# Patient Record
Sex: Male | Born: 1955 | Race: White | Hispanic: No | Smoking: Former smoker
Health system: Southern US, Community
[De-identification: ages and names within clinical notes are randomized; demographics above are authoritative.]

## PROBLEM LIST (undated history)

## (undated) DIAGNOSIS — M1990 Unspecified osteoarthritis, unspecified site: Secondary | ICD-10-CM

## (undated) DIAGNOSIS — C801 Malignant (primary) neoplasm, unspecified: Secondary | ICD-10-CM

## (undated) DIAGNOSIS — K219 Gastro-esophageal reflux disease without esophagitis: Secondary | ICD-10-CM

## (undated) DIAGNOSIS — I10 Essential (primary) hypertension: Secondary | ICD-10-CM

## (undated) DIAGNOSIS — G4730 Sleep apnea, unspecified: Secondary | ICD-10-CM

## (undated) DIAGNOSIS — E119 Type 2 diabetes mellitus without complications: Secondary | ICD-10-CM

## (undated) DIAGNOSIS — Z87442 Personal history of urinary calculi: Secondary | ICD-10-CM

## (undated) DIAGNOSIS — E7800 Pure hypercholesterolemia, unspecified: Secondary | ICD-10-CM

## (undated) HISTORY — PX: SHOULDER SURGERY: SHX246

## (undated) HISTORY — PX: ADENOIDECTOMY: SUR15

## (undated) HISTORY — PX: TONSILLECTOMY: SUR1361

## (undated) HISTORY — PX: KNEE SURGERY: SHX244

## (undated) HISTORY — DX: Type 2 diabetes mellitus without complications: E11.9

## (undated) HISTORY — PX: APPENDECTOMY: SHX54

---

## 2004-04-14 ENCOUNTER — Ambulatory Visit (HOSPITAL_COMMUNITY): Admission: RE | Admit: 2004-04-14 | Discharge: 2004-04-14 | Payer: Self-pay | Admitting: Family Medicine

## 2005-06-15 ENCOUNTER — Ambulatory Visit (HOSPITAL_COMMUNITY): Admission: RE | Admit: 2005-06-15 | Discharge: 2005-06-15 | Payer: Self-pay | Admitting: Family Medicine

## 2006-04-23 ENCOUNTER — Ambulatory Visit: Payer: Self-pay | Admitting: Orthopedic Surgery

## 2006-05-03 ENCOUNTER — Ambulatory Visit (HOSPITAL_COMMUNITY): Admission: RE | Admit: 2006-05-03 | Discharge: 2006-05-03 | Payer: Self-pay | Admitting: Orthopedic Surgery

## 2006-05-07 ENCOUNTER — Ambulatory Visit: Payer: Self-pay | Admitting: Orthopedic Surgery

## 2007-05-13 ENCOUNTER — Emergency Department (HOSPITAL_COMMUNITY): Admission: EM | Admit: 2007-05-13 | Discharge: 2007-05-13 | Payer: Self-pay | Admitting: Emergency Medicine

## 2007-07-18 ENCOUNTER — Ambulatory Visit (HOSPITAL_COMMUNITY): Admission: RE | Admit: 2007-07-18 | Discharge: 2007-07-18 | Payer: Self-pay | Admitting: Family Medicine

## 2008-02-15 ENCOUNTER — Emergency Department (HOSPITAL_COMMUNITY): Admission: EM | Admit: 2008-02-15 | Discharge: 2008-02-15 | Payer: Self-pay | Admitting: Emergency Medicine

## 2008-02-16 ENCOUNTER — Emergency Department (HOSPITAL_COMMUNITY): Admission: EM | Admit: 2008-02-16 | Discharge: 2008-02-16 | Payer: Self-pay | Admitting: Emergency Medicine

## 2008-02-18 ENCOUNTER — Emergency Department (HOSPITAL_COMMUNITY): Admission: EM | Admit: 2008-02-18 | Discharge: 2008-02-19 | Payer: Self-pay | Admitting: Emergency Medicine

## 2008-03-12 ENCOUNTER — Ambulatory Visit (HOSPITAL_COMMUNITY): Admission: RE | Admit: 2008-03-12 | Discharge: 2008-03-12 | Payer: Self-pay | Admitting: Family Medicine

## 2009-01-10 ENCOUNTER — Emergency Department (HOSPITAL_COMMUNITY): Admission: EM | Admit: 2009-01-10 | Discharge: 2009-01-10 | Payer: Self-pay | Admitting: Emergency Medicine

## 2011-01-08 ENCOUNTER — Encounter: Payer: Self-pay | Admitting: Family Medicine

## 2011-09-08 LAB — DIFFERENTIAL
Basophils Absolute: 0
Basophils Relative: 0
Eosinophils Absolute: 0
Eosinophils Relative: 0
Lymphocytes Relative: 11 — ABNORMAL LOW
Lymphs Abs: 1.1
Monocytes Absolute: 0.9
Monocytes Relative: 9
Neutro Abs: 8.2 — ABNORMAL HIGH
Neutrophils Relative %: 80 — ABNORMAL HIGH

## 2011-09-08 LAB — URINALYSIS, ROUTINE W REFLEX MICROSCOPIC
Bilirubin Urine: NEGATIVE
Glucose, UA: NEGATIVE
Ketones, ur: >80 — AB
Leukocytes, UA: NEGATIVE
Nitrite: NEGATIVE
Protein, ur: 30 — AB
Specific Gravity, Urine: 1.025
Urobilinogen, UA: 0.2
pH: 6.5

## 2011-09-08 LAB — BASIC METABOLIC PANEL
BUN: 14
CO2: 28
Calcium: 9.3
Chloride: 109
Creatinine, Ser: 1.26
GFR calc Af Amer: >60
GFR calc non Af Amer: >60
Glucose, Bld: 107 — ABNORMAL HIGH
Potassium: 4.3
Sodium: 141

## 2011-09-08 LAB — CBC
HCT: 38.9 — ABNORMAL LOW
Hemoglobin: 13.6
MCHC: 35
MCV: 87.4
Platelets: 217
RBC: 4.45
RDW: 14.3
WBC: 10.2

## 2011-09-08 LAB — URINE MICROSCOPIC-ADD ON

## 2011-09-11 LAB — CBC
HCT: 32.2 — ABNORMAL LOW
Hemoglobin: 11.2 — ABNORMAL LOW
MCHC: 34.9
MCV: 87.3
Platelets: 198
RBC: 3.69 — ABNORMAL LOW
RDW: 14.1
WBC: 8.9

## 2011-09-11 LAB — DIFFERENTIAL
Basophils Absolute: 0
Basophils Relative: 0
Eosinophils Absolute: 0.2
Eosinophils Relative: 3
Lymphocytes Relative: 17
Lymphs Abs: 1.5
Monocytes Absolute: 1.1 — ABNORMAL HIGH
Monocytes Relative: 12
Neutro Abs: 6
Neutrophils Relative %: 68

## 2011-09-11 LAB — BASIC METABOLIC PANEL
BUN: 11
CO2: 27
Calcium: 8.8
Chloride: 99
Creatinine, Ser: 1.17
GFR calc Af Amer: >60
GFR calc non Af Amer: >60
Glucose, Bld: 90
Potassium: 3.9
Sodium: 133 — ABNORMAL LOW

## 2012-02-27 ENCOUNTER — Emergency Department (HOSPITAL_COMMUNITY)
Admission: EM | Admit: 2012-02-27 | Discharge: 2012-02-28 | Disposition: A | Discharge status: Home / Self Care | Attending: Emergency Medicine | Admitting: Emergency Medicine

## 2012-02-27 ENCOUNTER — Encounter (HOSPITAL_COMMUNITY): Payer: Self-pay | Admitting: Emergency Medicine

## 2012-02-27 DIAGNOSIS — N200 Calculus of kidney: Secondary | ICD-10-CM

## 2012-02-27 DIAGNOSIS — N201 Calculus of ureter: Secondary | ICD-10-CM | POA: Insufficient documentation

## 2012-02-27 DIAGNOSIS — R112 Nausea with vomiting, unspecified: Secondary | ICD-10-CM | POA: Insufficient documentation

## 2012-02-27 DIAGNOSIS — R109 Unspecified abdominal pain: Secondary | ICD-10-CM | POA: Insufficient documentation

## 2012-02-27 MED ORDER — HYDROMORPHONE HCL PF 1 MG/ML IJ SOLN
1.0000 mg | Freq: Once | INTRAMUSCULAR | Status: AC
  Administered 2012-02-27: 1 mg via INTRAVENOUS
  Filled 2012-02-27: qty 1

## 2012-02-27 MED ORDER — ONDANSETRON HCL 4 MG/2ML IJ SOLN
4.0000 mg | Freq: Once | INTRAMUSCULAR | Status: AC
  Administered 2012-02-27: 4 mg via INTRAVENOUS
  Filled 2012-02-27: qty 2

## 2012-02-27 NOTE — ED Notes (Signed)
Pt with left flank pain x 2 hours ago

## 2012-02-27 NOTE — ED Provider Notes (Signed)
History     CSN: [id number]  Arrival date & time 02/27/12  2206   First MD Initiated Contact with Patient 02/27/12 2316      Chief Complaint  Patient presents with  . Flank Pain     Patient is a 56 y.o. male presenting with flank pain. The history is provided by the patient.  Flank Pain This is a new problem. The current episode started 1 to 2 hours ago. The problem occurs constantly. The problem has been gradually worsening. Associated symptoms include abdominal pain. Pertinent negatives include no chest pain, no headaches and no shortness of breath. The symptoms are aggravated by nothing. The symptoms are relieved by nothing.  PT presents with left flank pain with nausea/vomiting that started about 2 hrs ago He reports similar to previous h/o kidney stones in the past.  He reports he did require stent placement for previous kidney stone He has no other complaints Nothing has improved his symptoms No cp/sob reported   Past Medical History  Diagnosis Date  . Kidney calculi   . Migraine     History reviewed. No pertinent past surgical history.  No family history on file.  History  Substance Use Topics  . Smoking status: Never Smoker   . Smokeless tobacco: Not on file  . Alcohol Use: No      Review of Systems  Respiratory: Negative for shortness of breath.   Cardiovascular: Negative for chest pain.  Gastrointestinal: Positive for abdominal pain.  Genitourinary: Positive for flank pain.  Neurological: Negative for headaches.  All other systems reviewed and are negative.    Allergies  Review of patient's allergies indicates no known allergies.  Home Medications  No current outpatient prescriptions on file.  BP 123/63  Pulse 76  Temp(Src) 97.1 F (36.2 C) (Oral)  Resp 18  Ht 5\' 6"  (1.676 m)  Wt 200 lb (90.719 kg)  BMI 32.28 kg/m2  SpO2 100%  Physical Exam CONSTITUTIONAL: Well developed/well nourished, uncomfortable appearing HEAD AND FACE:  Normocephalic/atraumatic EYES: EOMI/PERRL ENMT: Mucous membranes moist NECK: supple no meningeal signs SPINE:entire spine nontender CV: S1/S2 noted, no murmurs/rubs/gallops noted LUNGS: Lungs are clear to auscultation bilaterally, no apparent distress ABDOMEN: soft, nontender, no rebound or guarding FA:OZHY CVA tenderness, no hernia noted, chaperone present NEURO: Pt is awake/alert, moves all extremitiesx4 EXTREMITIES: pulses normal, full ROM SKIN: warm, color normal PSYCH: no abnormalities of mood noted   ED Course  Procedures    Labs Reviewed  URINALYSIS, ROUTINE W REFLEX MICROSCOPIC  CBC  DIFFERENTIAL  BASIC METABOLIC PANEL   86:57 PM Pt with h/o kidney stones, now presenting with pain similar to prior episodes Labs/urinalysis ordered will treat pain   Patient signed out to dr Freida Busman pending CT imaging  MDM  Nursing notes reviewed and considered in documentation Previous records reviewed and considered All labs/vitals reviewed and considered         Joya Gaskins, MD 02/29/12 1659

## 2012-02-28 ENCOUNTER — Emergency Department (HOSPITAL_COMMUNITY)

## 2012-02-28 LAB — BASIC METABOLIC PANEL
BUN: 20 mg/dL (ref 6–23)
CO2: 24 mEq/L (ref 19–32)
Calcium: 10.1 mg/dL (ref 8.4–10.5)
Chloride: 103 mEq/L (ref 96–112)
Creatinine, Ser: 1.19 mg/dL (ref 0.50–1.35)
GFR calc Af Amer: 77 mL/min — ABNORMAL LOW (ref >90)
GFR calc non Af Amer: 67 mL/min — ABNORMAL LOW (ref >90)
Glucose, Bld: 150 mg/dL — ABNORMAL HIGH (ref 70–99)
Potassium: 3.7 mEq/L (ref 3.5–5.1)
Sodium: 138 mEq/L (ref 135–145)

## 2012-02-28 LAB — CBC
HCT: 42.7 % (ref 39.0–52.0)
Hemoglobin: 14.5 g/dL (ref 13.0–17.0)
MCH: 30 pg (ref 26.0–34.0)
MCHC: 34 g/dL (ref 30.0–36.0)
MCV: 88.2 fL (ref 78.0–100.0)
Platelets: 216 10*3/uL (ref 150–400)
RBC: 4.84 MIL/uL (ref 4.22–5.81)
RDW: 15 % (ref 11.5–15.5)
WBC: 9.9 10*3/uL (ref 4.0–10.5)

## 2012-02-28 LAB — DIFFERENTIAL
Basophils Absolute: 0 10*3/uL (ref 0.0–0.1)
Basophils Relative: 0 % (ref 0–1)
Eosinophils Absolute: 0.4 10*3/uL (ref 0.0–0.7)
Eosinophils Relative: 4 % (ref 0–5)
Lymphocytes Relative: 24 % (ref 12–46)
Lymphs Abs: 2.4 10*3/uL (ref 0.7–4.0)
Monocytes Absolute: 0.8 10*3/uL (ref 0.1–1.0)
Monocytes Relative: 8 % (ref 3–12)
Neutro Abs: 6.3 10*3/uL (ref 1.7–7.7)
Neutrophils Relative %: 64 % (ref 43–77)

## 2012-02-28 LAB — URINALYSIS, ROUTINE W REFLEX MICROSCOPIC
Bilirubin Urine: NEGATIVE
Glucose, UA: NEGATIVE mg/dL
Ketones, ur: NEGATIVE mg/dL
Leukocytes, UA: NEGATIVE
Nitrite: NEGATIVE
Protein, ur: NEGATIVE mg/dL
Specific Gravity, Urine: 1.015 (ref 1.005–1.030)
Urobilinogen, UA: 0.2 mg/dL (ref 0.0–1.0)
pH: 7 (ref 5.0–8.0)

## 2012-02-28 LAB — URINE MICROSCOPIC-ADD ON

## 2012-02-28 MED ORDER — TAMSULOSIN HCL 0.4 MG PO CAPS: 0.4000 mg | ORAL_CAPSULE | Freq: Every day | ORAL | Status: DC

## 2012-02-28 MED ORDER — ONDANSETRON HCL 4 MG PO TABS: 4.0000 mg | ORAL_TABLET | Freq: Four times a day (QID) | ORAL | Status: AC

## 2012-02-28 MED ORDER — HYDROMORPHONE HCL 2 MG PO TABS: 2.0000 mg | ORAL_TABLET | ORAL | Status: AC | PRN

## 2012-02-28 MED ORDER — HYDROMORPHONE HCL PF 1 MG/ML IJ SOLN
0.5000 mg | Freq: Once | INTRAMUSCULAR | Status: AC
  Administered 2012-02-28: 0.5 mg via INTRAVENOUS
  Filled 2012-02-28: qty 1

## 2012-02-28 NOTE — Discharge Instructions (Signed)
You have a 5 mm kidney stone on the left side causing moderate obstruction. Call your urologist today to schedule a followup visit Kidney Stones Kidney stones (ureteral lithiasis) are deposits that form inside your kidneys. The intense pain is caused by the stone moving through the urinary tract. When the stone moves, the ureter goes into spasm around the stone. The stone is usually passed in the urine.  CAUSES   A disorder that makes certain neck glands produce too much parathyroid hormone (primary hyperparathyroidism).   A buildup of uric acid crystals.   Narrowing (stricture) of the ureter.   A kidney obstruction present at birth (congenital obstruction).   Previous surgery on the kidney or ureters.   Numerous kidney infections.  SYMPTOMS   Feeling sick to your stomach (nauseous).   Throwing up (vomiting).   Blood in the urine (hematuria).   Pain that usually spreads (radiates) to the groin.   Frequency or urgency of urination.  DIAGNOSIS   Taking a history and physical exam.   Blood or urine tests.   Computerized X-ray scan (CT scan).   Occasionally, an examination of the inside of the urinary bladder (cystoscopy) is performed.  TREATMENT   Observation.   Increasing your fluid intake.   Surgery may be needed if you have severe pain or persistent obstruction.  The size, location, and chemical composition are all important variables that will determine the proper choice of action for you. Talk to your caregiver to better understand your situation so that you will minimize the risk of injury to yourself and your kidney.  HOME CARE INSTRUCTIONS   Drink enough water and fluids to keep your urine clear or pale yellow.   Strain all urine through the provided strainer. Keep all particulate matter and stones for your caregiver to see. The stone causing the pain may be as small as a grain of salt. It is very important to use the strainer each and every time you pass your  urine. The collection of your stone will allow your caregiver to analyze it and verify that a stone has actually passed.   Only take over-the-counter or prescription medicines for pain, discomfort, or fever as directed by your caregiver.   Make a follow-up appointment with your caregiver as directed.   Get follow-up X-rays if required. The absence of pain does not always mean that the stone has passed. It may have only stopped moving. If the urine remains completely obstructed, it can cause loss of kidney function or even complete destruction of the kidney. It is your responsibility to make sure X-rays and follow-ups are completed. Ultrasounds of the kidney can show blockages and the status of the kidney. Ultrasounds are not associated with any radiation and can be performed easily in a matter of minutes.  SEEK IMMEDIATE MEDICAL CARE IF:   Pain cannot be controlled with the prescribed medicine.   You have a fever.   The severity or intensity of pain increases over 18 hours and is not relieved by pain medicine.   You develop a new onset of abdominal pain.   You feel faint or pass out.  MAKE SURE YOU:   Understand these instructions.   Will watch your condition.   Will get help right away if you are not doing well or get worse.  Document Released: 12/04/2005 Document Revised: 11/23/2011 Document Reviewed: 04/01/2010 [facility] Patient Information 2012 [place], [place].

## 2012-02-28 NOTE — ED Provider Notes (Signed)
Patient signed out to me by Dr. Bebe Shaggy. Patient CT reviewed and kidney stone noted. Patient's pain is controlled this time he'll be discharged with opiate pain medication and will followup with his urologist  Toy Baker, MD 02/28/12 [phone]

## 2012-05-23 ENCOUNTER — Other Ambulatory Visit (HOSPITAL_COMMUNITY): Payer: Self-pay | Admitting: Family Medicine

## 2012-05-23 ENCOUNTER — Ambulatory Visit (HOSPITAL_COMMUNITY)
Admission: RE | Admit: 2012-05-23 | Discharge: 2012-05-23 | Disposition: A | Discharge status: Home / Self Care | Source: Ambulatory Visit | Attending: Family Medicine | Admitting: Family Medicine

## 2012-05-23 DIAGNOSIS — M25519 Pain in unspecified shoulder: Secondary | ICD-10-CM | POA: Insufficient documentation

## 2012-05-23 DIAGNOSIS — R52 Pain, unspecified: Secondary | ICD-10-CM

## 2013-04-20 ENCOUNTER — Emergency Department (HOSPITAL_COMMUNITY)

## 2013-04-20 ENCOUNTER — Inpatient Hospital Stay (HOSPITAL_COMMUNITY)
Admission: EM | Admit: 2013-04-20 | Discharge: 2013-04-29 | DRG: 981 | Disposition: A | Discharge status: Other Acute Inpatient Hospital | Attending: General Surgery | Admitting: General Surgery

## 2013-04-20 ENCOUNTER — Encounter (HOSPITAL_COMMUNITY): Payer: Self-pay | Admitting: Anesthesiology

## 2013-04-20 ENCOUNTER — Encounter (HOSPITAL_COMMUNITY)
Admission: EM | Disposition: A | Discharge status: Nursing Home (Includes ICF) | Payer: Self-pay | Source: Home / Self Care | Attending: General Surgery

## 2013-04-20 ENCOUNTER — Encounter (HOSPITAL_COMMUNITY): Payer: Self-pay

## 2013-04-20 ENCOUNTER — Emergency Department (HOSPITAL_COMMUNITY): Admitting: Anesthesiology

## 2013-04-20 DIAGNOSIS — Z87442 Personal history of urinary calculi: Secondary | ICD-10-CM

## 2013-04-20 DIAGNOSIS — N493 Fournier gangrene: Secondary | ICD-10-CM

## 2013-04-20 DIAGNOSIS — A419 Sepsis, unspecified organism: Secondary | ICD-10-CM

## 2013-04-20 DIAGNOSIS — N501 Vascular disorders of male genital organs: Principal | ICD-10-CM | POA: Diagnosis present

## 2013-04-20 DIAGNOSIS — Z79899 Other long term (current) drug therapy: Secondary | ICD-10-CM

## 2013-04-20 DIAGNOSIS — E669 Obesity, unspecified: Secondary | ICD-10-CM | POA: Diagnosis present

## 2013-04-20 DIAGNOSIS — L0501 Pilonidal cyst with abscess: Secondary | ICD-10-CM | POA: Diagnosis present

## 2013-04-20 DIAGNOSIS — F17200 Nicotine dependence, unspecified, uncomplicated: Secondary | ICD-10-CM | POA: Diagnosis present

## 2013-04-20 DIAGNOSIS — G43909 Migraine, unspecified, not intractable, without status migrainosus: Secondary | ICD-10-CM | POA: Diagnosis present

## 2013-04-20 DIAGNOSIS — M726 Necrotizing fasciitis: Secondary | ICD-10-CM | POA: Diagnosis present

## 2013-04-20 DIAGNOSIS — N179 Acute kidney failure, unspecified: Secondary | ICD-10-CM | POA: Diagnosis present

## 2013-04-20 HISTORY — PX: INCISION AND DRAINAGE PERIRECTAL ABSCESS: SHX1804

## 2013-04-20 HISTORY — PX: CENTRAL VENOUS CATHETER INSERTION: SHX401

## 2013-04-20 LAB — POCT I-STAT, CHEM 8
BUN: 41 mg/dL — ABNORMAL HIGH (ref 6–23)
Calcium, Ion: 1.24 mmol/L — ABNORMAL HIGH (ref 1.12–1.23)
Chloride: 108 mEq/L (ref 96–112)
Creatinine, Ser: 2.6 mg/dL — ABNORMAL HIGH (ref 0.50–1.35)
Glucose, Bld: 125 mg/dL — ABNORMAL HIGH (ref 70–99)
HCT: 39 % (ref 39.0–52.0)
Hemoglobin: 13.3 g/dL (ref 13.0–17.0)
Potassium: 3.9 mEq/L (ref 3.5–5.1)
Sodium: 139 mEq/L (ref 135–145)
TCO2: 22 mmol/L (ref 0–100)

## 2013-04-20 LAB — URINALYSIS, ROUTINE W REFLEX MICROSCOPIC
Glucose, UA: NEGATIVE mg/dL
Leukocytes, UA: NEGATIVE
Nitrite: NEGATIVE
Protein, ur: 30 mg/dL — AB
Specific Gravity, Urine: 1.025 (ref 1.005–1.030)
Urobilinogen, UA: 2 mg/dL — ABNORMAL HIGH (ref 0.0–1.0)
pH: 5.5 (ref 5.0–8.0)

## 2013-04-20 LAB — COMPREHENSIVE METABOLIC PANEL
ALT: 26 U/L (ref 0–53)
AST: 28 U/L (ref 0–37)
Albumin: 3.5 g/dL (ref 3.5–5.2)
Alkaline Phosphatase: 198 U/L — ABNORMAL HIGH (ref 39–117)
BUN: 46 mg/dL — ABNORMAL HIGH (ref 6–23)
CO2: 22 mEq/L (ref 19–32)
Calcium: 9.8 mg/dL (ref 8.4–10.5)
Chloride: 99 mEq/L (ref 96–112)
Creatinine, Ser: 2.66 mg/dL — ABNORMAL HIGH (ref 0.50–1.35)
GFR calc Af Amer: 29 mL/min — ABNORMAL LOW (ref >90)
GFR calc non Af Amer: 25 mL/min — ABNORMAL LOW (ref >90)
Glucose, Bld: 129 mg/dL — ABNORMAL HIGH (ref 70–99)
Potassium: 4.1 mEq/L (ref 3.5–5.1)
Sodium: 137 mEq/L (ref 135–145)
Total Bilirubin: 0.4 mg/dL (ref 0.3–1.2)
Total Protein: 7.2 g/dL (ref 6.0–8.3)

## 2013-04-20 LAB — CBC
HCT: 31.6 % — ABNORMAL LOW (ref 39.0–52.0)
Hemoglobin: 10.9 g/dL — ABNORMAL LOW (ref 13.0–17.0)
MCH: 30 pg (ref 26.0–34.0)
MCHC: 34.5 g/dL (ref 30.0–36.0)
MCV: 87.1 fL (ref 78.0–100.0)
Platelets: 156 10*3/uL (ref 150–400)
RBC: 3.63 MIL/uL — ABNORMAL LOW (ref 4.22–5.81)
RDW: 14.7 % (ref 11.5–15.5)
WBC: 27 10*3/uL — ABNORMAL HIGH (ref 4.0–10.5)

## 2013-04-20 LAB — CBC WITH DIFFERENTIAL/PLATELET
Basophils Absolute: 0 10*3/uL (ref 0.0–0.1)
Basophils Relative: 0 % (ref 0–1)
Eosinophils Absolute: 0 10*3/uL (ref 0.0–0.7)
Eosinophils Relative: 0 % (ref 0–5)
HCT: 37.8 % — ABNORMAL LOW (ref 39.0–52.0)
Hemoglobin: 13.1 g/dL (ref 13.0–17.0)
Lymphocytes Relative: 3 % — ABNORMAL LOW (ref 12–46)
Lymphs Abs: 0.8 10*3/uL (ref 0.7–4.0)
MCH: 30 pg (ref 26.0–34.0)
MCHC: 34.7 g/dL (ref 30.0–36.0)
MCV: 86.7 fL (ref 78.0–100.0)
Monocytes Absolute: 1.3 10*3/uL — ABNORMAL HIGH (ref 0.1–1.0)
Monocytes Relative: 5 % (ref 3–12)
Neutro Abs: 23.6 10*3/uL — ABNORMAL HIGH (ref 1.7–7.7)
Neutrophils Relative %: 92 % — ABNORMAL HIGH (ref 43–77)
Platelets: 172 10*3/uL (ref 150–400)
RBC: 4.36 MIL/uL (ref 4.22–5.81)
RDW: 14.4 % (ref 11.5–15.5)
WBC: 25.7 10*3/uL — ABNORMAL HIGH (ref 4.0–10.5)

## 2013-04-20 LAB — TYPE AND SCREEN
ABO/RH(D): A POS
Antibody Screen: NEGATIVE

## 2013-04-20 LAB — CREATININE, SERUM
Creatinine, Ser: 2.15 mg/dL — ABNORMAL HIGH (ref 0.50–1.35)
GFR calc Af Amer: 37 mL/min — ABNORMAL LOW (ref >90)
GFR calc non Af Amer: 32 mL/min — ABNORMAL LOW (ref >90)

## 2013-04-20 LAB — URINE MICROSCOPIC-ADD ON

## 2013-04-20 LAB — LACTIC ACID, PLASMA: Lactic Acid, Venous: 4 mmol/L — ABNORMAL HIGH (ref 0.5–2.2)

## 2013-04-20 LAB — GLUCOSE, CAPILLARY: Glucose-Capillary: 129 mg/dL — ABNORMAL HIGH (ref 70–99)

## 2013-04-20 SURGERY — INCISION AND DRAINAGE, ABSCESS, PERIRECTAL
Anesthesia: General | Wound class: Dirty or Infected

## 2013-04-20 MED ORDER — PIPERACILLIN-TAZOBACTAM 3.375 G IVPB
3.3750 g | Freq: Once | INTRAVENOUS | Status: AC
  Administered 2013-04-20: 3.375 g via INTRAVENOUS

## 2013-04-20 MED ORDER — ALBUTEROL SULFATE (5 MG/ML) 0.5% IN NEBU
2.5000 mg | INHALATION_SOLUTION | Freq: Once | RESPIRATORY_TRACT | Status: AC
  Administered 2013-04-20: 2.5 mg via RESPIRATORY_TRACT

## 2013-04-20 MED ORDER — VANCOMYCIN HCL IN DEXTROSE 1-5 GM/200ML-% IV SOLN
1000.0000 mg | Freq: Once | INTRAVENOUS | Status: AC
  Administered 2013-04-20: 1000 mg via INTRAVENOUS
  Filled 2013-04-20: qty 200

## 2013-04-20 MED ORDER — MIDAZOLAM HCL 5 MG/5ML IJ SOLN
INTRAMUSCULAR | Status: DC | PRN
  Administered 2013-04-20: 2 mg via INTRAVENOUS

## 2013-04-20 MED ORDER — FENTANYL CITRATE 0.05 MG/ML IJ SOLN
INTRAMUSCULAR | Status: DC | PRN
  Administered 2013-04-20 (×6): 50 ug via INTRAVENOUS

## 2013-04-20 MED ORDER — ALBUTEROL SULFATE (5 MG/ML) 0.5% IN NEBU
INHALATION_SOLUTION | RESPIRATORY_TRACT | Status: AC
  Filled 2013-04-20: qty 0.5

## 2013-04-20 MED ORDER — ETOMIDATE 2 MG/ML IV SOLN
INTRAVENOUS | Status: DC | PRN
  Administered 2013-04-20: 12 mg via INTRAVENOUS

## 2013-04-20 MED ORDER — PIPERACILLIN-TAZOBACTAM 3.375 G IVPB
3.3750 g | Freq: Once | INTRAVENOUS | Status: DC
  Filled 2013-04-20: qty 50

## 2013-04-20 MED ORDER — HYDROMORPHONE HCL PF 1 MG/ML IJ SOLN
1.0000 mg | INTRAMUSCULAR | Status: DC | PRN
  Administered 2013-04-20 – 2013-04-21 (×2): 1 mg via INTRAVENOUS
  Administered 2013-04-21 (×2): 2 mg via INTRAVENOUS
  Administered 2013-04-22: 1 mg via INTRAVENOUS
  Administered 2013-04-22 – 2013-04-23 (×2): 2 mg via INTRAVENOUS
  Administered 2013-04-23 – 2013-04-24 (×6): 1 mg via INTRAVENOUS
  Administered 2013-04-25: 2 mg via INTRAVENOUS
  Administered 2013-04-25 (×7): 1 mg via INTRAVENOUS
  Administered 2013-04-26 (×3): 2 mg via INTRAVENOUS
  Administered 2013-04-26: 1 mg via INTRAVENOUS
  Administered 2013-04-26 – 2013-04-29 (×16): 2 mg via INTRAVENOUS
  Filled 2013-04-20 (×3): qty 2
  Filled 2013-04-20 (×3): qty 1
  Filled 2013-04-20 (×6): qty 2
  Filled 2013-04-20: qty 1
  Filled 2013-04-20: qty 2
  Filled 2013-04-20 (×2): qty 1
  Filled 2013-04-20 (×2): qty 2
  Filled 2013-04-20: qty 1
  Filled 2013-04-20: qty 2
  Filled 2013-04-20: qty 1
  Filled 2013-04-20 (×5): qty 2
  Filled 2013-04-20: qty 1
  Filled 2013-04-20 (×4): qty 2
  Filled 2013-04-20 (×2): qty 1
  Filled 2013-04-20 (×3): qty 2
  Filled 2013-04-20 (×3): qty 1
  Filled 2013-04-20 (×2): qty 2

## 2013-04-20 MED ORDER — SODIUM CHLORIDE 0.9 % IV BOLUS (SEPSIS)
1000.0000 mL | Freq: Once | INTRAVENOUS | Status: AC
  Administered 2013-04-20: 1000 mL via INTRAVENOUS

## 2013-04-20 MED ORDER — LIDOCAINE HCL (CARDIAC) 10 MG/ML IV SOLN
INTRAVENOUS | Status: DC | PRN
  Administered 2013-04-20: 50 mg via INTRAVENOUS

## 2013-04-20 MED ORDER — ENOXAPARIN SODIUM 40 MG/0.4ML ~~LOC~~ SOLN: 40.0000 mg | SUBCUTANEOUS | Status: DC

## 2013-04-20 MED ORDER — LACTATED RINGERS IV SOLN
INTRAVENOUS | Status: DC
  Administered 2013-04-20: 05:00:00 via INTRAVENOUS

## 2013-04-20 MED ORDER — PIPERACILLIN-TAZOBACTAM 3.375 G IVPB: 3.3750 g | Freq: Once | INTRAVENOUS | Status: DC

## 2013-04-20 MED ORDER — PIPERACILLIN-TAZOBACTAM 3.375 G IVPB
3.3750 g | Freq: Three times a day (TID) | INTRAVENOUS | Status: DC
  Administered 2013-04-20 – 2013-04-26 (×18): 3.375 g via INTRAVENOUS
  Filled 2013-04-20 (×24): qty 50

## 2013-04-20 MED ORDER — PREGABALIN 50 MG PO CAPS
75.0000 mg | ORAL_CAPSULE | Freq: Two times a day (BID) | ORAL | Status: DC
  Administered 2013-04-20 – 2013-04-29 (×18): 75 mg via ORAL
  Filled 2013-04-20 (×18): qty 1

## 2013-04-20 MED ORDER — ALBUTEROL SULFATE HFA 108 (90 BASE) MCG/ACT IN AERS
INHALATION_SPRAY | RESPIRATORY_TRACT | Status: DC | PRN
  Administered 2013-04-20: 2 via RESPIRATORY_TRACT

## 2013-04-20 MED ORDER — NEOSTIGMINE METHYLSULFATE 1 MG/ML IJ SOLN
INTRAMUSCULAR | Status: DC | PRN
  Administered 2013-04-20: 4 mg via INTRAVENOUS

## 2013-04-20 MED ORDER — SODIUM CHLORIDE 0.9 % IJ SOLN
INTRAMUSCULAR | Status: DC | PRN
  Administered 2013-04-20: 10 mL via INTRAVENOUS

## 2013-04-20 MED ORDER — LACTATED RINGERS IV SOLN
INTRAVENOUS | Status: DC | PRN
  Administered 2013-04-20 (×3): via INTRAVENOUS

## 2013-04-20 MED ORDER — PIPERACILLIN-TAZOBACTAM 3.375 G IVPB
INTRAVENOUS | Status: AC
  Filled 2013-04-20: qty 150

## 2013-04-20 MED ORDER — HYDROMORPHONE HCL PF 1 MG/ML IJ SOLN
1.0000 mg | Freq: Once | INTRAMUSCULAR | Status: AC
  Administered 2013-04-20: 1 mg via INTRAVENOUS
  Filled 2013-04-20: qty 1

## 2013-04-20 MED ORDER — VANCOMYCIN HCL 10 G IV SOLR
1250.0000 mg | INTRAVENOUS | Status: DC
  Administered 2013-04-21 – 2013-04-24 (×4): 1250 mg via INTRAVENOUS
  Filled 2013-04-20 (×4): qty 1250

## 2013-04-20 MED ORDER — ROCURONIUM BROMIDE 100 MG/10ML IV SOLN
INTRAVENOUS | Status: DC | PRN
  Administered 2013-04-20: 10 mg via INTRAVENOUS

## 2013-04-20 MED ORDER — GLYCOPYRROLATE 0.2 MG/ML IJ SOLN
INTRAMUSCULAR | Status: DC | PRN
Start: 2013-04-20 — End: 2013-04-20
  Administered 2013-04-20: 0.6 mg via INTRAVENOUS

## 2013-04-20 MED ORDER — ONDANSETRON HCL 4 MG/2ML IJ SOLN
4.0000 mg | Freq: Four times a day (QID) | INTRAMUSCULAR | Status: DC | PRN
  Administered 2013-04-23: 4 mg via INTRAVENOUS
  Filled 2013-04-20: qty 2

## 2013-04-20 MED ORDER — SODIUM CHLORIDE 0.9 % IR SOLN
Status: DC | PRN
  Administered 2013-04-20: 1000 mL
  Administered 2013-04-20: 3000 mL

## 2013-04-20 MED ORDER — ESCITALOPRAM OXALATE 10 MG PO TABS
10.0000 mg | ORAL_TABLET | Freq: Every day | ORAL | Status: DC
  Administered 2013-04-20 – 2013-04-29 (×10): 10 mg via ORAL
  Filled 2013-04-20 (×10): qty 1

## 2013-04-20 MED ORDER — SUCCINYLCHOLINE CHLORIDE 20 MG/ML IJ SOLN
INTRAMUSCULAR | Status: DC | PRN
  Administered 2013-04-20: 120 mg via INTRAVENOUS

## 2013-04-20 MED ORDER — ENOXAPARIN SODIUM 40 MG/0.4ML ~~LOC~~ SOLN: 40.0000 mg | Freq: Once | SUBCUTANEOUS | Status: DC

## 2013-04-20 MED ORDER — PANTOPRAZOLE SODIUM 40 MG IV SOLR
40.0000 mg | Freq: Every day | INTRAVENOUS | Status: DC
  Administered 2013-04-20 – 2013-04-23 (×4): 40 mg via INTRAVENOUS
  Filled 2013-04-20 (×4): qty 40

## 2013-04-20 SURGICAL SUPPLY — 28 items
BAG HAMPER (MISCELLANEOUS) ×3 IMPLANT
BANDAGE GAUZE ELAST BULKY 4 IN (GAUZE/BANDAGES/DRESSINGS) ×6 IMPLANT
CLOTH BEACON ORANGE TIMEOUT ST (SAFETY) ×3 IMPLANT
COVER LIGHT HANDLE STERIS (MISCELLANEOUS) ×6 IMPLANT
COVER MAYO STAND XLG (DRAPE) ×6 IMPLANT
DRSG TEGADERM 4X4.75 (GAUZE/BANDAGES/DRESSINGS) ×3 IMPLANT
ELECT REM PT RETURN 9FT ADLT (ELECTROSURGICAL) ×3
ELECTRODE REM PT RTRN 9FT ADLT (ELECTROSURGICAL) ×2 IMPLANT
FORMALIN 10 PREFIL 480ML (MISCELLANEOUS) ×3 IMPLANT
GLOVE BIOGEL PI IND STRL 7.5 (GLOVE) ×6 IMPLANT
GLOVE BIOGEL PI INDICATOR 7.5 (GLOVE) ×3
GLOVE ECLIPSE 7.0 STRL STRAW (GLOVE) ×9 IMPLANT
GLOVE INDICATOR 7.0 STRL GRN (GLOVE) ×3 IMPLANT
GOWN STRL REIN XL XLG (GOWN DISPOSABLE) ×9 IMPLANT
HANDPIECE INTERPULSE COAX TIP (DISPOSABLE) ×1
IV NS IRRIG 3000ML ARTHROMATIC (IV SOLUTION) ×3 IMPLANT
KIT CV MULTI LUMEN 7FRX20CM (SET/KITS/TRAYS/PACK) ×3 IMPLANT
KIT ROOM TURNOVER APOR (KITS) ×3 IMPLANT
MANIFOLD NEPTUNE II (INSTRUMENTS) ×3 IMPLANT
PACK PERI GYN (CUSTOM PROCEDURE TRAY) IMPLANT
PAD ABD 5X9 TENDERSORB (GAUZE/BANDAGES/DRESSINGS) ×6 IMPLANT
PAD ARMBOARD 7.5X6 YLW CONV (MISCELLANEOUS) ×3 IMPLANT
SET BASIN LINEN APH (SET/KITS/TRAYS/PACK) ×3 IMPLANT
SET HNDPC FAN SPRY TIP SCT (DISPOSABLE) ×2 IMPLANT
SWAB CULTURE LIQ STUART DBL (MISCELLANEOUS) IMPLANT
SYR BULB IRRIGATION 50ML (SYRINGE) ×3 IMPLANT
TAPE CLOTH SURG 4X10 WHT LF (GAUZE/BANDAGES/DRESSINGS) ×3 IMPLANT
TUBE ANAEROBIC PORT A CUL  W/M (MISCELLANEOUS) ×3 IMPLANT

## 2013-04-20 NOTE — Anesthesia Procedure Notes (Signed)
Procedure Name: Intubation Date/Time: 04/20/2013 2:10 PM Performed by: Carolyne Littles, Jaylea Plourde L Pre-anesthesia Checklist: Patient identified, Patient being monitored, Timeout performed, Emergency Drugs available and Suction available Patient Re-evaluated:Patient Re-evaluated prior to inductionOxygen Delivery Method: Circle System Utilized Preoxygenation: Pre-oxygenation with 100% oxygen Intubation Type: IV induction, Rapid sequence and Cricoid Pressure applied Laryngoscope Size: 3 and Miller Grade View: Grade I Tube type: Oral Tube size: 7.0 mm Number of attempts: 1 Airway Equipment and Method: stylet Placement Confirmation: ETT inserted through vocal cords under direct vision,  positive ETCO2 and breath sounds checked- equal and bilateral Secured at: 21 cm Tube secured with: Tape Dental Injury: Teeth and Oropharynx as per pre-operative assessment

## 2013-04-20 NOTE — Progress Notes (Signed)
Talked with Teresita Madura. Pt work boss. Informed Mr Neale Burly that pt had emergency surgery and would not be in to work tonight. Voiced understanding.

## 2013-04-20 NOTE — ED Provider Notes (Signed)
History  This chart was scribed for Shawn Roller, MD by Shari Heritage, ED Scribe. The patient was seen in room APA14/APA14. Patient's care was started at 0941.   CSN: [id number]  Arrival date & time 04/20/13  0930   First MD Initiated Contact with Patient 04/20/13 [phone]      Chief Complaint  Patient presents with  . Abscess     The history is provided by the patient. No language interpreter was used.    HPI Comments: Shawn Fleming is a 57 y.o. male who presents to the Emergency Department complaining of worsening, painful, erythematous abscesses to the buttocks onset more than 3 days ago. He rates pain as 9/10 currently. Patient reports that he was diagnosed with a pilonidal cyst on Friday and was prescribed an antibiotic that he has been taking as instructed. Patient cannot recall the name of the medicine. He states that infection appears to be spreading and he is unable to tolerate sitting. Patient says that he woke up at about 7 am this morning and was able to tolerate solids and fluids at breakfast. He also took hydrocodone 10 mg prior to arrival. Patient has diaphoresis at present, but states this is not unusual. He denies fever, nausea, vomiting or any other symptoms at this time. He has a medical history of kidney calculi and migraines and a surgical history of appendectomy. Patient has no known history of diabetes.   PCP - DonDiego  Past Medical History  Diagnosis Date  . Kidney calculi   . Migraine     Past Surgical History  Procedure Laterality Date  . Shoulder surgery    . Knee surgery    . Appendectomy    . Tonsillectomy    . Adenoidectomy      No family history on file.  History  Substance Use Topics  . Smoking status: Current Every Day Smoker  . Smokeless tobacco: Not on file  . Alcohol Use: No      Review of Systems A complete 10 system review of systems was obtained and all systems are negative except as noted in the HPI and PMH.  Allergies  Review  of patient's allergies indicates no known allergies.  Home Medications   Current Outpatient Rx  Name  Route  Sig  Dispense  Refill  . alfuzosin (UROXATRAL) 10 MG 24 hr tablet   Oral   Take 10 mg by mouth daily.         Marland Kitchen escitalopram (LEXAPRO) 10 MG tablet   Oral   Take 10 mg by mouth daily.         Marland Kitchen esomeprazole (NEXIUM) 40 MG capsule   Oral   Take 40 mg by mouth daily.         Marland Kitchen levofloxacin (LEVAQUIN) 750 MG tablet   Oral   Take 750 mg by mouth daily.         . naproxen (NAPROSYN) 500 MG tablet   Oral   Take 500 mg by mouth 2 (two) times daily.         . Oxycodone HCl 10 MG TABS   Oral   Take 10 mg by mouth 3 (three) times daily.         . pregabalin (LYRICA) 75 MG capsule   Oral   Take 75 mg by mouth 2 (two) times daily.         . rosuvastatin (CRESTOR) 10 MG tablet   Oral   Take 10 mg by mouth  daily.         . topiramate (TOPAMAX) 200 MG tablet   Oral   Take 200 mg by mouth at bedtime.         . verapamil (CALAN-SR) 240 MG CR tablet   Oral   Take 240 mg by mouth at bedtime.           Triage Vitals: BP 97/60  Pulse 113  Temp(Src) 98.2 F (36.8 C) (Oral)  Resp 20  Ht 5\' 6"  (1.676 m)  Wt 212 lb (96.163 kg)  BMI 34.23 kg/m2  SpO2 96%  Physical Exam  Constitutional: He appears well-developed and well-nourished.  HENT:  Head: Normocephalic and atraumatic.  Mouth/Throat: Oropharynx is clear and moist.  Eyes: Conjunctivae and EOM are normal. Pupils are equal, round, and reactive to light.  Neck: Normal range of motion. Neck supple.  Cardiovascular: Regular rhythm and normal heart sounds.  Tachycardia present.   No murmur heard. Hypotension.  Pulmonary/Chest: Effort normal and breath sounds normal. No respiratory distress. He has no wheezes. He has no rales.  Abdominal: Soft. He exhibits no distension and no mass. There is no tenderness. There is no rebound and no guarding.  Musculoskeletal:  No lower extremity edema.   Neurological:  Clear sensorium. Follows commands. Clear speech. Antalgic gait secondary to pain.   Skin: There is erythema.  Indurated, fluctuant abscesses to his bilateral buttocks in the perianal area as well as extending cellulitis on the right glut. Erythema and tenderness extending into the perineum and onto the posterior scrotum.     ED Course  Procedures (including critical care time) DIAGNOSTIC STUDIES: Oxygen Saturation is 96% on room air, adequate by my interpretation.    COORDINATION OF CARE: 9:57 AM- Patient informed of current plan for treatment and evaluation and agrees with plan at this time.      Labs Reviewed  CBC WITH DIFFERENTIAL - Abnormal; Notable for the following:    WBC 25.7 (*)    HCT 37.8 (*)    Neutrophils Relative 92 (*)    Lymphocytes Relative 3 (*)    Neutro Abs 23.6 (*)    Monocytes Absolute 1.3 (*)    All other components within normal limits  COMPREHENSIVE METABOLIC PANEL - Abnormal; Notable for the following:    Glucose, Bld 129 (*)    BUN 46 (*)    Creatinine, Ser 2.66 (*)    Alkaline Phosphatase 198 (*)    GFR calc non Af Amer 25 (*)    GFR calc Af Amer 29 (*)    All other components within normal limits  LACTIC ACID, PLASMA - Abnormal; Notable for the following:    Lactic Acid, Venous 4.0 (*)    All other components within normal limits  URINALYSIS, ROUTINE W REFLEX MICROSCOPIC - Abnormal; Notable for the following:    APPearance HAZY (*)    Hgb urine dipstick SMALL (*)    Bilirubin Urine SMALL (*)    Ketones, ur TRACE (*)    Protein, ur 30 (*)    Urobilinogen, UA 2.0 (*)    All other components within normal limits  GLUCOSE, CAPILLARY - Abnormal; Notable for the following:    Glucose-Capillary 129 (*)    All other components within normal limits  URINE MICROSCOPIC-ADD ON - Abnormal; Notable for the following:    Bacteria, UA MANY (*)    All other components within normal limits  POCT I-STAT, CHEM 8 - Abnormal; Notable for the  following:  BUN 41 (*)    Creatinine, Ser 2.60 (*)    Glucose, Bld 125 (*)    Calcium, Ion 1.24 (*)    All other components within normal limits  CULTURE, BLOOD (ROUTINE X 2)  CULTURE, BLOOD (ROUTINE X 2)  URINE CULTURE   Ct Abdomen Pelvis Wo Contrast  04/20/2013  *RADIOLOGY REPORT*  Clinical Data: Worsening perirectal pain  CT ABDOMEN AND PELVIS WITHOUT CONTRAST  Technique:  Multidetector CT imaging of the abdomen and pelvis was performed following the standard protocol without intravenous contrast.  Comparison: 02/28/2012  Findings: Lung bases are unremarkable. Study is limited without IV contrast.  Sagittal images of the spine shows mild reactive changes thoracolumbar spine.  Unenhanced liver, spleen, pancreas and adrenals are unremarkable.  Again noted fatty replacement of pancreatic head region.  Stable chronic mild stranding of mesenteric fat surrounding the root of mesentery.  Moderate colonic stool.  Colonic diverticula are noted without evidence of acute diverticulitis.  No small bowel or colonic obstruction.  No pericecal inflammation.  The terminal ileum is unremarkable. Punctate nonobstructive renal calcified calculi are noted bilaterally. No hydronephrosis or hydroureter.  No calcified ureteral calculi are noted.  The urinary bladder is unremarkable. Prostate gland small calcification is noted.  There is a left inguinal scrotal canal hernia containing fat measures about 3 cm without evidence of acute complication.  No pelvic ascites or adenopathy.  Bilateral nonspecific inguinal lymph nodes are noted.  There is small amount of soft tissue air in the perianal region. There is mild skin thickening in the perianal region.  There is subcutaneous stranding in mid perineum superficial.  Axial image 101 there is  subcutaneous air in mid perineum extending in the left inferior aspect of the scrotal sac.  Findings are highly suspicious for cellulitis with gas-forming bacteria.  No drainable abscess  is noted.  A surgical clip is noted in the left scrotal sac.  There is some skin thickening in the inferior scrotum.  Clinical correlation is necessary.  IMPRESSION:  1.  No hydronephrosis or hydroureter.  Probable nonobstructive nephrolithiasis. 2.  No calcified ureteral calculi. 3.  Colonic diverticula without evidence of acute diverticulitis. 4.  There is soft tissue  air in perianal region.  Soft tissue air and subcutaneous stranding noted mid perineum extending in the left inferior scrotal sac. Findings are highly suspicious for cellulitis with gas-forming bacteria.  Clinical correlation is necessary.  No drainable abscess is noted.  Clinical correlation is necessary to exclude  necrotizing fasciitis or Fournier gangrene.   Original Report Authenticated By: Natasha Mead, M.D.      1. Severe sepsis   2. Fournier's gangrene   3. Acute renal failure       MDM  The patient has a significant infection of his perineal area clinically, he is hypotensive, tachycardic and has a white blood cell count of 25,000 and a lactic acid of 4 confirming that the patient is in sepsis from this infection. He has significant tenderness but because of his hypotension and tach pain medications have been given in moderation. He has required IV fluid boluses for his hypotension and a stat CT scan without contrast (due 2 renal failure) shows that he likely has a necrotizing fasciitis or a fournier's gangrene causing his symptoms. Stat consultation with surgery obtained, they will come to see him immediately.  The patient has been given 2 large bore IVs, IV fluid bolus x2, mild improvement in blood pressure, broad-spectrum antibiotics with vancomycin and Zosyn have been  ordered and given, the patient's mental status has been maintained, he has been explained his findings, the patient is critically ill, critical care delivered for this fracture patient.  ED ECG REPORT  I personally interpreted this EKG   Date: 04/20/2013    Rate: 97  Rhythm: normal sinus rhythm  QRS Axis: normal  Intervals: normal  ST/T Wave abnormalities: normal  Conduction Disutrbances:none  Narrative Interpretation:   Old EKG Reviewed: none available   CRITICAL CARE Performed by: Shawn Fleming   Total critical care time: 35  Critical care time was exclusive of separately billable procedures and treating other patients.  Critical care was necessary to treat or prevent imminent or life-threatening deterioration.  Critical care was time spent personally by me on the following activities: development of treatment plan with patient and/or surrogate as well as nursing, discussions with consultants, evaluation of patient's response to treatment, examination of patient, obtaining history from patient or surrogate, ordering and performing treatments and interventions, ordering and review of laboratory studies, ordering and review of radiographic studies, pulse oximetry and re-evaluation of patient's condition.     I personally performed the services described in this documentation, which was scribed in my presence. The recorded information has been reviewed and is accurate.     Shawn Roller, MD 04/20/13 1328

## 2013-04-20 NOTE — Preoperative (Signed)
Beta Blockers   Reason not to administer Beta Blockers:Not Applicable 

## 2013-04-20 NOTE — Progress Notes (Signed)
Belongings at bedside

## 2013-04-20 NOTE — H&P (Signed)
Shawn Fleming is an 57 y.o. male.   Chief Complaint: Pilonidal abscess HPI: Patient presented to [facility] emergency department with worsening perirectal and perineal pain. Patient states he was told he had a pilonidal abscess several days ago by his PCP. 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 Several days ago he noted increasing pain along the pre-sacral, pre-coccygeal region. He states it felt as though he had fallen on his tailbone but denies any trauma. He has had some chills but no subjective fevers. Slight nausea but no emesis. No change in bowel movements. No change with urination. Pain progressed and included the perineum and scrotum. He denies any significant discharge. Again he denies any trauma either from fall or rectal trauma. No sick contacts. No recent steroid usage. No change in medications  Past Medical History  Diagnosis Date  . Kidney calculi   . Migraine     Past Surgical History  Procedure Laterality Date  . Shoulder surgery    . Knee surgery    . Appendectomy    . Tonsillectomy    . Adenoidectomy      No family history on file. Social History:  reports that he has been smoking.  He does not have any smokeless tobacco history on file. He reports that he does not drink alcohol or use illicit drugs.  Allergies: No Known Allergies   (Not in a hospital admission)  Results for orders placed during the hospital encounter of 04/20/13 (from the past 48 hour(s))  GLUCOSE, CAPILLARY     Status: Abnormal   Collection Time    04/20/13 10:04 AM      Result Value Range   Glucose-Capillary 129 (*) 70 - 99 mg/dL  CBC WITH DIFFERENTIAL     Status: Abnormal   Collection Time    04/20/13 10:10 AM      Result Value Range   WBC 25.7 (*) 4.0 - 10.5 K/uL   RBC 4.36  4.22 - 5.81 MIL/uL   Hemoglobin 13.1  13.0 - 17.0 g/dL   HCT 16.1 (*) 09.6 - 04.5 %   MCV 86.7  78.0 - 100.0 fL   MCH 30.0  26.0 - 34.0 pg   MCHC 34.7   30.0 - 36.0 g/dL   RDW 40.9  81.1 - 91.4 %   Platelets 172  150 - 400 K/uL   Neutrophils Relative 92 (*) 43 - 77 %   Lymphocytes Relative 3 (*) 12 - 46 %   Monocytes Relative 5  3 - 12 %   Eosinophils Relative 0  0 - 5 %   Basophils Relative 0  0 - 1 %   Neutro Abs 23.6 (*) 1.7 - 7.7 K/uL   Lymphs Abs 0.8  0.7 - 4.0 K/uL   Monocytes Absolute 1.3 (*) 0.1 - 1.0 K/uL   Eosinophils Absolute 0.0  0.0 - 0.7 K/uL   Basophils Absolute 0.0  0.0 - 0.1 K/uL   WBC Morphology WHITE COUNT CONFIRMED ON SMEAR     Comment: INCREASED BANDS (>20% BANDS)  COMPREHENSIVE METABOLIC PANEL     Status: Abnormal   Collection Time    04/20/13 10:10 AM      Result Value Range   Sodium 137  135 - 145 mEq/L   Potassium 4.1  3.5 - 5.1 mEq/L   Chloride 99  96 - 112 mEq/L   CO2 22  19 - 32 mEq/L   Glucose, Bld 129 (*) 70 -  99 mg/dL   BUN 46 (*) 6 - 23 mg/dL   Creatinine, Ser 4.09 (*) 0.50 - 1.35 mg/dL   Calcium 9.8  8.4 - 81.1 mg/dL   Total Protein 7.2  6.0 - 8.3 g/dL   Albumin 3.5  3.5 - 5.2 g/dL   AST 28  0 - 37 U/L   ALT 26  0 - 53 U/L   Alkaline Phosphatase 198 (*) 39 - 117 U/L   Total Bilirubin 0.4  0.3 - 1.2 mg/dL   GFR calc non Af Amer 25 (*) >90 mL/min   GFR calc Af Amer 29 (*) >90 mL/min   Comment:            The eGFR has been calculated     using the CKD EPI equation.     This calculation has not been     validated in all clinical     situations.     eGFR's persistently     <90 mL/min signify     possible Chronic Kidney Disease.  LACTIC ACID, PLASMA     Status: Abnormal   Collection Time    04/20/13 10:10 AM      Result Value Range   Lactic Acid, Venous 4.0 (*) 0.5 - 2.2 mmol/L  POCT I-STAT, CHEM 8     Status: Abnormal   Collection Time    04/20/13 10:13 AM      Result Value Range   Sodium 139  135 - 145 mEq/L   Potassium 3.9  3.5 - 5.1 mEq/L   Chloride 108  96 - 112 mEq/L   BUN 41 (*) 6 - 23 mg/dL   Creatinine, Ser 9.14 (*) 0.50 - 1.35 mg/dL   Glucose, Bld 782 (*) 70 - 99 mg/dL    Calcium, Ion 9.56 (*) 1.12 - 1.23 mmol/L   TCO2 22  0 - 100 mmol/L   Hemoglobin 13.3  13.0 - 17.0 g/dL   HCT 21.3  08.6 - 57.8 %  CULTURE, BLOOD (ROUTINE X 2)     Status: None   Collection Time    04/20/13 10:15 AM      Result Value Range   Specimen Description BLOOD BLOOD RIGHT ARM     Special Requests BOTTLES DRAWN AEROBIC AND ANAEROBIC 8CC EACH     Culture PENDING     Report Status PENDING    CULTURE, BLOOD (ROUTINE X 2)     Status: None   Collection Time    04/20/13 10:35 AM      Result Value Range   Specimen Description BLOOD BLOOD LEFT HAND     Special Requests BOTTLES DRAWN AEROBIC AND ANAEROBIC 8CC     Culture PENDING     Report Status PENDING    URINALYSIS, ROUTINE W REFLEX MICROSCOPIC     Status: Abnormal   Collection Time    04/20/13 11:07 AM      Result Value Range   Color, Urine YELLOW  YELLOW   APPearance HAZY (*) CLEAR   Specific Gravity, Urine 1.025  1.005 - 1.030   pH 5.5  5.0 - 8.0   Glucose, UA NEGATIVE  NEGATIVE mg/dL   Hgb urine dipstick SMALL (*) NEGATIVE   Bilirubin Urine SMALL (*) NEGATIVE   Ketones, ur TRACE (*) NEGATIVE mg/dL   Protein, ur 30 (*) NEGATIVE mg/dL   Urobilinogen, UA 2.0 (*) 0.0 - 1.0 mg/dL   Nitrite NEGATIVE  NEGATIVE   Leukocytes, UA NEGATIVE  NEGATIVE  URINE MICROSCOPIC-ADD ON  Status: Abnormal   Collection Time    04/20/13 11:07 AM      Result Value Range   Squamous Epithelial / LPF RARE  RARE   WBC, UA 3-6  <3 WBC/hpf   RBC / HPF 3-6  <3 RBC/hpf   Bacteria, UA MANY (*) RARE   Ct Abdomen Pelvis Wo Contrast  04/20/2013  *RADIOLOGY REPORT*  Clinical Data: Worsening perirectal pain  CT ABDOMEN AND PELVIS WITHOUT CONTRAST  Technique:  Multidetector CT imaging of the abdomen and pelvis was performed following the standard protocol without intravenous contrast.  Comparison: 02/28/2012  Findings: Lung bases are unremarkable. Study is limited without IV contrast.  Sagittal images of the spine shows mild reactive changes thoracolumbar  spine.  Unenhanced liver, spleen, pancreas and adrenals are unremarkable.  Again noted fatty replacement of pancreatic head region.  Stable chronic mild stranding of mesenteric fat surrounding the root of mesentery.  Moderate colonic stool.  Colonic diverticula are noted without evidence of acute diverticulitis.  No small bowel or colonic obstruction.  No pericecal inflammation.  The terminal ileum is unremarkable. Punctate nonobstructive renal calcified calculi are noted bilaterally. No hydronephrosis or hydroureter.  No calcified ureteral calculi are noted.  The urinary bladder is unremarkable. Prostate gland small calcification is noted.  There is a left inguinal scrotal canal hernia containing fat measures about 3 cm without evidence of acute complication.  No pelvic ascites or adenopathy.  Bilateral nonspecific inguinal lymph nodes are noted.  There is small amount of soft tissue air in the perianal region. There is mild skin thickening in the perianal region.  There is subcutaneous stranding in mid perineum superficial.  Axial image 101 there is  subcutaneous air in mid perineum extending in the left inferior aspect of the scrotal sac.  Findings are highly suspicious for cellulitis with gas-forming bacteria.  No drainable abscess is noted.  A surgical clip is noted in the left scrotal sac.  There is some skin thickening in the inferior scrotum.  Clinical correlation is necessary.  IMPRESSION:  1.  No hydronephrosis or hydroureter.  Probable nonobstructive nephrolithiasis. 2.  No calcified ureteral calculi. 3.  Colonic diverticula without evidence of acute diverticulitis. 4.  There is soft tissue  air in perianal region.  Soft tissue air and subcutaneous stranding noted mid perineum extending in the left inferior scrotal sac. Findings are highly suspicious for cellulitis with gas-forming bacteria.  Clinical correlation is necessary.  No drainable abscess is noted.  Clinical correlation is necessary to exclude   necrotizing fasciitis or Fournier gangrene.   Original Report Authenticated By: Natasha Mead, M.D.     Review of Systems  Constitutional: Positive for chills and diaphoresis.  HENT: Negative.   Eyes: Negative.   Respiratory: Positive for shortness of breath (occasional) and wheezing (occasional).   Cardiovascular: Negative.   Gastrointestinal: Positive for nausea. Negative for vomiting, abdominal pain, diarrhea, constipation, blood in stool and melena.  Genitourinary: Negative.   Musculoskeletal: Negative.   Skin:       As per history of present illness  Neurological: Negative.   Endo/Heme/Allergies: Negative.   Psychiatric/Behavioral: Negative.     Blood pressure 94/65, pulse 100, temperature 98.2 F (36.8 C), temperature source Oral, resp. rate 18, height 5\' 6"  (1.676 m), weight 96.163 kg (212 lb), SpO2 97.00%. Physical Exam  Constitutional: He appears well-developed and well-nourished. No distress.  Obese, uncomfortable  HENT:  Head: Normocephalic and atraumatic.  Eyes: Conjunctivae and EOM are normal. Pupils are equal, round, and  reactive to light. No scleral icterus.  Neck: Normal range of motion. Neck supple. No tracheal deviation present.  Cardiovascular: Normal rate, regular rhythm and normal heart sounds.   Respiratory: Effort normal and breath sounds normal. No respiratory distress.  GI: Soft. Bowel sounds are normal. He exhibits no distension and no mass. There is no tenderness. There is no rebound and no guarding.  Genitourinary:        Lymphadenopathy:    He has no cervical adenopathy.     Assessment/Plan Fournier's gangrene. Extensive debridement discussed with patient. Initial debridement will be undertaken emergently today. Additional interventions may be required and this is been discussed with the patient. Continue IV antibiotics. Continue IV fluid hydration. Continue n.p.o. DVT prophylaxis. Proceed emergently to the operating room for sharp surgical  debridement.  Auston Halfmann C 04/20/2013, 1:33 PM

## 2013-04-20 NOTE — Progress Notes (Signed)
Albuterol aerosol tx done per resp therapy. Tolerated well.

## 2013-04-20 NOTE — Op Note (Addendum)
Patient:  Shawn Fleming  DOB:  July 01, 1956  MRN:  [id number]   Preop Diagnosis:   Fournier's gangrene  Postop Diagnosis:  The same  Procedure:  Extensive sharp surgical debridement of the perirectal, perineal, and scrotal tissue.  Surgeon:  Dr. Tilford Pillar  Anes:  General endotracheal  Indications:  Patient presented to [facility] with increasing pain in drainage from the perirectal area. Patient initially for a pilonidal cyst. On evaluation in the emergency department he was noted the patient had a necrotizing fasciitis with documented subcuticular air on a CT of the pelvis. Evaluation was consistent for Fournier's gangrene and patient was taken to the operating room for emergent debridement.  Procedure note:  Patient was taken to the operating room was placed in the supine position the or table time the general anesthetic is a Optician, dispensing. Once patient was asleep he was endotracheally intubated by the nurse anesthetist. At this point a Foley catheter is placed in standard sterile fashion by the operative staff. He is placed into bilateral yellow fin stirrups and placed into a high without any position. His perineum and rectum and gluteal area was prepped with Betadine solution and draped in standard fashion. At this point I used electrocautery to create the initial incision around a draining draining sinus. I immediately encountered foul-smelling turbid fluid which was cultured. I was easily able to digitally dissect along the tissue plane anteriorly and right of the rectum and anus. At this point. A counter incision right to the anal opening. Extensive necrotic tissue was encountered and excised sharply. Again I was easily able to continued the dissection with a combination of sharp and digital dissection anteriorly into the perineum. Again a counter incision was created in the perineum and additional extensive dissection was carried out. I then carried dissection again along the left  aspect of the anus and rectum with again sharp and digital dissection again creating a counter incision lateral to the anal opening. This dissection carried further posterior and a additional counter incision was created at this point. Having felt a cleared most of the necrotic tissue and did not identify any in digital areas of tracking I continued my dissection anteriorly after placing lap sponges into the wounds to 2 on any venous bleeding. At this point I continued anteriorly creating a counter incision lateral to the scrotum on both sides. Dissection. Up anteriorly along the scrotum. I was able to get into the scrotum with digital dissection again encountering multiple pockets of turbid foul-smelling fluid. All necrotic tissue was excised 1 identified. On the scrotum 2 additional counter incisions were created. 9 separate incisions had been created during this dissection. Hemostasis was obtained using electrocautery. At the point of conclusion of all sharp debridement I did not identify any additional areas of tracking or necrotic tissue. I didn't brought to the pulse irrigator to the field and copiously irrigated the surgical field. At this 0.2 rolls of Kerlix gauze were brought to the field and were soaked in Betadine solution. The Kerlix rolls were trimmed to fit into the counter incisions packing of the wounds and all the subcuticular tracking with the Kerlix gauze. A dry Kerlix roll was then placed over the entire surgical field and 3 separate ABG pads were placed. The drapes were then removed at this time. The dressings were secured with Medipore tape. The patient was placed back in a supine position.    At this point I opted to place a central venous catheter for additional access  as well as for fluid monitoring. Is left neck and chest were prepped with chlorhexidine solution and draped in standard fashion. Using sterile Seldinger technique I inserted triple-lumen catheter into the left subclavian  vein. Good venous return was obtained at all 3 ports. Patient tolerated this procedure well. At this point the patient was allowed to come out of general anesthetic. He was transferred to the PACU in stable condition. At the conclusion of procedure all instrument, sponge, needle counts are correct. Patient tolerated procedure.  Portable chest x-ray was ordered in the PACU to confirm placement of the central line.   Complications:  None apparent  EBL:  300 mL  Specimen:  Anaerobic and aerobic cultures, necrotic debrded tissue     Addendum: CDI query: As I think it is expressed and the above procedure note this was an excisional sharp surgical debridement of the perirectal, perineal and scrotal regions. The debridement included soft tissue fascia and likely necrotic muscle as this was in necrotizing fasciitis consistent with Fournier's gangrene.

## 2013-04-20 NOTE — Progress Notes (Signed)
CXR done

## 2013-04-20 NOTE — Progress Notes (Signed)
Dr Leticia Penna in to check pt. CXR good. May use triple lumen catheter.

## 2013-04-20 NOTE — Addendum Note (Signed)
Addendum created 04/20/13 1626 by Marolyn Hammock, CRNA   Modules edited: Anesthesia Events

## 2013-04-20 NOTE — ED Notes (Signed)
OR team at bedside discussing plan of care and what to expect. Pt verbalized understanding, consent signed for surgical treatment

## 2013-04-20 NOTE — Transfer of Care (Signed)
Immediate Anesthesia Transfer of Care Note  Patient: Shawn Fleming  Procedure(s) Performed: Procedure(s): SHARP SURGICAL DEBRIDEMENT PERINEAL INFECTION (N/A) INSERTION CENTRAL LINE LEFT SUBCLAVIAN (Left)  Patient Location: PACU  Anesthesia Type:General  Level of Consciousness: sedated and patient cooperative  Airway & Oxygen Therapy: Patient Spontanous Breathing and Patient connected to face mask oxygen  Post-op Assessment: Report given to PACU RN and Post -op Vital signs reviewed and stable  Post vital signs: Reviewed and stable  Complications: No apparent anesthesia complications

## 2013-04-20 NOTE — Progress Notes (Signed)
In ICU. Informed Marcelino Duster that pt had belongings locked up in security. Voiced understanding.

## 2013-04-20 NOTE — ED Notes (Signed)
Pt reports was diagnosed with pilonidal cyst Friday and started taking antibiotic.  Reports today noticed the infection is spreading.  Pt unable to tolerate sitting.  Pt diaphoretic.

## 2013-04-20 NOTE — Anesthesia Postprocedure Evaluation (Signed)
  Anesthesia Post-op Note  Patient: Shawn Fleming  Procedure(s) Performed: Procedure(s): SHARP SURGICAL DEBRIDEMENT PERINEAL INFECTION (N/A) INSERTION CENTRAL LINE LEFT SUBCLAVIAN (Left)  Patient Location: PACU  Anesthesia Type:General  Level of Consciousness: sedated  Airway and Oxygen Therapy: Patient Spontanous Breathing and Patient connected to face mask oxygen  Post-op Pain: none  Post-op Assessment: Post-op Vital signs reviewed, Patient's Cardiovascular Status Stable, Respiratory Function Stable, Patent Airway, No signs of Nausea or vomiting and Pain level controlled  Post-op Vital Signs: Reviewed and stable  Complications: No apparent anesthesia complications

## 2013-04-20 NOTE — Progress Notes (Signed)
Eyeglasses returned to pt. 

## 2013-04-20 NOTE — Progress Notes (Signed)
Spoke to Dr Leticia Penna about order clarification. Per Dr Leticia Penna hold Lovenox tonight. Pharmacy called and made aware. Also, pt requesting something to eat and drink. Explained to pt why he can't have any food. Per Dr Leticia Penna pt may have ice chips. Pt made aware.  Also, pt's ex-wife is at bedside and it is ok to share information with her.

## 2013-04-20 NOTE — Progress Notes (Signed)
Lab waiting on correct pt albels to draw blood. Blood will be drawn in ICU.

## 2013-04-20 NOTE — Progress Notes (Signed)
ANTIBIOTIC CONSULT NOTE - INITIAL  Pharmacy Consult for Vancomycin Indication: Fournier's gangrene  No Known Allergies  Patient Measurements: Height: 5\' 6"  (167.6 cm) Weight: 212 lb (96.163 kg) IBW/kg (Calculated) : 63.8  Vital Signs: Temp: 97.5 F (36.4 C) (05/04 1700) Temp src: Oral (05/04 1616) BP: 117/69 mmHg (05/04 1700) Pulse Rate: 108 (05/04 1700) Intake/Output from previous day:   Intake/Output from this shift: Total I/O In: 2000 [I.V.:2000] Out: 300 [Blood:300]  Labs:  Recent Labs  04/20/13 1010 04/20/13 1013 04/20/13 1727  WBC 25.7*  --  27.0*  HGB 13.1 13.3 10.9*  PLT 172  --  156  CREATININE 2.66* 2.60*  --    Estimated Creatinine Clearance: 34.1 ml/min (by C-G formula based on Cr of 2.6). No results found for this basename: VANCOTROUGH, Leodis Binet, VANCORANDOM, GENTTROUGH, GENTPEAK, GENTRANDOM, TOBRATROUGH, TOBRAPEAK, TOBRARND, AMIKACINPEAK, AMIKACINTROU, AMIKACIN,  in the last 72 hours   Microbiology: Recent Results (from the past 720 hour(s))  CULTURE, BLOOD (ROUTINE X 2)     Status: None   Collection Time    04/20/13 10:15 AM      Result Value Range Status   Specimen Description BLOOD BLOOD RIGHT ARM   Final   Special Requests BOTTLES DRAWN AEROBIC AND ANAEROBIC 8CC EACH   Final   Culture PENDING   Incomplete   Report Status PENDING   Incomplete  CULTURE, BLOOD (ROUTINE X 2)     Status: None   Collection Time    04/20/13 10:35 AM      Result Value Range Status   Specimen Description BLOOD BLOOD LEFT HAND   Final   Special Requests BOTTLES DRAWN AEROBIC AND ANAEROBIC 8CC   Final   Culture PENDING   Incomplete   Report Status PENDING   Incomplete    Medical History: Past Medical History  Diagnosis Date  . Kidney calculi   . Migraine     Medications:  Scheduled:  . [COMPLETED] albuterol  2.5 mg Nebulization Once  . escitalopram  10 mg Oral Daily  . [COMPLETED]  HYDROmorphone (DILAUDID) injection  1 mg Intravenous Once  . pantoprazole  (PROTONIX) IV  40 mg Intravenous QHS  . [COMPLETED] piperacillin-tazobactam  3.375 g Intravenous Once  . piperacillin-tazobactam (ZOSYN)  IV  3.375 g Intravenous Q8H  . pregabalin  75 mg Oral BID  . [COMPLETED] sodium chloride  1,000 mL Intravenous Once  . [COMPLETED] sodium chloride  1,000 mL Intravenous Once  . [COMPLETED] vancomycin  1,000 mg Intravenous Once  . [DISCONTINUED] enoxaparin (LOVENOX) injection  40 mg Subcutaneous Once  . [DISCONTINUED] enoxaparin (LOVENOX) injection  40 mg Subcutaneous Q24H  . [DISCONTINUED] piperacillin-tazobactam  3.375 g Intravenous Once  . [DISCONTINUED] piperacillin-tazobactam  3.375 g Intravenous Once   Assessment: 57 yo obese M with Fournier's gangrene s/p emergent I&D.  He received initial doses of Vanc & Zosyn at 1030 in ED.   His WBC and lactic acid levels are elevated.   He is also noted to have acute renal failure (baseline Scr ~ 1.1-1.2).   Goal of Therapy:  Vancomycin trough level 15-20 mcg/ml  Plan:  1) Vancomycin 1250mg  IV q24h 2) Continue Zosyn 3.375gm IV Q8h to be infused over 4hrs 3) Check Vancomycin trough at steady state 4) Monitor renal function and cx data   Elson Clan 04/20/2013,5:57 PM

## 2013-04-20 NOTE — Anesthesia Preprocedure Evaluation (Addendum)
Anesthesia Evaluation  Patient identified by MRN, date of birth, ID band Patient awake    Reviewed: Allergy & Precautions, H&P , NPO status , Patient's Chart, lab work & pertinent test results, reviewed documented beta blocker date and time   Airway Mallampati: I TM Distance: >3 FB Neck ROM: Full    Dental  (+) Edentulous Upper and Upper Dentures   Pulmonary Current Smoker,  breath sounds clear to auscultation        Cardiovascular hypertension, Pt. on medications Rhythm:Regular     Neuro/Psych  Headaches, Anxiety Depression    GI/Hepatic GERD-  Medicated,Nexium for reflux   Endo/Other    Renal/GU Renal InsufficiencyRenal disease   Renal calculi history    Musculoskeletal   Abdominal (+) + obese,   Peds  Hematology   Anesthesia Other Findings Last PO 0730 am today; Pound cake and coffee  Reproductive/Obstetrics                          Anesthesia Physical Anesthesia Plan  ASA: III and emergent  Anesthesia Plan: General   Post-op Pain Management:    Induction: Intravenous, Rapid sequence and Cricoid pressure planned  Airway Management Planned: Oral ETT  Additional Equipment:   Intra-op Plan:   Post-operative Plan: Extubation in OR  Informed Consent: I have reviewed the patients History and Physical, chart, labs and discussed the procedure including the risks, benefits and alternatives for the proposed anesthesia with the patient or authorized representative who has indicated his/her understanding and acceptance.   Dental advisory given  Plan Discussed with: Anesthesiologist and Surgeon  Anesthesia Plan Comments:         Anesthesia Quick Evaluation

## 2013-04-21 ENCOUNTER — Encounter (HOSPITAL_COMMUNITY): Payer: Self-pay | Admitting: General Surgery

## 2013-04-21 LAB — BASIC METABOLIC PANEL
BUN: 45 mg/dL — ABNORMAL HIGH (ref 6–23)
CO2: 20 mEq/L (ref 19–32)
Calcium: 8.3 mg/dL — ABNORMAL LOW (ref 8.4–10.5)
Chloride: 103 mEq/L (ref 96–112)
Creatinine, Ser: 2.36 mg/dL — ABNORMAL HIGH (ref 0.50–1.35)
GFR calc Af Amer: 34 mL/min — ABNORMAL LOW (ref >90)
GFR calc non Af Amer: 29 mL/min — ABNORMAL LOW (ref >90)
Glucose, Bld: 141 mg/dL — ABNORMAL HIGH (ref 70–99)
Potassium: 4.7 mEq/L (ref 3.5–5.1)
Sodium: 134 mEq/L — ABNORMAL LOW (ref 135–145)

## 2013-04-21 LAB — URINE CULTURE
Colony Count: NO GROWTH
Culture: NO GROWTH

## 2013-04-21 LAB — CBC
HCT: 28.8 % — ABNORMAL LOW (ref 39.0–52.0)
Hemoglobin: 10 g/dL — ABNORMAL LOW (ref 13.0–17.0)
MCH: 30.3 pg (ref 26.0–34.0)
MCHC: 34.7 g/dL (ref 30.0–36.0)
MCV: 87.3 fL (ref 78.0–100.0)
Platelets: 149 10*3/uL — ABNORMAL LOW (ref 150–400)
RBC: 3.3 MIL/uL — ABNORMAL LOW (ref 4.22–5.81)
RDW: 14.8 % (ref 11.5–15.5)
WBC: 22.3 10*3/uL — ABNORMAL HIGH (ref 4.0–10.5)

## 2013-04-21 MED ORDER — DEXTROSE-NACL 5-0.9 % IV SOLN
INTRAVENOUS | Status: DC
  Administered 2013-04-21 – 2013-04-22 (×3): via INTRAVENOUS

## 2013-04-21 MED ORDER — CLINDAMYCIN PHOSPHATE 600 MG/50ML IV SOLN
600.0000 mg | Freq: Three times a day (TID) | INTRAVENOUS | Status: DC
  Administered 2013-04-21 – 2013-04-25 (×12): 600 mg via INTRAVENOUS
  Filled 2013-04-21 (×13): qty 50

## 2013-04-21 NOTE — Progress Notes (Signed)
ANTIBIOTIC CONSULT NOTE  Pharmacy Consult for Vancomycin, Clindamycin Indication: Fournier's gangrene  No Known Allergies  Patient Measurements: Height: 5\' 6"  (167.6 cm) Weight: 237 lb 14 oz (107.9 kg) IBW/kg (Calculated) : 63.8  Vital Signs: Temp: 98.4 F (36.9 C) (05/05 0759) Temp src: Oral (05/05 0759) BP: 113/73 mmHg (05/05 0645) Pulse Rate: 107 (05/05 0645) Intake/Output from previous day: 05/04 0701 - 05/05 0700 In: 3581.3 [I.V.:3531.3; IV Piggyback:50] Out: 800 [Urine:500; Blood:300] Intake/Output from this shift:    Labs:  Recent Labs  04/20/13 1010 04/20/13 1013 04/20/13 1727 04/21/13 0441  WBC 25.7*  --  27.0* 22.3*  HGB 13.1 13.3 10.9* 10.0*  PLT 172  --  156 149*  CREATININE 2.66* 2.60* 2.15* 2.36*   Estimated Creatinine Clearance: 39.8 ml/min (by C-G formula based on Cr of 2.36). No results found for this basename: VANCOTROUGH, Leodis Binet, VANCORANDOM, GENTTROUGH, GENTPEAK, GENTRANDOM, TOBRATROUGH, TOBRAPEAK, TOBRARND, AMIKACINPEAK, AMIKACINTROU, AMIKACIN,  in the last 72 hours   Microbiology: Recent Results (from the past 720 hour(s))  CULTURE, BLOOD (ROUTINE X 2)     Status: None   Collection Time    04/20/13 10:15 AM      Result Value Range Status   Specimen Description BLOOD BLOOD RIGHT ARM   Final   Special Requests BOTTLES DRAWN AEROBIC AND ANAEROBIC 8CC EACH   Final   Culture NO GROWTH 1 DAY   Final   Report Status PENDING   Incomplete  CULTURE, BLOOD (ROUTINE X 2)     Status: None   Collection Time    04/20/13 10:35 AM      Result Value Range Status   Specimen Description BLOOD BLOOD LEFT HAND   Final   Special Requests BOTTLES DRAWN AEROBIC AND ANAEROBIC 8CC   Final   Culture NO GROWTH 1 DAY   Final   Report Status PENDING   Incomplete  CULTURE, ROUTINE-ABSCESS     Status: None   Collection Time    04/20/13  4:30 PM      Result Value Range Status   Specimen Description PERINEAL   Final   Special Requests NONE   Final   Gram Stain  PENDING   Incomplete   Culture NO GROWTH   Final   Report Status PENDING   Incomplete    Medical History: Past Medical History  Diagnosis Date  . Kidney calculi   . Migraine     Medications:  Scheduled:  . [COMPLETED] albuterol  2.5 mg Nebulization Once  . clindamycin (CLEOCIN) IV  600 mg Intravenous Q8H  . escitalopram  10 mg Oral Daily  . pantoprazole (PROTONIX) IV  40 mg Intravenous QHS  . [COMPLETED] piperacillin-tazobactam  3.375 g Intravenous Once  . piperacillin-tazobactam (ZOSYN)  IV  3.375 g Intravenous Q8H  . pregabalin  75 mg Oral BID  . [COMPLETED] sodium chloride  1,000 mL Intravenous Once  . [COMPLETED] sodium chloride  1,000 mL Intravenous Once  . vancomycin  1,250 mg Intravenous Q24H  . [COMPLETED] vancomycin  1,000 mg Intravenous Once  . [DISCONTINUED] enoxaparin (LOVENOX) injection  40 mg Subcutaneous Once  . [DISCONTINUED] enoxaparin (LOVENOX) injection  40 mg Subcutaneous Q24H   Assessment: 57 yo obese M with Fournier's gangrene s/p emergent I&D.  He is on Vanc & Zosyn.  Add Clindamycin per surgery. Lactic acid levels were elevated on admission.  WBC trending down.    He is also noted to have acute renal failure (baseline Scr ~ 1.1-1.2).   Goal of Therapy:  Vancomycin trough level 15-20 mcg/ml  Plan:  1) Continue Vancomycin 1250mg  IV q24h 2) Continue Zosyn 3.375gm IV Q8h to be infused over 4hrs 3) Add Clindamycin 600mg  IV Q8h 4) Check Vancomycin trough at steady state 5) Monitor renal function and cx data   Elson Clan 04/21/2013,10:55 AM

## 2013-04-21 NOTE — Progress Notes (Addendum)
1 Day Post-Op  Subjective: Resting comfortably.  Objective: Vital signs in last 24 hours: Temp:  [97.5 F (36.4 C)-99.1 F (37.3 C)] 98.4 F (36.9 C) (05/05 0759) Pulse Rate:  [94-117] 107 (05/05 0645) Resp:  [6-22] 12 (05/05 0645) BP: (84-134)/(45-114) 113/73 mmHg (05/05 0645) SpO2:  [88 %-100 %] 91 % (05/05 0645) FiO2 (%):  [100 %] 100 % (05/04 1625) Weight:  [107.9 kg (237 lb 14 oz)] 107.9 kg (237 lb 14 oz) (05/05 0500) Last BM Date: 04/19/13  Intake/Output from previous day: 05/04 0701 - 05/05 0700 In: 3581.3 [I.V.:3531.3; IV Piggyback:50] Out: 800 [Urine:500; Blood:300] Intake/Output this shift:    General appearance: alert, cooperative and no distress Resp: clear to auscultation bilaterally Cardio: regular rate and rhythm, S1, S2 normal, no murmur, click, rub or gallop Skin: Multiple incisions and packings removed from the perineal area up to the scrotum. Base of tissue appears to be healing well. No abscess cavities appreciated. No extensive necrotic tissue present.  Lab Results:   Recent Labs  04/20/13 1727 04/21/13 0441  WBC 27.0* 22.3*  HGB 10.9* 10.0*  HCT 31.6* 28.8*  PLT 156 149*   BMET  Recent Labs  04/20/13 1010 04/20/13 1013 04/20/13 1727 04/21/13 0441  NA 137 139  --  134*  K 4.1 3.9  --  4.7  CL 99 108  --  103  CO2 22  --   --  20  GLUCOSE 129* 125*  --  141*  BUN 46* 41*  --  45*  CREATININE 2.66* 2.60* 2.15* 2.36*  CALCIUM 9.8  --   --  8.3*   PT/INR No results found for this basename: LABPROT, INR,  in the last 72 hours  Studies/Results: Ct Abdomen Pelvis Wo Contrast  04/20/2013  *RADIOLOGY REPORT*  Clinical Data: Worsening perirectal pain  CT ABDOMEN AND PELVIS WITHOUT CONTRAST  Technique:  Multidetector CT imaging of the abdomen and pelvis was performed following the standard protocol without intravenous contrast.  Comparison: 02/28/2012  Findings: Lung bases are unremarkable. Study is limited without IV contrast.  Sagittal images  of the spine shows mild reactive changes thoracolumbar spine.  Unenhanced liver, spleen, pancreas and adrenals are unremarkable.  Again noted fatty replacement of pancreatic head region.  Stable chronic mild stranding of mesenteric fat surrounding the root of mesentery.  Moderate colonic stool.  Colonic diverticula are noted without evidence of acute diverticulitis.  No small bowel or colonic obstruction.  No pericecal inflammation.  The terminal ileum is unremarkable. Punctate nonobstructive renal calcified calculi are noted bilaterally. No hydronephrosis or hydroureter.  No calcified ureteral calculi are noted.  The urinary bladder is unremarkable. Prostate gland small calcification is noted.  There is a left inguinal scrotal canal hernia containing fat measures about 3 cm without evidence of acute complication.  No pelvic ascites or adenopathy.  Bilateral nonspecific inguinal lymph nodes are noted.  There is small amount of soft tissue air in the perianal region. There is mild skin thickening in the perianal region.  There is subcutaneous stranding in mid perineum superficial.  Axial image 101 there is  subcutaneous air in mid perineum extending in the left inferior aspect of the scrotal sac.  Findings are highly suspicious for cellulitis with gas-forming bacteria.  No drainable abscess is noted.  A surgical clip is noted in the left scrotal sac.  There is some skin thickening in the inferior scrotum.  Clinical correlation is necessary.  IMPRESSION:  1.  No hydronephrosis or hydroureter.  Probable nonobstructive nephrolithiasis. 2.  No calcified ureteral calculi. 3.  Colonic diverticula without evidence of acute diverticulitis. 4.  There is soft tissue  air in perianal region.  Soft tissue air and subcutaneous stranding noted mid perineum extending in the left inferior scrotal sac. Findings are highly suspicious for cellulitis with gas-forming bacteria.  Clinical correlation is necessary.  No drainable abscess is  noted.  Clinical correlation is necessary to exclude  necrotizing fasciitis or Fournier gangrene.   Original Report Authenticated By: Natasha Mead, M.D.    Dg Chest Port 1 View  04/20/2013  *RADIOLOGY REPORT*  Clinical Data: Evaluate for central line placement.  PORTABLE CHEST - 1 VIEW  Comparison: None.  Findings: The left subclavian central line tip is in the upper SVC and pointing towards the right side of the SVC wall.  There is no evidence for a pneumothorax.  There are scattered linear densities in the left lung and medial right upper chest.  Heart size is grossly normal.  Trachea is midline.  IMPRESSION: Central line tip in the upper SVC region.  No evidence for a pneumothorax.  Streaky lung densities could represent atelectasis or asymmetric edema.   Original Report Authenticated By: Richarda Overlie, M.D.     Anti-infectives: Anti-infectives   Start     Dose/Rate Route Frequency Ordered Stop   04/21/13 1000  vancomycin (VANCOCIN) 1,250 mg in sodium chloride 0.9 % 250 mL IVPB     1,250 mg 166.7 mL/hr over 90 Minutes Intravenous Every 24 hours 04/20/13 1809     04/20/13 1830  piperacillin-tazobactam (ZOSYN) IVPB 3.375 g     3.375 g 12.5 mL/hr over 240 Minutes Intravenous Every 8 hours 04/20/13 1737     04/20/13 1045  piperacillin-tazobactam (ZOSYN) IVPB 3.375 g  Status:  Discontinued     3.375 g 12.5 mL/hr over 240 Minutes Intravenous  Once 04/20/13 1035 04/20/13 1036   04/20/13 1045  piperacillin-tazobactam (ZOSYN) IVPB 3.375 g     3.375 g 100 mL/hr over 30 Minutes Intravenous  Once 04/20/13 1036 04/20/13 1120   04/20/13 1000  vancomycin (VANCOCIN) IVPB 1000 mg/200 mL premix     1,000 mg 200 mL/hr over 60 Minutes Intravenous  Once 04/20/13 0953 04/20/13 1230   04/20/13 1000  piperacillin-tazobactam (ZOSYN) IVPB 3.375 g  Status:  Discontinued     3.375 g 12.5 mL/hr over 240 Minutes Intravenous  Once 04/20/13 0953 04/20/13 1035      Assessment/Plan: s/p Procedure(s): SHARP SURGICAL  DEBRIDEMENT PERINEAL INFECTION INSERTION CENTRAL LINE LEFT SUBCLAVIAN Impression: Stable on postoperative day one. Will add clindamycin to drug regimen. Perineal dressings were removed and the underlying tissue appears to be not particularly gangrenous. Will discuss with physical therapy ongoing wound care as to whether he would require whirlpool or bedside pulse lavage therapy. Will follow closely.  LOS: 1 day    Tayja Manzer A 04/21/2013  Addendum: Do to the extensor Fournier's gangrene involving the base of the scrotum in the perineal area, we'll leave Foley in for wound care management.

## 2013-04-21 NOTE — Anesthesia Postprocedure Evaluation (Signed)
  Anesthesia Post-op Note  Patient: Shawn Fleming  Procedure(s) Performed: Procedure(s): SHARP SURGICAL DEBRIDEMENT PERINEAL INFECTION (N/A) INSERTION CENTRAL LINE LEFT SUBCLAVIAN (Left)  Patient Location: PACU  Anesthesia Type:General  Level of Consciousness: awake, alert , oriented and patient cooperative  Airway and Oxygen Therapy: Patient Spontanous Breathing  Post-op Pain: 1 /10, mild  Post-op Assessment: Post-op Vital signs reviewed, Patient's Cardiovascular Status Stable, Respiratory Function Stable, Patent Airway and No signs of Nausea or vomiting  Post-op Vital Signs: Reviewed and stable  Complications: No apparent anesthesia complications

## 2013-04-22 LAB — CBC
HCT: 26.3 % — ABNORMAL LOW (ref 39.0–52.0)
Hemoglobin: 9 g/dL — ABNORMAL LOW (ref 13.0–17.0)
MCH: 30 pg (ref 26.0–34.0)
MCHC: 34.2 g/dL (ref 30.0–36.0)
MCV: 87.7 fL (ref 78.0–100.0)
Platelets: 158 10*3/uL (ref 150–400)
RBC: 3 MIL/uL — ABNORMAL LOW (ref 4.22–5.81)
RDW: 15.2 % (ref 11.5–15.5)
WBC: 14.9 10*3/uL — ABNORMAL HIGH (ref 4.0–10.5)

## 2013-04-22 LAB — BASIC METABOLIC PANEL
BUN: 33 mg/dL — ABNORMAL HIGH (ref 6–23)
CO2: 23 mEq/L (ref 19–32)
Calcium: 8.2 mg/dL — ABNORMAL LOW (ref 8.4–10.5)
Chloride: 107 mEq/L (ref 96–112)
Creatinine, Ser: 2.11 mg/dL — ABNORMAL HIGH (ref 0.50–1.35)
GFR calc Af Amer: 38 mL/min — ABNORMAL LOW (ref >90)
GFR calc non Af Amer: 33 mL/min — ABNORMAL LOW (ref >90)
Glucose, Bld: 105 mg/dL — ABNORMAL HIGH (ref 70–99)
Potassium: 3.8 mEq/L (ref 3.5–5.1)
Sodium: 138 mEq/L (ref 135–145)

## 2013-04-22 LAB — MAGNESIUM: Magnesium: 2.2 mg/dL (ref 1.5–2.5)

## 2013-04-22 LAB — PHOSPHORUS: Phosphorus: 3.6 mg/dL (ref 2.3–4.6)

## 2013-04-22 LAB — MRSA PCR SCREENING: MRSA by PCR: NEGATIVE

## 2013-04-22 MED ORDER — ALBUTEROL SULFATE HFA 108 (90 BASE) MCG/ACT IN AERS
INHALATION_SPRAY | RESPIRATORY_TRACT | Status: AC
  Filled 2013-04-22: qty 6.7

## 2013-04-22 MED ORDER — CLINDAMYCIN PHOSPHATE 600 MG/50ML IV SOLN
INTRAVENOUS | Status: AC
  Filled 2013-04-22: qty 100

## 2013-04-22 MED ORDER — KCL IN DEXTROSE-NACL 20-5-0.45 MEQ/L-%-% IV SOLN
INTRAVENOUS | Status: DC
  Administered 2013-04-22 – 2013-04-28 (×7): via INTRAVENOUS

## 2013-04-22 NOTE — Progress Notes (Signed)
Physical Therapy Wound Treatment Patient Details  Name: Shawn Fleming MRN: [id number] Date of Birth: July 16, 1956  Today's Date: 04/22/2013 Time:  -     Subjective  Subjective: pt states he wants to help [place] any way that he can Patient and Family Stated Goals: return home with wounds healed  Pain Score: Pain Score:   3 (pew-medicated before wound cleaning and dressing change)  Wound Assessment  Incision 04/20/13 Perineum Bilateral (Active)  Site / Wound Assessment Clean 04/22/2013 12:44 PM  Margins Unattacted edges (unapproximated) 04/22/2013 12:44 PM  Closure None 04/22/2013 12:44 PM  Drainage Amount Minimal 04/22/2013 12:44 PM  Drainage Description Serosanguineous 04/22/2013 12:44 PM  Treatment Cleansed 04/22/2013 12:44 PM  Dressing Type ABD;Moist to dry 04/22/2013 12:44 PM  Dressing Changed 04/22/2013  9:30 AM     Incision 04/20/13 Chest Left (Active)  Site / Wound Assessment Clean;Dry 04/22/2013  8:05 AM  Drainage Amount None 04/22/2013  8:05 AM  Dressing Type Transparent dressing 04/22/2013  8:05 AM  Dressing Clean;Dry;Intact 04/22/2013  8:05 AM       Wound Assessment and Plan  Wound Therapy - Assess/Plan/Recommendations Wound Therapy - Clinical Statement: Pt received continued PL to all perineal incisions.  Pcaking removed had minimal serosanguinous drainage, no pus visible.  There was no odor.  The peripheral tissue had increased erythema, however this region was palpated and tissue was soft and pliable with no cavities felt.  Pt was more awake today and even though medicated had significant discomfort with all elements of tx.  It has been a true challenge to reach all areas for adequate cleansing and packing.   Wound Therapy - Functional Problem List: None-pt is able to ambulate, but is trying to keep all pressure off of wound site Hydrotherapy Plan: Dressing change;Pulsatile lavage with suction Wound Therapy - Frequency: 6X / week Wound Therapy - Follow Up Recommendations: Wound Care  Center Wound Plan: continue as outlined  Wound Therapy Goals- Improve the function of patient's integumentary system by progressing the wound(s) through the phases of wound healing (inflammation - proliferation - remodeling) by: Decrease Length/Width/Depth - Progress: Progressing toward goal  Goals will be updated until maximal potential achieved or discharge criteria met.  Discharge criteria: when goals achieved, discharge from hospital, MD decision/surgical intervention, no progress towards goals, refusal/missing three consecutive treatments without notification or medical reason.  GP     Konrad Penta 04/22/2013, 12:52 PM

## 2013-04-22 NOTE — Progress Notes (Addendum)
Physical Therapy Wound Treatment Patient Details  Name: Shawn Fleming MRN: [id number] Date of Birth: Jul 03, 1956  Today's Date: 04/21/2013 Time: 1100-1146 Time Calculation (min): 46 min  Subjective  Subjective: Pt groggy and not able to offer any intelligible comments Patient and Family Stated Goals: none stated Prior Treatments: I & D on 04-20-13  Pain Score: Pain Score: 0-No pain  Wound Assessment  Incision 04/20/13 Perineum Bilateral (Active)  Site / Wound Assessment Clean 04/22/2013  8:05 AM  Margins Unattacted edges (unapproximated) 04/22/2013  8:05 AM  Closure None 04/22/2013  8:05 AM  Drainage Amount Minimal 04/22/2013  8:05 AM  Drainage Description Serous 04/22/2013  8:05 AM  Treatment Cleansed 04/21/2013  8:00 PM  Dressing Type ABD;Hydrogel;Moist to dry 04/22/2013  8:05 AM  Dressing Changed 04/22/2013  8:05 AM     Incision 04/20/13 Chest Left (Active)  Site / Wound Assessment Clean;Dry 04/22/2013  8:05 AM  Drainage Amount None 04/22/2013  8:05 AM  Dressing Type Transparent dressing 04/22/2013  8:05 AM  Dressing Clean;Dry;Intact 04/22/2013  8:05 AM       Wound Assessment and Plan  Wound Therapy - Assess/Plan/Recommendations Wound Therapy - Clinical Statement: Pt was seen for initial PL tx to all incisions (9) from I & D done yesterday.  Wound packing was removed by Dr. Lovell Sheehan this AM.  Many of the wounds are interconnected and extend from the L scrotum to the peineum and posteriorly toward the rectum.  I was unable to measure each incision due to the number  of them and the difficulty of  access and pt fatigue.  The deepest wound measured 5.0 cm deep.  All are clean with no purulent drainage. There is no odor and no erythema at wound periphery.  Pt was sedated prior to the procedure and tolerated it well.  All wounds were packed with conform gauze soaked in steile saline and hydrogel.  ABD pads were used to cover wounds and surgical underwear was placed on the pt to hold dressings in place ( he is  developing skin irritation fromn the Medipore tape used yesterday).  We will follow 6 days /week.        Wound Therapy - Functional Problem List: none Hydrotherapy Plan: Dressing change;Debridement;Patient/family education;Pulsatile lavage with suction Wound Therapy - Frequency: 6X / week Wound Therapy - Follow Up Recommendations: Wound Care Center Wound Plan: PL with 2 L sterile saline, debridement as nesessary, packing with saline soaked gauze covered with ABD pad  Wound Therapy Goals- Improve the function of patient's integumentary system by progressing the wound(s) through the phases of wound healing (inflammation - proliferation - remodeling) by: Decrease Length/Width/Depth by (cm): overall reduction of incision depth by 10% Decrease Length/Width/Depth - Progress: Goal set today Time For Goal Achievement: 7 days Wound Therapy - Potential for Goals: Good  Goals will be updated until maximal potential achieved or discharge criteria met.  Discharge criteria: when goals achieved, discharge from hospital, MD decision/surgical intervention, no progress towards goals, refusal/missing three consecutive treatments without notification or medical reason.  GP     Myrlene Broker L 04/22/2013, 8:08 AM

## 2013-04-22 NOTE — Progress Notes (Signed)
2 Days Post-Op  Subjective: Denies any significant incisional pain. Is still somewhat loopy due to narcotics.  Objective: Vital signs in last 24 hours: Temp:  [98.5 F (36.9 C)-100 F (37.8 C)] 98.8 F (37.1 C) (05/06 1126) Pulse Rate:  [84-116] 93 (05/06 1000) Resp:  [8-23] 13 (05/06 1000) BP: (82-127)/(48-96) 123/78 mmHg (05/06 1000) SpO2:  [86 %-100 %] 98 % (05/06 1000) Weight:  [112.7 kg (248 lb 7.3 oz)] 112.7 kg (248 lb 7.3 oz) (05/06 0500) Last BM Date: 04/21/13  Intake/Output from previous day: 05/05 0701 - 05/06 0700 In: 4421.7 [P.O.:1530; I.V.:2391.7; IV Piggyback:500] Out: 2750 [Urine:2750] Intake/Output this shift: Total I/O In: 711.3 [I.V.:361.3; IV Piggyback:350] Out: -   General appearance: cooperative and no distress Resp: clear to auscultation bilaterally Cardio: regular rate and rhythm, S1, S2 normal, no murmur, click, rub or gallop Skin: Perineal dressings intact.  Lab Results:   Recent Labs  04/21/13 0441 04/22/13 0441  WBC 22.3* 14.9*  HGB 10.0* 9.0*  HCT 28.8* 26.3*  PLT 149* 158   BMET  Recent Labs  04/21/13 0441 04/22/13 0441  NA 134* 138  K 4.7 3.8  CL 103 107  CO2 20 23  GLUCOSE 141* 105*  BUN 45* 33*  CREATININE 2.36* 2.11*  CALCIUM 8.3* 8.2*   PT/INR No results found for this basename: LABPROT, INR,  in the last 72 hours  Studies/Results: Dg Chest Port 1 View  04/20/2013  *RADIOLOGY REPORT*  Clinical Data: Evaluate for central line placement.  PORTABLE CHEST - 1 VIEW  Comparison: None.  Findings: The left subclavian central line tip is in the upper SVC and pointing towards the right side of the SVC wall.  There is no evidence for a pneumothorax.  There are scattered linear densities in the left lung and medial right upper chest.  Heart size is grossly normal.  Trachea is midline.  IMPRESSION: Central line tip in the upper SVC region.  No evidence for a pneumothorax.  Streaky lung densities could represent atelectasis or  asymmetric edema.   Original Report Authenticated By: Richarda Overlie, M.D.     Anti-infectives: Anti-infectives   Start     Dose/Rate Route Frequency Ordered Stop   04/21/13 1200  clindamycin (CLEOCIN) IVPB 600 mg     600 mg 100 mL/hr over 30 Minutes Intravenous Every 8 hours 04/21/13 1054     04/21/13 1000  vancomycin (VANCOCIN) 1,250 mg in sodium chloride 0.9 % 250 mL IVPB     1,250 mg 166.7 mL/hr over 90 Minutes Intravenous Every 24 hours 04/20/13 1809     04/20/13 1830  piperacillin-tazobactam (ZOSYN) IVPB 3.375 g     3.375 g 12.5 mL/hr over 240 Minutes Intravenous Every 8 hours 04/20/13 1737     04/20/13 1045  piperacillin-tazobactam (ZOSYN) IVPB 3.375 g  Status:  Discontinued     3.375 g 12.5 mL/hr over 240 Minutes Intravenous  Once 04/20/13 1035 04/20/13 1036   04/20/13 1045  piperacillin-tazobactam (ZOSYN) IVPB 3.375 g     3.375 g 100 mL/hr over 30 Minutes Intravenous  Once 04/20/13 1036 04/20/13 1120   04/20/13 1000  vancomycin (VANCOCIN) IVPB 1000 mg/200 mL premix     1,000 mg 200 mL/hr over 60 Minutes Intravenous  Once 04/20/13 0953 04/20/13 1230   04/20/13 1000  piperacillin-tazobactam (ZOSYN) IVPB 3.375 g  Status:  Discontinued     3.375 g 12.5 mL/hr over 240 Minutes Intravenous  Once 04/20/13 0953 04/20/13 1035      Assessment/Plan: s/p  Procedure(s): SHARP SURGICAL DEBRIDEMENT PERINEAL INFECTION INSERTION CENTRAL LINE LEFT SUBCLAVIAN Impression: Continues to recover well on postoperative day 2. Renal insufficiency is stable. His nonoliguric in nature. Leukocytosis is much improved. Will continue pulse lavage therapy to perineal wound.  LOS: 2 days    Tristine Langi A 04/22/2013

## 2013-04-23 LAB — CBC
HCT: 26.2 % — ABNORMAL LOW (ref 39.0–52.0)
Hemoglobin: 8.9 g/dL — ABNORMAL LOW (ref 13.0–17.0)
MCH: 29.6 pg (ref 26.0–34.0)
MCHC: 34 g/dL (ref 30.0–36.0)
MCV: 87 fL (ref 78.0–100.0)
Platelets: 174 10*3/uL (ref 150–400)
RBC: 3.01 MIL/uL — ABNORMAL LOW (ref 4.22–5.81)
RDW: 15.4 % (ref 11.5–15.5)
WBC: 12.9 10*3/uL — ABNORMAL HIGH (ref 4.0–10.5)

## 2013-04-23 LAB — BASIC METABOLIC PANEL
BUN: 16 mg/dL (ref 6–23)
CO2: 25 mEq/L (ref 19–32)
Calcium: 8.3 mg/dL — ABNORMAL LOW (ref 8.4–10.5)
Chloride: 110 mEq/L (ref 96–112)
Creatinine, Ser: 1.73 mg/dL — ABNORMAL HIGH (ref 0.50–1.35)
GFR calc Af Amer: 49 mL/min — ABNORMAL LOW (ref >90)
GFR calc non Af Amer: 42 mL/min — ABNORMAL LOW (ref >90)
Glucose, Bld: 156 mg/dL — ABNORMAL HIGH (ref 70–99)
Potassium: 4 mEq/L (ref 3.5–5.1)
Sodium: 142 mEq/L (ref 135–145)

## 2013-04-23 LAB — CULTURE, ROUTINE-ABSCESS: Gram Stain: NONE SEEN

## 2013-04-23 LAB — PHOSPHORUS: Phosphorus: 2.4 mg/dL (ref 2.3–4.6)

## 2013-04-23 LAB — MAGNESIUM: Magnesium: 2 mg/dL (ref 1.5–2.5)

## 2013-04-23 NOTE — Progress Notes (Signed)
UR Chart Review Completed  

## 2013-04-23 NOTE — Progress Notes (Addendum)
Physical Therapy Wound Treatment Patient Details  Name: Shawn Fleming MRN: [id number] Date of Birth: 25-Mar-1956  Today's Date: 04/23/2013 Time: 0850 -0950     Subjective  Subjective: Pt more tender today Prior Treatments: I&D; Pulse lavage  Pain Score: Pain Score:   7  Wound Assessment  Incision 04/20/13 Perineum Bilateral (Active)  Site / Wound Assessment Brown;Granulation tissue 04/23/2013  9:42 AM  Margins Attached edges (approximated) 04/23/2013  9:42 AM  Closure None 04/23/2013  9:42 AM  Drainage Amount Moderate 04/23/2013  9:42 AM  Drainage Description No odor;Serous 04/23/2013  9:42 AM  Treatment Cleansed 04/23/2013  9:42 AM  Dressing Type Gauze (Comment);Moist to moist 04/23/2013  9:42 AM  Dressing Changed 04/23/2013  9:42 AM     Incision 04/20/13 Chest Left (Active)  Site / Wound Assessment Clean;Dry 04/22/2013  8:05 AM  Drainage Amount None 04/22/2013  8:05 AM  Dressing Type Transparent dressing 04/22/2013  8:05 AM  Dressing Clean;Dry;Intact 04/22/2013  8:05 AM       Wound Assessment and Plan  Wound Therapy - Assess/Plan/Recommendations Wound Therapy - Clinical Statement: Pt recieved 2000 CC PL to all perineal incisions.  Pt has increased necrotic tissue along multiple sites.  Some areas were able to be debrided by therapist with foceps and sissors others therapist was unable to get to.  Pt able to tolerate treatment on stomach which proved easier for packing.   Hydrotherapy Plan: Debridement;Dressing change;Pulsatile lavage with suction Wound Therapy - Frequency: 6X / week Wound Therapy - Current Recommendations: PT Wound Therapy - Follow Up Recommendations: Wound Care Center Wound Plan: continue as outlined  Wound Therapy Goals- Improve the function of patient's integumentary system by progressing the wound(s) through the phases of wound healing (inflammation - proliferation - remodeling) by:    Goals will be updated until maximal potential achieved or discharge criteria met.   Discharge criteria: when goals achieved, discharge from hospital, MD decision/surgical intervention, no progress towards goals, refusal/missing three consecutive treatments without notification or medical reason.  GP     RUSSELL,CINDY 04/23/2013, 10:38 AM

## 2013-04-23 NOTE — Progress Notes (Signed)
3 Days Post-Op  Subjective: Resting comfortably. Sensorium seems to vary between normal and somewhat loopy, though improving.  Objective: Vital signs in last 24 hours: Temp:  [97 F (36.1 C)-99 F (37.2 C)] 97 F (36.1 C) (05/07 0515) Pulse Rate:  [84-108] 89 (05/07 0600) Resp:  [8-23] 13 (05/07 0600) BP: (99-137)/(59-108) 119/85 mmHg (05/07 0600) SpO2:  [86 %-100 %] 100 % (05/07 0600) Weight:  [108.2 kg (238 lb 8.6 oz)] 108.2 kg (238 lb 8.6 oz) (05/07 0515) Last BM Date: 04/21/13  Intake/Output from previous day: 05/06 0701 - 05/07 0700 In: 4311.3 [P.O.:1400; I.V.:2361.3; IV Piggyback:550] Out: 4700 [Urine:4700] Intake/Output this shift: Total I/O In: 1250 [I.V.:1200; IV Piggyback:50] Out: 2600 [Urine:2600]  General appearance: cooperative and no distress Resp: clear to auscultation bilaterally Cardio: regular rate and rhythm, S1, S2 normal, no murmur, click, rub or gallop Skin: Perineum healing well and the patient is tolerating pulse lavage therapy well. Patient has had multiple bowel movements which results in frequent dressing change.  Lab Results:   Recent Labs  04/22/13 0441 04/23/13 0424  WBC 14.9* 12.9*  HGB 9.0* 8.9*  HCT 26.3* 26.2*  PLT 158 174   BMET  Recent Labs  04/22/13 0441 04/23/13 0424  NA 138 142  K 3.8 4.0  CL 107 110  CO2 23 25  GLUCOSE 105* 156*  BUN 33* 16  CREATININE 2.11* 1.73*  CALCIUM 8.2* 8.3*   PT/INR No results found for this basename: LABPROT, INR,  in the last 72 hours  Studies/Results: No results found.  Anti-infectives: Anti-infectives   Start     Dose/Rate Route Frequency Ordered Stop   04/21/13 1200  clindamycin (CLEOCIN) IVPB 600 mg     600 mg 100 mL/hr over 30 Minutes Intravenous Every 8 hours 04/21/13 1054     04/21/13 1000  vancomycin (VANCOCIN) 1,250 mg in sodium chloride 0.9 % 250 mL IVPB     1,250 mg 166.7 mL/hr over 90 Minutes Intravenous Every 24 hours 04/20/13 1809     04/20/13 1830   piperacillin-tazobactam (ZOSYN) IVPB 3.375 g     3.375 g 12.5 mL/hr over 240 Minutes Intravenous Every 8 hours 04/20/13 1737     04/20/13 1045  piperacillin-tazobactam (ZOSYN) IVPB 3.375 g  Status:  Discontinued     3.375 g 12.5 mL/hr over 240 Minutes Intravenous  Once 04/20/13 1035 04/20/13 1036   04/20/13 1045  piperacillin-tazobactam (ZOSYN) IVPB 3.375 g     3.375 g 100 mL/hr over 30 Minutes Intravenous  Once 04/20/13 1036 04/20/13 1120   04/20/13 1000  vancomycin (VANCOCIN) IVPB 1000 mg/200 mL premix     1,000 mg 200 mL/hr over 60 Minutes Intravenous  Once 04/20/13 0953 04/20/13 1230   04/20/13 1000  piperacillin-tazobactam (ZOSYN) IVPB 3.375 g  Status:  Discontinued     3.375 g 12.5 mL/hr over 240 Minutes Intravenous  Once 04/20/13 0953 04/20/13 1035      Assessment/Plan: s/p Procedure(s): SHARP SURGICAL DEBRIDEMENT PERINEAL INFECTION INSERTION CENTRAL LINE LEFT SUBCLAVIAN Impression: Patient continues to improve status post surgical debridement of Fournier gangrene. His renal insufficiency is improving. We'll leave Foley and do to wound care management and renal insufficiency. Aerobic culture showed gram-positive cocci in pairs. Anaerobic cultures negative. Blood cultures negative. Leukocytosis is resolving. We'll continue antibiotics for now, though may be able to back off on vancomycin therapy. Continue ICU care do to frequent dressing changes.  LOS: 3 days    Karman Veney A 04/23/2013

## 2013-04-24 LAB — CBC
HCT: 28.2 % — ABNORMAL LOW (ref 39.0–52.0)
Hemoglobin: 9.6 g/dL — ABNORMAL LOW (ref 13.0–17.0)
MCH: 29.4 pg (ref 26.0–34.0)
MCHC: 34 g/dL (ref 30.0–36.0)
MCV: 86.5 fL (ref 78.0–100.0)
Platelets: 178 10*3/uL (ref 150–400)
RBC: 3.26 MIL/uL — ABNORMAL LOW (ref 4.22–5.81)
RDW: 15.2 % (ref 11.5–15.5)
WBC: 10.2 10*3/uL (ref 4.0–10.5)

## 2013-04-24 LAB — BASIC METABOLIC PANEL
BUN: 12 mg/dL (ref 6–23)
CO2: 23 mEq/L (ref 19–32)
Calcium: 8.2 mg/dL — ABNORMAL LOW (ref 8.4–10.5)
Chloride: 109 mEq/L (ref 96–112)
Creatinine, Ser: 1.48 mg/dL — ABNORMAL HIGH (ref 0.50–1.35)
GFR calc Af Amer: 59 mL/min — ABNORMAL LOW (ref >90)
GFR calc non Af Amer: 51 mL/min — ABNORMAL LOW (ref >90)
Glucose, Bld: 161 mg/dL — ABNORMAL HIGH (ref 70–99)
Potassium: 3.5 mEq/L (ref 3.5–5.1)
Sodium: 140 mEq/L (ref 135–145)

## 2013-04-24 MED ORDER — PANTOPRAZOLE SODIUM 40 MG PO TBEC
40.0000 mg | DELAYED_RELEASE_TABLET | Freq: Every day | ORAL | Status: DC
  Administered 2013-04-24 – 2013-04-29 (×6): 40 mg via ORAL
  Filled 2013-04-24 (×6): qty 1

## 2013-04-24 MED ORDER — VANCOMYCIN HCL 10 G IV SOLR
1250.0000 mg | Freq: Two times a day (BID) | INTRAVENOUS | Status: DC
  Administered 2013-04-24: 1250 mg via INTRAVENOUS
  Filled 2013-04-24 (×2): qty 1250

## 2013-04-24 NOTE — Progress Notes (Signed)
Physical Therapy Wound Treatment Patient Details  Name: Shawn Fleming MRN: [id number] Date of Birth: September 25, 1956  Today's Date: 04/24/2013 Time: 1100-1206 Time Calculation (min): 66 min  Subjective  Subjective: breakfast was awful  Pain Score: Pain Score:   2  Wound Assessment  Incision 04/20/13 Perineum Bilateral (Active)  Site / Wound Assessment Shawn Fleming;Red 04/24/2013  1:40 PM  Margins Unattacted edges (unapproximated) 04/24/2013  1:40 PM  Closure None 04/24/2013  1:40 PM  Drainage Amount Minimal 04/24/2013  1:40 PM  Drainage Description Serosanguineous 04/24/2013  1:40 PM  Treatment Cleansed 04/24/2013  1:40 PM  Dressing Type ABD;Moist to moist 04/24/2013  1:40 PM  Dressing Changed 04/24/2013  1:40 PM     Incision 04/20/13 Chest Left (Active)  Site / Wound Assessment Clean;Dry 04/22/2013  8:05 AM  Drainage Amount None 04/22/2013  8:05 AM  Dressing Type Transparent dressing 04/22/2013  8:05 AM  Dressing Clean;Dry;Intact 04/22/2013  8:05 AM       Wound Assessment and Plan  Wound Therapy - Assess/Plan/Recommendations Wound Therapy - Clinical Statement: We are continuing with PL to all wounds in perineal region.  There appears to be significant decrease in the depth of all wounds, noted by the lesser amount of gauze used in dressings.  The most central wound has Shawn Fleming slough at the wound periphery, however this area is the most vulnerable to stool exiting from the anus.  Pt had significantly more pain today, even though premedicated and then given more medication during tx.  He was able to tolerate prone lying position with pillows under the abdomen for tx. Hydrotherapy Plan: Debridement;Dressing change;Pulsatile lavage with suction Wound Plan: continue as above  Wound Therapy Goals- Improve the function of patient's integumentary system by progressing the wound(s) through the phases of wound healing (inflammation - proliferation - remodeling) by: Decrease Length/Width/Depth - Progress: Progressing toward  goal  Goals will be updated until maximal potential achieved or discharge criteria met.  Discharge criteria: when goals achieved, discharge from hospital, MD decision/surgical intervention, no progress towards goals, refusal/missing three consecutive treatments without notification or medical reason.  GP     Shawn Fleming 04/24/2013, 1:53 PM

## 2013-04-24 NOTE — Progress Notes (Signed)
ANTIBIOTIC CONSULT NOTE  Pharmacy Consult for Vancomycin, Clindamycin, Zosyn Indication: Fournier's gangrene/ peri-rectal abscess  No Known Allergies  Patient Measurements: Height: 5\' 6"  (167.6 cm) Weight: 234 lb 9.1 oz (106.4 kg) IBW/kg (Calculated) : 63.8  Vital Signs: Temp: 99.2 F (37.3 C) (05/08 0800) Temp src: Oral (05/08 0800) BP: 130/76 mmHg (05/08 0900) Pulse Rate: 88 (05/08 0900) Intake/Output from previous day: 05/07 0701 - 05/08 0700 In: 3637.9 [P.O.:1300; I.V.:1687.9; IV Piggyback:650] Out: 4201 [Urine:4200; Stool:1] Intake/Output from this shift: Total I/O In: 150 [I.V.:150] Out: 1000 [Urine:1000]  Labs:  Recent Labs  04/22/13 0441 04/23/13 0424 04/24/13 0449  WBC 14.9* 12.9* 10.2  HGB 9.0* 8.9* 9.6*  PLT 158 174 178  CREATININE 2.11* 1.73* 1.48*   Estimated Creatinine Clearance: 62.9 ml/min (by C-G formula based on Cr of 1.48). No results found for this basename: VANCOTROUGH, Leodis Binet, VANCORANDOM, GENTTROUGH, GENTPEAK, GENTRANDOM, TOBRATROUGH, TOBRAPEAK, TOBRARND, AMIKACINPEAK, AMIKACINTROU, AMIKACIN,  in the last 72 hours   Microbiology: Recent Results (from the past 720 hour(s))  CULTURE, BLOOD (ROUTINE X 2)     Status: None   Collection Time    04/20/13 10:15 AM      Result Value Range Status   Specimen Description BLOOD RIGHT ARM   Final   Special Requests BOTTLES DRAWN AEROBIC AND ANAEROBIC 8CC EACH   Final   Culture NO GROWTH 4 DAYS   Final   Report Status PENDING   Incomplete  CULTURE, BLOOD (ROUTINE X 2)     Status: None   Collection Time    04/20/13 10:35 AM      Result Value Range Status   Specimen Description BLOOD LEFT HAND   Final   Special Requests BOTTLES DRAWN AEROBIC AND ANAEROBIC 8CC   Final   Culture NO GROWTH 4 DAYS   Final   Report Status PENDING   Incomplete  URINE CULTURE     Status: None   Collection Time    04/20/13 11:07 AM      Result Value Range Status   Specimen Description URINE, CLEAN CATCH   Final   Special Requests NONE   Final   Culture  Setup Time 04/20/2013 20:09   Final   Colony Count NO GROWTH   Final   Culture NO GROWTH   Final   Report Status 04/21/2013 FINAL   Final  ANAEROBIC CULTURE     Status: None   Collection Time    04/20/13  4:30 PM      Result Value Range Status   Specimen Description ABSCESS PERINEAL   Final   Special Requests NONE   Final   Gram Stain     Final   Value: NO WBC SEEN     NO SQUAMOUS EPITHELIAL CELLS SEEN     FEW GRAM POSITIVE COCCI IN PAIRS   Culture     Final   Value: NO ANAEROBES ISOLATED; CULTURE IN PROGRESS FOR 5 DAYS   Report Status PENDING   Incomplete  CULTURE, ROUTINE-ABSCESS     Status: None   Collection Time    04/20/13  4:30 PM      Result Value Range Status   Specimen Description ABSCESS PERIRECTAL   Final   Special Requests NONE   Final   Gram Stain     Final   Value: NO WBC SEEN     NO SQUAMOUS EPITHELIAL CELLS SEEN     MODERATE GRAM POSITIVE COCCI IN PAIRS   Culture     Final  Value: MODERATE MICROAEROPHILIC STREPTOCOCCI     Note: Standardized susceptibility testing for this organism is not available.   Report Status 04/23/2013 FINAL   Final  MRSA PCR SCREENING     Status: None   Collection Time    04/22/13  6:23 AM      Result Value Range Status   MRSA by PCR NEGATIVE  NEGATIVE Final   Comment:            The GeneXpert MRSA Assay (FDA     approved for NASAL specimens     only), is one component of a     comprehensive MRSA colonization     surveillance program. It is not     intended to diagnose MRSA     infection nor to guide or     monitor treatment for     MRSA infections.   Medical History: Past Medical History  Diagnosis Date  . Kidney calculi   . Migraine    Medications:  Scheduled:  . clindamycin (CLEOCIN) IV  600 mg Intravenous Q8H  . escitalopram  10 mg Oral Daily  . pantoprazole  40 mg Oral Q1200  . piperacillin-tazobactam (ZOSYN)  IV  3.375 g Intravenous Q8H  . pregabalin  75 mg Oral BID  .  vancomycin  1,250 mg Intravenous Q24H  . [DISCONTINUED] pantoprazole (PROTONIX) IV  40 mg Intravenous QHS   Assessment: 57 yo obese M with Fournier's gangrene s/p emergent I&D.  He is on Vanc & Zosyn & Clindamycin.  Renal fxn is steadily improving.    WBC trending down.   Afebrile and improving clinically.  Estimated Creatinine Clearance: 62.9 ml/min (by C-G formula based on Cr of 1.48).  Awaiting final cultures, ID and sens.     Goal of Therapy:  Vancomycin trough level 15-20 mcg/ml  Plan:  1) Modify Vancomycin to 1250mg  IV q12h (renal fxn improved) 2) Zosyn 3.375gm IV Q8h to be infused over 4hrs 3) Clindamycin 600mg  IV Q8h 4) Check Vancomycin trough tomorrow 5) Monitor renal function and cx data   Valrie Hart A 04/24/2013,11:03 AM

## 2013-04-24 NOTE — Progress Notes (Signed)
4 Days Post-Op  Subjective: Resting comfortably.  Objective: Vital signs in last 24 hours: Temp:  [97.8 F (36.6 C)-99.4 F (37.4 C)] 97.8 F (36.6 C) (05/08 0400) Pulse Rate:  [53-105] 97 (05/08 0500) Resp:  [10-21] 15 (05/08 0500) BP: (113-146)/(51-90) 134/81 mmHg (05/08 0500) SpO2:  [92 %-100 %] 96 % (05/08 0500) Weight:  [106.4 kg (234 lb 9.1 oz)] 106.4 kg (234 lb 9.1 oz) (05/08 0500) Last BM Date: 04/23/13  Intake/Output from previous day: 05/07 0701 - 05/08 0700 In: 3562.9 [P.O.:1300; I.V.:1612.9; IV Piggyback:650] Out: 4201 [Urine:4200; Stool:1] Intake/Output this shift:    General appearance: alert, cooperative and no distress Resp: clear to auscultation bilaterally Cardio: regular rate and rhythm, S1, S2 normal, no murmur, click, rub or gallop GI: Soft. Perineal wounds are healing by secondary intention. Minimal necrotic tissue seen.  Lab Results:   Recent Labs  04/23/13 0424 04/24/13 0449  WBC 12.9* 10.2  HGB 8.9* 9.6*  HCT 26.2* 28.2*  PLT 174 178   BMET  Recent Labs  04/23/13 0424 04/24/13 0449  NA 142 140  K 4.0 3.5  CL 110 109  CO2 25 23  GLUCOSE 156* 161*  BUN 16 12  CREATININE 1.73* 1.48*  CALCIUM 8.3* 8.2*   PT/INR No results found for this basename: LABPROT, INR,  in the last 72 hours  Studies/Results: No results found.  Anti-infectives: Anti-infectives   Start     Dose/Rate Route Frequency Ordered Stop   04/21/13 1200  clindamycin (CLEOCIN) IVPB 600 mg     600 mg 100 mL/hr over 30 Minutes Intravenous Every 8 hours 04/21/13 1054     04/21/13 1000  vancomycin (VANCOCIN) 1,250 mg in sodium chloride 0.9 % 250 mL IVPB     1,250 mg 166.7 mL/hr over 90 Minutes Intravenous Every 24 hours 04/20/13 1809     04/20/13 1830  piperacillin-tazobactam (ZOSYN) IVPB 3.375 g     3.375 g 12.5 mL/hr over 240 Minutes Intravenous Every 8 hours 04/20/13 1737     04/20/13 1045  piperacillin-tazobactam (ZOSYN) IVPB 3.375 g  Status:  Discontinued     3.375 g 12.5 mL/hr over 240 Minutes Intravenous  Once 04/20/13 1035 04/20/13 1036   04/20/13 1045  piperacillin-tazobactam (ZOSYN) IVPB 3.375 g     3.375 g 100 mL/hr over 30 Minutes Intravenous  Once 04/20/13 1036 04/20/13 1120   04/20/13 1000  vancomycin (VANCOCIN) IVPB 1000 mg/200 mL premix     1,000 mg 200 mL/hr over 60 Minutes Intravenous  Once 04/20/13 0953 04/20/13 1230   04/20/13 1000  piperacillin-tazobactam (ZOSYN) IVPB 3.375 g  Status:  Discontinued     3.375 g 12.5 mL/hr over 240 Minutes Intravenous  Once 04/20/13 0953 04/20/13 1035      Assessment/Plan: s/p Procedure(s): SHARP SURGICAL DEBRIDEMENT PERINEAL INFECTION INSERTION CENTRAL LINE LEFT SUBCLAVIAN Impression: Postoperative day 4, status post extensive incision and debridement of perineal Fournier's gangrene. Leukocytosis resolved. Renal insufficiency almost resolved. Patient is tolerating pulse lavage well. Given how well he is tolerating wound care at bedside, I do not feel he needs to go back to the operating room for further surgical debridement this time. This may need to be done in the future. Patient is stable enough to be transferred to regular floor. Need to leave Foley in 2 to renal insufficiency and wound care management due to perineal wounds.  LOS: 4 days    Shawn Fleming A 04/24/2013

## 2013-04-25 ENCOUNTER — Inpatient Hospital Stay (HOSPITAL_COMMUNITY)

## 2013-04-25 LAB — CBC
HCT: 29.8 % — ABNORMAL LOW (ref 39.0–52.0)
Hemoglobin: 10.3 g/dL — ABNORMAL LOW (ref 13.0–17.0)
MCH: 29.5 pg (ref 26.0–34.0)
MCHC: 34.6 g/dL (ref 30.0–36.0)
MCV: 85.4 fL (ref 78.0–100.0)
Platelets: 200 10*3/uL (ref 150–400)
RBC: 3.49 MIL/uL — ABNORMAL LOW (ref 4.22–5.81)
RDW: 15.3 % (ref 11.5–15.5)
WBC: 11.8 10*3/uL — ABNORMAL HIGH (ref 4.0–10.5)

## 2013-04-25 LAB — CULTURE, BLOOD (ROUTINE X 2)
Culture: NO GROWTH
Culture: NO GROWTH

## 2013-04-25 LAB — BASIC METABOLIC PANEL
BUN: 9 mg/dL (ref 6–23)
CO2: 23 mEq/L (ref 19–32)
Calcium: 8.6 mg/dL (ref 8.4–10.5)
Chloride: 109 mEq/L (ref 96–112)
Creatinine, Ser: 1.3 mg/dL (ref 0.50–1.35)
GFR calc Af Amer: 69 mL/min — ABNORMAL LOW (ref >90)
GFR calc non Af Amer: 59 mL/min — ABNORMAL LOW (ref >90)
Glucose, Bld: 93 mg/dL (ref 70–99)
Potassium: 3.6 mEq/L (ref 3.5–5.1)
Sodium: 143 mEq/L (ref 135–145)

## 2013-04-25 LAB — ANAEROBIC CULTURE: Gram Stain: NONE SEEN

## 2013-04-25 LAB — VANCOMYCIN, TROUGH: Vancomycin Tr: 19.9 ug/mL (ref 10.0–20.0)

## 2013-04-25 MED ORDER — SODIUM CHLORIDE 0.9 % IJ SOLN: 10.0000 mL | INTRAMUSCULAR | Status: DC | PRN

## 2013-04-25 MED ORDER — CLINDAMYCIN PHOSPHATE 900 MG/50ML IV SOLN
900.0000 mg | Freq: Three times a day (TID) | INTRAVENOUS | Status: DC
  Administered 2013-04-25 – 2013-04-26 (×3): 900 mg via INTRAVENOUS
  Filled 2013-04-25 (×10): qty 50

## 2013-04-25 MED ORDER — SODIUM CHLORIDE 0.9 % IJ SOLN
10.0000 mL | Freq: Two times a day (BID) | INTRAMUSCULAR | Status: DC
  Administered 2013-04-25: 20 mL
  Administered 2013-04-26 – 2013-04-28 (×4): 10 mL

## 2013-04-25 NOTE — Progress Notes (Signed)
ANTIBIOTIC CONSULT NOTE  Pharmacy Consult for Clindamycin and Zosyn Indication: Fournier's gangrene/ peri-rectal abscess  No Known Allergies  Patient Measurements: Height: 5\' 6"  (167.6 cm) Weight: 234 lb 9.1 oz (106.4 kg) IBW/kg (Calculated) : 63.8  Vital Signs: Temp: 97.9 F (36.6 C) (05/09 0502) Temp src: Oral (05/09 0502) BP: 133/83 mmHg (05/09 0502) Pulse Rate: 88 (05/09 0502) Intake/Output from previous day: 05/08 0701 - 05/09 0700 In: 1481.7 [I.V.:1231.7; IV Piggyback:250] Out: 2500 [Urine:2500] Intake/Output from this shift:    Labs:  Recent Labs  04/23/13 0424 04/24/13 0449 04/25/13 0440  WBC 12.9* 10.2 11.8*  HGB 8.9* 9.6* 10.3*  PLT 174 178 200  CREATININE 1.73* 1.48* 1.30   Estimated Creatinine Clearance: 71.6 ml/min (by C-G formula based on Cr of 1.3).  Recent Labs  04/25/13 0854  VANCOTROUGH 19.9    Microbiology: Recent Results (from the past 720 hour(s))  CULTURE, BLOOD (ROUTINE X 2)     Status: None   Collection Time    04/20/13 10:15 AM      Result Value Range Status   Specimen Description BLOOD RIGHT ARM   Final   Special Requests BOTTLES DRAWN AEROBIC AND ANAEROBIC 8CC EACH   Final   Culture NO GROWTH 5 DAYS   Final   Report Status 04/25/2013 FINAL   Final  CULTURE, BLOOD (ROUTINE X 2)     Status: None   Collection Time    04/20/13 10:35 AM      Result Value Range Status   Specimen Description BLOOD LEFT HAND   Final   Special Requests BOTTLES DRAWN AEROBIC AND ANAEROBIC 8CC   Final   Culture NO GROWTH 5 DAYS   Final   Report Status 04/25/2013 FINAL   Final  URINE CULTURE     Status: None   Collection Time    04/20/13 11:07 AM      Result Value Range Status   Specimen Description URINE, CLEAN CATCH   Final   Special Requests NONE   Final   Culture  Setup Time 04/20/2013 20:09   Final   Colony Count NO GROWTH   Final   Culture NO GROWTH   Final   Report Status 04/21/2013 FINAL   Final  ANAEROBIC CULTURE     Status: None   Collection Time    04/20/13  4:30 PM      Result Value Range Status   Specimen Description ABSCESS PERINEAL   Final   Special Requests NONE   Final   Gram Stain     Final   Value: NO WBC SEEN     NO SQUAMOUS EPITHELIAL CELLS SEEN     FEW GRAM POSITIVE COCCI IN PAIRS   Culture     Final   Value: NO ANAEROBES ISOLATED; CULTURE IN PROGRESS FOR 5 DAYS   Report Status PENDING   Incomplete  CULTURE, ROUTINE-ABSCESS     Status: None   Collection Time    04/20/13  4:30 PM      Result Value Range Status   Specimen Description ABSCESS PERIRECTAL   Final   Special Requests NONE   Final   Gram Stain     Final   Value: NO WBC SEEN     NO SQUAMOUS EPITHELIAL CELLS SEEN     MODERATE GRAM POSITIVE COCCI IN PAIRS   Culture     Final   Value: MODERATE MICROAEROPHILIC STREPTOCOCCI     Note: Standardized susceptibility testing for this organism is not available.  Report Status 04/23/2013 FINAL   Final  MRSA PCR SCREENING     Status: None   Collection Time    04/22/13  6:23 AM      Result Value Range Status   MRSA by PCR NEGATIVE  NEGATIVE Final   Comment:            The GeneXpert MRSA Assay (FDA     approved for NASAL specimens     only), is one component of a     comprehensive MRSA colonization     surveillance program. It is not     intended to diagnose MRSA     infection nor to guide or     monitor treatment for     MRSA infections.   Medical History: Past Medical History  Diagnosis Date  . Kidney calculi   . Migraine    Medications:  Scheduled:  . clindamycin (CLEOCIN) IV  900 mg Intravenous Q8H  . escitalopram  10 mg Oral Daily  . pantoprazole  40 mg Oral Q1200  . piperacillin-tazobactam (ZOSYN)  IV  3.375 g Intravenous Q8H  . pregabalin  75 mg Oral BID  . [DISCONTINUED] clindamycin (CLEOCIN) IV  600 mg Intravenous Q8H  . [DISCONTINUED] vancomycin  1,250 mg Intravenous Q24H  . [DISCONTINUED] vancomycin  1,250 mg Intravenous Q12H   Assessment: 57 yo obese M with  Fournier's gangrene s/p emergent I&D.  Vancomycin was d/c'd today (trough was on target) and continues on Zosyn & Clindamycin.  Renal fxn has improved.    Afebrile and improving clinically.  Estimated Creatinine Clearance: 71.6 ml/min (by C-G formula based on Cr of 1.3).   Cultures reviewed.     Goal of Therapy:  Vancomycin trough level 15-20 mcg/ml  Plan:  Zosyn 3.375gm IV Q8h to be infused over 4hrs increase Clindamycin to 900mg  IV Q8h Monitor renal function and cx data  Duration of therapy per MD  Valrie Hart A 04/25/2013,10:30 AM

## 2013-04-25 NOTE — Progress Notes (Signed)
Physical Therapy Wound Treatment Patient Details  Name: Shawn Fleming MRN: [id number] Date of Birth: [birth year]  Today's Date: 04/25/2013 Time: 4782-9562 Time Calculation (min): 100 min  Subjective  Subjective: no c/o  Pain Score: Pain Score:   6  Wound Assessment  Incision 04/20/13 Perineum Bilateral (Active)  Site / Wound Assessment Granulation tissue;Red 04/25/2013 11:44 AM  Margins Unattacted edges (unapproximated) 04/25/2013 11:44 AM  Closure None 04/25/2013 11:44 AM  Drainage Amount Moderate 04/25/2013 11:44 AM  Drainage Description Serosanguineous 04/25/2013 11:44 AM  Treatment Cleansed 04/25/2013 11:44 AM  Dressing Type ABD;Moist to moist 04/25/2013 11:44 AM  Dressing Changed 04/25/2013 11:46 AM     Incision 04/20/13 Chest Left (Active)  Site / Wound Assessment Clean;Dry 04/22/2013  8:05 AM  Drainage Amount None 04/22/2013  8:05 AM  Dressing Type Transparent dressing 04/22/2013  8:05 AM  Dressing Clean;Dry;Intact 04/22/2013  8:05 AM       Wound Assessment and Plan  Wound Therapy - Assess/Plan/Recommendations Wound Therapy - Clinical Statement: There continues to be no odor or abnormal drainage from any wound.  The Shawn Fleming slough around one of the incisions that was present yesterday is gone.  There was some stool still in the wound from previous bowel movement. The entire region was cleansed with sterile saline and the old packing was moitened with saline prior to its removal in order to reduce pain.  He has what appears to be a rash on the medial portion of both thighs and this feels irritating to pt.  RN has inspected this.  PL was done to all wounds.  Several of them are very contiguous to adjacent wounds but no tunneling was found.   Pt seemed to have increased pain with tx and he needed to have frequent rests during treatment to recompose himself (maximal pain med had been given).        Hydrotherapy Plan: Dressing change;Pulsatile lavage with suction Wound Therapy - Frequency: 6X / week Wound  Plan: continue as above using no tape on skin  Wound Therapy Goals- Improve the function of patient's integumentary system by progressing the wound(s) through the phases of wound healing (inflammation - proliferation - remodeling) by: Decrease Length/Width/Depth - Progress: Progressing toward goal  Goals will be updated until maximal potential achieved or discharge criteria met.  Discharge criteria: when goals achieved, discharge from hospital, MD decision/surgical intervention, no progress towards goals, refusal/missing three consecutive treatments without notification or medical reason.  GP     Shawn Fleming L 04/25/2013, 11:55 AM

## 2013-04-25 NOTE — Progress Notes (Signed)
5 Days Post-Op  Subjective: Seems more alert today.  Objective: Vital signs in last 24 hours: Temp:  [97.9 F (36.6 C)-99 F (37.2 C)] 97.9 F (36.6 C) (05/09 0502) Pulse Rate:  [88-90] 88 (05/09 0502) Resp:  [17-18] 18 (05/09 0502) BP: (122-133)/(78-83) 133/83 mmHg (05/09 0502) SpO2:  [95 %] 95 % (05/09 0502) Last BM Date: 04/24/13  Intake/Output from previous day: 05/08 0701 - 05/09 0700 In: 1481.7 [I.V.:1231.7; IV Piggyback:250] Out: 2500 [Urine:2500] Intake/Output this shift:    General appearance: alert, cooperative and no distress Resp: clear to auscultation bilaterally Cardio: regular rate and rhythm, S1, S2 normal, no murmur, click, rub or gallop Skin: Dressings intact. Awaiting pulse lavage therapy from physical therapy.  Lab Results:   Recent Labs  04/24/13 0449 04/25/13 0440  WBC 10.2 11.8*  HGB 9.6* 10.3*  HCT 28.2* 29.8*  PLT 178 200   BMET  Recent Labs  04/24/13 0449 04/25/13 0440  NA 140 143  K 3.5 3.6  CL 109 109  CO2 23 23  GLUCOSE 161* 93  BUN 12 9  CREATININE 1.48* 1.30  CALCIUM 8.2* 8.6   PT/INR No results found for this basename: LABPROT, INR,  in the last 72 hours  Studies/Results: No results found.  Anti-infectives: Anti-infectives   Start     Dose/Rate Route Frequency Ordered Stop   04/24/13 2200  vancomycin (VANCOCIN) 1,250 mg in sodium chloride 0.9 % 250 mL IVPB  Status:  Discontinued     1,250 mg 166.7 mL/hr over 90 Minutes Intravenous Every 12 hours 04/24/13 1108 04/25/13 0928   04/21/13 1200  clindamycin (CLEOCIN) IVPB 600 mg     600 mg 100 mL/hr over 30 Minutes Intravenous Every 8 hours 04/21/13 1054     04/21/13 1000  vancomycin (VANCOCIN) 1,250 mg in sodium chloride 0.9 % 250 mL IVPB  Status:  Discontinued     1,250 mg 166.7 mL/hr over 90 Minutes Intravenous Every 24 hours 04/20/13 1809 04/24/13 1108   04/20/13 1830  piperacillin-tazobactam (ZOSYN) IVPB 3.375 g     3.375 g 12.5 mL/hr over 240 Minutes Intravenous  Every 8 hours 04/20/13 1737     04/20/13 1045  piperacillin-tazobactam (ZOSYN) IVPB 3.375 g  Status:  Discontinued     3.375 g 12.5 mL/hr over 240 Minutes Intravenous  Once 04/20/13 1035 04/20/13 1036   04/20/13 1045  piperacillin-tazobactam (ZOSYN) IVPB 3.375 g     3.375 g 100 mL/hr over 30 Minutes Intravenous  Once 04/20/13 1036 04/20/13 1120   04/20/13 1000  vancomycin (VANCOCIN) IVPB 1000 mg/200 mL premix     1,000 mg 200 mL/hr over 60 Minutes Intravenous  Once 04/20/13 0953 04/20/13 1230   04/20/13 1000  piperacillin-tazobactam (ZOSYN) IVPB 3.375 g  Status:  Discontinued     3.375 g 12.5 mL/hr over 240 Minutes Intravenous  Once 04/20/13 0953 04/20/13 1035      Assessment/Plan: s/p Procedure(s): SHARP SURGICAL DEBRIDEMENT PERINEAL INFECTION INSERTION CENTRAL LINE LEFT SUBCLAVIAN Impression: Patient is recovering well, status post extensive debridement of Fournier's gangrene. Final cultures reveal and benign streptococcus. Will stop vancomycin. Renal function has normalized. Agree with referral to LTAC for placement and ongoing wound care as this is probably too extensive to be done at home. We'll remove Foley.   LOS: 5 days    Javontae Marlette A 04/25/2013

## 2013-04-25 NOTE — Care Management Note (Signed)
    Page 1 of 2   04/30/2013     11:36:52 AM   CARE MANAGEMENT NOTE 04/30/2013  Patient:  Shawn Fleming, Shawn Fleming   Account Number:  [id number]  Date Initiated:  04/24/2013  Documentation initiated by:  Anibal Henderson  Subjective/Objective Assessment:   Pt is from home, lives alone, is independent, still works. He will need long term wound care and IV ABX. He says there is no one available to assist at home     Action/Plan:   discussed and will check for LTAC and SNF benefits, and HH also, in case he is able to return home and care for these things alone, with NN to assist   Anticipated DC Date:  04/26/2013   Anticipated DC Plan:  LONG TERM ACUTE CARE (LTAC)      DC Planning Services  CM consult      PAC Choice  LONG TERM ACUTE CARE   Choice offered to / List presented to:  C-1 Patient           Status of service:  Completed, signed off Medicare Important Message given?   (If response is "NO", the following Medicare IM given date fields will be blank) Date Medicare IM given:   Date Additional Medicare IM given:    Discharge Disposition:    Per UR Regulation:  Reviewed for med. necessity/level of care/duration of stay  If discussed at Long Length of Stay Meetings, dates discussed:   04/29/2013    Comments:  04/29/13 1615 Anibal Henderson RN Recieved call from [facility] that ins has approved move to LTAC. Assisted with transfer to [facility] 04/28/13 1600 Anibal Henderson RN [facility] still have not heard from [employer] for approval of LTAC 04/25/13 1700 Anibal Henderson RN Pt has been approved by the [place], but they have neither one gotten approval from the insurance as of 5pm, therefore will not get this before Monday 04/25/13 1100 Anibal Henderson RN/CM pt agrees to referral to LTAC, both [facility] to review for appropriateness and for ins coverage. 1400 Spoke with patient's ex- wife at his request, in room, per speaker cell phone, to explain all this to her, because he  trusts her judgement. 04/24/13 1100 Anibal Henderson RN/CM

## 2013-04-26 LAB — CBC
HCT: 28.8 % — ABNORMAL LOW (ref 39.0–52.0)
Hemoglobin: 10 g/dL — ABNORMAL LOW (ref 13.0–17.0)
MCH: 29.7 pg (ref 26.0–34.0)
MCHC: 34.7 g/dL (ref 30.0–36.0)
MCV: 85.5 fL (ref 78.0–100.0)
Platelets: 194 10*3/uL (ref 150–400)
RBC: 3.37 MIL/uL — ABNORMAL LOW (ref 4.22–5.81)
RDW: 15.1 % (ref 11.5–15.5)
WBC: 15.3 10*3/uL — ABNORMAL HIGH (ref 4.0–10.5)

## 2013-04-26 LAB — BASIC METABOLIC PANEL
BUN: 10 mg/dL (ref 6–23)
CO2: 26 mEq/L (ref 19–32)
Calcium: 8.7 mg/dL (ref 8.4–10.5)
Chloride: 104 mEq/L (ref 96–112)
Creatinine, Ser: 1.37 mg/dL — ABNORMAL HIGH (ref 0.50–1.35)
GFR calc Af Amer: 65 mL/min — ABNORMAL LOW (ref >90)
GFR calc non Af Amer: 56 mL/min — ABNORMAL LOW (ref >90)
Glucose, Bld: 128 mg/dL — ABNORMAL HIGH (ref 70–99)
Potassium: 3.7 mEq/L (ref 3.5–5.1)
Sodium: 139 mEq/L (ref 135–145)

## 2013-04-26 MED ORDER — AMPICILLIN-SULBACTAM SODIUM 3 (2-1) G IJ SOLR
3.0000 g | Freq: Four times a day (QID) | INTRAMUSCULAR | Status: DC
  Administered 2013-04-26 – 2013-04-29 (×14): 3 g via INTRAVENOUS
  Filled 2013-04-26 (×17): qty 3

## 2013-04-26 NOTE — Progress Notes (Signed)
6 Days Post-Op  Subjective: Undergoing pulse lavage therapy to perineum.  Objective: Vital signs in last 24 hours: Temp:  [97.8 F (36.6 C)-98.2 F (36.8 C)] 98 F (36.7 C) (05/10 0528) Pulse Rate:  [86-103] 95 (05/10 0528) Resp:  [18-20] 18 (05/10 0528) BP: (114-133)/(67-86) 114/68 mmHg (05/10 0528) SpO2:  [93 %-96 %] 95 % (05/10 0528) Last BM Date: 04/26/13  Intake/Output from previous day: 05/09 0701 - 05/10 0700 In: 1113 [P.O.:240; I.V.:573; IV Piggyback:300] Out: 650 [Urine:650] Intake/Output this shift: Total I/O In: 120 [P.O.:120] Out: -   Skin: Perineal wounds are granulating in nicely. Minimal necrotic tissue present. No significant purulent drainage noted.  Lab Results:   Recent Labs  04/25/13 0440 04/26/13 0643  WBC 11.8* 15.3*  HGB 10.3* 10.0*  HCT 29.8* 28.8*  PLT 200 194   BMET  Recent Labs  04/25/13 0440 04/26/13 0643  NA 143 139  K 3.6 3.7  CL 109 104  CO2 23 26  GLUCOSE 93 128*  BUN 9 10  CREATININE 1.30 1.37*  CALCIUM 8.6 8.7   PT/INR No results found for this basename: LABPROT, INR,  in the last 72 hours  Studies/Results: Dg Chest Port 1 View  04/25/2013  *RADIOLOGY REPORT*  Clinical Data: PICC line placement.  PORTABLE CHEST - 1 VIEW  Comparison: Film earlier this date  Findings: Fleming right PICC line is identified with tip overlying the lower SVC. Fleming left subclavian central venous catheter is present with tip overlying the brachiocephalic - SVC junction. The cardiomediastinal silhouette is unremarkable. There is no evidence of focal airspace disease, pulmonary edema, suspicious pulmonary nodule/mass, pleural effusion, or pneumothorax. No acute bony abnormalities are identified.  IMPRESSION: Right PICC line with tip overlying the lower SVC.  Left subclavian central venous catheter with tip overlying the brachiocephalic - SVC junction.  No evidence of acute cardiopulmonary disease.   Original Report Authenticated By: Harmon Pier, M.D.    Dg  Chest Port 1 View  04/25/2013  *RADIOLOGY REPORT*  Clinical Data: PICC line placement  PORTABLE CHEST - 1 VIEW  Comparison: 04/20/2013  Findings: Right-sided PICC line tip over high right atrium, 6 cm below the carina.  Heart size is normal.  Lung volumes are low.  No pneumothorax.  No pleural effusion.  Previously seen left-sided subclavian approach central line tip terminates in the brachiocephalic/SVC junction.  Improved left upper lobe aeration.  IMPRESSION: Right-sided PICC line tip terminates over the high right atrium. This could be pulled back 1 cm if cavoatrial positioning is desired.   Original Report Authenticated By: Christiana Pellant, M.D.     Anti-infectives: Anti-infectives   Start     Dose/Rate Route Frequency Ordered Stop   04/26/13 1200  Ampicillin-Sulbactam (UNASYN) 3 g in sodium chloride 0.9 % 100 mL IVPB     3 g 100 mL/hr over 60 Minutes Intravenous Every 6 hours 04/26/13 1149     04/25/13 1200  clindamycin (CLEOCIN) IVPB 900 mg  Status:  Discontinued     900 mg 100 mL/hr over 30 Minutes Intravenous Every 8 hours 04/25/13 1028 04/26/13 1149   04/24/13 2200  vancomycin (VANCOCIN) 1,250 mg in sodium chloride 0.9 % 250 mL IVPB  Status:  Discontinued     1,250 mg 166.7 mL/hr over 90 Minutes Intravenous Every 12 hours 04/24/13 1108 04/25/13 0928   04/21/13 1200  clindamycin (CLEOCIN) IVPB 600 mg  Status:  Discontinued     600 mg 100 mL/hr over 30 Minutes Intravenous Every 8 hours  04/21/13 1054 04/25/13 1028   04/21/13 1000  vancomycin (VANCOCIN) 1,250 mg in sodium chloride 0.9 % 250 mL IVPB  Status:  Discontinued     1,250 mg 166.7 mL/hr over 90 Minutes Intravenous Every 24 hours 04/20/13 1809 04/24/13 1108   04/20/13 1830  piperacillin-tazobactam (ZOSYN) IVPB 3.375 g  Status:  Discontinued     3.375 g 12.5 mL/hr over 240 Minutes Intravenous Every 8 hours 04/20/13 1737 04/26/13 1149   04/20/13 1045  piperacillin-tazobactam (ZOSYN) IVPB 3.375 g  Status:  Discontinued     3.375  g 12.5 mL/hr over 240 Minutes Intravenous  Once 04/20/13 1035 04/20/13 1036   04/20/13 1045  piperacillin-tazobactam (ZOSYN) IVPB 3.375 g     3.375 g 100 mL/hr over 30 Minutes Intravenous  Once 04/20/13 1036 04/20/13 1120   04/20/13 1000  vancomycin (VANCOCIN) IVPB 1000 mg/200 mL premix     1,000 mg 200 mL/hr over 60 Minutes Intravenous  Once 04/20/13 0953 04/20/13 1230   04/20/13 1000  piperacillin-tazobactam (ZOSYN) IVPB 3.375 g  Status:  Discontinued     3.375 g 12.5 mL/hr over 240 Minutes Intravenous  Once 04/20/13 0953 04/20/13 1035      Assessment/Plan: s/p Procedure(s): SHARP SURGICAL DEBRIDEMENT PERINEAL INFECTION INSERTION CENTRAL LINE LEFT SUBCLAVIAN Impression: Continues to heal well from surgical debridement of perineal Fournier's gangrene. Will switch to Unasyn as monotherapy. Patient has had Fleming PICC line placed. Awaiting placement to rehabilitation facility for continued extensive wound care management.   LOS: 6 days    Shawn Fleming 04/26/2013

## 2013-04-26 NOTE — Progress Notes (Signed)
RN was notified last night by PACU nurse that PICC was in right place and could be used.

## 2013-04-26 NOTE — Progress Notes (Signed)
Physical Therapy Wound Treatment Patient Details  Name: Shawn Fleming MRN: [id number] Date of Birth: [birth year]  Today's Date: 04/26/2013 Time: 1100-1200 Time Calculation (min): 60 min  Subjective  Subjective: Pt states that taking packing out and packing it again is the most painful part of tx.  Pain Score: Pain Score:   5  Wound Assessment  Incision 04/20/13 Perineum Bilateral (Active)  Site / Wound Assessment Granulation tissue;Red;Bleeding 04/26/2013 12:27 PM  Margins Unattacted edges (unapproximated) 04/26/2013 12:27 PM  Closure None 04/26/2013 12:27 PM  Drainage Amount Moderate 04/26/2013 12:27 PM  Drainage Description Serosanguineous 04/26/2013 12:27 PM  Treatment Cleansed 04/26/2013  4:30 AM  Dressing Type ABD 04/26/2013 12:27 PM  Dressing Changed 04/26/2013 12:27 PM     Incision 04/20/13 Chest Left (Active)  Site / Wound Assessment Clean;Dry 04/22/2013  8:05 AM  Drainage Amount None 04/22/2013  8:05 AM  Dressing Type Transparent dressing 04/22/2013  8:05 AM  Dressing Clean;Dry;Intact 04/22/2013  8:05 AM     Wound Assessment and Plan  Wound Therapy - Assess/Plan/Recommendations Wound Therapy - Clinical Statement: Wounds appear more shallow this session. Pt continues to be very sensitive to wound care. Pulsed lavage completed to all open areas. All areas were packed with saline soaked gausze and covered with ABD pads. Dressings were held in place with mesh briefs. Hydrotherapy Plan: Dressing change;Pulsatile lavage with suction Wound Therapy - Frequency: 6X / week Wound Plan: Continue as above using no tape on skin.  Wound Therapy Goals- Improve the function of patient's integumentary system by progressing the wound(s) through the phases of wound healing (inflammation - proliferation - remodeling) by:    Goals will be updated until maximal potential achieved or discharge criteria met.  Discharge criteria: when goals achieved, discharge from hospital, MD decision/surgical  intervention, no progress towards goals, refusal/missing three consecutive treatments without notification or medical reason.  Seth Bake, PTA  04/26/2013, 12:33 PM

## 2013-04-27 LAB — CBC
HCT: 28.2 % — ABNORMAL LOW (ref 39.0–52.0)
Hemoglobin: 9.8 g/dL — ABNORMAL LOW (ref 13.0–17.0)
MCH: 29.8 pg (ref 26.0–34.0)
MCHC: 34.8 g/dL (ref 30.0–36.0)
MCV: 85.7 fL (ref 78.0–100.0)
Platelets: 205 10*3/uL (ref 150–400)
RBC: 3.29 MIL/uL — ABNORMAL LOW (ref 4.22–5.81)
RDW: 15.1 % (ref 11.5–15.5)
WBC: 10.5 10*3/uL (ref 4.0–10.5)

## 2013-04-27 MED ORDER — HYDROCODONE-ACETAMINOPHEN 10-325 MG PO TABS: 1.0000 | ORAL_TABLET | ORAL | Status: DC | PRN

## 2013-04-27 NOTE — Progress Notes (Signed)
7 Days Post-Op  Subjective: Patient resting comfortably.  Objective: Vital signs in last 24 hours: Temp:  [98 F (36.7 C)-98.7 F (37.1 C)] 98 F (36.7 C) (05/11 0714) Pulse Rate:  [102-111] 104 (05/11 0714) Resp:  [20] 20 (05/11 0714) BP: (122-131)/(75-80) 122/77 mmHg (05/11 0714) SpO2:  [96 %-98 %] 96 % (05/11 0714) Last BM Date: 04/27/13  Intake/Output from previous day: 05/10 0701 - 05/11 0700 In: 1942.7 [P.O.:1080; I.V.:462.7; IV Piggyback:400] Out: 1600 [Urine:1600] Intake/Output this shift:    General appearance: no distress Resp: clear to auscultation bilaterally Cardio: regular rate and rhythm, S1, S2 normal, no murmur, click, rub or gallop  Lab Results:   Recent Labs  04/26/13 0643 04/27/13 0754  WBC 15.3* 10.5  HGB 10.0* 9.8*  HCT 28.8* 28.2*  PLT 194 205   BMET  Recent Labs  04/25/13 0440 04/26/13 0643  NA 143 139  K 3.6 3.7  CL 109 104  CO2 23 26  GLUCOSE 93 128*  BUN 9 10  CREATININE 1.30 1.37*  CALCIUM 8.6 8.7   PT/INR No results found for this basename: LABPROT, INR,  in the last 72 hours  Studies/Results: Dg Chest Port 1 View  04/25/2013  *RADIOLOGY REPORT*  Clinical Data: PICC line placement.  PORTABLE CHEST - 1 VIEW  Comparison: Film earlier this date  Findings: A right PICC line is identified with tip overlying the lower SVC. A left subclavian central venous catheter is present with tip overlying the brachiocephalic - SVC junction. The cardiomediastinal silhouette is unremarkable. There is no evidence of focal airspace disease, pulmonary edema, suspicious pulmonary nodule/mass, pleural effusion, or pneumothorax. No acute bony abnormalities are identified.  IMPRESSION: Right PICC line with tip overlying the lower SVC.  Left subclavian central venous catheter with tip overlying the brachiocephalic - SVC junction.  No evidence of acute cardiopulmonary disease.   Original Report Authenticated By: Harmon Pier, M.D.    Dg Chest Port 1  View  04/25/2013  *RADIOLOGY REPORT*  Clinical Data: PICC line placement  PORTABLE CHEST - 1 VIEW  Comparison: 04/20/2013  Findings: Right-sided PICC line tip over high right atrium, 6 cm below the carina.  Heart size is normal.  Lung volumes are low.  No pneumothorax.  No pleural effusion.  Previously seen left-sided subclavian approach central line tip terminates in the brachiocephalic/SVC junction.  Improved left upper lobe aeration.  IMPRESSION: Right-sided PICC line tip terminates over the high right atrium. This could be pulled back 1 cm if cavoatrial positioning is desired.   Original Report Authenticated By: Christiana Pellant, M.D.     Anti-infectives: Anti-infectives   Start     Dose/Rate Route Frequency Ordered Stop   04/26/13 1200  Ampicillin-Sulbactam (UNASYN) 3 g in sodium chloride 0.9 % 100 mL IVPB     3 g 100 mL/hr over 60 Minutes Intravenous Every 6 hours 04/26/13 1149     04/25/13 1200  clindamycin (CLEOCIN) IVPB 900 mg  Status:  Discontinued     900 mg 100 mL/hr over 30 Minutes Intravenous Every 8 hours 04/25/13 1028 04/26/13 1149   04/24/13 2200  vancomycin (VANCOCIN) 1,250 mg in sodium chloride 0.9 % 250 mL IVPB  Status:  Discontinued     1,250 mg 166.7 mL/hr over 90 Minutes Intravenous Every 12 hours 04/24/13 1108 04/25/13 0928   04/21/13 1200  clindamycin (CLEOCIN) IVPB 600 mg  Status:  Discontinued     600 mg 100 mL/hr over 30 Minutes Intravenous Every 8 hours 04/21/13 1054  04/25/13 1028   04/21/13 1000  vancomycin (VANCOCIN) 1,250 mg in sodium chloride 0.9 % 250 mL IVPB  Status:  Discontinued     1,250 mg 166.7 mL/hr over 90 Minutes Intravenous Every 24 hours 04/20/13 1809 04/24/13 1108   04/20/13 1830  piperacillin-tazobactam (ZOSYN) IVPB 3.375 g  Status:  Discontinued     3.375 g 12.5 mL/hr over 240 Minutes Intravenous Every 8 hours 04/20/13 1737 04/26/13 1149   04/20/13 1045  piperacillin-tazobactam (ZOSYN) IVPB 3.375 g  Status:  Discontinued     3.375 g 12.5 mL/hr  over 240 Minutes Intravenous  Once 04/20/13 1035 04/20/13 1036   04/20/13 1045  piperacillin-tazobactam (ZOSYN) IVPB 3.375 g     3.375 g 100 mL/hr over 30 Minutes Intravenous  Once 04/20/13 1036 04/20/13 1120   04/20/13 1000  vancomycin (VANCOCIN) IVPB 1000 mg/200 mL premix     1,000 mg 200 mL/hr over 60 Minutes Intravenous  Once 04/20/13 0953 04/20/13 1230   04/20/13 1000  piperacillin-tazobactam (ZOSYN) IVPB 3.375 g  Status:  Discontinued     3.375 g 12.5 mL/hr over 240 Minutes Intravenous  Once 04/20/13 9811 04/20/13 1035      Assessment/Plan: s/p Procedure(s): SHARP SURGICAL DEBRIDEMENT PERINEAL INFECTION INSERTION CENTRAL LINE LEFT SUBCLAVIAN Impression: Tolerating pulse lavage fairly well. White blood cell count has normalized. He is now on Unasyn monotherapy. Awaiting placement.  LOS: 7 days    Manraj Yeo A 04/27/2013

## 2013-04-27 NOTE — Progress Notes (Signed)
Patients packing in wounds removed and replaced with new saline moist gauze.  Minimal amount of serosanguinous drainage from wounds noted during dressing change.  ABDs applied

## 2013-04-28 NOTE — Progress Notes (Addendum)
Nutrition Brief Note  Patient identified due to length of stay. Chart reviewed.  Body mass index is 37.88 kg/(m^2). Patient meets criteria for Obesity Class II based on current BMI.   Current diet order is Regular, patient is consuming approximately 75% of meals at this time. Labs and medications reviewed.   No nutrition interventions warranted at this time. If nutrition issues arise, please consult RD.   Royann Shivers MS,RD,LDN,CSG Office: [phone] Pager: [phone]

## 2013-04-28 NOTE — Progress Notes (Signed)
8 Days Post-Op  Subjective: Overall feels okay. Tolerating pulse lavage the perineal wounds.  Objective: Vital signs in last 24 hours: Temp:  [97.9 F (36.6 C)-98.4 F (36.9 C)] 98 F (36.7 C) (05/12 0713) Pulse Rate:  [91-118] 91 (05/12 0713) Resp:  [20] 20 (05/12 0713) BP: (114-132)/(80-88) 132/83 mmHg (05/12 0713) SpO2:  [97 %-98 %] 98 % (05/12 0713) Last BM Date: 04/27/13  Intake/Output from previous day: 05/11 0701 - 05/12 0700 In: 780 [P.O.:480; IV Piggyback:300] Out: 300 [Urine:300] Intake/Output this shift:    General appearance: alert and no distress Skin: Perineal dressings intact. No odor  Lab Results:   Recent Labs  04/26/13 0643 04/27/13 0754  WBC 15.3* 10.5  HGB 10.0* 9.8*  HCT 28.8* 28.2*  PLT 194 205   BMET  Recent Labs  04/26/13 0643  NA 139  K 3.7  CL 104  CO2 26  GLUCOSE 128*  BUN 10  CREATININE 1.37*  CALCIUM 8.7   PT/INR No results found for this basename: LABPROT, INR,  in the last 72 hours ABG No results found for this basename: PHART, PCO2, PO2, HCO3,  in the last 72 hours  Studies/Results: No results found.  Anti-infectives: Anti-infectives   Start     Dose/Rate Route Frequency Ordered Stop   04/26/13 1200  Ampicillin-Sulbactam (UNASYN) 3 g in sodium chloride 0.9 % 100 mL IVPB     3 g 100 mL/hr over 60 Minutes Intravenous Every 6 hours 04/26/13 1149     04/25/13 1200  clindamycin (CLEOCIN) IVPB 900 mg  Status:  Discontinued     900 mg 100 mL/hr over 30 Minutes Intravenous Every 8 hours 04/25/13 1028 04/26/13 1149   04/24/13 2200  vancomycin (VANCOCIN) 1,250 mg in sodium chloride 0.9 % 250 mL IVPB  Status:  Discontinued     1,250 mg 166.7 mL/hr over 90 Minutes Intravenous Every 12 hours 04/24/13 1108 04/25/13 0928   04/21/13 1200  clindamycin (CLEOCIN) IVPB 600 mg  Status:  Discontinued     600 mg 100 mL/hr over 30 Minutes Intravenous Every 8 hours 04/21/13 1054 04/25/13 1028   04/21/13 1000  vancomycin (VANCOCIN) 1,250  mg in sodium chloride 0.9 % 250 mL IVPB  Status:  Discontinued     1,250 mg 166.7 mL/hr over 90 Minutes Intravenous Every 24 hours 04/20/13 1809 04/24/13 1108   04/20/13 1830  piperacillin-tazobactam (ZOSYN) IVPB 3.375 g  Status:  Discontinued     3.375 g 12.5 mL/hr over 240 Minutes Intravenous Every 8 hours 04/20/13 1737 04/26/13 1149   04/20/13 1045  piperacillin-tazobactam (ZOSYN) IVPB 3.375 g  Status:  Discontinued     3.375 g 12.5 mL/hr over 240 Minutes Intravenous  Once 04/20/13 1035 04/20/13 1036   04/20/13 1045  piperacillin-tazobactam (ZOSYN) IVPB 3.375 g     3.375 g 100 mL/hr over 30 Minutes Intravenous  Once 04/20/13 1036 04/20/13 1120   04/20/13 1000  vancomycin (VANCOCIN) IVPB 1000 mg/200 mL premix     1,000 mg 200 mL/hr over 60 Minutes Intravenous  Once 04/20/13 0953 04/20/13 1230   04/20/13 1000  piperacillin-tazobactam (ZOSYN) IVPB 3.375 g  Status:  Discontinued     3.375 g 12.5 mL/hr over 240 Minutes Intravenous  Once 04/20/13 0953 04/20/13 1035      Assessment/Plan: s/p Procedure(s): SHARP SURGICAL DEBRIDEMENT PERINEAL INFECTION (N/A) INSERTION CENTRAL LINE LEFT SUBCLAVIAN (Left) Fournier's gangrene. Overall his extremely well over the last week. He continues to tolerate wound care and plans are now 4 transfer  [facility]. Awaiting transfer.  LOS: 8 days    Jessee Newnam C 04/28/2013

## 2013-04-28 NOTE — Progress Notes (Signed)
Physical Therapy Wound Treatment Patient Details  Name: Shawn Fleming MRN: [id number] Date of Birth: 12-29-1955  Today's Date: 04/28/2013 Time:  -     Subjective  Subjective: I've been walking in the hallway a lot...scrotum feels much less swollen  Pain Score: Pain Score:  ("improved")  Wound Assessment  Incision 04/20/13 Perineum Bilateral (Active)  Site / Wound Assessment Granulation tissue;Red 04/28/2013  1:19 PM  Margins Unattacted edges (unapproximated) 04/28/2013  1:19 PM  Closure None 04/28/2013  1:19 PM  Drainage Amount Minimal 04/28/2013  1:19 PM  Drainage Description Serosanguineous 04/28/2013  1:19 PM  Treatment Cleansed 04/26/2013  4:30 AM  Dressing Type ABD;Moist to moist 04/28/2013  1:19 PM  Dressing Changed 04/28/2013  1:19 PM       Wound Assessment and Plan  Wound Therapy - Assess/Plan/Recommendations Wound Therapy - Clinical Statement: All wounds were free of eschar, bloody.  No odor or pus evident.  Pt feels as if his edema is significantly decreased and is feeling much better.  The wounds were cleansed with PL and most of them are now  interconnected.  Saline soaked conform gauze was used to gently pack the wounds.  ABD pads are used and held in place by surgical bridfs as pt does not tolerate tape.                       Hydrotherapy Plan: Dressing change;Pulsatile lavage with suction  Wound Therapy Goals- Improve the function of patient's integumentary system by progressing the wound(s) through the phases of wound healing (inflammation - proliferation - remodeling) by:    Goals will be updated until maximal potential achieved or discharge criteria met.  Discharge criteria: when goals achieved, discharge from hospital, MD decision/surgical intervention, no progress towards goals, refusal/missing three consecutive treatments without notification or medical reason.  GP     Myrlene Broker L 04/28/2013, 1:25 PM

## 2013-04-28 NOTE — Discharge Summary (Addendum)
Physician Discharge Summary  Patient ID: Shawn Fleming MRN: [id number] DOB/AGE: 01/21/1956 57 y.o.  Admit date: 04/20/2013 Discharge date: 04/28/2013  Admission Diagnoses: Fournier's gangrene, renal insufficiency  Discharge Diagnoses: Same Active Problems:   * No active hospital problems. *   Discharged Condition: good  Hospital Course: Patient is a 57 year old white male who presented emergency room with worsening fever, chills, and perianal pain. CT scan of the pelvis revealed Fournier's gangrene of the perineum. Surgery was consulted the patient was taken emergently to the operating room for incision and drainage of the perineal gangrene. Multiple incisions were made. The patient was noted be in renal failure, nonoliguric. He also had a significant leukocytosis. He was transferred to the intensive care unit for further postoperative treatment after surgery. While in the ICU, he recovered remarkably well. Final pathology is revealed a streptococcal species. He was subsequently switched to Unasyn for monotherapy. His leukocytosis has resolved. His renal insufficiency has resolved. His mentation has greatly improved. He has been treated with pulse lavage therapy to all the wounds. He is being discharged to a rehabilitation facility for further wound care management as this could not be done at home.  Treatments: surgery: Extensive incision and drainage of perineal gangrene on 04/20/2013  Discharge Exam: Blood pressure 132/83, pulse 91, temperature 98 F (36.7 C), temperature source Oral, resp. rate 20, height 5\' 6"  (1.676 m), weight 106.4 kg (234 lb 9.1 oz), SpO2 98.00%. General appearance: alert, cooperative and no distress Resp: clear to auscultation bilaterally Cardio: regular rate and rhythm, S1, S2 normal, no murmur, click, rub or gallop Incision/Wound: perineal wounds healing well by secondary intention with minimal purulent drainage.  Disposition: [facility]    Medication  List    ASK your doctor about these medications       alfuzosin 10 MG 24 hr tablet  Commonly known as:  UROXATRAL  Take 10 mg by mouth daily.     escitalopram 10 MG tablet  Commonly known as:  LEXAPRO  Take 10 mg by mouth daily.     esomeprazole 40 MG capsule  Commonly known as:  NEXIUM  Take 40 mg by mouth daily.     levofloxacin 750 MG tablet  Commonly known as:  LEVAQUIN  Take 750 mg by mouth daily.     naproxen 500 MG tablet  Commonly known as:  NAPROSYN  Take 500 mg by mouth 2 (two) times daily.     Oxycodone HCl 10 MG Tabs  Take 10 mg by mouth 3 (three) times daily.     pregabalin 75 MG capsule  Commonly known as:  LYRICA  Take 75 mg by mouth 2 (two) times daily.     rosuvastatin 10 MG tablet  Commonly known as:  CRESTOR  Take 10 mg by mouth daily.     topiramate 200 MG tablet  Commonly known as:  TOPAMAX  Take 200 mg by mouth at bedtime.     verapamil 240 MG CR tablet  Commonly known as:  CALAN-SR  Take 240 mg by mouth at bedtime.         SignedFranky Macho A 04/28/2013, 1:41 PM  No acute change.  Patient has remained stable with continued local wound care and IV antibiotics over the last 24hours.  Patient will be transferred to [facility].     Addendum: As addended to the operative notes in reference to the CDI query: Patient had sharp excisional surgical debridement of the perirectal, perineal, and scrotal soft tissue.  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

## 2013-04-29 ENCOUNTER — Inpatient Hospital Stay
Admission: AD | Admit: 2013-04-29 | Discharge: 2013-05-22 | Disposition: A | Discharge status: Home Health | Payer: Self-pay | Source: Ambulatory Visit | Attending: Internal Medicine | Admitting: Internal Medicine

## 2013-04-29 MED ORDER — SODIUM CHLORIDE 0.9 % IV SOLN: 3.0000 g | Freq: Four times a day (QID) | INTRAVENOUS | Status: DC

## 2013-04-29 MED ORDER — HYDROCODONE-ACETAMINOPHEN 10-325 MG PO TABS: 1.0000 | ORAL_TABLET | ORAL | Status: DC | PRN

## 2013-04-29 NOTE — Progress Notes (Signed)
Report called to [facility] and given to [facility].

## 2013-04-29 NOTE — Progress Notes (Signed)
9 Days Post-Op  Subjective: NO acute issues.  Comfortable.   Objective: Vital signs in last 24 hours: Temp:  [98.2 F (36.8 C)] 98.2 F (36.8 C) (05/13 0703) Pulse Rate:  [83-85] 85 (05/13 0703) Resp:  [20] 20 (05/13 0703) BP: (101-120)/(68-79) 101/68 mmHg (05/13 0703) SpO2:  [93 %-96 %] 93 % (05/13 0703) Last BM Date: 04/28/13  Intake/Output from previous day: 05/12 0701 - 05/13 0700 In: 328.3 [I.V.:228.3; IV Piggyback:100] Out: 200 [Urine:200] Intake/Output this shift: Total I/O In: 240 [P.O.:240] Out: 1200 [Urine:1200]  General appearance: alert and no distress Skin: Perineal wound dressed.    Lab Results:   Recent Labs  04/27/13 0754  WBC 10.5  HGB 9.8*  HCT 28.2*  PLT 205   BMET No results found for this basename: NA, K, CL, CO2, GLUCOSE, BUN, CREATININE, CALCIUM,  in the last 72 hours PT/INR No results found for this basename: LABPROT, INR,  in the last 72 hours ABG No results found for this basename: PHART, PCO2, PO2, HCO3,  in the last 72 hours  Studies/Results: No results found.  Anti-infectives: Anti-infectives   Start     Dose/Rate Route Frequency Ordered Stop   04/26/13 1200  Ampicillin-Sulbactam (UNASYN) 3 g in sodium chloride 0.9 % 100 mL IVPB     3 g 100 mL/hr over 60 Minutes Intravenous Every 6 hours 04/26/13 1149     04/25/13 1200  clindamycin (CLEOCIN) IVPB 900 mg  Status:  Discontinued     900 mg 100 mL/hr over 30 Minutes Intravenous Every 8 hours 04/25/13 1028 04/26/13 1149   04/24/13 2200  vancomycin (VANCOCIN) 1,250 mg in sodium chloride 0.9 % 250 mL IVPB  Status:  Discontinued     1,250 mg 166.7 mL/hr over 90 Minutes Intravenous Every 12 hours 04/24/13 1108 04/25/13 0928   04/21/13 1200  clindamycin (CLEOCIN) IVPB 600 mg  Status:  Discontinued     600 mg 100 mL/hr over 30 Minutes Intravenous Every 8 hours 04/21/13 1054 04/25/13 1028   04/21/13 1000  vancomycin (VANCOCIN) 1,250 mg in sodium chloride 0.9 % 250 mL IVPB  Status:   Discontinued     1,250 mg 166.7 mL/hr over 90 Minutes Intravenous Every 24 hours 04/20/13 1809 04/24/13 1108   04/20/13 1830  piperacillin-tazobactam (ZOSYN) IVPB 3.375 g  Status:  Discontinued     3.375 g 12.5 mL/hr over 240 Minutes Intravenous Every 8 hours 04/20/13 1737 04/26/13 1149   04/20/13 1045  piperacillin-tazobactam (ZOSYN) IVPB 3.375 g  Status:  Discontinued     3.375 g 12.5 mL/hr over 240 Minutes Intravenous  Once 04/20/13 1035 04/20/13 1036   04/20/13 1045  piperacillin-tazobactam (ZOSYN) IVPB 3.375 g     3.375 g 100 mL/hr over 30 Minutes Intravenous  Once 04/20/13 1036 04/20/13 1120   04/20/13 1000  vancomycin (VANCOCIN) IVPB 1000 mg/200 mL premix     1,000 mg 200 mL/hr over 60 Minutes Intravenous  Once 04/20/13 0953 04/20/13 1230   04/20/13 1000  piperacillin-tazobactam (ZOSYN) IVPB 3.375 g  Status:  Discontinued     3.375 g 12.5 mL/hr over 240 Minutes Intravenous  Once 04/20/13 0953 04/20/13 1035      Assessment/Plan: s/p Procedure(s): SHARP SURGICAL DEBRIDEMENT PERINEAL INFECTION (N/A) INSERTION CENTRAL LINE LEFT SUBCLAVIAN (Left) Continue local wound care.  Awaiting transfer to [facility].    LOS: 9 days    Zen Felling C 04/29/2013

## 2013-04-29 NOTE — Progress Notes (Signed)
Physical Therapy Wound Treatment Patient Details  Name: Shawn Fleming MRN: [id number] Date of Birth: Sep 04, 1956  Today's Date: 04/29/2013 Time:  -     Subjective  Subjective: continues to feel better  Pain Score: Pain Score:   9 (premed before dressing change)  Wound Assessment  Incision 04/20/13 Perineum Bilateral (Active)  Site / Wound Assessment Granulation tissue;Red 04/29/2013 11:20 AM  Margins Unattacted edges (unapproximated) 04/29/2013 11:20 AM  Closure None 04/29/2013 11:20 AM  Drainage Amount Moderate 04/29/2013 11:20 AM  Drainage Description Serosanguineous 04/29/2013 11:20 AM  Treatment Cleansed 04/26/2013  4:30 AM  Dressing Type ABD;Moist to moist 04/29/2013 11:20 AM  Dressing Changed 04/29/2013 11:20 AM       Wound Assessment and Plan  Wound Therapy - Assess/Plan/Recommendations Wound Therapy - Clinical Statement: Wounds continue to heal well  It is very difficult to measure all 9 wounds ans most are interconnected..In general, they are getting more shallow, there is excellent vascular supply and pt is tolerating PL with less pain.  The skin around the wounds has only minimal erythema. Hydrotherapy Plan: Dressing change;Pulsatile lavage with suction Wound Therapy - Frequency: 6X / week  Wound Therapy Goals- Improve the function of patient's integumentary system by progressing the wound(s) through the phases of wound healing (inflammation - proliferation - remodeling) by: Decrease Length/Width/Depth - Progress: Goal set today  Goals will be updated until maximal potential achieved or discharge criteria met.  Discharge criteria: when goals achieved, discharge from hospital, MD decision/surgical intervention, no progress towards goals, refusal/missing three consecutive treatments without notification or medical reason.  GP     Myrlene Broker L 04/29/2013, 11:24 AM

## 2013-04-30 ENCOUNTER — Other Ambulatory Visit (HOSPITAL_COMMUNITY): Payer: Self-pay

## 2013-04-30 LAB — CBC WITH DIFFERENTIAL/PLATELET
Basophils Absolute: 0 10*3/uL (ref 0.0–0.1)
Basophils Relative: 0 % (ref 0–1)
Eosinophils Absolute: 0.5 10*3/uL (ref 0.0–0.7)
Eosinophils Relative: 6 % — ABNORMAL HIGH (ref 0–5)
HCT: 28.6 % — ABNORMAL LOW (ref 39.0–52.0)
Hemoglobin: 9.4 g/dL — ABNORMAL LOW (ref 13.0–17.0)
Lymphocytes Relative: 19 % (ref 12–46)
Lymphs Abs: 1.6 10*3/uL (ref 0.7–4.0)
MCH: 28.7 pg (ref 26.0–34.0)
MCHC: 32.9 g/dL (ref 30.0–36.0)
MCV: 87.2 fL (ref 78.0–100.0)
Monocytes Absolute: 0.8 10*3/uL (ref 0.1–1.0)
Monocytes Relative: 10 % (ref 3–12)
Neutro Abs: 5.3 10*3/uL (ref 1.7–7.7)
Neutrophils Relative %: 65 % (ref 43–77)
Platelets: 358 10*3/uL (ref 150–400)
RBC: 3.28 MIL/uL — ABNORMAL LOW (ref 4.22–5.81)
RDW: 15.3 % (ref 11.5–15.5)
WBC: 8.1 10*3/uL (ref 4.0–10.5)

## 2013-04-30 LAB — COMPREHENSIVE METABOLIC PANEL
ALT: 10 U/L (ref 0–53)
AST: 13 U/L (ref 0–37)
Albumin: 2.6 g/dL — ABNORMAL LOW (ref 3.5–5.2)
Alkaline Phosphatase: 75 U/L (ref 39–117)
BUN: 10 mg/dL (ref 6–23)
CO2: 28 mEq/L (ref 19–32)
Calcium: 8.4 mg/dL (ref 8.4–10.5)
Chloride: 105 mEq/L (ref 96–112)
Creatinine, Ser: 1.36 mg/dL — ABNORMAL HIGH (ref 0.50–1.35)
GFR calc Af Amer: 65 mL/min — ABNORMAL LOW (ref >90)
GFR calc non Af Amer: 56 mL/min — ABNORMAL LOW (ref >90)
Glucose, Bld: 102 mg/dL — ABNORMAL HIGH (ref 70–99)
Potassium: 3.7 mEq/L (ref 3.5–5.1)
Sodium: 140 mEq/L (ref 135–145)
Total Bilirubin: 0.2 mg/dL — ABNORMAL LOW (ref 0.3–1.2)
Total Protein: 6.6 g/dL (ref 6.0–8.3)

## 2013-04-30 LAB — TSH: TSH: 2.456 u[IU]/mL (ref 0.350–4.500)

## 2013-05-01 LAB — CBC
HCT: 27.3 % — ABNORMAL LOW (ref 39.0–52.0)
Hemoglobin: 9 g/dL — ABNORMAL LOW (ref 13.0–17.0)
MCH: 29 pg (ref 26.0–34.0)
MCHC: 33 g/dL (ref 30.0–36.0)
MCV: 88.1 fL (ref 78.0–100.0)
Platelets: 354 10*3/uL (ref 150–400)
RBC: 3.1 MIL/uL — ABNORMAL LOW (ref 4.22–5.81)
RDW: 15.4 % (ref 11.5–15.5)
WBC: 7.2 10*3/uL (ref 4.0–10.5)

## 2013-05-01 LAB — BASIC METABOLIC PANEL
BUN: 12 mg/dL (ref 6–23)
CO2: 27 mEq/L (ref 19–32)
Calcium: 8.6 mg/dL (ref 8.4–10.5)
Chloride: 110 mEq/L (ref 96–112)
Creatinine, Ser: 1.47 mg/dL — ABNORMAL HIGH (ref 0.50–1.35)
GFR calc Af Amer: 59 mL/min — ABNORMAL LOW (ref >90)
GFR calc non Af Amer: 51 mL/min — ABNORMAL LOW (ref >90)
Glucose, Bld: 92 mg/dL (ref 70–99)
Potassium: 3.9 mEq/L (ref 3.5–5.1)
Sodium: 142 mEq/L (ref 135–145)

## 2013-05-01 LAB — T4: T4, Total: 8.3 ug/dL (ref 5.0–12.5)

## 2013-05-01 LAB — PREALBUMIN: Prealbumin: 11.4 mg/dL — ABNORMAL LOW (ref 17.0–34.0)

## 2013-05-01 LAB — T3: T3, Total: 88.9 ng/dl (ref 80.0–204.0)

## 2013-05-01 LAB — PROCALCITONIN: Procalcitonin: <0.1 ng/mL

## 2013-05-02 LAB — BASIC METABOLIC PANEL
BUN: 13 mg/dL (ref 6–23)
CO2: 26 mEq/L (ref 19–32)
Calcium: 8.6 mg/dL (ref 8.4–10.5)
Chloride: 108 mEq/L (ref 96–112)
Creatinine, Ser: 1.46 mg/dL — ABNORMAL HIGH (ref 0.50–1.35)
GFR calc Af Amer: 60 mL/min — ABNORMAL LOW (ref >90)
GFR calc non Af Amer: 52 mL/min — ABNORMAL LOW (ref >90)
Glucose, Bld: 102 mg/dL — ABNORMAL HIGH (ref 70–99)
Potassium: 3.8 mEq/L (ref 3.5–5.1)
Sodium: 141 mEq/L (ref 135–145)

## 2013-05-02 LAB — CBC
HCT: 27.3 % — ABNORMAL LOW (ref 39.0–52.0)
Hemoglobin: 9 g/dL — ABNORMAL LOW (ref 13.0–17.0)
MCH: 29.2 pg (ref 26.0–34.0)
MCHC: 33 g/dL (ref 30.0–36.0)
MCV: 88.6 fL (ref 78.0–100.0)
Platelets: 389 10*3/uL (ref 150–400)
RBC: 3.08 MIL/uL — ABNORMAL LOW (ref 4.22–5.81)
RDW: 15.4 % (ref 11.5–15.5)
WBC: 7.3 10*3/uL (ref 4.0–10.5)

## 2013-05-03 LAB — BASIC METABOLIC PANEL
BUN: 16 mg/dL (ref 6–23)
CO2: 24 mEq/L (ref 19–32)
Calcium: 8.5 mg/dL (ref 8.4–10.5)
Chloride: 106 mEq/L (ref 96–112)
Creatinine, Ser: 1.51 mg/dL — ABNORMAL HIGH (ref 0.50–1.35)
GFR calc Af Amer: 57 mL/min — ABNORMAL LOW (ref >90)
GFR calc non Af Amer: 50 mL/min — ABNORMAL LOW (ref >90)
Glucose, Bld: 107 mg/dL — ABNORMAL HIGH (ref 70–99)
Potassium: 4 mEq/L (ref 3.5–5.1)
Sodium: 141 mEq/L (ref 135–145)

## 2013-05-04 ENCOUNTER — Other Ambulatory Visit (HOSPITAL_COMMUNITY): Payer: Self-pay

## 2013-05-04 LAB — BASIC METABOLIC PANEL
BUN: 18 mg/dL (ref 6–23)
CO2: 27 mEq/L (ref 19–32)
Calcium: 8.6 mg/dL (ref 8.4–10.5)
Chloride: 108 mEq/L (ref 96–112)
Creatinine, Ser: 1.54 mg/dL — ABNORMAL HIGH (ref 0.50–1.35)
GFR calc Af Amer: 56 mL/min — ABNORMAL LOW (ref >90)
GFR calc non Af Amer: 48 mL/min — ABNORMAL LOW (ref >90)
Glucose, Bld: 101 mg/dL — ABNORMAL HIGH (ref 70–99)
Potassium: 3.8 mEq/L (ref 3.5–5.1)
Sodium: 139 mEq/L (ref 135–145)

## 2013-05-05 LAB — CBC
HCT: 26.4 % — ABNORMAL LOW (ref 39.0–52.0)
Hemoglobin: 8.9 g/dL — ABNORMAL LOW (ref 13.0–17.0)
MCH: 29.7 pg (ref 26.0–34.0)
MCHC: 33.7 g/dL (ref 30.0–36.0)
MCV: 88 fL (ref 78.0–100.0)
Platelets: 406 10*3/uL — ABNORMAL HIGH (ref 150–400)
RBC: 3 MIL/uL — ABNORMAL LOW (ref 4.22–5.81)
RDW: 15.2 % (ref 11.5–15.5)
WBC: 6.2 10*3/uL (ref 4.0–10.5)

## 2013-05-05 LAB — BASIC METABOLIC PANEL
BUN: 17 mg/dL (ref 6–23)
CO2: 26 mEq/L (ref 19–32)
Calcium: 8.6 mg/dL (ref 8.4–10.5)
Chloride: 108 mEq/L (ref 96–112)
Creatinine, Ser: 1.43 mg/dL — ABNORMAL HIGH (ref 0.50–1.35)
GFR calc Af Amer: 61 mL/min — ABNORMAL LOW (ref >90)
GFR calc non Af Amer: 53 mL/min — ABNORMAL LOW (ref >90)
Glucose, Bld: 102 mg/dL — ABNORMAL HIGH (ref 70–99)
Potassium: 4.4 mEq/L (ref 3.5–5.1)
Sodium: 139 mEq/L (ref 135–145)

## 2013-05-05 LAB — MAGNESIUM: Magnesium: 2.3 mg/dL (ref 1.5–2.5)

## 2013-05-06 DIAGNOSIS — M79609 Pain in unspecified limb: Secondary | ICD-10-CM

## 2013-05-06 LAB — BASIC METABOLIC PANEL
BUN: 17 mg/dL (ref 6–23)
CO2: 24 mEq/L (ref 19–32)
Calcium: 8.6 mg/dL (ref 8.4–10.5)
Chloride: 110 mEq/L (ref 96–112)
Creatinine, Ser: 1.29 mg/dL (ref 0.50–1.35)
GFR calc Af Amer: 70 mL/min — ABNORMAL LOW (ref >90)
GFR calc non Af Amer: 60 mL/min — ABNORMAL LOW (ref >90)
Glucose, Bld: 80 mg/dL (ref 70–99)
Potassium: 3.9 mEq/L (ref 3.5–5.1)
Sodium: 142 mEq/L (ref 135–145)

## 2013-05-06 NOTE — Progress Notes (Signed)
Bilateral lower extremity venous duplex completed.  Right:  DVT noted in the profunda and posterior tibial veins.  No evidence of superficial thrombosis.  No Baker's cyst.  Left:  No evidence of DVT, superficial thrombosis, or Baker's cyst.

## 2013-05-07 LAB — BASIC METABOLIC PANEL
BUN: 19 mg/dL (ref 6–23)
CO2: 25 mEq/L (ref 19–32)
Calcium: 8.4 mg/dL (ref 8.4–10.5)
Chloride: 107 mEq/L (ref 96–112)
Creatinine, Ser: 1.31 mg/dL (ref 0.50–1.35)
GFR calc Af Amer: 68 mL/min — ABNORMAL LOW (ref >90)
GFR calc non Af Amer: 59 mL/min — ABNORMAL LOW (ref >90)
Glucose, Bld: 102 mg/dL — ABNORMAL HIGH (ref 70–99)
Potassium: 3.6 mEq/L (ref 3.5–5.1)
Sodium: 140 mEq/L (ref 135–145)

## 2013-05-07 LAB — CBC
HCT: 27.9 % — ABNORMAL LOW (ref 39.0–52.0)
Hemoglobin: 9.2 g/dL — ABNORMAL LOW (ref 13.0–17.0)
MCH: 28.9 pg (ref 26.0–34.0)
MCHC: 33 g/dL (ref 30.0–36.0)
MCV: 87.7 fL (ref 78.0–100.0)
Platelets: 335 10*3/uL (ref 150–400)
RBC: 3.18 MIL/uL — ABNORMAL LOW (ref 4.22–5.81)
RDW: 15 % (ref 11.5–15.5)
WBC: 5.5 10*3/uL (ref 4.0–10.5)

## 2013-05-07 NOTE — Clinical Documentation Improvement (Signed)
Please see the addendum to both the operative report as well as to the discharge summary. 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

## 2013-05-08 LAB — BASIC METABOLIC PANEL
BUN: 19 mg/dL (ref 6–23)
CO2: 23 mEq/L (ref 19–32)
Calcium: 8.8 mg/dL (ref 8.4–10.5)
Chloride: 107 mEq/L (ref 96–112)
Creatinine, Ser: 1.34 mg/dL (ref 0.50–1.35)
GFR calc Af Amer: 66 mL/min — ABNORMAL LOW (ref >90)
GFR calc non Af Amer: 57 mL/min — ABNORMAL LOW (ref >90)
Glucose, Bld: 87 mg/dL (ref 70–99)
Potassium: 3.9 mEq/L (ref 3.5–5.1)
Sodium: 141 mEq/L (ref 135–145)

## 2013-05-08 LAB — PROTIME-INR
INR: 1.1 (ref 0.00–1.49)
Prothrombin Time: 14.1 seconds (ref 11.6–15.2)

## 2013-05-09 LAB — CBC
HCT: 28.1 % — ABNORMAL LOW (ref 39.0–52.0)
Hemoglobin: 9.2 g/dL — ABNORMAL LOW (ref 13.0–17.0)
MCH: 28.7 pg (ref 26.0–34.0)
MCHC: 32.7 g/dL (ref 30.0–36.0)
MCV: 87.5 fL (ref 78.0–100.0)
Platelets: 287 10*3/uL (ref 150–400)
RBC: 3.21 MIL/uL — ABNORMAL LOW (ref 4.22–5.81)
RDW: 14.9 % (ref 11.5–15.5)
WBC: 5.4 10*3/uL (ref 4.0–10.5)

## 2013-05-09 LAB — BASIC METABOLIC PANEL
BUN: 19 mg/dL (ref 6–23)
CO2: 25 mEq/L (ref 19–32)
Calcium: 8.8 mg/dL (ref 8.4–10.5)
Chloride: 108 mEq/L (ref 96–112)
Creatinine, Ser: 1.34 mg/dL (ref 0.50–1.35)
GFR calc Af Amer: 66 mL/min — ABNORMAL LOW (ref >90)
GFR calc non Af Amer: 57 mL/min — ABNORMAL LOW (ref >90)
Glucose, Bld: 91 mg/dL (ref 70–99)
Potassium: 4.3 mEq/L (ref 3.5–5.1)
Sodium: 141 mEq/L (ref 135–145)

## 2013-05-09 LAB — PROTIME-INR
INR: 1.05 (ref 0.00–1.49)
Prothrombin Time: 13.6 seconds (ref 11.6–15.2)

## 2013-05-10 LAB — PROTIME-INR
INR: 1.17 (ref 0.00–1.49)
Prothrombin Time: 14.7 seconds (ref 11.6–15.2)

## 2013-05-11 LAB — PROTIME-INR
INR: 1.33 (ref 0.00–1.49)
Prothrombin Time: 16.2 seconds — ABNORMAL HIGH (ref 11.6–15.2)

## 2013-05-12 LAB — BASIC METABOLIC PANEL
BUN: 23 mg/dL (ref 6–23)
CO2: 25 mEq/L (ref 19–32)
Calcium: 9 mg/dL (ref 8.4–10.5)
Chloride: 105 mEq/L (ref 96–112)
Creatinine, Ser: 1.35 mg/dL (ref 0.50–1.35)
GFR calc Af Amer: 66 mL/min — ABNORMAL LOW (ref >90)
GFR calc non Af Amer: 57 mL/min — ABNORMAL LOW (ref >90)
Glucose, Bld: 86 mg/dL (ref 70–99)
Potassium: 3.6 mEq/L (ref 3.5–5.1)
Sodium: 140 mEq/L (ref 135–145)

## 2013-05-12 LAB — CBC
HCT: 34.2 % — ABNORMAL LOW (ref 39.0–52.0)
Hemoglobin: 10.8 g/dL — ABNORMAL LOW (ref 13.0–17.0)
MCH: 28 pg (ref 26.0–34.0)
MCHC: 31.6 g/dL (ref 30.0–36.0)
MCV: 88.6 fL (ref 78.0–100.0)
Platelets: 299 10*3/uL (ref 150–400)
RBC: 3.86 MIL/uL — ABNORMAL LOW (ref 4.22–5.81)
RDW: 15.2 % (ref 11.5–15.5)
WBC: 6.2 10*3/uL (ref 4.0–10.5)

## 2013-05-12 LAB — PROTIME-INR
INR: 1.32 (ref 0.00–1.49)
Prothrombin Time: 16.1 seconds — ABNORMAL HIGH (ref 11.6–15.2)

## 2013-05-13 LAB — PROTIME-INR
INR: 2.15 — ABNORMAL HIGH (ref 0.00–1.49)
Prothrombin Time: 23.1 seconds — ABNORMAL HIGH (ref 11.6–15.2)

## 2013-05-14 LAB — CBC
HCT: 34.2 % — ABNORMAL LOW (ref 39.0–52.0)
Hemoglobin: 11 g/dL — ABNORMAL LOW (ref 13.0–17.0)
MCH: 28.2 pg (ref 26.0–34.0)
MCHC: 32.2 g/dL (ref 30.0–36.0)
MCV: 87.7 fL (ref 78.0–100.0)
Platelets: 286 10*3/uL (ref 150–400)
RBC: 3.9 MIL/uL — ABNORMAL LOW (ref 4.22–5.81)
RDW: 15 % (ref 11.5–15.5)
WBC: 7.6 10*3/uL (ref 4.0–10.5)

## 2013-05-14 LAB — BASIC METABOLIC PANEL
BUN: 24 mg/dL — ABNORMAL HIGH (ref 6–23)
CO2: 25 mEq/L (ref 19–32)
Calcium: 9.1 mg/dL (ref 8.4–10.5)
Chloride: 105 mEq/L (ref 96–112)
Creatinine, Ser: 1.29 mg/dL (ref 0.50–1.35)
GFR calc Af Amer: 70 mL/min — ABNORMAL LOW (ref >90)
GFR calc non Af Amer: 60 mL/min — ABNORMAL LOW (ref >90)
Glucose, Bld: 107 mg/dL — ABNORMAL HIGH (ref 70–99)
Potassium: 3.8 mEq/L (ref 3.5–5.1)
Sodium: 140 mEq/L (ref 135–145)

## 2013-05-14 LAB — PROTIME-INR
INR: 2.5 — ABNORMAL HIGH (ref 0.00–1.49)
Prothrombin Time: 25.8 seconds — ABNORMAL HIGH (ref 11.6–15.2)

## 2013-05-15 LAB — PROTIME-INR
INR: 2.52 — ABNORMAL HIGH (ref 0.00–1.49)
Prothrombin Time: 26 seconds — ABNORMAL HIGH (ref 11.6–15.2)

## 2013-05-16 LAB — PROTIME-INR
INR: 2.73 — ABNORMAL HIGH (ref 0.00–1.49)
Prothrombin Time: 27.6 seconds — ABNORMAL HIGH (ref 11.6–15.2)

## 2013-05-17 LAB — PROTIME-INR
INR: 3.38 — ABNORMAL HIGH (ref 0.00–1.49)
Prothrombin Time: 32.3 seconds — ABNORMAL HIGH (ref 11.6–15.2)

## 2013-05-18 LAB — PROTIME-INR
INR: 3.74 — ABNORMAL HIGH (ref 0.00–1.49)
Prothrombin Time: 34.8 seconds — ABNORMAL HIGH (ref 11.6–15.2)

## 2013-05-19 LAB — PREALBUMIN: Prealbumin: 15.5 mg/dL — ABNORMAL LOW (ref 17.0–34.0)

## 2013-05-19 LAB — CBC
HCT: 32 % — ABNORMAL LOW (ref 39.0–52.0)
Hemoglobin: 10.8 g/dL — ABNORMAL LOW (ref 13.0–17.0)
MCH: 28.4 pg (ref 26.0–34.0)
MCHC: 33.8 g/dL (ref 30.0–36.0)
MCV: 84.2 fL (ref 78.0–100.0)
Platelets: 204 K/uL (ref 150–400)
RBC: 3.8 MIL/uL — ABNORMAL LOW (ref 4.22–5.81)
RDW: 14.4 % (ref 11.5–15.5)
WBC: 7.1 K/uL (ref 4.0–10.5)

## 2013-05-19 LAB — BASIC METABOLIC PANEL WITH GFR
BUN: 19 mg/dL (ref 6–23)
CO2: 22 meq/L (ref 19–32)
Calcium: 8.9 mg/dL (ref 8.4–10.5)
Chloride: 109 meq/L (ref 96–112)
Creatinine, Ser: 1.14 mg/dL (ref 0.50–1.35)
GFR calc Af Amer: 81 mL/min — ABNORMAL LOW
GFR calc non Af Amer: 70 mL/min — ABNORMAL LOW
Glucose, Bld: 87 mg/dL (ref 70–99)
Potassium: 3.6 meq/L (ref 3.5–5.1)
Sodium: 141 meq/L (ref 135–145)

## 2013-05-19 LAB — PROTIME-INR
INR: 3.05 — ABNORMAL HIGH (ref 0.00–1.49)
Prothrombin Time: 29.9 s — ABNORMAL HIGH (ref 11.6–15.2)

## 2013-05-20 LAB — PROTIME-INR
INR: 2.56 — ABNORMAL HIGH (ref 0.00–1.49)
Prothrombin Time: 26.3 seconds — ABNORMAL HIGH (ref 11.6–15.2)

## 2013-05-21 LAB — PROTIME-INR
INR: 2.05 — ABNORMAL HIGH (ref 0.00–1.49)
Prothrombin Time: 22.3 seconds — ABNORMAL HIGH (ref 11.6–15.2)

## 2013-05-22 LAB — PROTIME-INR
INR: 1.88 — ABNORMAL HIGH (ref 0.00–1.49)
Prothrombin Time: 20.9 seconds — ABNORMAL HIGH (ref 11.6–15.2)

## 2013-06-07 ENCOUNTER — Emergency Department (HOSPITAL_COMMUNITY)

## 2013-06-07 ENCOUNTER — Inpatient Hospital Stay (HOSPITAL_COMMUNITY)
Admission: EM | Admit: 2013-06-07 | Discharge: 2013-06-10 | DRG: 730 | Disposition: A | Discharge status: Home / Self Care | Attending: General Surgery | Admitting: General Surgery

## 2013-06-07 ENCOUNTER — Encounter (HOSPITAL_COMMUNITY): Payer: Self-pay | Admitting: *Deleted

## 2013-06-07 DIAGNOSIS — L0591 Pilonidal cyst without abscess: Secondary | ICD-10-CM

## 2013-06-07 DIAGNOSIS — N509 Disorder of male genital organs, unspecified: Principal | ICD-10-CM | POA: Diagnosis present

## 2013-06-07 DIAGNOSIS — Z9089 Acquired absence of other organs: Secondary | ICD-10-CM

## 2013-06-07 DIAGNOSIS — Z87442 Personal history of urinary calculi: Secondary | ICD-10-CM

## 2013-06-07 DIAGNOSIS — F17200 Nicotine dependence, unspecified, uncomplicated: Secondary | ICD-10-CM | POA: Diagnosis present

## 2013-06-07 DIAGNOSIS — G43909 Migraine, unspecified, not intractable, without status migrainosus: Secondary | ICD-10-CM | POA: Diagnosis present

## 2013-06-07 LAB — BASIC METABOLIC PANEL
BUN: 21 mg/dL (ref 6–23)
CO2: 26 mEq/L (ref 19–32)
Calcium: 9.5 mg/dL (ref 8.4–10.5)
Chloride: 107 mEq/L (ref 96–112)
Creatinine, Ser: 1.16 mg/dL (ref 0.50–1.35)
GFR calc Af Amer: 79 mL/min — ABNORMAL LOW (ref >90)
GFR calc non Af Amer: 68 mL/min — ABNORMAL LOW (ref >90)
Glucose, Bld: 93 mg/dL (ref 70–99)
Potassium: 3.9 mEq/L (ref 3.5–5.1)
Sodium: 143 mEq/L (ref 135–145)

## 2013-06-07 LAB — CBC WITH DIFFERENTIAL/PLATELET
Basophils Absolute: 0 10*3/uL (ref 0.0–0.1)
Basophils Relative: 0 % (ref 0–1)
Eosinophils Absolute: 0.4 10*3/uL (ref 0.0–0.7)
Eosinophils Relative: 6 % — ABNORMAL HIGH (ref 0–5)
HCT: 33.6 % — ABNORMAL LOW (ref 39.0–52.0)
Hemoglobin: 11.1 g/dL — ABNORMAL LOW (ref 13.0–17.0)
Lymphocytes Relative: 19 % (ref 12–46)
Lymphs Abs: 1.5 10*3/uL (ref 0.7–4.0)
MCH: 28.4 pg (ref 26.0–34.0)
MCHC: 33 g/dL (ref 30.0–36.0)
MCV: 85.9 fL (ref 78.0–100.0)
Monocytes Absolute: 0.6 10*3/uL (ref 0.1–1.0)
Monocytes Relative: 8 % (ref 3–12)
Neutro Abs: 5.1 10*3/uL (ref 1.7–7.7)
Neutrophils Relative %: 67 % (ref 43–77)
Platelets: 207 10*3/uL (ref 150–400)
RBC: 3.91 MIL/uL — ABNORMAL LOW (ref 4.22–5.81)
RDW: 14.6 % (ref 11.5–15.5)
WBC: 7.6 10*3/uL (ref 4.0–10.5)

## 2013-06-07 MED ORDER — ONDANSETRON HCL 4 MG/2ML IJ SOLN
4.0000 mg | Freq: Once | INTRAMUSCULAR | Status: AC
  Administered 2013-06-07: 4 mg via INTRAVENOUS
  Filled 2013-06-07: qty 2

## 2013-06-07 MED ORDER — VANCOMYCIN HCL IN DEXTROSE 1-5 GM/200ML-% IV SOLN
1000.0000 mg | INTRAVENOUS | Status: AC
  Administered 2013-06-07 (×2): 1000 mg via INTRAVENOUS
  Filled 2013-06-07 (×2): qty 200

## 2013-06-07 MED ORDER — SODIUM CHLORIDE 0.9 % IV BOLUS (SEPSIS)
1000.0000 mL | Freq: Once | INTRAVENOUS | Status: AC
  Administered 2013-06-07: 1000 mL via INTRAVENOUS

## 2013-06-07 MED ORDER — HYDROMORPHONE HCL PF 1 MG/ML IJ SOLN: 1.0000 mg | Freq: Once | INTRAMUSCULAR | Status: AC

## 2013-06-07 MED ORDER — HYDROMORPHONE HCL PF 1 MG/ML IJ SOLN
1.0000 mg | Freq: Once | INTRAMUSCULAR | Status: AC
  Administered 2013-06-07: 1 mg via INTRAVENOUS
  Filled 2013-06-07: qty 1

## 2013-06-07 MED ORDER — IOHEXOL 300 MG/ML  SOLN
50.0000 mL | Freq: Once | INTRAMUSCULAR | Status: AC | PRN
  Administered 2013-06-07: 50 mL via ORAL

## 2013-06-07 MED ORDER — VANCOMYCIN HCL IN DEXTROSE 1-5 GM/200ML-% IV SOLN
INTRAVENOUS | Status: AC
  Filled 2013-06-07: qty 200

## 2013-06-07 MED ORDER — VANCOMYCIN HCL IN DEXTROSE 1-5 GM/200ML-% IV SOLN
1000.0000 mg | Freq: Three times a day (TID) | INTRAVENOUS | Status: DC
  Administered 2013-06-08 – 2013-06-09 (×3): 1000 mg via INTRAVENOUS
  Filled 2013-06-07 (×4): qty 200

## 2013-06-07 MED ORDER — IOHEXOL 300 MG/ML  SOLN
100.0000 mL | Freq: Once | INTRAMUSCULAR | Status: AC | PRN
  Administered 2013-06-07: 100 mL via INTRAVENOUS

## 2013-06-07 NOTE — ED Notes (Signed)
Surgery for pilonidal cyst on 04/20/13 - states felt a "bump" to buttock last night with soreness.

## 2013-06-07 NOTE — ED Provider Notes (Signed)
History  This chart was scribed for Donnetta Hutching, MD, by Yevette Edwards, ED Scribe. This patient was seen in room [facility] and the patient's care was started at 3:12 PM.   CSN: [id number]  Arrival date & time 06/07/13  1339   First MD Initiated Contact with Patient 06/07/13 1507      Chief Complaint  Patient presents with  . Wound Check    The history is provided by the patient. No language interpreter was used.   HPI Comments: Shawn Fleming is a 57 y.o. male who presents to the Emergency Department complaining of complications secondary to a debridement of a Fournier gangrene approximately 6 weeks. He states that there has been white drainage from the affected area, that the area is painful, and that he has been very nauseated. He denies emesis, fever, chills. He has a h/o of kidney calculi and migraines. The pt reports he smokes daily, but he denies drinking. Palpation makes symptoms worse. Severity is moderate to severe.  Past Medical History  Diagnosis Date  . Kidney calculi   . Migraine     Past Surgical History  Procedure Laterality Date  . Shoulder surgery    . Knee surgery    . Appendectomy    . Tonsillectomy    . Adenoidectomy    . Incision and drainage perirectal abscess N/A 04/20/2013    Procedure: SHARP SURGICAL DEBRIDEMENT PERINEAL INFECTION;  Surgeon: Fabio Bering, MD;  Location: [facility];  Service: General;  Laterality: N/A;  . Central venous catheter insertion Left 04/20/2013    Procedure: INSERTION CENTRAL LINE LEFT SUBCLAVIAN;  Surgeon: Fabio Bering, MD;  Location: [facility];  Service: General;  Laterality: Left;    No family history on file.  History  Substance Use Topics  . Smoking status: Current Every Day Smoker    Types: Cigarettes  . Smokeless tobacco: Not on file  . Alcohol Use: No     Review of Systems  A complete 10 system review of systems was obtained, and all systems were negative except where indicated in the HPI and PE.   Allergies   Review of patient's allergies indicates no known allergies.  Home Medications   Current Outpatient Rx  Name  Route  Sig  Dispense  Refill  . alfuzosin (UROXATRAL) 10 MG 24 hr tablet   Oral   Take 10 mg by mouth daily.         Marland Kitchen escitalopram (LEXAPRO) 10 MG tablet   Oral   Take 10 mg by mouth daily.         Marland Kitchen esomeprazole (NEXIUM) 40 MG capsule   Oral   Take 40 mg by mouth daily.         . naproxen (NAPROSYN) 500 MG tablet   Oral   Take 500 mg by mouth 2 (two) times daily.         . Oxycodone HCl 10 MG TABS   Oral   Take 10 mg by mouth 3 (three) times daily.         . pregabalin (LYRICA) 75 MG capsule   Oral   Take 75 mg by mouth 2 (two) times daily.         . rosuvastatin (CRESTOR) 10 MG tablet   Oral   Take 10 mg by mouth daily.         Marland Kitchen topiramate (TOPAMAX) 200 MG tablet   Oral   Take 200 mg by mouth at bedtime.         Marland Kitchen  verapamil (CALAN-SR) 240 MG CR tablet   Oral   Take 240 mg by mouth at bedtime.         . sodium chloride 0.9 % SOLN 100 mL with Ampicillin-Sulbactam 3 (2-1) G SOLR 3 g   Intravenous   Inject 3 g into the vein every 6 (six) hours.   12 g   7     Triage Vitals: BP 117/81  Pulse 87  Temp(Src) 98.4 F (36.9 C) (Oral)  Resp 20  Ht 5\' 6"  (1.676 m)  Wt 214 lb (97.07 kg)  BMI 34.56 kg/m2  SpO2 99%  Physical Exam  Nursing note and vitals reviewed. Constitutional: He is oriented to person, place, and time. He appears well-developed and well-nourished.  HENT:  Head: Normocephalic and atraumatic.  Eyes: Conjunctivae and EOM are normal. Pupils are equal, round, and reactive to light.  Neck: Normal range of motion. Neck supple.  Cardiovascular: Normal rate, regular rhythm and normal heart sounds.   Pulmonary/Chest: Effort normal and breath sounds normal.  Abdominal: Soft. Bowel sounds are normal.  Genitourinary:  Most tender on right buttock medially and superiorly with associated 1 cm indurated area.  erythematous  macerated skin in perineum.  Musculoskeletal: Normal range of motion.  Neurological: He is alert and oriented to person, place, and time.  Skin: Skin is warm and dry.  Psychiatric: He has a normal mood and affect.    ED Course  Procedures (including critical care time)  COORDINATION OF CARE:  3:33 PM- Discussed treatment plan with pt which includes blood work, I.V. antibiotics, and pain medication.  The pt agreed.   DIAGNOSTIC STUDIES: Oxygen Saturation is 99% on room air, normal by my interpretation.    Labs Reviewed  BASIC METABOLIC PANEL - Abnormal; Notable for the following:    GFR calc non Af Amer 68 (*)    GFR calc Af Amer 79 (*)    All other components within normal limits  CBC WITH DIFFERENTIAL - Abnormal; Notable for the following:    RBC 3.91 (*)    Hemoglobin 11.1 (*)    HCT 33.6 (*)    Eosinophils Relative 6 (*)    All other components within normal limits   Ct Abdomen Pelvis W Contrast  06/07/2013   *RADIOLOGY REPORT*  Clinical Data: Pilonidal cyst.  CT ABDOMEN AND PELVIS WITH CONTRAST  Technique:  Multidetector CT imaging of the abdomen and pelvis was performed following the standard protocol during bolus administration of intravenous contrast.  Contrast: 50mL OMNIPAQUE IOHEXOL 300 MG/ML  SOLN, OMNIPAQUE IOHEXOL 300 MG/ML  SOLN  Comparison: 04/20/2012.  Findings: The lung bases are clear.  No pleural effusion.  The solid abdominal organs are normal and stable.  The gallbladder is normal.  No common bile duct dilatation. There is a 4 mm left upper ureteral calculus.  The stomach, duodenum, small bowel and colon are unremarkable.  No acute inflammatory changes or mass lesions.  Moderate diverticulosis of the colon.  No mesenteric or retroperitoneal mass or adenopathy.  The aorta is normal in caliber.  The major branch vessels are patent.  The bladder, prostate gland and seminal vesicles are unremarkable. No pelvic mass, adenopathy or free pelvic fluid collections.  No  inguinal mass or adenopathy.  Stable prominent fat in the left inguinal canal.  There is a recurrent peroneal infectious process with inflammation and air in the subcutaneous tissues.  No discrete drainable abscess is identified.  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  The bony structures are unremarkable.  IMPRESSION:  1.  No acute intra-abdominal/pelvic findings. 2.  4 mm nonobstructing left upper ureteral calculus. 3.  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  No discrete drainable abscess.  MRI without and with contrast is probably the best test for further evaluation of a perianal fistula.   Original Report Authenticated By: Rudie Meyer, M.D.     No diagnosis found.    MDM  Patient had a complex debridement of Fournier's gangrene approximately 6 weeks ago.  Perineum is still healing.  There appears to be developing a small abscess in the area of the pilonidal cyst.  IV vancomycin and by mouth Augmentin. Discussed with Dr. Leticia Penna. Admit      I personally performed the services described in this documentation, which was scribed in my presence. The recorded information has been reviewed and is accurate.   Donnetta Hutching, MD 06/07/13 2245

## 2013-06-07 NOTE — Progress Notes (Signed)
ANTIBIOTIC CONSULT NOTE - INITIAL  Pharmacy Consult for Vancomycin Indication: abscess of perineum  No Known Allergies  Patient Measurements: Height: 5\' 6"  (167.6 cm) Weight: 214 lb (97.07 kg) IBW/kg (Calculated) : 63.8  Vital Signs: Temp: 98.4 F (36.9 C) (06/21 1347) Temp src: Oral (06/21 1347) BP: 106/69 mmHg (06/21 1835) Pulse Rate: 67 (06/21 1835) Intake/Output from previous day:   Intake/Output from this shift:    Labs:  Recent Labs  06/07/13 1652  WBC 7.6  HGB 11.1*  PLT 207  CREATININE 1.16   Estimated Creatinine Clearance: 76.6 ml/min (by C-G formula based on Cr of 1.16). No results found for this basename: VANCOTROUGH, VANCOPEAK, VANCORANDOM, GENTTROUGH, GENTPEAK, GENTRANDOM, TOBRATROUGH, TOBRAPEAK, TOBRARND, AMIKACINPEAK, AMIKACINTROU, AMIKACIN,  in the last 72 hours   Microbiology: No results found for this or any previous visit (from the past 720 hour(s)).  Medical History: Past Medical History  Diagnosis Date  . Kidney calculi   . Migraine     Medications:  Scheduled:    Assessment: 57 yo M s/p complex debridement of Fournier's gangrene approximately 6 weeks ago.  He is now developing an abscess in the area of the pilonidal cyst.  Started on IV Vancomycin and oral Augmentin. Renal function is stable at patient's baseline.     Goal of Therapy:  Vancomycin trough level 15-20 mcg/ml  Plan:  1) Vancomycin 2gm IV load x1 then 1gm IV q8h 2) Check Vancomycin trough at steady state 3) Monitor renal function and cx data   Shawn Fleming 06/07/2013,10:25 PM

## 2013-06-08 LAB — CBC
HCT: 34.7 % — ABNORMAL LOW (ref 39.0–52.0)
Hemoglobin: 11.6 g/dL — ABNORMAL LOW (ref 13.0–17.0)
MCH: 28.6 pg (ref 26.0–34.0)
MCHC: 33.4 g/dL (ref 30.0–36.0)
MCV: 85.7 fL (ref 78.0–100.0)
Platelets: 187 10*3/uL (ref 150–400)
RBC: 4.05 MIL/uL — ABNORMAL LOW (ref 4.22–5.81)
RDW: 14.5 % (ref 11.5–15.5)
WBC: 6.1 10*3/uL (ref 4.0–10.5)

## 2013-06-08 LAB — CREATININE, SERUM
Creatinine, Ser: 1.15 mg/dL (ref 0.50–1.35)
GFR calc Af Amer: 80 mL/min — ABNORMAL LOW (ref >90)
GFR calc non Af Amer: 69 mL/min — ABNORMAL LOW (ref >90)

## 2013-06-08 LAB — MRSA PCR SCREENING: MRSA by PCR: NEGATIVE

## 2013-06-08 MED ORDER — LACTATED RINGERS IV SOLN
INTRAVENOUS | Status: DC
  Administered 2013-06-08 – 2013-06-09 (×2): via INTRAVENOUS

## 2013-06-08 MED ORDER — PANTOPRAZOLE SODIUM 40 MG IV SOLR
40.0000 mg | Freq: Every day | INTRAVENOUS | Status: DC
  Administered 2013-06-08 – 2013-06-09 (×2): 40 mg via INTRAVENOUS
  Filled 2013-06-08 (×2): qty 40

## 2013-06-08 MED ORDER — HYDROMORPHONE HCL PF 1 MG/ML IJ SOLN
1.0000 mg | INTRAMUSCULAR | Status: AC | PRN
  Administered 2013-06-08 (×2): 1 mg via INTRAVENOUS
  Filled 2013-06-08 (×2): qty 1

## 2013-06-08 MED ORDER — ENOXAPARIN SODIUM 40 MG/0.4ML ~~LOC~~ SOLN
40.0000 mg | Freq: Every day | SUBCUTANEOUS | Status: DC
  Administered 2013-06-08 – 2013-06-10 (×3): 40 mg via SUBCUTANEOUS
  Filled 2013-06-08 (×3): qty 0.4

## 2013-06-08 MED ORDER — HYDROMORPHONE HCL PF 1 MG/ML IJ SOLN
1.0000 mg | INTRAMUSCULAR | Status: DC | PRN
  Administered 2013-06-08 – 2013-06-10 (×8): 1 mg via INTRAVENOUS
  Filled 2013-06-08 (×9): qty 1

## 2013-06-08 MED ORDER — AMOXICILLIN-POT CLAVULANATE 500-125 MG PO TABS
1.0000 | ORAL_TABLET | Freq: Two times a day (BID) | ORAL | Status: DC
  Administered 2013-06-08 (×2): 500 mg via ORAL
  Filled 2013-06-08 (×5): qty 1

## 2013-06-08 MED ORDER — HYDROCODONE-ACETAMINOPHEN 5-325 MG PO TABS
1.0000 | ORAL_TABLET | ORAL | Status: DC | PRN
  Administered 2013-06-09: 1 via ORAL
  Filled 2013-06-08: qty 1

## 2013-06-08 MED ORDER — ONDANSETRON HCL 4 MG/2ML IJ SOLN: 4.0000 mg | Freq: Three times a day (TID) | INTRAMUSCULAR | Status: AC | PRN

## 2013-06-08 MED ORDER — ONDANSETRON HCL 4 MG/2ML IJ SOLN: 4.0000 mg | Freq: Four times a day (QID) | INTRAMUSCULAR | Status: DC | PRN

## 2013-06-08 MED ORDER — AMOXICILLIN-POT CLAVULANATE 875-125 MG PO TABS
1.0000 | ORAL_TABLET | Freq: Two times a day (BID) | ORAL | Status: DC
  Administered 2013-06-08 – 2013-06-10 (×4): 1 via ORAL
  Filled 2013-06-08 (×8): qty 1

## 2013-06-08 MED ORDER — PREGABALIN 75 MG PO CAPS
75.0000 mg | ORAL_CAPSULE | Freq: Two times a day (BID) | ORAL | Status: DC
  Administered 2013-06-08 – 2013-06-10 (×4): 75 mg via ORAL
  Filled 2013-06-08 (×4): qty 1

## 2013-06-08 NOTE — H&P (Signed)
Shawn Fleming is an 57 y.o. male.   Chief Complaint: Increased pain in the perirectal/perineal region  HPI: Patient presented to [facility] with increasing pain in the perirectal region. He is several months out now from extensive dissection and debridement for necrotizing fasciitis/Fournier's gangrene of the perineum. He has continued local wound care with packing. He continues to have some discharge and drainage. Over the last several days she's noted some increased pain mostly in the upper perirectal gluteal region. He denies any increase in drainage. He has had some chills but no significant fevers or chills. Some nausea but no emesis. No change in bowel movements.  Past Medical History  Diagnosis Date  . Kidney calculi   . Migraine     Past Surgical History  Procedure Laterality Date  . Shoulder surgery    . Knee surgery    . Appendectomy    . Tonsillectomy    . Adenoidectomy    . Incision and drainage perirectal abscess N/A 04/20/2013    Procedure: SHARP SURGICAL DEBRIDEMENT PERINEAL INFECTION;  Surgeon: Fabio Bering, MD;  Location: [facility];  Service: General;  Laterality: N/A;  . Central venous catheter insertion Left 04/20/2013    Procedure: INSERTION CENTRAL LINE LEFT SUBCLAVIAN;  Surgeon: Fabio Bering, MD;  Location: [facility];  Service: General;  Laterality: Left;    No family history on file. Social History:  reports that he has been smoking Cigarettes.  He has been smoking about 0.00 packs per day. He does not have any smokeless tobacco history on file. He reports that he does not drink alcohol or use illicit drugs.  Allergies: No Known Allergies  Medications Prior to Admission  Medication Sig Dispense Refill  . alfuzosin (UROXATRAL) 10 MG 24 hr tablet Take 10 mg by mouth daily.      Marland Kitchen escitalopram (LEXAPRO) 10 MG tablet Take 10 mg by mouth daily.      Marland Kitchen esomeprazole (NEXIUM) 40 MG capsule Take 40 mg by mouth daily.      . naproxen (NAPROSYN) 500 MG tablet Take  500 mg by mouth 2 (two) times daily.      . Oxycodone HCl 10 MG TABS Take 10 mg by mouth 3 (three) times daily.      . pregabalin (LYRICA) 75 MG capsule Take 75 mg by mouth 2 (two) times daily.      . rosuvastatin (CRESTOR) 10 MG tablet Take 10 mg by mouth daily.      Marland Kitchen topiramate (TOPAMAX) 200 MG tablet Take 200 mg by mouth at bedtime.      . verapamil (CALAN-SR) 240 MG CR tablet Take 240 mg by mouth at bedtime.      . sodium chloride 0.9 % SOLN 100 mL with Ampicillin-Sulbactam 3 (2-1) G SOLR 3 g Inject 3 g into the vein every 6 (six) hours.  12 g  7    Results for orders placed during the hospital encounter of 06/07/13 (from the past 48 hour(s))  BASIC METABOLIC PANEL     Status: Abnormal   Collection Time    06/07/13  4:52 PM      Result Value Range   Sodium 143  135 - 145 mEq/L   Potassium 3.9  3.5 - 5.1 mEq/L   Chloride 107  96 - 112 mEq/L   CO2 26  19 - 32 mEq/L   Glucose, Bld 93  70 - 99 mg/dL   BUN 21  6 - 23 mg/dL  Creatinine, Ser 1.16  0.50 - 1.35 mg/dL   Calcium 9.5  8.4 - 16.1 mg/dL   GFR calc non Af Amer 68 (*) >90 mL/min   GFR calc Af Amer 79 (*) >90 mL/min   Comment:            The eGFR has been calculated     using the CKD EPI equation.     This calculation has not been     validated in all clinical     situations.     eGFR's persistently     <90 mL/min signify     possible Chronic Kidney Disease.  CBC WITH DIFFERENTIAL     Status: Abnormal   Collection Time    06/07/13  4:52 PM      Result Value Range   WBC 7.6  4.0 - 10.5 K/uL   RBC 3.91 (*) 4.22 - 5.81 MIL/uL   Hemoglobin 11.1 (*) 13.0 - 17.0 g/dL   HCT 09.6 (*) 04.5 - 40.9 %   MCV 85.9  78.0 - 100.0 fL   MCH 28.4  26.0 - 34.0 pg   MCHC 33.0  30.0 - 36.0 g/dL   RDW 81.1  91.4 - 78.2 %   Platelets 207  150 - 400 K/uL   Neutrophils Relative % 67  43 - 77 %   Neutro Abs 5.1  1.7 - 7.7 K/uL   Lymphocytes Relative 19  12 - 46 %   Lymphs Abs 1.5  0.7 - 4.0 K/uL   Monocytes Relative 8  3 - 12 %    Monocytes Absolute 0.6  0.1 - 1.0 K/uL   Eosinophils Relative 6 (*) 0 - 5 %   Eosinophils Absolute 0.4  0.0 - 0.7 K/uL   Basophils Relative 0  0 - 1 %   Basophils Absolute 0.0  0.0 - 0.1 K/uL  MRSA PCR SCREENING     Status: None   Collection Time    06/08/13  1:01 AM      Result Value Range   MRSA by PCR NEGATIVE  NEGATIVE   Comment:            The GeneXpert MRSA Assay (FDA     approved for NASAL specimens     only), is one component of a     comprehensive MRSA colonization     surveillance program. It is not     intended to diagnose MRSA     infection nor to guide or     monitor treatment for     MRSA infections.   Ct Abdomen Pelvis W Contrast  06/07/2013   *RADIOLOGY REPORT*  Clinical Data: Pilonidal cyst.  CT ABDOMEN AND PELVIS WITH CONTRAST  Technique:  Multidetector CT imaging of the abdomen and pelvis was performed following the standard protocol during bolus administration of intravenous contrast.  Contrast: 50mL OMNIPAQUE IOHEXOL 300 MG/ML  SOLN, OMNIPAQUE IOHEXOL 300 MG/ML  SOLN  Comparison: 04/20/2012.  Findings: The lung bases are clear.  No pleural effusion.  The solid abdominal organs are normal and stable.  The gallbladder is normal.  No common bile duct dilatation. There is a 4 mm left upper ureteral calculus.  The stomach, duodenum, small bowel and colon are unremarkable.  No acute inflammatory changes or mass lesions.  Moderate diverticulosis of the colon.  No mesenteric or retroperitoneal mass or adenopathy.  The aorta is normal in caliber.  The major branch vessels are patent.  The bladder, prostate gland and  seminal vesicles are unremarkable. No pelvic mass, adenopathy or free pelvic fluid collections.  No inguinal mass or adenopathy.  Stable prominent fat in the left inguinal canal.  There is a recurrent peroneal infectious process with inflammation and air in the subcutaneous tissues.  No discrete drainable abscess is identified.  Findings could be due to a perianal  fistula.  The bony structures are unremarkable.  IMPRESSION:  1.  No acute intra-abdominal/pelvic findings. 2.  4 mm nonobstructing left upper ureteral calculus. 3.  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  No discrete drainable abscess.  MRI without and with contrast is probably the best test for further evaluation of a perianal fistula.   Original Report Authenticated By: Rudie Meyer, M.D.    Review of Systems  Constitutional: Positive for fever and chills.  HENT: Negative.   Eyes: Negative.   Respiratory: Negative.   Cardiovascular: Negative.   Gastrointestinal: Negative.   Genitourinary: Negative.   Musculoskeletal: Negative.   Skin: Negative.   Neurological: Negative.   Endo/Heme/Allergies: Negative.     Blood pressure 106/70, pulse 69, temperature 98 F (36.7 C), temperature source Oral, resp. rate 18, height 5\' 6"  (1.676 m), weight 97.4 kg (214 lb 11.7 oz), SpO2 99.00%. Physical Exam  Constitutional: He is oriented to person, place, and time. He appears well-developed and well-nourished. No distress.  HENT:  Head: Normocephalic and atraumatic.  Eyes: Conjunctivae and EOM are normal. Pupils are equal, round, and reactive to light. No scleral icterus.  Neck: Normal range of motion. Neck supple.  Cardiovascular: Normal rate, regular rhythm and normal heart sounds.   Respiratory: Effort normal and breath sounds normal. No respiratory distress.  GI: Soft. Bowel sounds are normal. He exhibits no distension and no mass. There is no tenderness. There is no rebound and no guarding.  Genitourinary:     Lymphadenopathy:    He has no cervical adenopathy.  Neurological: He is alert and oriented to person, place, and time.  Skin: Skin is warm and dry.     Assessment/Plan Perirectal/perineal wounds. At this time his wounds do appear relatively clean. CT does not demonstrate any evidence of any absolute undrained area and I do not appreciate any areas of  fluctuance on his exam. I do feel continued close monitoring, antibiotic management, pain control, and local wound care will be of benefit. Should his clinical presentation persist despite current management I do feel it is relevant to return to the operating room to aggressively clean the wounds. I do not see any evidence of this time of recurrence of his Fournier's gangrene. Again the wounds looked to be progressing with good granulation tissue formation. I did discuss with the patient the possibility of having a perirectal abscess. We discuss this is a possibility for an initial source of his Margaretmary Lombard is or a resulting factor from his Margaretmary Lombard is. Additionally do to the tracking of several his wounds I discussed with the patient that the CT evidence of may just be evidence of the tracking of the wound rather than a true fistulous tract. Regardless this would not be something that we would aggressively treat at this time and I see clinically demonstrates progression.  Deah Ottaway C 06/08/2013, 2:25 PM

## 2013-06-09 LAB — CBC
HCT: 32.2 % — ABNORMAL LOW (ref 39.0–52.0)
Hemoglobin: 10.6 g/dL — ABNORMAL LOW (ref 13.0–17.0)
MCH: 28.3 pg (ref 26.0–34.0)
MCHC: 32.9 g/dL (ref 30.0–36.0)
MCV: 85.9 fL (ref 78.0–100.0)
Platelets: 191 10*3/uL (ref 150–400)
RBC: 3.75 MIL/uL — ABNORMAL LOW (ref 4.22–5.81)
RDW: 14.6 % (ref 11.5–15.5)
WBC: 5.6 10*3/uL (ref 4.0–10.5)

## 2013-06-09 LAB — BASIC METABOLIC PANEL
BUN: 16 mg/dL (ref 6–23)
CO2: 26 mEq/L (ref 19–32)
Calcium: 8.5 mg/dL (ref 8.4–10.5)
Chloride: 109 mEq/L (ref 96–112)
Creatinine, Ser: 1.07 mg/dL (ref 0.50–1.35)
GFR calc Af Amer: 87 mL/min — ABNORMAL LOW (ref >90)
GFR calc non Af Amer: 75 mL/min — ABNORMAL LOW (ref >90)
Glucose, Bld: 91 mg/dL (ref 70–99)
Potassium: 3.7 mEq/L (ref 3.5–5.1)
Sodium: 142 mEq/L (ref 135–145)

## 2013-06-09 LAB — VANCOMYCIN, TROUGH: Vancomycin Tr: 27.7 ug/mL (ref 10.0–20.0)

## 2013-06-09 MED ORDER — SODIUM CHLORIDE 0.9 % IV SOLN
1250.0000 mg | Freq: Two times a day (BID) | INTRAVENOUS | Status: DC
  Administered 2013-06-09 – 2013-06-10 (×2): 1250 mg via INTRAVENOUS
  Filled 2013-06-09 (×4): qty 1250

## 2013-06-09 NOTE — Progress Notes (Signed)
ANTIBIOTIC CONSULT NOTE - INITIAL  Pharmacy Consult for Vancomycin Indication: abscess of perineum  No Known Allergies  Patient Measurements: Height: 5\' 6"  (167.6 cm) Weight: 214 lb 11.7 oz (97.4 kg) IBW/kg (Calculated) : 63.8  Vital Signs:   Intake/Output from previous day: 06/22 0701 - 06/23 0700 In: 1760 [P.O.:120; I.V.:1040; IV Piggyback:600] Out: -  Intake/Output from this shift: Total I/O In: 630 [P.O.:480; I.V.:150] Out: -   Labs:  Recent Labs  06/07/13 1652 06/08/13 1451 06/09/13 0506  WBC 7.6 6.1 5.6  HGB 11.1* 11.6* 10.6*  PLT 207 187 191  CREATININE 1.16 1.15 1.07   Estimated Creatinine Clearance: 83.2 ml/min (by C-G formula based on Cr of 1.07).  Recent Labs  06/09/13 0907  VANCOTROUGH 27.7*     Microbiology: Recent Results (from the past 720 hour(s))  MRSA PCR SCREENING     Status: None   Collection Time    06/08/13  1:01 AM      Result Value Range Status   MRSA by PCR NEGATIVE  NEGATIVE Final   Comment:            The GeneXpert MRSA Assay (FDA     approved for NASAL specimens     only), is one component of a     comprehensive MRSA colonization     surveillance program. It is not     intended to diagnose MRSA     infection nor to guide or     monitor treatment for     MRSA infections.    Medical History: Past Medical History  Diagnosis Date  . Kidney calculi   . Migraine     Medications:  Scheduled:  . amoxicillin-clavulanate  1 tablet Oral Q12H  . enoxaparin (LOVENOX) injection  40 mg Subcutaneous Daily  . pantoprazole (PROTONIX) IV  40 mg Intravenous QHS  . pregabalin  75 mg Oral BID  . vancomycin  1,000 mg Intravenous Q8H    Assessment: 57 yo M s/p complex debridement of Fournier's gangrene approximately 6 weeks ago.  He is now developing an abscess in the area of the pilonidal cyst.  Started on IV Vancomycin and oral Augmentin.  SCr is stable.  Trough level is above goal.    Goal of Therapy:  Vancomycin trough level  15-20 mcg/ml  Plan:  1) Reduce Vancomycin to 1250mg  IV q12hrs 2) Re-Check Vancomycin trough at steady state 3) Monitor renal function and cx data   Valrie Hart A 06/09/2013,10:27 AM

## 2013-06-09 NOTE — Progress Notes (Signed)
Subjective: Pain about the same. No nausea vomiting.  Objective: Vital signs in last 24 hours: Temp:  [97.8 F (36.6 C)-98.4 F (36.9 C)] 97.8 F (36.6 C) (06/23 1148) Pulse Rate:  [72-79] 79 (06/22 1942) Resp:  [18] 18 (06/22 1942) BP: (108-126)/(73-82) 126/82 mmHg (06/22 1942) SpO2:  [98 %] 98 % (06/22 1942) Last BM Date: 06/08/13  Intake/Output from previous day: 06/22 0701 - 06/23 0700 In: 1760 [P.O.:120; I.V.:1040; IV Piggyback:600] Out: -  Intake/Output this shift: Total I/O In: 750 [P.O.:600; I.V.:150] Out: -   General appearance: alert and no distress GI: Perirectal region: Positive granulation tissue. Some serous sanguinous discharge on minimal discharge. No significant odor. Perineal wound is packed.  Lab Results:   Recent Labs  06/08/13 1451 06/09/13 0506  WBC 6.1 5.6  HGB 11.6* 10.6*  HCT 34.7* 32.2*  PLT 187 191   BMET  Recent Labs  06/07/13 1652 06/08/13 1451 06/09/13 0506  NA 143  --  142  K 3.9  --  3.7  CL 107  --  109  CO2 26  --  26  GLUCOSE 93  --  91  BUN 21  --  16  CREATININE 1.16 1.15 1.07  CALCIUM 9.5  --  8.5   PT/INR No results found for this basename: LABPROT, INR,  in the last 72 hours ABG No results found for this basename: PHART, PCO2, PO2, HCO3,  in the last 72 hours  Studies/Results: Ct Abdomen Pelvis W Contrast  06/07/2013   *RADIOLOGY REPORT*  Clinical Data: Pilonidal cyst.  CT ABDOMEN AND PELVIS WITH CONTRAST  Technique:  Multidetector CT imaging of the abdomen and pelvis was performed following the standard protocol during bolus administration of intravenous contrast.  Contrast: 50mL OMNIPAQUE IOHEXOL 300 MG/ML  SOLN, OMNIPAQUE IOHEXOL 300 MG/ML  SOLN  Comparison: 04/20/2012.  Findings: The lung bases are clear.  No pleural effusion.  The solid abdominal organs are normal and stable.  The gallbladder is normal.  No common bile duct dilatation. There is a 4 mm left upper ureteral calculus.  The stomach,  duodenum, small bowel and colon are unremarkable.  No acute inflammatory changes or mass lesions.  Moderate diverticulosis of the colon.  No mesenteric or retroperitoneal mass or adenopathy.  The aorta is normal in caliber.  The major branch vessels are patent.  The bladder, prostate gland and seminal vesicles are unremarkable. No pelvic mass, adenopathy or free pelvic fluid collections.  No inguinal mass or adenopathy.  Stable prominent fat in the left inguinal canal.  There is a recurrent peroneal infectious process with inflammation and air in the subcutaneous tissues.  No discrete drainable abscess is identified.  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  The bony structures are unremarkable.  IMPRESSION:  1.  No acute intra-abdominal/pelvic findings. 2.  4 mm nonobstructing left upper ureteral calculus. 3.  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  No discrete drainable abscess.  MRI without and with contrast is probably the best test for further evaluation of a perianal fistula.   Original Report Authenticated By: Rudie Meyer, M.D.    Anti-infectives: Anti-infectives   Start     Dose/Rate Route Frequency Ordered Stop   06/09/13 2200  vancomycin (VANCOCIN) 1,250 mg in sodium chloride 0.9 % 250 mL IVPB     1,250 mg 166.7 mL/hr over 90 Minutes Intravenous Every 12 hours 06/09/13 1030     06/08/13 2200  amoxicillin-clavulanate (AUGMENTIN) 875-125 MG per  tablet 1 tablet     1 tablet Oral Every 12 hours 06/08/13 1425     06/08/13 1000  vancomycin (VANCOCIN) IVPB 1000 mg/200 mL premix  Status:  Discontinued     1,000 mg 200 mL/hr over 60 Minutes Intravenous Every 8 hours 06/07/13 2225 06/09/13 1030   06/08/13 0116  amoxicillin-clavulanate (AUGMENTIN) 500-125 MG per tablet 500 mg  Status:  Discontinued     1 tablet Oral 2 times daily 06/08/13 0116 06/08/13 1425   06/07/13 1645  vancomycin (VANCOCIN) IVPB 1000 mg/200 mL premix     1,000 mg 200 mL/hr over 60 Minutes  Intravenous Every 1 hr x 2 06/07/13 1641 06/07/13 1910      Assessment/Plan: s/p * No surgery found * Overall patient is demonstrating some progress. No evidence of new fluctuance or erythema. As discussed with patient we'll monitor for the next 24 hours and likely discharge first thing in the morning.  LOS: 2 days    Kendrew Paci C 06/09/2013

## 2013-06-09 NOTE — Progress Notes (Signed)
UR Chart Review Completed  

## 2013-06-09 NOTE — Progress Notes (Signed)
PT TRANSFERING TO ROOM 336. PT ALERT AND ORIENTED. DENIES ANY PAIN AT THIS TIME. PERI/ANAL DRSG . DRY AND INTACT. IV NSL PATENT. VVS AFEBRILE.. NO NAUSEA OR VOMITING.TRANSFER REPORT CALLED TO LEIGHANN RN ON 300.

## 2013-06-10 MED ORDER — AMOXICILLIN-POT CLAVULANATE 875-125 MG PO TABS: 1.0000 | ORAL_TABLET | Freq: Two times a day (BID) | ORAL | Status: DC

## 2013-06-10 MED ORDER — HYDROCODONE-ACETAMINOPHEN 5-325 MG PO TABS: 1.0000 | ORAL_TABLET | ORAL | Status: DC | PRN

## 2013-06-10 NOTE — Progress Notes (Signed)
Changed dressing. Pt tolerated procedure well. Cleansed site with normal saline,  Removed old packing and replaced with new iodoform gauze. Will continue to monitor.

## 2013-06-10 NOTE — Progress Notes (Signed)
Pt is to be discharged home today. Pt is in NAD, IV is out, all paperwork has been reviewed/discussed with patient, and there are no questions/concerns at this time. Assessment is unchanged from this morning. Pt is to be accompanied downstairs by staff and family via wheelchair.  

## 2013-06-10 NOTE — Care Management Note (Signed)
    Page 1 of 1   06/10/2013     2:20:46 PM   CARE MANAGEMENT NOTE 06/10/2013  Patient:  Shawn Fleming, Shawn Fleming   Account Number:  [id number]  Date Initiated:  06/09/2013  Documentation initiated by:  Anibal Henderson  Subjective/Objective Assessment:   Admitted with increased pain in perineal wound. On IV vancomycin. Pt lives at home alon, but his ex-wife is assisting with his wound care, and his children are assisting with shopping needs, etc.     Action/Plan:   he denies other needs. Will follow for other CM needs. Pt went to [facility] previously.   Anticipated DC Date:  06/11/2013   Anticipated DC Plan:  HOME/SELF CARE      DC Planning Services  CM consult      Choice offered to / List presented to:             Status of service:  Completed, signed off Medicare Important Message given?   (If response is "NO", the following Medicare IM given date fields will be blank) Date Medicare IM given:   Date Additional Medicare IM given:    Discharge Disposition:  HOME/SELF CARE  Per UR Regulation:  Reviewed for med. necessity/level of care/duration of stay  If discussed at Long Length of Stay Meetings, dates discussed:    Comments:  06/09/13 1300 Anibal Henderson RN/CM

## 2013-06-11 NOTE — Discharge Summary (Signed)
Physician Discharge Summary  Patient ID: Shawn Fleming MRN: [id number] DOB/AGE: August 22, 1956 57 y.o.  Admit date: 06/07/2013 Discharge date: 06/10/2013  Admission Diagnoses:  Increased perineal pain, drainage  Discharge Diagnoses: The same Active Problems:   * No active hospital problems. *   Discharged Condition: stable  Hospital Course: Patient presents to [facility] with increased swelling, pain and drainage from his perineum/perirectal region. Patient has a history of Fournier gangrene and debridement which he has been recovering from over the last several months. His wound care at home has been unremarkable however he noted increasing pain and swelling was concerned that he may have a recurrence of his infectious process. There was noted to be increased inflammatory changes on the CT of the abdomen and pelvis. He was admitted for continued IV fluid hydration and aggressive wound care. His symptomatology did improve somewhat. Wound care was continued. He was continued on oral antibiotics and plans are now to discharge to home with a 10 day course of oral antibiotics. Local wound care again was discussed with patient. Plans are made for discharge.  Consults: None  Significant Diagnostic Studies: labs: Daily  Treatments: IV hydration and antibiotics: Augmentin  Discharge Exam: Blood pressure 100/63, pulse 71, temperature 97.6 F (36.4 C), temperature source Oral, resp. rate 18, height 5\' 6"  (1.676 m), weight 97.4 kg (214 lb 11.7 oz), SpO2 98.00%. General appearance: alert and no distress Resp: clear to auscultation bilaterally Cardio: regular rate and rhythm Pelvic: Perineum: Multiple healing wounds around the anal opening and perineum including the posterior aspects of the scrotum. The scrotal wound is the deepest coursing approximately 4 cm. Drainage is serous sanguinous. The remaining wounds are well granulated with granulation tissue being extremely shallow. There is some  induration around all 5 of the wounds but no apparent fluctuance. No crepitance  Disposition: 01-Home or Self Care  Discharge Orders   Future Orders Complete By Expires     Diet - low sodium heart healthy  As directed     Discharge instructions  As directed     Comments:      Continue wound care as before.  Call with any concerning changes or increase in fever or chills.    Increase activity slowly  As directed         Medication List    STOP taking these medications       sodium chloride 0.9 % SOLN 100 mL with Ampicillin-Sulbactam 3 (2-1) G SOLR 3 g      TAKE these medications       alfuzosin 10 MG 24 hr tablet  Commonly known as:  UROXATRAL  Take 10 mg by mouth daily.     amoxicillin-clavulanate 875-125 MG per tablet  Commonly known as:  AUGMENTIN  Take 1 tablet by mouth every 12 (twelve) hours.     escitalopram 10 MG tablet  Commonly known as:  LEXAPRO  Take 10 mg by mouth daily.     esomeprazole 40 MG capsule  Commonly known as:  NEXIUM  Take 40 mg by mouth daily.     HYDROcodone-acetaminophen 5-325 MG per tablet  Commonly known as:  NORCO/VICODIN  Take 1-2 tablets by mouth every 4 (four) hours as needed.     naproxen 500 MG tablet  Commonly known as:  NAPROSYN  Take 500 mg by mouth 2 (two) times daily.     Oxycodone HCl 10 MG Tabs  Take 10 mg by mouth 3 (three) times daily.  pregabalin 75 MG capsule  Commonly known as:  LYRICA  Take 75 mg by mouth 2 (two) times daily.     rosuvastatin 10 MG tablet  Commonly known as:  CRESTOR  Take 10 mg by mouth daily.     topiramate 200 MG tablet  Commonly known as:  TOPAMAX  Take 200 mg by mouth at bedtime.     verapamil 240 MG CR tablet  Commonly known as:  CALAN-SR  Take 240 mg by mouth at bedtime.           Follow-up Information   Follow up with Fabio Bering, MD In 1 week.   Contact information:   [address] [place] [address] [phone]       Signed: Fabio Bering 06/11/2013, 12:28 PM

## 2013-11-08 ENCOUNTER — Emergency Department (HOSPITAL_COMMUNITY)
Admission: EM | Admit: 2013-11-08 | Discharge: 2013-11-08 | Disposition: A | Discharge status: Home / Self Care | Attending: Emergency Medicine | Admitting: Emergency Medicine

## 2013-11-08 ENCOUNTER — Emergency Department (HOSPITAL_COMMUNITY)

## 2013-11-08 ENCOUNTER — Encounter (HOSPITAL_COMMUNITY): Payer: Self-pay | Admitting: Emergency Medicine

## 2013-11-08 DIAGNOSIS — M25521 Pain in right elbow: Secondary | ICD-10-CM

## 2013-11-08 DIAGNOSIS — M25529 Pain in unspecified elbow: Secondary | ICD-10-CM | POA: Insufficient documentation

## 2013-11-08 DIAGNOSIS — F17200 Nicotine dependence, unspecified, uncomplicated: Secondary | ICD-10-CM | POA: Insufficient documentation

## 2013-11-08 DIAGNOSIS — R42 Dizziness and giddiness: Secondary | ICD-10-CM | POA: Insufficient documentation

## 2013-11-08 DIAGNOSIS — R209 Unspecified disturbances of skin sensation: Secondary | ICD-10-CM | POA: Insufficient documentation

## 2013-11-08 DIAGNOSIS — R319 Hematuria, unspecified: Secondary | ICD-10-CM | POA: Insufficient documentation

## 2013-11-08 DIAGNOSIS — Z87442 Personal history of urinary calculi: Secondary | ICD-10-CM | POA: Insufficient documentation

## 2013-11-08 DIAGNOSIS — R0602 Shortness of breath: Secondary | ICD-10-CM | POA: Insufficient documentation

## 2013-11-08 DIAGNOSIS — Z792 Long term (current) use of antibiotics: Secondary | ICD-10-CM | POA: Insufficient documentation

## 2013-11-08 DIAGNOSIS — Z79899 Other long term (current) drug therapy: Secondary | ICD-10-CM | POA: Insufficient documentation

## 2013-11-08 DIAGNOSIS — G43909 Migraine, unspecified, not intractable, without status migrainosus: Secondary | ICD-10-CM | POA: Insufficient documentation

## 2013-11-08 LAB — CBC WITH DIFFERENTIAL/PLATELET
Basophils Absolute: 0 10*3/uL (ref 0.0–0.1)
Basophils Relative: 0 % (ref 0–1)
Eosinophils Absolute: 0.2 10*3/uL (ref 0.0–0.7)
Eosinophils Relative: 3 % (ref 0–5)
HCT: 34.8 % — ABNORMAL LOW (ref 39.0–52.0)
Hemoglobin: 11.6 g/dL — ABNORMAL LOW (ref 13.0–17.0)
Lymphocytes Relative: 24 % (ref 12–46)
Lymphs Abs: 1.6 10*3/uL (ref 0.7–4.0)
MCH: 28.2 pg (ref 26.0–34.0)
MCHC: 33.3 g/dL (ref 30.0–36.0)
MCV: 84.5 fL (ref 78.0–100.0)
Monocytes Absolute: 0.7 10*3/uL (ref 0.1–1.0)
Monocytes Relative: 10 % (ref 3–12)
Neutro Abs: 4.1 10*3/uL (ref 1.7–7.7)
Neutrophils Relative %: 63 % (ref 43–77)
Platelets: 166 10*3/uL (ref 150–400)
RBC: 4.12 MIL/uL — ABNORMAL LOW (ref 4.22–5.81)
RDW: 16.6 % — ABNORMAL HIGH (ref 11.5–15.5)
WBC: 6.6 10*3/uL (ref 4.0–10.5)

## 2013-11-08 LAB — URINALYSIS, ROUTINE W REFLEX MICROSCOPIC
Bilirubin Urine: NEGATIVE
Glucose, UA: NEGATIVE mg/dL
Ketones, ur: NEGATIVE mg/dL
Leukocytes, UA: NEGATIVE
Nitrite: NEGATIVE
Protein, ur: NEGATIVE mg/dL
Specific Gravity, Urine: 1.025 (ref 1.005–1.030)
Urobilinogen, UA: 0.2 mg/dL (ref 0.0–1.0)
pH: 6 (ref 5.0–8.0)

## 2013-11-08 LAB — COMPREHENSIVE METABOLIC PANEL
ALT: 13 U/L (ref 0–53)
AST: 16 U/L (ref 0–37)
Albumin: 4.1 g/dL (ref 3.5–5.2)
Alkaline Phosphatase: 77 U/L (ref 39–117)
BUN: 23 mg/dL (ref 6–23)
CO2: 26 mEq/L (ref 19–32)
Calcium: 8.8 mg/dL (ref 8.4–10.5)
Chloride: 100 mEq/L (ref 96–112)
Creatinine, Ser: 1.23 mg/dL (ref 0.50–1.35)
GFR calc Af Amer: 74 mL/min — ABNORMAL LOW (ref >90)
GFR calc non Af Amer: 64 mL/min — ABNORMAL LOW (ref >90)
Glucose, Bld: 83 mg/dL (ref 70–99)
Potassium: 3.6 mEq/L (ref 3.5–5.1)
Sodium: 133 mEq/L — ABNORMAL LOW (ref 135–145)
Total Bilirubin: 0.2 mg/dL — ABNORMAL LOW (ref 0.3–1.2)
Total Protein: 7 g/dL (ref 6.0–8.3)

## 2013-11-08 LAB — PRO B NATRIURETIC PEPTIDE: Pro B Natriuretic peptide (BNP): 64.2 pg/mL (ref 0–125)

## 2013-11-08 LAB — LIPASE, BLOOD: Lipase: 23 U/L (ref 11–59)

## 2013-11-08 LAB — TROPONIN I: Troponin I: <0.3 ng/mL (ref <0.30)

## 2013-11-08 LAB — URINE MICROSCOPIC-ADD ON

## 2013-11-08 LAB — D-DIMER, QUANTITATIVE: D-Dimer, Quant: <0.27 ug/mL-FEU (ref 0.00–0.48)

## 2013-11-08 MED ORDER — ONDANSETRON HCL 4 MG/2ML IJ SOLN
4.0000 mg | Freq: Once | INTRAMUSCULAR | Status: AC
  Administered 2013-11-08: 4 mg via INTRAVENOUS
  Filled 2013-11-08: qty 2

## 2013-11-08 MED ORDER — CIPROFLOXACIN HCL 500 MG PO TABS: 500.0000 mg | ORAL_TABLET | Freq: Two times a day (BID) | ORAL | Status: DC

## 2013-11-08 MED ORDER — HYDROMORPHONE HCL PF 1 MG/ML IJ SOLN
0.5000 mg | Freq: Once | INTRAMUSCULAR | Status: AC
  Administered 2013-11-08: 0.5 mg via INTRAVENOUS
  Filled 2013-11-08: qty 1

## 2013-11-08 MED ORDER — SODIUM CHLORIDE 0.9 % IV BOLUS (SEPSIS)
250.0000 mL | Freq: Once | INTRAVENOUS | Status: AC
  Administered 2013-11-08: 250 mL via INTRAVENOUS

## 2013-11-08 MED ORDER — CIPROFLOXACIN IN D5W 400 MG/200ML IV SOLN
400.0000 mg | Freq: Once | INTRAVENOUS | Status: AC
  Administered 2013-11-08: 400 mg via INTRAVENOUS
  Filled 2013-11-08: qty 200

## 2013-11-08 MED ORDER — TRAMADOL HCL 50 MG PO TABS: 50.0000 mg | ORAL_TABLET | Freq: Four times a day (QID) | ORAL | Status: DC | PRN

## 2013-11-08 MED ORDER — SODIUM CHLORIDE 0.9 % IV SOLN
INTRAVENOUS | Status: DC
  Administered 2013-11-08: 20:00:00 via INTRAVENOUS

## 2013-11-08 NOTE — ED Notes (Signed)
Pt just got off work and started having dizziness, also states tingling to right arm since yesterday, mild SOB, denies CP

## 2013-11-08 NOTE — ED Notes (Signed)
Pt c/o numbness and tingling in right arm x "a few weeks". States he came in tonight due to "feeling strange and a little dizzy". Denies pain anywhere. Reports sensation is equal from right arm to left arm. A&O x 4. Nad noted at this time.

## 2013-11-08 NOTE — ED Notes (Signed)
MD at bedside. 

## 2013-11-08 NOTE — ED Provider Notes (Addendum)
CSN: [id number]     Arrival date & time 11/08/13  1848 History   First MD Initiated Contact with Patient 11/08/13 1856     Chief Complaint  Patient presents with  . Near Syncope  . Tingling   (Consider location/radiation/quality/duration/timing/severity/associated sxs/prior Treatment) The history is provided by the patient.   patient is a 57 year old male got off work today started having dizziness also tingling in his right arm and pain in his right elbow. Associated with some mild shortness of breath no chest pain slight headache vague visual changes patient's had tingling in the right arm for 2 weeks and pain in the elbow for about same period of time. Elbow pain is made worse by moving the elbow no injury. The dizziness is not consistent with vertigo the symptoms all started at 5:00 PM. Patient states that the subtle or vague visual changes and a mild headache are suggestive of his early migraine kind of pain but this is not a full-blown migraine. Patient states that the elbow pain is 8/10 at its worse but only really painful when he moves.  Past Medical History  Diagnosis Date  . Kidney calculi   . Migraine    Past Surgical History  Procedure Laterality Date  . Shoulder surgery    . Knee surgery    . Appendectomy    . Tonsillectomy    . Adenoidectomy    . Incision and drainage perirectal abscess N/A 04/20/2013    Procedure: SHARP SURGICAL DEBRIDEMENT PERINEAL INFECTION;  Surgeon: Fabio Bering, MD;  Location: [facility];  Service: General;  Laterality: N/A;  . Central venous catheter insertion Left 04/20/2013    Procedure: INSERTION CENTRAL LINE LEFT SUBCLAVIAN;  Surgeon: Fabio Bering, MD;  Location: [facility];  Service: General;  Laterality: Left;   History reviewed. No pertinent family history. History  Substance Use Topics  . Smoking status: Current Every Day Smoker    Types: Cigarettes  . Smokeless tobacco: Not on file  . Alcohol Use: No    Review of Systems   Constitutional: Negative for fever.  HENT: Negative for congestion.   Eyes: Positive for visual disturbance. Negative for redness.  Respiratory: Positive for shortness of breath. Negative for chest tightness.   Cardiovascular: Negative for chest pain.  Gastrointestinal: Negative for nausea, vomiting, abdominal pain and diarrhea.  Genitourinary: Negative for dysuria and hematuria.  Musculoskeletal: Negative for back pain.  Skin: Negative for rash.  Neurological: Positive for dizziness, numbness and headaches. Negative for weakness.  Hematological: Does not bruise/bleed easily.  Psychiatric/Behavioral: Negative for confusion.    Allergies  Review of patient's allergies indicates no known allergies.  Home Medications   Current Outpatient Rx  Name  Route  Sig  Dispense  Refill  . alfuzosin (UROXATRAL) 10 MG 24 hr tablet   Oral   Take 10 mg by mouth daily.         Marland Kitchen escitalopram (LEXAPRO) 10 MG tablet   Oral   Take 10 mg by mouth daily.         Marland Kitchen esomeprazole (NEXIUM) 40 MG capsule   Oral   Take 40 mg by mouth daily.         . naproxen (NAPROSYN) 500 MG tablet   Oral   Take 500 mg by mouth 2 (two) times daily.         . Oxycodone HCl 10 MG TABS   Oral   Take 10 mg by mouth 3 (three) times daily.         Marland Kitchen  pregabalin (LYRICA) 75 MG capsule   Oral   Take 75 mg by mouth 2 (two) times daily.         . rosuvastatin (CRESTOR) 10 MG tablet   Oral   Take 10 mg by mouth daily.         Marland Kitchen topiramate (TOPAMAX) 200 MG tablet   Oral   Take 200 mg by mouth daily.          . verapamil (CALAN-SR) 240 MG CR tablet   Oral   Take 240 mg by mouth daily.          . ciprofloxacin (CIPRO) 500 MG tablet   Oral   Take 1 tablet (500 mg total) by mouth 2 (two) times daily.   20 tablet   0   . traMADol (ULTRAM) 50 MG tablet   Oral   Take 1 tablet (50 mg total) by mouth every 6 (six) hours as needed.   20 tablet   0    BP 124/64  Pulse 88  Temp(Src) 97.6 F  (36.4 C) (Oral)  Resp 20  Ht 5\' 5"  (1.651 m)  Wt 216 lb (97.977 kg)  BMI 35.94 kg/m2  SpO2 100% Physical Exam  Nursing note and vitals reviewed. Constitutional: He is oriented to person, place, and time. He appears well-developed and well-nourished. No distress.  HENT:  Head: Normocephalic and atraumatic.  Mouth/Throat: Oropharynx is clear and moist.  Eyes: Conjunctivae and EOM are normal. Pupils are equal, round, and reactive to light.  Neck: Normal range of motion. Neck supple.  Cardiovascular: Normal rate, regular rhythm and normal heart sounds.   No murmur heard. Pulmonary/Chest: Effort normal and breath sounds normal. No respiratory distress.  Abdominal: Soft. Bowel sounds are normal. There is no tenderness.  Neurological: He is alert and oriented to person, place, and time. He has normal reflexes. No cranial nerve deficit. He exhibits normal muscle tone. Coordination normal.  Skin: No rash noted.    ED Course  Procedures (including critical care time) Labs Review Labs Reviewed  COMPREHENSIVE METABOLIC PANEL - Abnormal; Notable for the following:    Sodium 133 (*)    Total Bilirubin 0.2 (*)    GFR calc non Af Amer 64 (*)    GFR calc Af Amer 74 (*)    All other components within normal limits  URINALYSIS, ROUTINE W REFLEX MICROSCOPIC - Abnormal; Notable for the following:    Color, Urine AMBER (*)    APPearance CLOUDY (*)    Hgb urine dipstick LARGE (*)    All other components within normal limits  CBC WITH DIFFERENTIAL - Abnormal; Notable for the following:    RBC 4.12 (*)    Hemoglobin 11.6 (*)    HCT 34.8 (*)    RDW 16.6 (*)    All other components within normal limits  D-DIMER, QUANTITATIVE  TROPONIN I  LIPASE, BLOOD  PRO B NATRIURETIC PEPTIDE  URINE MICROSCOPIC-ADD ON   Results for orders placed during the hospital encounter of 11/08/13  COMPREHENSIVE METABOLIC PANEL      Result Value Range   Sodium 133 (*) 135 - 145 mEq/L   Potassium 3.6  3.5 - 5.1  mEq/L   Chloride 100  96 - 112 mEq/L   CO2 26  19 - 32 mEq/L   Glucose, Bld 83  70 - 99 mg/dL   BUN 23  6 - 23 mg/dL   Creatinine, Ser 1.47  0.50 - 1.35 mg/dL   Calcium 8.8  8.4 - 10.5 mg/dL   Total Protein 7.0  6.0 - 8.3 g/dL   Albumin 4.1  3.5 - 5.2 g/dL   AST 16  0 - 37 U/L   ALT 13  0 - 53 U/L   Alkaline Phosphatase 77  39 - 117 U/L   Total Bilirubin 0.2 (*) 0.3 - 1.2 mg/dL   GFR calc non Af Amer 64 (*) >90 mL/min   GFR calc Af Amer 74 (*) >90 mL/min  D-DIMER, QUANTITATIVE      Result Value Range   D-Dimer, Quant <0.27  0.00 - 0.48 ug/mL-FEU  TROPONIN I      Result Value Range   Troponin I <0.30  <0.30 ng/mL  URINALYSIS, ROUTINE W REFLEX MICROSCOPIC      Result Value Range   Color, Urine AMBER (*) YELLOW   APPearance CLOUDY (*) CLEAR   Specific Gravity, Urine 1.025  1.005 - 1.030   pH 6.0  5.0 - 8.0   Glucose, UA NEGATIVE  NEGATIVE mg/dL   Hgb urine dipstick LARGE (*) NEGATIVE   Bilirubin Urine NEGATIVE  NEGATIVE   Ketones, ur NEGATIVE  NEGATIVE mg/dL   Protein, ur NEGATIVE  NEGATIVE mg/dL   Urobilinogen, UA 0.2  0.0 - 1.0 mg/dL   Nitrite NEGATIVE  NEGATIVE   Leukocytes, UA NEGATIVE  NEGATIVE  LIPASE, BLOOD      Result Value Range   Lipase 23  11 - 59 U/L  CBC WITH DIFFERENTIAL      Result Value Range   WBC 6.6  4.0 - 10.5 K/uL   RBC 4.12 (*) 4.22 - 5.81 MIL/uL   Hemoglobin 11.6 (*) 13.0 - 17.0 g/dL   HCT 16.1 (*) 09.6 - 04.5 %   MCV 84.5  78.0 - 100.0 fL   MCH 28.2  26.0 - 34.0 pg   MCHC 33.3  30.0 - 36.0 g/dL   RDW 40.9 (*) 81.1 - 91.4 %   Platelets 166  150 - 400 K/uL   Neutrophils Relative % 63  43 - 77 %   Neutro Abs 4.1  1.7 - 7.7 K/uL   Lymphocytes Relative 24  12 - 46 %   Lymphs Abs 1.6  0.7 - 4.0 K/uL   Monocytes Relative 10  3 - 12 %   Monocytes Absolute 0.7  0.1 - 1.0 K/uL   Eosinophils Relative 3  0 - 5 %   Eosinophils Absolute 0.2  0.0 - 0.7 K/uL   Basophils Relative 0  0 - 1 %   Basophils Absolute 0.0  0.0 - 0.1 K/uL  PRO B NATRIURETIC  PEPTIDE      Result Value Range   Pro B Natriuretic peptide (BNP) 64.2  0 - 125 pg/mL  URINE MICROSCOPIC-ADD ON      Result Value Range   RBC / HPF TOO NUMEROUS TO COUNT  <3 RBC/hpf    Imaging Review Dg Chest 2 View  11/08/2013   CLINICAL DATA:  Near syncope.  EXAM: CHEST  2 VIEW  COMPARISON:  04/30/2013  FINDINGS: Heart and mediastinal contours are within normal limits. No focal opacities or effusions. No acute bony abnormality. Prior resection of the distal right clavicle. Old healed left rib fractures.  IMPRESSION: No active cardiopulmonary disease.   Electronically Signed   By: Charlett Nose M.D.   On: 11/08/2013 20:42   Dg Elbow Complete Right  11/08/2013   CLINICAL DATA:  Near syncope.  Right arm tingling.  EXAM: RIGHT ELBOW - COMPLETE  3+ VIEW  COMPARISON:  None.  FINDINGS: There is no evidence of fracture, dislocation, or joint effusion. There is no evidence of arthropathy or other focal bone abnormality. Soft tissues are unremarkable.  IMPRESSION: Negative.   Electronically Signed   By: Charlett Nose M.D.   On: 11/08/2013 20:42   Ct Head Wo Contrast  11/08/2013   CLINICAL DATA:  Dizzy, tingling in right arm for 1 day  EXAM: CT HEAD WITHOUT CONTRAST  TECHNIQUE: Contiguous axial images were obtained from the base of the skull through the vertex without intravenous contrast.  COMPARISON:  None.  FINDINGS: No mass lesion. No midline shift. No acute hemorrhage or hematoma. No extra-axial fluid collections. No evidence of acute infarction. No significant paranasal sinus inflammatory change.  IMPRESSION: Negative   Electronically Signed   By: Esperanza Heir M.D.   On: 11/08/2013 20:21    EKG Interpretation    Date/Time:  Saturday November 08 2013 18:54:38 EST Ventricular Rate:  77 PR Interval:  182 QRS Duration: 90 QT Interval:  374 QTC Calculation: 423 R Axis:   24 Text Interpretation:  Normal sinus rhythm Normal ECG When compared with ECG of 30-Apr-2013 07:55, Criteria for Inferior  infarct are no longer Present Confirmed by Lerin Jech  MD, Brett Darko ([id number]) on 11/08/2013 7:12:49 PM            MDM   1. Hematuria   2. Dizziness   3. Elbow pain, right    Workup without specific findings other than the hematuria. Patient states she's had hematuria in the past when these had the renal stones but clinically no pain consistent with renal colic. Concern would be for prostate infection or perhaps bladder infection even though this is somewhat straight hematuria. Will treat with Cipro as if it could be a prostate infection or urinary tract infection and then we'll continue Cipro at home for the next 10 days. While patient followup with his primary care doctor in the next several days to make sure that the blood and urine clears.  Head CT was negative chest x-ray was negative EKG has no acute findings. Troponin was negative d-dimer was negative no evidence of a stroke to explain the tingling in the arm for the past 2 weeks. Also no evidence of pulmonary embolism based on d-dimer and chest x-ray. Chest x-ray showed no pneumonia pneumothorax or pulmonary edema. Patient without leukocytosis no anemia normal renal function. Followup with primary care Dr. will be important.  If hematuria does not improve CT scan her urologic workup will be required. Do not do CT scan tonight since he has no pain.  Patient's dizziness improved in ED no true vertigo.    Shelda Jakes, MD 11/08/13 2110  Shelda Jakes, MD 11/08/13 2115

## 2014-04-22 IMAGING — CR DG CHEST 1V PORT
1 series · 1 of 1 positions shown · non-contrast
Comparison: Film earlier this date

CLINICAL DATA: PICC line placement.

PORTABLE CHEST - 1 VIEW

[view not recorded]
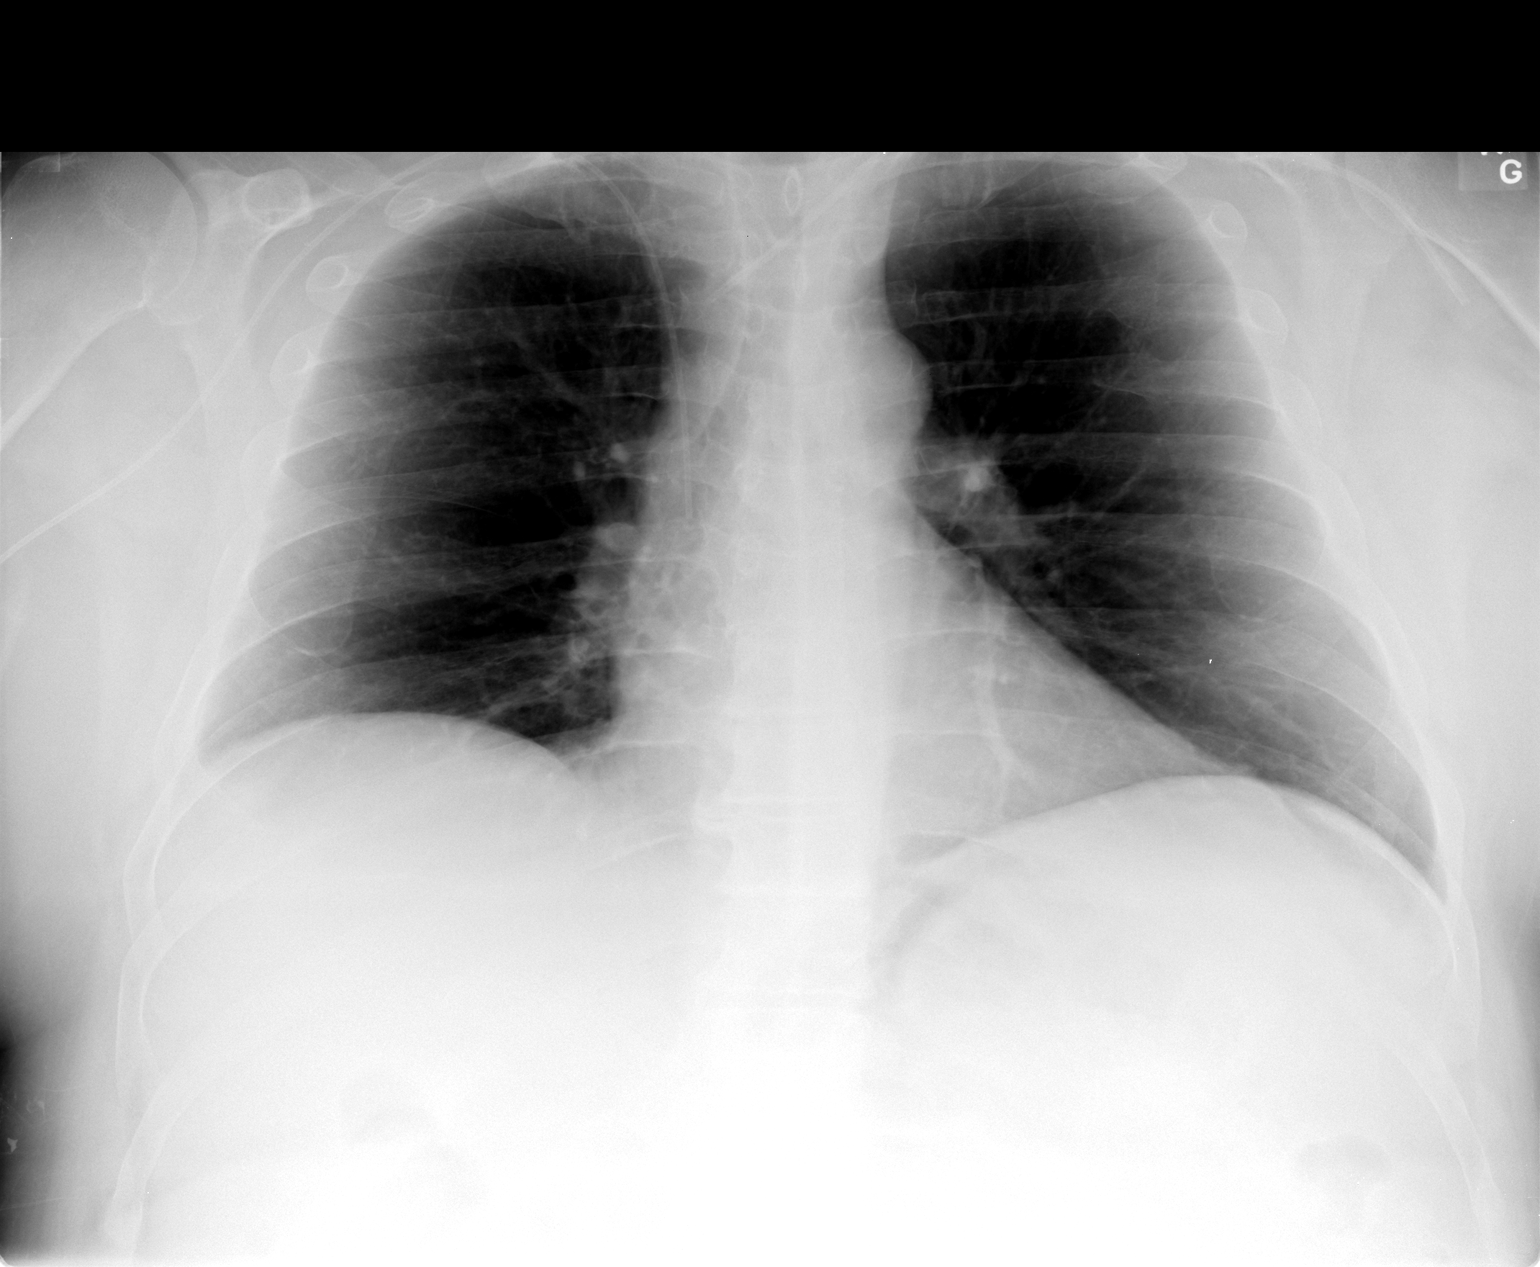

[1 of 1 positions shown; findings below may reference images not displayed]

FINDINGS: A right PICC line is identified with tip overlying the
lower SVC.
A left subclavian central venous catheter is present with tip
overlying the brachiocephalic - SVC junction.
The cardiomediastinal silhouette is unremarkable.
There is no evidence of focal airspace disease, pulmonary edema,
suspicious pulmonary nodule/mass, pleural effusion, or
pneumothorax.
No acute bony abnormalities are identified.
IMPRESSION: Right PICC line with tip overlying the lower SVC.

Left subclavian central venous catheter with tip overlying the
brachiocephalic - SVC junction.

No evidence of acute cardiopulmonary disease.

## 2014-05-01 IMAGING — CR DG ABD PORTABLE 1V
1 series · 1 of 1 positions shown · non-contrast
Comparison: [facility] image for CT scan dated 04/20/2013

CLINICAL DATA: Abdominal distention.

PORTABLE ABDOMEN - 1 VIEW

[AP]
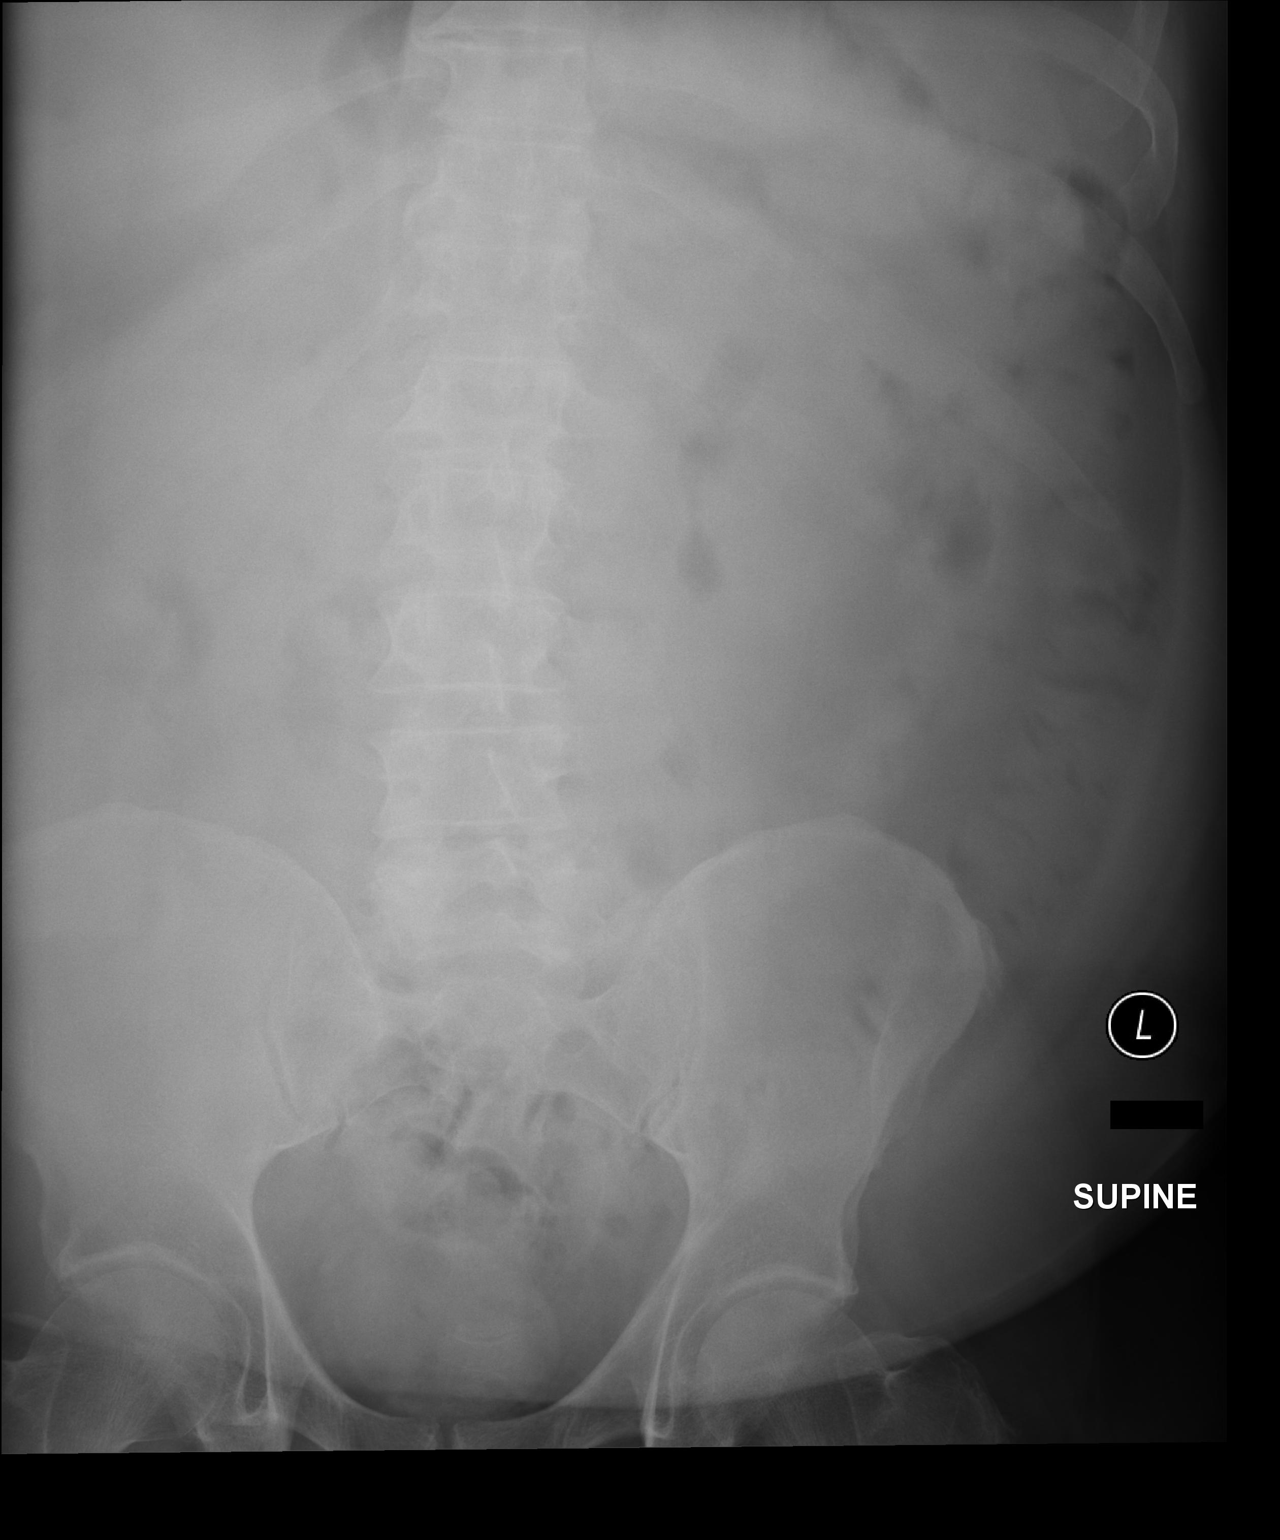

[1 of 1 positions shown; findings below may reference images not displayed]

FINDINGS: The image is degraded by motion artifact.  There is only
a small amount of air scattered throughout the nondistended large
and small bowel.  No visible free air or free fluid.  No osseous
abnormality.
IMPRESSION: Benign-appearing abdomen.

## 2014-06-04 IMAGING — CT CT ABD-PELV W/ CM
2 of 4 series · 16 of 46 positions shown, 18 images · IV contrast (omnipaque)
Comparison: 04/20/2012.

CLINICAL DATA: Pilonidal cyst.

CT ABDOMEN AND PELVIS WITH CONTRAST
TECHNIQUE: Multidetector CT imaging of the abdomen and pelvis was
performed following the standard protocol during bolus
administration of intravenous contrast.
Contrast: 50mL OMNIPAQUE IOHEXOL 300 MG/ML  SOLN, 100mL OMNIPAQUE
IOHEXOL 300 MG/ML  SOLN

[Series 2: pel delay 5.0 b40f · axial · delayed · 0.71mm/px · z∈[-181,+14]mm · 13 of 45 slices shown, 15 images]
[im 3/45  soft-tissue]
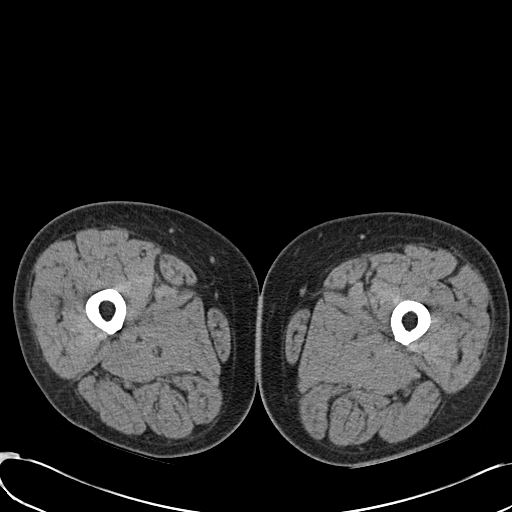
[im 3/45  bone]
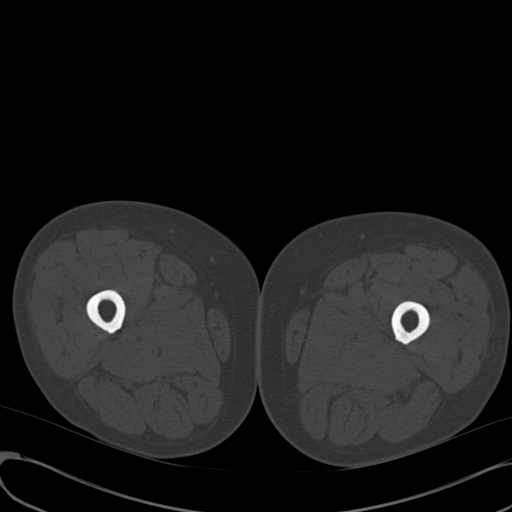
[im 6/45  soft-tissue]
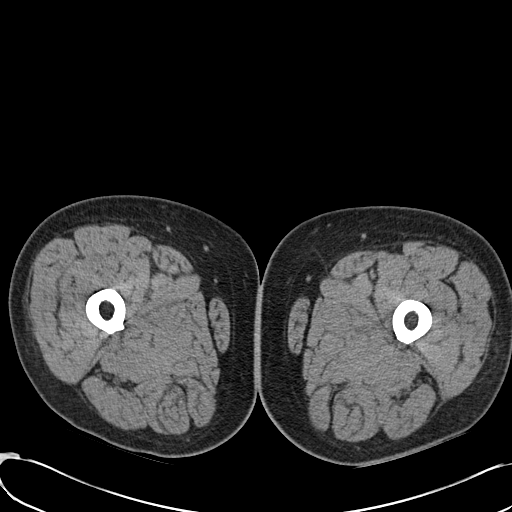
[im 9/45  soft-tissue]
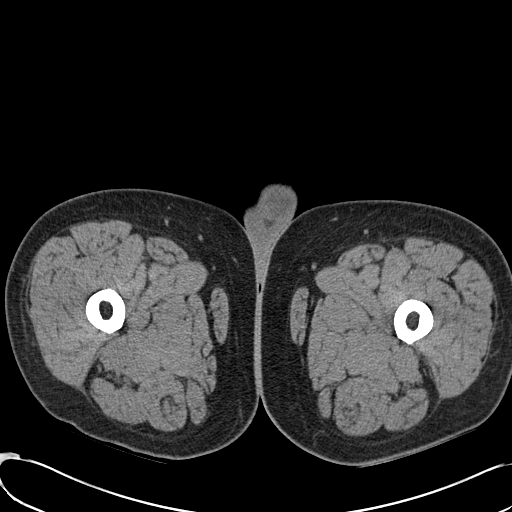
[im 12/45  soft-tissue]
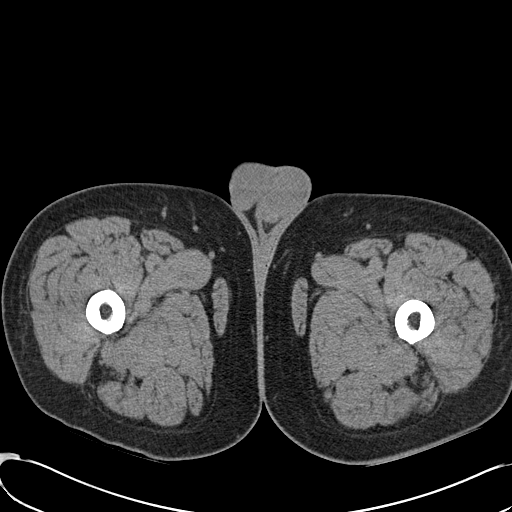
[im 15/45  soft-tissue]
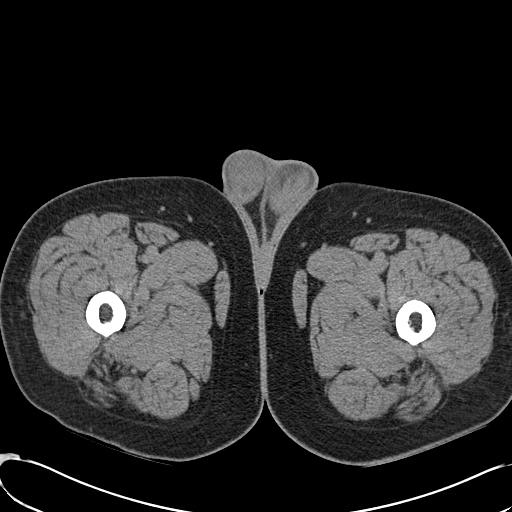
[im 18/45  soft-tissue]
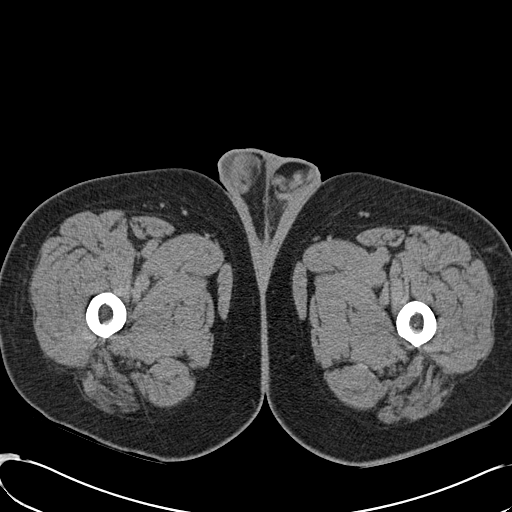
[im 24/45  soft-tissue]
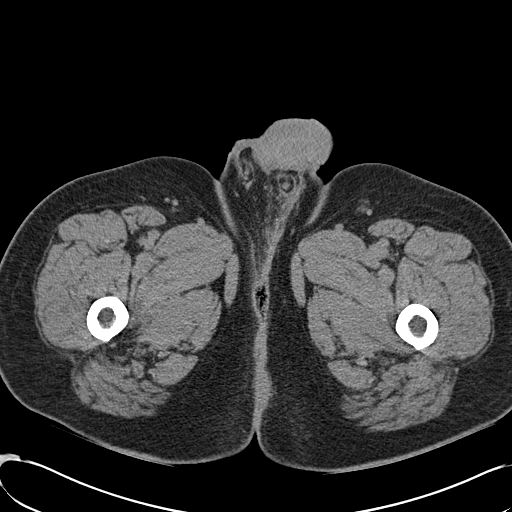
[im 27/45  soft-tissue]
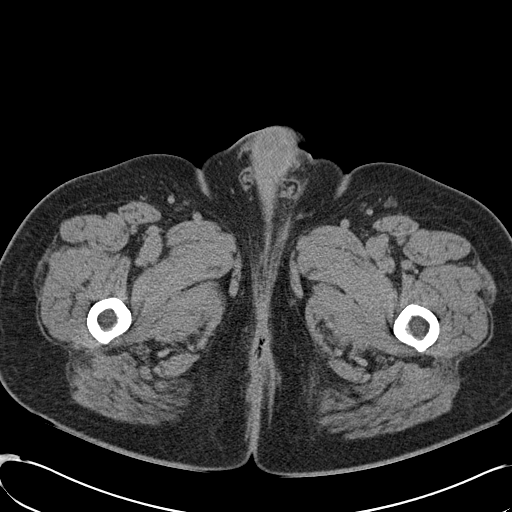
[im 30/45  soft-tissue]
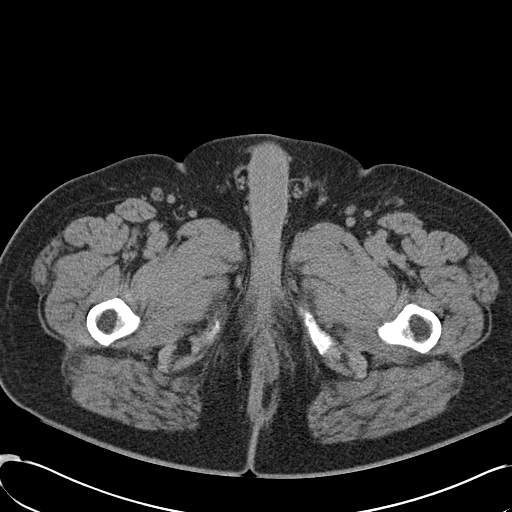
[im 30/45  bone]
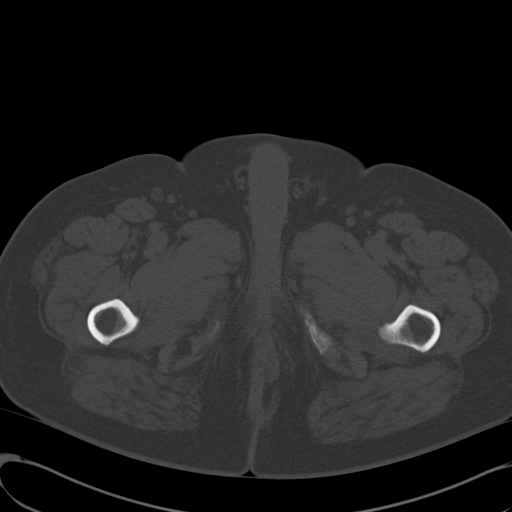
[im 33/45  soft-tissue]
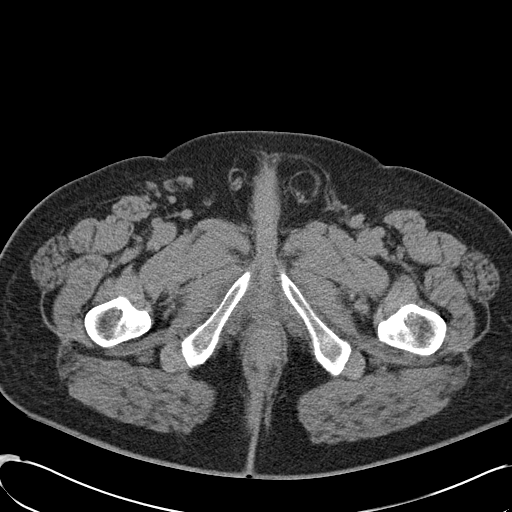
[im 36/45  soft-tissue]
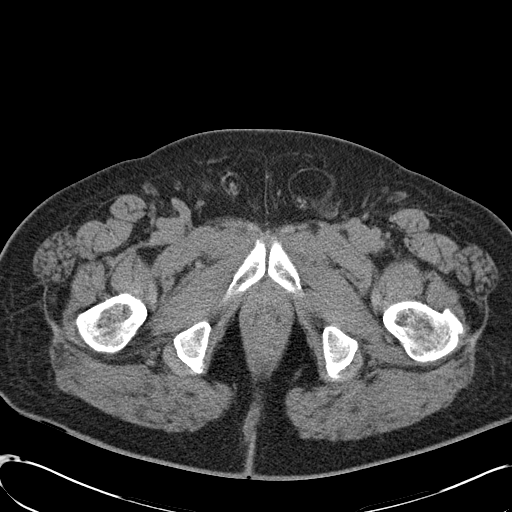
[im 39/45  soft-tissue]
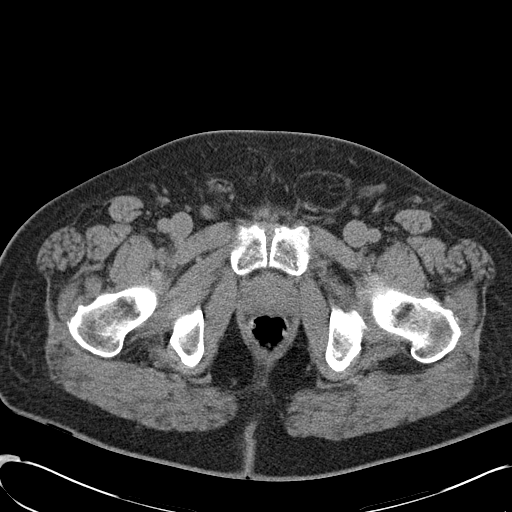
[im 42/45  soft-tissue]
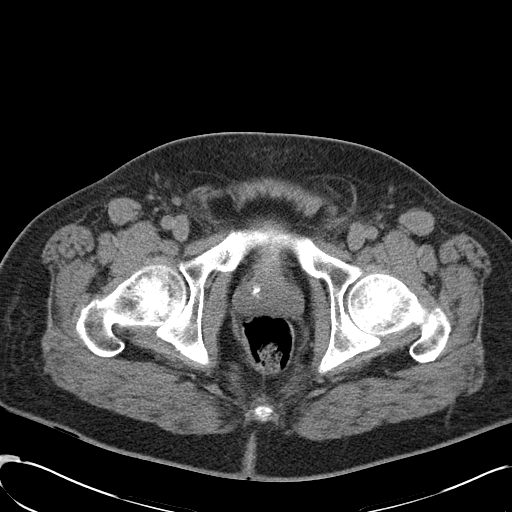

[Series 4: pel delay 3.0 spo · coronal · delayed · 0.45mm/px · 3 of 85 slices shown]
[im 29/85  soft-tissue]
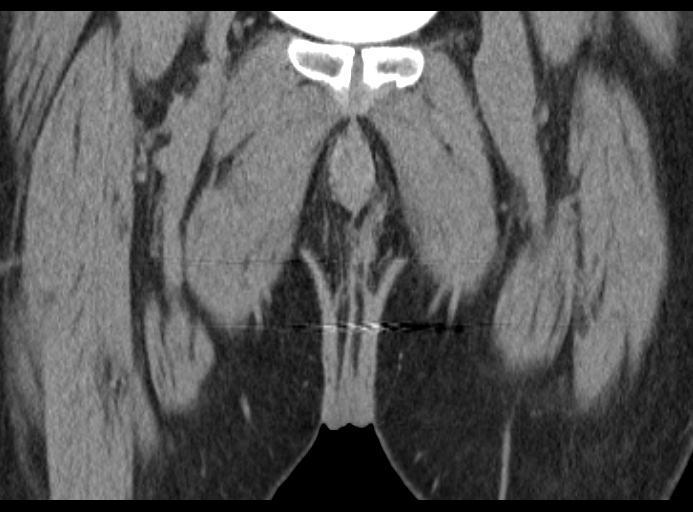
[im 38/85  soft-tissue]
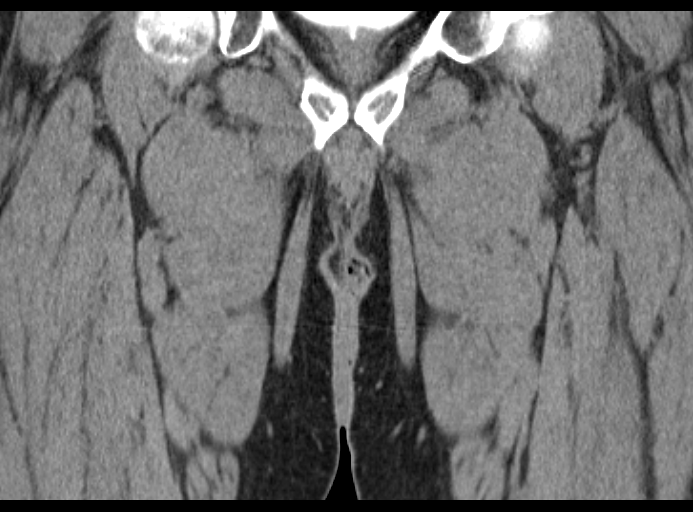
[im 47/85  soft-tissue]
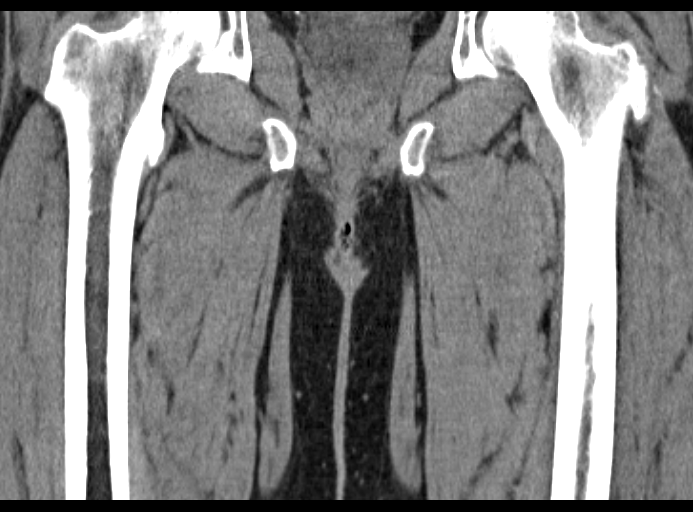

[16 of 46 positions shown; findings below may reference images not displayed]

FINDINGS: The lung bases are clear.  No pleural effusion.

The solid abdominal organs are normal and stable.  The gallbladder
is normal.  No common bile duct dilatation. There is a 4 mm left
upper ureteral calculus.

The stomach, duodenum, small bowel and colon are unremarkable.  No
acute inflammatory changes or mass lesions.  Moderate
diverticulosis of the colon.  No mesenteric or retroperitoneal mass
or adenopathy.  The aorta is normal in caliber.  The major branch
vessels are patent.

The bladder, prostate gland and seminal vesicles are unremarkable.
No pelvic mass, adenopathy or free pelvic fluid collections.  No
inguinal mass or adenopathy.  Stable prominent fat in the left
inguinal canal.

There is a recurrent peroneal infectious process with inflammation
and air in the subcutaneous tissues.  No discrete drainable abscess
is identified.  Findings could be due to a perianal fistula.

The bony structures are unremarkable.
IMPRESSION: 1.  No acute intra-abdominal/pelvic findings.
2.  4 mm nonobstructing left upper ureteral calculus.
3.  Recurrent peroneal infectious process likely due to a perianal
fistula.  No discrete drainable abscess.  MRI without and with
contrast is probably the best test for further evaluation of a
perianal fistula.

## 2014-06-24 ENCOUNTER — Other Ambulatory Visit (HOSPITAL_COMMUNITY): Payer: Self-pay

## 2014-06-24 DIAGNOSIS — G4730 Sleep apnea, unspecified: Secondary | ICD-10-CM

## 2014-06-30 ENCOUNTER — Ambulatory Visit: Attending: Neurology | Admitting: Sleep Medicine

## 2014-06-30 VITALS — Ht 66.0 in | Wt 225.0 lb

## 2014-06-30 DIAGNOSIS — R5381 Other malaise: Secondary | ICD-10-CM | POA: Insufficient documentation

## 2014-06-30 DIAGNOSIS — G4730 Sleep apnea, unspecified: Secondary | ICD-10-CM

## 2014-06-30 DIAGNOSIS — G4731 Primary central sleep apnea: Secondary | ICD-10-CM | POA: Insufficient documentation

## 2014-06-30 DIAGNOSIS — R5383 Other fatigue: Secondary | ICD-10-CM

## 2014-07-09 NOTE — Sleep Study (Signed)
  [place] A. Merlene Laughter, MD     [email or web address]        NOCTURNAL POLYSOMNOGRAM    LOCATION: SLEEP LAB FACILITY: [facility]   PHYSICIAN: Santos Sollenberger A. Merlene Laughter, M.D.   DATE OF STUDY: 06/30/2014.   REFERRING PHYSICIAN: Lashanta Elbe.  INDICATIONS: This is a 58 year old who presents with witnessed apnea, snoring and fatigue.  MEDICATIONS:  Prior to Admission medications   Medication Sig Start Date End Date Taking? Authorizing Provider  alfuzosin (UROXATRAL) 10 MG 24 hr tablet Take 10 mg by mouth daily.    Historical Provider, MD  ciprofloxacin (CIPRO) 500 MG tablet Take 1 tablet (500 mg total) by mouth 2 (two) times daily. 11/08/13   Fredia Sorrow, MD  escitalopram (LEXAPRO) 10 MG tablet Take 10 mg by mouth daily.    Historical Provider, MD  esomeprazole (NEXIUM) 40 MG capsule Take 40 mg by mouth daily.    Historical Provider, MD  naproxen (NAPROSYN) 500 MG tablet Take 500 mg by mouth 2 (two) times daily.    Historical Provider, MD  Oxycodone HCl 10 MG TABS Take 10 mg by mouth 3 (three) times daily.    Historical Provider, MD  pregabalin (LYRICA) 75 MG capsule Take 75 mg by mouth 2 (two) times daily.    Historical Provider, MD  rosuvastatin (CRESTOR) 10 MG tablet Take 10 mg by mouth daily.    Historical Provider, MD  topiramate (TOPAMAX) 200 MG tablet Take 200 mg by mouth daily.     Historical Provider, MD  traMADol (ULTRAM) 50 MG tablet Take 1 tablet (50 mg total) by mouth every 6 (six) hours as needed. 11/08/13   Fredia Sorrow, MD  verapamil (CALAN-SR) 240 MG CR tablet Take 240 mg by mouth daily.     Historical Provider, MD      EPWORTH SLEEPINESS SCALE: 11.   BMI: 36.   ARCHITECTURAL SUMMARY: Total recording time was 408 minutes. Sleep efficiency 48 %. Sleep latency 123 minutes. REM latency 65 minutes. Stage NI 16 %, N2 61 % and N3 0 % and REM sleep 22 %.    RESPIRATORY DATA:  Baseline oxygen saturation is 95%. The lowest saturation is 83 %. The  diagnostic AHI is 42. The RDI is 42. The REM AHI is 45. The events were almost exclusively central in nature with at least 52 being central and 20 being obstructive. The patient could not be titrated because of the night.    LIMB MOVEMENT SUMMARY: PLM index 0.   ELECTROCARDIOGRAM SUMMARY: Average heart rate is 69 with no significant dysrhythmias observed.   IMPRESSION:  1. Severe Central sleep apnea syndrome. A formal positive pressure titration is suggested.  Thanks for this referral.  Lummie Montijo A. Merlene Laughter, M.D. Diplomat, Tax adviser of Sleep Medicine.

## 2014-08-11 ENCOUNTER — Other Ambulatory Visit: Payer: Self-pay

## 2014-08-11 DIAGNOSIS — G4730 Sleep apnea, unspecified: Secondary | ICD-10-CM

## 2014-08-19 ENCOUNTER — Ambulatory Visit: Attending: Neurology | Admitting: Sleep Medicine

## 2014-08-19 DIAGNOSIS — G4733 Obstructive sleep apnea (adult) (pediatric): Secondary | ICD-10-CM | POA: Diagnosis not present

## 2014-08-19 DIAGNOSIS — G4730 Sleep apnea, unspecified: Secondary | ICD-10-CM

## 2014-08-25 NOTE — Procedures (Signed)
[place] A. Merlene Laughter, MD     [email or web address]        NAME:  Shawn Fleming, Shawn Fleming                ACCOUNT NO.:  [id number]  MEDICAL RECORD NO.:  [id number]          PATIENT TYPE:  OUT  LOCATION:  SLEEP LAB                     FACILITY:  [facility]  PHYSICIAN:  Mishael Krysiak A. Merlene Laughter, M.D. DATE OF BIRTH:  01-17-1956  DATE OF STUDY:  08/19/2014                           NOCTURNAL POLYSOMNOGRAM  REFERRING PHYSICIAN:  Ramonica Grigg A. Merlene Laughter, M.D.  INDICATION:  A 58 year old man who presents with no history of obstructive sleep apnea syndrome.  Epworth sleepiness scale 12.  BMI 36.  ARCHITECTURAL SUMMARY: This is a CPAP titration recording. Total recording time is 419 minutes, sleep efficiency 86%, sleep latency 28 minutes, REM latency 117 minutes. Stage N1 3%, N2 58%, N3 20%, and REM sleep 19%.  RESPIRATORY SUMMARY:  Baseline oxygen saturation is 95, lowest saturation 86.  The patient is placed on positive pressure starting at 5 and increased to 6.  Optimal pressure 6 with resolution of obstructive events and good tolerance.  LIMB MOVEMENT SUMMARY:  PLM index 0.  ELECTROCARDIOGRAM SUMMARY:  Average heart rate is 71 with no significant dysrhythmias observed.  IMPRESSION:  Obstructive sleep apnea syndrome which responds well to a CPAP of 6.  Thanks for this referral.     Brooklynn Brandenburg A. Merlene Laughter, M.D.    KAD/MEDQ  D:  08/25/2014 10:07:12  T:  08/25/2014 19:75:88  Job:  [id number]

## 2014-11-05 IMAGING — CR DG CHEST 2V
2 series · 2 of 2 positions shown · non-contrast
Comparison: 04/30/2013

CLINICAL DATA: Near syncope.

EXAM:
CHEST  2 VIEW

[view not recorded (1 of 2)]
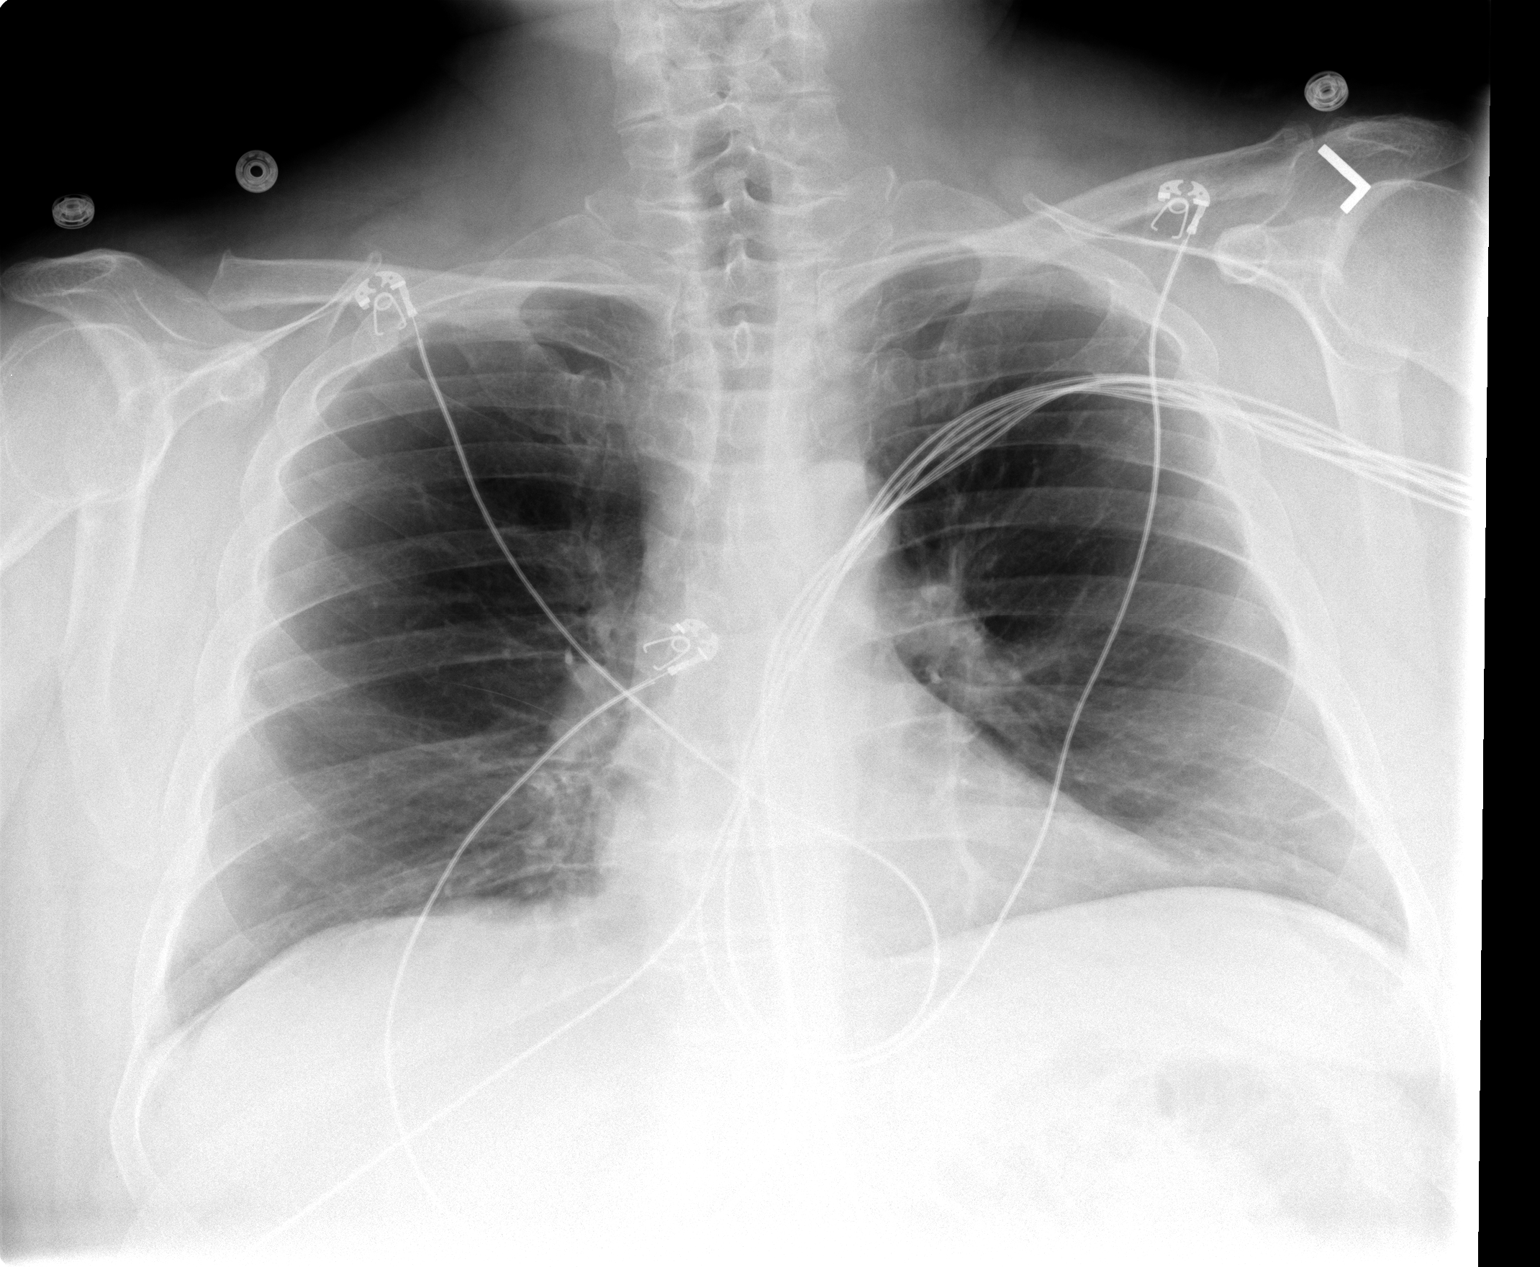

[view not recorded (2 of 2)]
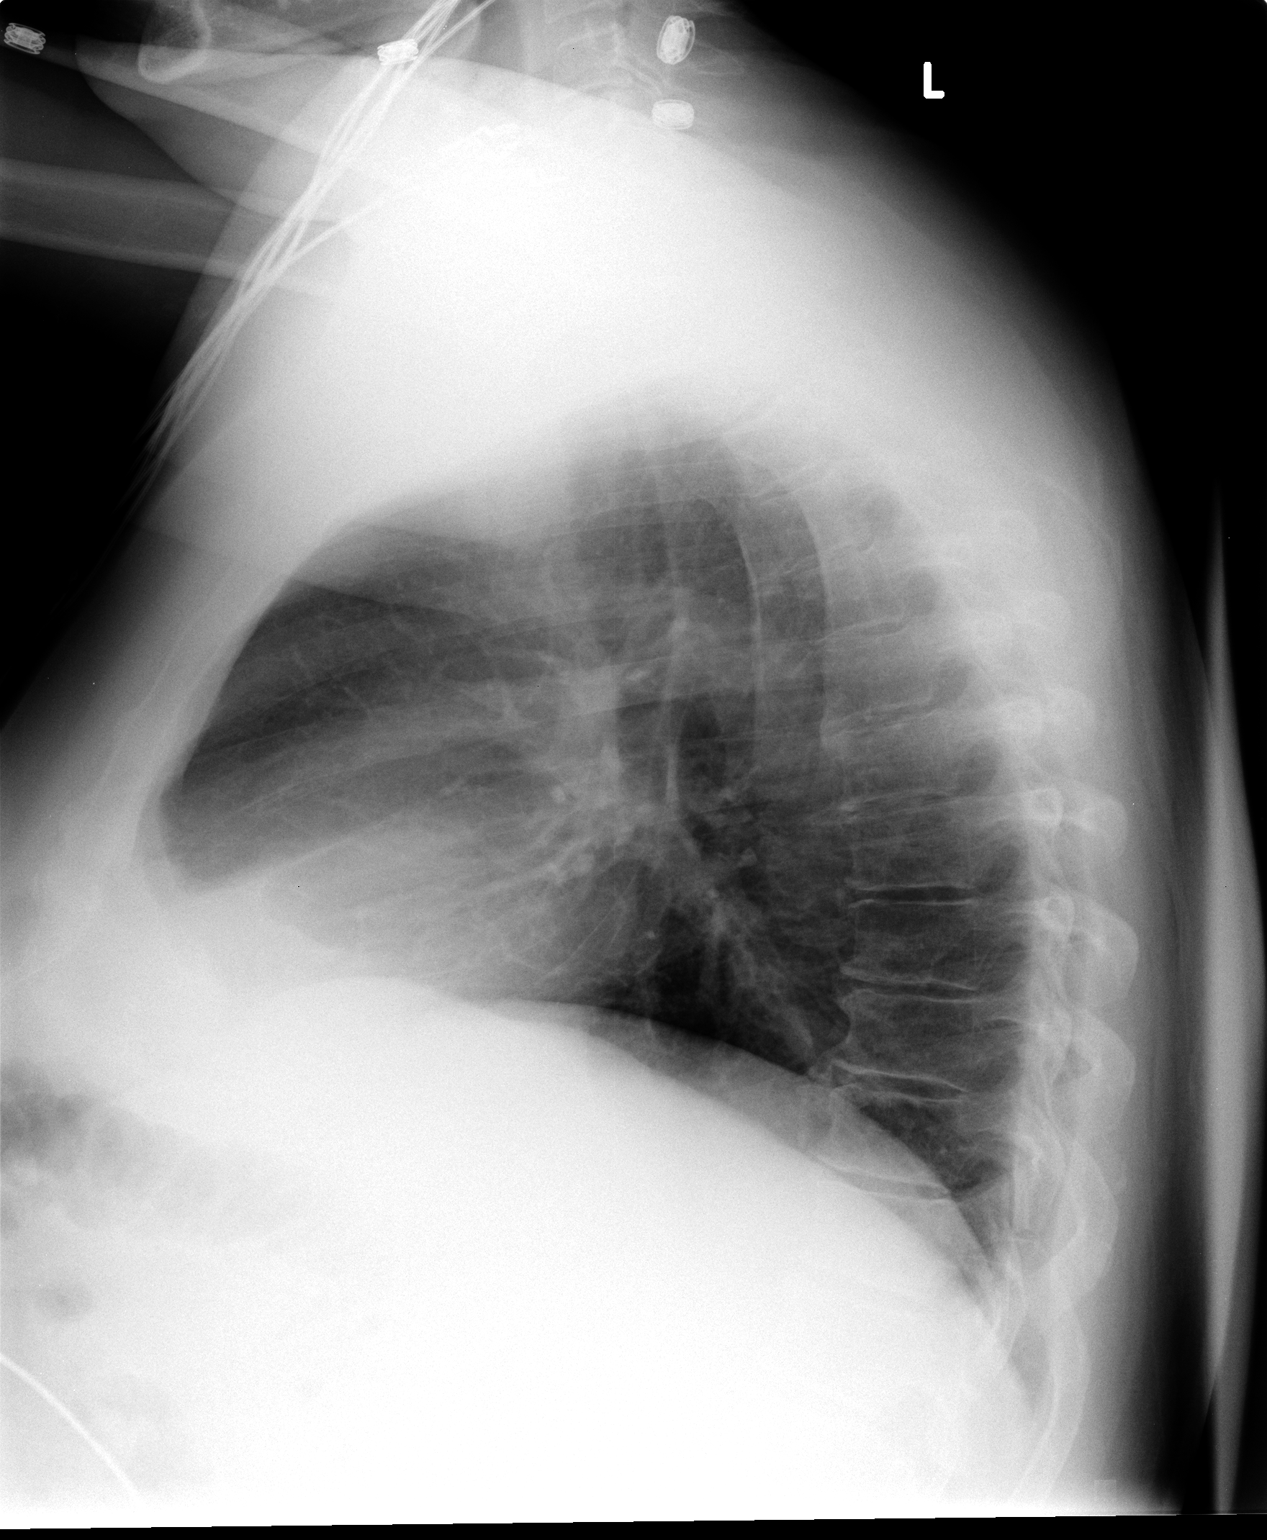

[2 of 2 positions shown; findings below may reference images not displayed]

FINDINGS: Heart and mediastinal contours are within normal limits. No focal
opacities or effusions. No acute bony abnormality. Prior resection
of the distal right clavicle. Old healed left rib fractures.
IMPRESSION: No active cardiopulmonary disease.

## 2016-05-02 ENCOUNTER — Encounter (INDEPENDENT_AMBULATORY_CARE_PROVIDER_SITE_OTHER): Payer: Self-pay | Admitting: *Deleted

## 2016-06-21 ENCOUNTER — Encounter (INDEPENDENT_AMBULATORY_CARE_PROVIDER_SITE_OTHER): Payer: Self-pay | Admitting: *Deleted

## 2016-06-21 ENCOUNTER — Other Ambulatory Visit (INDEPENDENT_AMBULATORY_CARE_PROVIDER_SITE_OTHER): Payer: Self-pay | Admitting: *Deleted

## 2016-06-21 ENCOUNTER — Telehealth (INDEPENDENT_AMBULATORY_CARE_PROVIDER_SITE_OTHER): Payer: Self-pay | Admitting: *Deleted

## 2016-06-21 DIAGNOSIS — Z1211 Encounter for screening for malignant neoplasm of colon: Secondary | ICD-10-CM

## 2016-06-21 NOTE — Telephone Encounter (Signed)
Patient needs trilyte 

## 2016-06-21 NOTE — Telephone Encounter (Addendum)
Referring MD/PCP: dondiego   Procedure: tcs with propofol  Reason/Indication:  screeniing  Has patient had this procedure before?  Yes, 10 yrs ago  If so, when, by whom and where?    Is there a family history of colon cancer?  no  Who?  What age when diagnosed?    Is patient diabetic?   no      Does patient have prosthetic heart valve or mechanical valve?  no  Do you have a pacemaker?  no  Has patient ever had endocarditis? no  Has patient had joint replacement within last 12 months?  no  Does patient tend to be constipated or take laxatives? some  Does patient have a history of alcohol/drug use?  no  Is patient on Coumadin, Plavix and/or Aspirin? no  Medications: see epic  Allergies: nkda  Medication Adjustment:   Procedure date & time: 08/04/16 at 730 Pre-op 8/14 at 900

## 2016-06-22 MED ORDER — PEG 3350-KCL-NA BICARB-NACL 420 G PO SOLR: 4000.0000 mL | Freq: Once | ORAL | Status: DC

## 2016-06-22 NOTE — Telephone Encounter (Signed)
agree

## 2016-07-18 ENCOUNTER — Other Ambulatory Visit (HOSPITAL_COMMUNITY)

## 2016-07-28 NOTE — Patient Instructions (Signed)
Shawn Fleming  07/28/2016     @PREFPERIOPPHARMACY @   Your procedure is scheduled on  08/04/2016   Report to [facility] at  615  A.M.  Call this number if you have problems the morning of surgery:  [phone]   Remember:  Do not eat food or drink liquids after midnight.  Take these medicines the morning of surgery with A SIP OF WATER  Lexapro, nexium, oxycodone, lyrica, topamax, ultram, verapamil.   Do not wear jewelry, make-up or nail polish.  Do not wear lotions, powders, or perfumes.  You may wear deoderant.  Do not shave 48 hours prior to surgery.  Men may shave face and neck.  Do not bring valuables to the hospital.  [facility] is not responsible for any belongings or valuables.  Contacts, dentures or bridgework may not be worn into surgery.  Leave your suitcase in the car.  After surgery it may be brought to your room.  For patients admitted to the hospital, discharge time will be determined by your treatment team.  Patients discharged the day of surgery will not be allowed to drive home.   Name and phone number of your driver:   family Special instructions:  Follow the diet and prep instructions given to you by Dr Olevia Perches office.  Please read over the following fact sheets that you were given. Anesthesia Post-op Instructions and Care and Recovery After Surgery       Colonoscopy A colonoscopy is an exam to look at the entire large intestine (colon). This exam can help find problems such as tumors, polyps, inflammation, and areas of bleeding. The exam takes about 1 hour.  LET [facility] CARE PROVIDER KNOW ABOUT:   Any allergies you have.  All medicines you are taking, including vitamins, herbs, eye drops, creams, and over-the-counter medicines.  Previous problems you or members of your family have had with the use of anesthetics.  Any blood disorders you have.  Previous surgeries you have had.  Medical conditions you have. RISKS AND  COMPLICATIONS  Generally, this is a safe procedure. However, as with any procedure, complications can occur. Possible complications include:  Bleeding.  Tearing or rupture of the colon wall.  Reaction to medicines given during the exam.  Infection (rare). BEFORE THE PROCEDURE   Ask your health care provider about changing or stopping your regular medicines.  You may be prescribed an oral bowel prep. This involves drinking a large amount of medicated liquid, starting the day before your procedure. The liquid will cause you to have multiple loose stools until your stool is almost clear or light green. This cleans out your colon in preparation for the procedure.  Do not eat or drink anything else once you have started the bowel prep, unless your health care provider tells you it is safe to do so.  Arrange for someone to drive you home after the procedure. PROCEDURE   You will be given medicine to help you relax (sedative).  You will lie on your side with your knees bent.  A long, flexible tube with a light and camera on the end (colonoscope) will be inserted through the rectum and into the colon. The camera sends video back to a computer screen as it moves through the colon. The colonoscope also releases carbon dioxide gas to inflate the colon. This helps your health care provider see the area better.  During the exam, your health care provider  may take a small tissue sample (biopsy) to be examined under a microscope if any abnormalities are found.  The exam is finished when the entire colon has been viewed. AFTER THE PROCEDURE   Do not drive for 24 hours after the exam.  You may have a small amount of blood in your stool.  You may pass moderate amounts of gas and have mild abdominal cramping or bloating. This is caused by the gas used to inflate your colon during the exam.  Ask when your test results will be ready and how you will get your results. Make sure you get your test  results.   This information is not intended to replace advice given to you by your health care provider. Make sure you discuss any questions you have with your health care provider.   Document Released: 12/01/2000 Document Revised: 09/24/2013 Document Reviewed: 08/11/2013 Elsevier Interactive Patient Education 2016 [employer] Colonoscopy, Care After Refer to this sheet in the next few weeks. These instructions provide you with information on caring for yourself after your procedure. Your health care provider may also give you more specific instructions. Your treatment has been planned according to current medical practices, but problems sometimes occur. Call your health care provider if you have any problems or questions after your procedure. WHAT TO EXPECT AFTER THE PROCEDURE  After your procedure, it is typical to have the following:  A small amount of blood in your stool.  Moderate amounts of gas and mild abdominal cramping or bloating. HOME CARE INSTRUCTIONS  Do not drive, operate machinery, or sign important documents for 24 hours.  You may shower and resume your regular physical activities, but move at a slower pace for the first 24 hours.  Take frequent rest periods for the first 24 hours.  Walk around or put a warm pack on your abdomen to help reduce abdominal cramping and bloating.  Drink enough fluids to keep your urine clear or pale yellow.  You may resume your normal diet as instructed by your health care provider. Avoid heavy or fried foods that are hard to digest.  Avoid drinking alcohol for 24 hours or as instructed by your health care provider.  Only take over-the-counter or prescription medicines as directed by your health care provider.  If a tissue sample (biopsy) was taken during your procedure:  Do not take aspirin or blood thinners for 7 days, or as instructed by your health care provider.  Do not drink alcohol for 7 days, or as instructed by your health  care provider.  Eat soft foods for the first 24 hours. SEEK MEDICAL CARE IF: You have persistent spotting of blood in your stool 2-3 days after the procedure. SEEK IMMEDIATE MEDICAL CARE IF:  You have more than a small spotting of blood in your stool.  You pass large blood clots in your stool.  Your abdomen is swollen (distended).  You have nausea or vomiting.  You have a fever.  You have increasing abdominal pain that is not relieved with medicine.   This information is not intended to replace advice given to you by your health care provider. Make sure you discuss any questions you have with your health care provider.   Document Released: 07/18/2004 Document Revised: 09/24/2013 Document Reviewed: 08/11/2013 Elsevier Interactive Patient Education 2016 [place] Monitored anesthesia care is an anesthesia service for a medical procedure. Anesthesia is the loss of the ability to feel pain. It is produced by medicines called anesthetics.  It may affect a small area of your body (local anesthesia), a large area of your body (regional anesthesia), or your entire body (general anesthesia). The need for monitored anesthesia care depends your procedure, your condition, and the potential need for regional or general anesthesia. It is often provided during procedures where:   General anesthesia may be needed if there are complications. This is because you need special care when you are under general anesthesia.   You will be under local or regional anesthesia. This is so that you are able to have higher levels of anesthesia if needed.   You will receive calming medicines (sedatives). This is especially the case if sedatives are given to put you in a semi-conscious state of relaxation (deep sedation). This is because the amount of sedative needed to produce this state can be hard to predict. Too much of a sedative can produce general anesthesia. Monitored anesthesia  care is performed by one or more health care providers who have special training in all types of anesthesia. You will need to meet with these health care providers before your procedure. During this meeting, they will ask you about your medical history. They will also give you instructions to follow. (For example, you will need to stop eating and drinking before your procedure. You may also need to stop or change medicines you are taking.) During your procedure, your health care providers will stay with you. They will:   Watch your condition. This includes watching your blood pressure, breathing, and level of pain.   Diagnose and treat problems that occur.   Give medicines if they are needed. These may include calming medicines (sedatives) and anesthetics.   Make sure you are comfortable.  Having monitored anesthesia care does not necessarily mean that you will be under anesthesia. It does mean that your health care providers will be able to manage anesthesia if you need it or if it occurs. It also means that you will be able to have a different type of anesthesia than you are having if you need it. When your procedure is complete, your health care providers will continue to watch your condition. They will make sure any medicines wear off before you are allowed to go home.    This information is not intended to replace advice given to you by your health care provider. Make sure you discuss any questions you have with your health care provider.   Document Released: 08/30/2005 Document Revised: 12/25/2014 Document Reviewed: 01/15/2013 Elsevier Interactive Patient Education 2016 Elsevier Inc[employer] PATIENT INSTRUCTIONS POST-ANESTHESIA  IMMEDIATELY FOLLOWING SURGERY:  Do not drive or operate machinery for the first twenty four hours after surgery.  Do not make any important decisions for twenty four hours after surgery or while taking narcotic pain medications or sedatives.  If you develop intractable  nausea and vomiting or a severe headache please notify your doctor immediately.  FOLLOW-UP:  Please make an appointment with your surgeon as instructed. You do not need to follow up with anesthesia unless specifically instructed to do so.  WOUND CARE INSTRUCTIONS (if applicable):  Keep a dry clean dressing on the anesthesia/puncture wound site if there is drainage.  Once the wound has quit draining you may leave it open to air.  Generally you should leave the bandage intact for twenty four hours unless there is drainage.  If the epidural site drains for more than 36-48 hours please call the anesthesia department.  QUESTIONS?:  Please feel free to call your physician or  the hospital operator if you have any questions, and they will be happy to assist you.

## 2016-07-31 ENCOUNTER — Encounter (HOSPITAL_COMMUNITY): Payer: Self-pay

## 2016-07-31 ENCOUNTER — Encounter (HOSPITAL_COMMUNITY)
Admission: RE | Admit: 2016-07-31 | Discharge: 2016-07-31 | Disposition: A | Discharge status: Home / Self Care | Source: Ambulatory Visit | Attending: Internal Medicine | Admitting: Internal Medicine

## 2016-07-31 DIAGNOSIS — Z01812 Encounter for preprocedural laboratory examination: Secondary | ICD-10-CM | POA: Insufficient documentation

## 2016-07-31 DIAGNOSIS — Z01810 Encounter for preprocedural cardiovascular examination: Secondary | ICD-10-CM | POA: Diagnosis not present

## 2016-07-31 DIAGNOSIS — Z1211 Encounter for screening for malignant neoplasm of colon: Secondary | ICD-10-CM

## 2016-07-31 HISTORY — DX: Sleep apnea, unspecified: G47.30

## 2016-07-31 HISTORY — DX: Gastro-esophageal reflux disease without esophagitis: K21.9

## 2016-07-31 HISTORY — DX: Essential (primary) hypertension: I10

## 2016-07-31 HISTORY — DX: Pure hypercholesterolemia, unspecified: E78.00

## 2016-07-31 HISTORY — DX: Personal history of urinary calculi: Z87.442

## 2016-07-31 HISTORY — DX: Unspecified osteoarthritis, unspecified site: M19.90

## 2016-07-31 LAB — BASIC METABOLIC PANEL
Anion gap: 6 (ref 5–15)
BUN: 22 mg/dL — ABNORMAL HIGH (ref 6–20)
CO2: 28 mmol/L (ref 22–32)
Calcium: 9.1 mg/dL (ref 8.9–10.3)
Chloride: 104 mmol/L (ref 101–111)
Creatinine, Ser: 1.27 mg/dL — ABNORMAL HIGH (ref 0.61–1.24)
GFR calc Af Amer: >60 mL/min (ref >60)
GFR calc non Af Amer: 60 mL/min — ABNORMAL LOW (ref >60)
Glucose, Bld: 122 mg/dL — ABNORMAL HIGH (ref 65–99)
Potassium: 4.1 mmol/L (ref 3.5–5.1)
Sodium: 138 mmol/L (ref 135–145)

## 2016-07-31 LAB — CBC WITH DIFFERENTIAL/PLATELET
Basophils Absolute: 0 10*3/uL (ref 0.0–0.1)
Basophils Relative: 0 %
Eosinophils Absolute: 0.4 10*3/uL (ref 0.0–0.7)
Eosinophils Relative: 5 %
HCT: 41.9 % (ref 39.0–52.0)
Hemoglobin: 13.8 g/dL (ref 13.0–17.0)
Lymphocytes Relative: 17 %
Lymphs Abs: 1.4 10*3/uL (ref 0.7–4.0)
MCH: 29.1 pg (ref 26.0–34.0)
MCHC: 32.9 g/dL (ref 30.0–36.0)
MCV: 88.4 fL (ref 78.0–100.0)
Monocytes Absolute: 0.8 10*3/uL (ref 0.1–1.0)
Monocytes Relative: 10 %
Neutro Abs: 5.3 10*3/uL (ref 1.7–7.7)
Neutrophils Relative %: 68 %
Platelets: 202 10*3/uL (ref 150–400)
RBC: 4.74 MIL/uL (ref 4.22–5.81)
RDW: 14.2 % (ref 11.5–15.5)
WBC: 7.9 10*3/uL (ref 4.0–10.5)

## 2016-07-31 NOTE — Pre-Procedure Instructions (Signed)
Patient given information to sign up for my chart at home. 

## 2016-08-03 ENCOUNTER — Telehealth (INDEPENDENT_AMBULATORY_CARE_PROVIDER_SITE_OTHER): Payer: Self-pay | Admitting: *Deleted

## 2016-08-03 NOTE — Telephone Encounter (Signed)
Patient left message to cancel TCS sch'd for tomorrow (8/18) -- he had a family emergency and he is leaving to go out of town -- he will call back to resch'd

## 2016-08-04 ENCOUNTER — Ambulatory Visit (HOSPITAL_COMMUNITY): Admission: RE | Admit: 2016-08-04 | Source: Ambulatory Visit | Admitting: Internal Medicine

## 2016-08-04 ENCOUNTER — Encounter (HOSPITAL_COMMUNITY): Admission: RE | Payer: Self-pay | Source: Ambulatory Visit

## 2016-08-04 SURGERY — COLONOSCOPY WITH PROPOFOL
Anesthesia: Monitor Anesthesia Care

## 2018-03-21 ENCOUNTER — Ambulatory Visit (HOSPITAL_COMMUNITY)
Admission: RE | Admit: 2018-03-21 | Discharge: 2018-03-21 | Disposition: A | Discharge status: Home / Self Care | Source: Ambulatory Visit | Attending: Family Medicine | Admitting: Family Medicine

## 2018-03-21 ENCOUNTER — Other Ambulatory Visit (HOSPITAL_COMMUNITY): Payer: Self-pay | Admitting: Family Medicine

## 2018-03-21 DIAGNOSIS — X58XXXA Exposure to other specified factors, initial encounter: Secondary | ICD-10-CM | POA: Diagnosis not present

## 2018-03-21 DIAGNOSIS — T1490XA Injury, unspecified, initial encounter: Secondary | ICD-10-CM

## 2018-03-21 DIAGNOSIS — M7989 Other specified soft tissue disorders: Secondary | ICD-10-CM | POA: Diagnosis present

## 2018-03-21 DIAGNOSIS — S93409A Sprain of unspecified ligament of unspecified ankle, initial encounter: Secondary | ICD-10-CM | POA: Diagnosis present

## 2018-05-25 DIAGNOSIS — M19072 Primary osteoarthritis, left ankle and foot: Secondary | ICD-10-CM | POA: Insufficient documentation

## 2019-02-13 ENCOUNTER — Emergency Department (HOSPITAL_COMMUNITY)

## 2019-02-13 ENCOUNTER — Encounter (HOSPITAL_COMMUNITY): Payer: Self-pay

## 2019-02-13 ENCOUNTER — Emergency Department (HOSPITAL_COMMUNITY)
Admission: EM | Admit: 2019-02-13 | Discharge: 2019-02-13 | Disposition: A | Discharge status: Home / Self Care | Attending: Emergency Medicine | Admitting: Emergency Medicine

## 2019-02-13 ENCOUNTER — Other Ambulatory Visit: Payer: Self-pay

## 2019-02-13 DIAGNOSIS — Y929 Unspecified place or not applicable: Secondary | ICD-10-CM | POA: Insufficient documentation

## 2019-02-13 DIAGNOSIS — S299XXA Unspecified injury of thorax, initial encounter: Secondary | ICD-10-CM | POA: Diagnosis present

## 2019-02-13 DIAGNOSIS — Y999 Unspecified external cause status: Secondary | ICD-10-CM | POA: Insufficient documentation

## 2019-02-13 DIAGNOSIS — S20211A Contusion of right front wall of thorax, initial encounter: Secondary | ICD-10-CM

## 2019-02-13 DIAGNOSIS — I10 Essential (primary) hypertension: Secondary | ICD-10-CM | POA: Insufficient documentation

## 2019-02-13 DIAGNOSIS — Z79899 Other long term (current) drug therapy: Secondary | ICD-10-CM | POA: Insufficient documentation

## 2019-02-13 DIAGNOSIS — X509XXA Other and unspecified overexertion or strenuous movements or postures, initial encounter: Secondary | ICD-10-CM | POA: Insufficient documentation

## 2019-02-13 DIAGNOSIS — Z87891 Personal history of nicotine dependence: Secondary | ICD-10-CM | POA: Diagnosis not present

## 2019-02-13 DIAGNOSIS — Y939 Activity, unspecified: Secondary | ICD-10-CM | POA: Insufficient documentation

## 2019-02-13 MED ORDER — KETOROLAC TROMETHAMINE 60 MG/2ML IM SOLN
60.0000 mg | Freq: Once | INTRAMUSCULAR | Status: AC
  Administered 2019-02-13: 60 mg via INTRAMUSCULAR
  Filled 2019-02-13: qty 2

## 2019-02-13 NOTE — ED Triage Notes (Signed)
Pt reports coughed and sneezed at the same time a few days ago and felt something "rip" in r rib area.  Reports pain since then.

## 2019-02-13 NOTE — Discharge Instructions (Addendum)
Follow-up with your doctor after the weekend if no improvement in your rib pain.  Return to emergency room if you develop fevers, vomiting, shortness of breath or new concerns. Use your pain medicines as needed from home.  Use spirometer every 3 hrs while awake.

## 2019-02-13 NOTE — ED Provider Notes (Signed)
[facility] EMERGENCY DEPARTMENT Provider Note   CSN: [id number] Arrival date & time: 02/13/19  3299    History   Chief Complaint Chief Complaint  Patient presents with  . r rib pain    HPI Shawn Fleming is a 63 y.o. male.     Patient with history of high blood pressure, kidney stones, reflux presents with right rib pain for the past week.  This started after he coughed and sneezed led to sudden pain in that area.  No shortness of breath.  Pain with movement and palpation.  No known hernia history.  No vomiting or fevers.     Past Medical History:  Diagnosis Date  . Arthritis   . GERD (gastroesophageal reflux disease)   . History of kidney stones   . Hypercholesteremia   . Hypertension   . Migraine   . Sleep apnea     There are no active problems to display for this patient.   Past Surgical History:  Procedure Laterality Date  . ADENOIDECTOMY    . APPENDECTOMY    . CENTRAL VENOUS CATHETER INSERTION Left 04/20/2013   Procedure: INSERTION CENTRAL LINE LEFT SUBCLAVIAN;  Surgeon: Donato Heinz, MD;  Location: [facility];  Service: General;  Laterality: Left;  . INCISION AND DRAINAGE PERIRECTAL ABSCESS N/A 04/20/2013   Procedure: SHARP SURGICAL DEBRIDEMENT PERINEAL INFECTION;  Surgeon: Donato Heinz, MD;  Location: [facility];  Service: General;  Laterality: N/A;  . KNEE SURGERY Right    arthroscopic  . SHOULDER SURGERY Bilateral    removal of bone.  . TONSILLECTOMY          Home Medications    Prior to Admission medications   Medication Sig Start Date End Date Taking? Authorizing Provider  alfuzosin (UROXATRAL) 10 MG 24 hr tablet Take 10 mg by mouth daily.    [provider]  buPROPion (WELLBUTRIN XL) 300 MG 24 hr tablet Take 300 mg by mouth daily.    [provider]  Cholecalciferol (VITAMIN D3) 5000 units CAPS Take 1 capsule by mouth daily.    [provider]  escitalopram (LEXAPRO) 10 MG tablet Take 10 mg by mouth daily.    [provider]  esomeprazole (NEXIUM) 40 MG capsule Take 40 mg by mouth daily.    [provider]  Multiple Vitamins-Minerals (MULTIVITAMIN PO) Take 1 tablet by mouth daily.    [provider]  naloxegol oxalate (MOVANTIK) 25 MG TABS tablet Take 25 mg by mouth daily.    [provider]  naproxen (NAPROSYN) 500 MG tablet Take 500 mg by mouth 2 (two) times daily.    [provider]  oxyCODONE (ROXICODONE) 15 MG immediate release tablet Take 15 mg by mouth every 4 (four) hours as needed for pain.    [provider]  polyethylene glycol-electrolytes (TRILYTE) 420 g solution Take 4,000 mLs by mouth once. 06/22/16   Setzer, Rona Ravens, NP  rosuvastatin (CRESTOR) 10 MG tablet Take 10 mg by mouth daily.    [provider]  verapamil (CALAN-SR) 240 MG CR tablet Take 240 mg by mouth daily.     [provider]    Family History No family history on file.  Social History Social History   Tobacco Use  . Smoking status: Former Smoker    Packs/day: 0.50    Years: 23.00    Pack years: 11.50    Types: Cigarettes  . Smokeless tobacco: Never Used  Substance Use Topics  . Alcohol  use: Yes    Comment: rare  . Drug use: No     Allergies   Patient has no known allergies.   Review of Systems Review of Systems  Constitutional: Negative for chills and fever.  HENT: Negative for congestion.   Eyes: Negative for visual disturbance.  Respiratory: Positive for cough. Negative for shortness of breath.   Cardiovascular: Negative for chest pain.  Gastrointestinal: Negative for abdominal pain and vomiting.  Musculoskeletal: Negative for back pain, neck pain and neck stiffness.  Skin: Negative for rash.  Neurological: Negative for light-headedness and headaches.     Physical Exam Updated Vital Signs BP 138/81 (BP Location: Right Arm)   Pulse 97   Temp 98.1 F (36.7 C) (Oral)   Resp 20   Ht 5\' 5"  (1.651 m)   Wt 107.5 kg   SpO2 96%   BMI  39.44 kg/m   Physical Exam Vitals signs and nursing note reviewed.  Constitutional:      Appearance: He is well-developed.  HENT:     Head: Normocephalic and atraumatic.  Eyes:     General:        Right eye: No discharge.        Left eye: No discharge.  Neck:     Musculoskeletal: Normal range of motion and neck supple.     Trachea: No tracheal deviation.  Cardiovascular:     Rate and Rhythm: Normal rate and regular rhythm.  Pulmonary:     Effort: Pulmonary effort is normal.     Breath sounds: Normal breath sounds.  Abdominal:     General: There is no distension.     Palpations: Abdomen is soft.     Tenderness: There is no abdominal tenderness. There is no guarding.  Musculoskeletal:        General: Tenderness present.     Comments: Pt tender right anterior and lateral rib to palpation and with movement of right arm  Skin:    General: Skin is warm.     Findings: No rash.  Neurological:     Mental Status: He is alert and oriented to person, place, and time.      ED Treatments / Results  Labs (all labs ordered are listed, but only abnormal results are displayed) Labs Reviewed - No data to display  EKG None  Radiology Dg Ribs Unilateral W/chest Right  Result Date: 02/13/2019 CLINICAL DATA:  Right lower anterior rib pain. EXAM: RIGHT RIBS AND CHEST - 3+ VIEW COMPARISON:  None. FINDINGS: No fracture or other bone lesions are seen involving the ribs. There is no evidence of pneumothorax or pleural effusion. Left lower lobe atelectasis and eventration of the left hemidiaphragm. Heart size and mediastinal contours are within normal limits. Calcific atherosclerotic disease of the aorta at the aortic knob. IMPRESSION: No acute rib fracture or bone destruction. Eventration of the left hemidiaphragm with left lower lobe atelectasis. Electronically Signed   By: Fidela Salisbury M.D.   On: 02/13/2019 09:33    Procedures Procedures (including critical care time)  Medications  Ordered in ED Medications  ketorolac (TORADOL) injection 60 mg (60 mg Intramuscular Given 02/13/19 0946)     Initial Impression / Assessment and Plan / ED Course  I have reviewed the triage vital signs and the nursing notes.  Pertinent labs & imaging results that were available during my care of the patient were reviewed by me and considered in my medical decision making (see chart for details).  Patient with clinically right rib pain from sneeze/cough.  No shortness of breath, x-ray reviewed no acute fracture seen.  Plan for pain meds supportive care and follow-up with primary doctor Monday if no improvement.  Results and differential diagnosis were discussed with the patient/parent/guardian. Xrays were independently reviewed by myself.  Close follow up outpatient was discussed, comfortable with the plan.   Medications  ketorolac (TORADOL) injection 60 mg (60 mg Intramuscular Given 02/13/19 0946)    Vitals:   02/13/19 0847 02/13/19 0850  BP:  138/81  Pulse:  97  Resp:  20  Temp:  98.1 F (36.7 C)  TempSrc:  Oral  SpO2:  96%  Weight: 107.5 kg   Height: 5\' 5"  (1.651 m)     Final diagnoses:  Contusion of rib, right, initial encounter    Final Clinical Impressions(s) / ED Diagnoses   Final diagnoses:  Contusion of rib, right, initial encounter    ED Discharge Orders    None       Elnora Morrison, MD 02/13/19 1000

## 2020-03-25 ENCOUNTER — Other Ambulatory Visit: Payer: Self-pay

## 2020-03-25 ENCOUNTER — Ambulatory Visit: Attending: Internal Medicine

## 2020-03-25 DIAGNOSIS — Z20822 Contact with and (suspected) exposure to covid-19: Secondary | ICD-10-CM

## 2020-03-26 ENCOUNTER — Telehealth: Payer: Self-pay | Admitting: General Practice

## 2020-03-26 ENCOUNTER — Ambulatory Visit: Payer: Self-pay | Admitting: *Deleted

## 2020-03-26 LAB — NOVEL CORONAVIRUS, NAA: SARS-CoV-2, NAA: NOT DETECTED

## 2020-03-26 LAB — SARS-COV-2, NAA 2 DAY TAT

## 2020-03-26 NOTE — Telephone Encounter (Signed)
Pt is aware covid 19 test is neg on 03-26-2020

## 2020-03-26 NOTE — Telephone Encounter (Signed)
Patient has COVID exposure- has questions about quarentine.  Reason for Disposition . [1] CLOSE CONTACT COVID-19 EXPOSURE within last 14 days AND [2] NO symptoms  Answer Assessment - Initial Assessment Questions 1. COVID-19 CLOSE CONTACT: "Who is the person with the confirmed or suspected COVID-19 infection that you were exposed to?"     Exposure to sons 2. PLACE of CONTACT: "Where were you when you were exposed to COVID-19?" (e.g., home, school, medical waiting room; which city?)     home 3. TYPE of CONTACT: "How much contact was there?" (e.g., sitting next to, live in same house, work in same office, same building)     Live together 4. DURATION of CONTACT: "How long were you in contact with the COVID-19 patient?" (e.g., a few seconds, passed by person, a few minutes, 15 minutes or longer, live with the patient)     Live together 5. MASK: "Were you wearing a mask?" "Was the other person wearing a mask?" Note: wearing a mask reduces the risk of an otherwise close contact.     no 6. DATE of CONTACT: "When did you have contact with a COVID-19 patient?" (e.g., how many days ago)     Several days ago-tested negative yesterday 7. COMMUNITY SPREAD: "Are there lots of cases of COVID-19 (community spread) where you live?" (See public health department website, if unsure)       yes 8. SYMPTOMS: "Do you have any symptoms?" (e.g., fever, cough, breathing difficulty, loss of taste or smell)     No symptoms 9. PREGNANCY OR POSTPARTUM: "Is there any chance you are pregnant?" "When was your last menstrual period?" "Did you deliver in the last 2 weeks?"     n/a 10. HIGH RISK: "Do you have any heart or lung problems? Do you have a weak immune system?" (e.g., heart failure, COPD, asthma, HIV positive, chemotherapy, renal failure, diabetes mellitus, sickle cell anemia, obesity)       age 64. TRAVEL: "Have you traveled out of the country recently?" If so, "When and where?" Also ask about out-of-state travel,  since the CDC has identified some high-risk cities for community spread in the [place] Note: Travel becomes less relevant if there is widespread community transmission where the patient lives.       n/a  Protocols used: CORONAVIRUS (COVID-19) EXPOSURE-A-AH

## 2020-04-01 ENCOUNTER — Other Ambulatory Visit: Payer: Self-pay

## 2020-04-01 ENCOUNTER — Ambulatory Visit: Attending: Internal Medicine

## 2020-04-01 ENCOUNTER — Other Ambulatory Visit

## 2020-04-01 DIAGNOSIS — Z20822 Contact with and (suspected) exposure to covid-19: Secondary | ICD-10-CM

## 2020-04-02 LAB — NOVEL CORONAVIRUS, NAA: SARS-CoV-2, NAA: DETECTED — AB

## 2020-04-02 LAB — SARS-COV-2, NAA 2 DAY TAT

## 2020-04-03 ENCOUNTER — Telehealth: Payer: Self-pay | Admitting: Nurse Practitioner

## 2020-04-03 ENCOUNTER — Telehealth: Payer: Self-pay | Admitting: Unknown Physician Specialty

## 2020-04-03 NOTE — Telephone Encounter (Signed)
Called to discuss with patient about Covid symptoms and the use of bamlanivimab, a monoclonal antibody infusion for those with mild to moderate Covid symptoms and at a high risk of hospitalization.  Pt is qualified for this infusion at the [facility] infusion center due to [employer] left to call back

## 2020-04-03 NOTE — Telephone Encounter (Signed)
Called to discuss with Iven Finn about Covid symptoms and the use of bamlanivimab, a monoclonal antibody infusion for those with mild to moderate Covid symptoms and at a high risk of hospitalization.     Pt is qualified for this infusion at the [facility] infusion center due to co-morbid conditions and/or a member of an at-risk group. (BMI >35)  Unable to reach message left.    There are no problems to display for this patient.   Alda Lea, AGPCNP-BC Pager: [phone] Amion: Bjorn Pippin

## 2020-07-28 ENCOUNTER — Encounter (HOSPITAL_COMMUNITY): Payer: Self-pay | Admitting: Emergency Medicine

## 2020-07-28 ENCOUNTER — Inpatient Hospital Stay (HOSPITAL_COMMUNITY)
Admission: EM | Admit: 2020-07-28 | Discharge: 2020-08-01 | DRG: 300 | Disposition: A | Discharge status: Home / Self Care | Attending: Family Medicine | Admitting: Family Medicine

## 2020-07-28 ENCOUNTER — Other Ambulatory Visit: Payer: Self-pay

## 2020-07-28 DIAGNOSIS — E860 Dehydration: Secondary | ICD-10-CM | POA: Diagnosis present

## 2020-07-28 DIAGNOSIS — N179 Acute kidney failure, unspecified: Secondary | ICD-10-CM | POA: Diagnosis not present

## 2020-07-28 DIAGNOSIS — I959 Hypotension, unspecified: Secondary | ICD-10-CM | POA: Diagnosis present

## 2020-07-28 DIAGNOSIS — E785 Hyperlipidemia, unspecified: Secondary | ICD-10-CM | POA: Diagnosis not present

## 2020-07-28 DIAGNOSIS — E669 Obesity, unspecified: Secondary | ICD-10-CM | POA: Diagnosis not present

## 2020-07-28 DIAGNOSIS — I5032 Chronic diastolic (congestive) heart failure: Secondary | ICD-10-CM | POA: Diagnosis not present

## 2020-07-28 DIAGNOSIS — Z6841 Body Mass Index (BMI) 40.0 and over, adult: Secondary | ICD-10-CM | POA: Diagnosis not present

## 2020-07-28 DIAGNOSIS — E1169 Type 2 diabetes mellitus with other specified complication: Secondary | ICD-10-CM | POA: Diagnosis present

## 2020-07-28 DIAGNOSIS — I82441 Acute embolism and thrombosis of right tibial vein: Secondary | ICD-10-CM | POA: Diagnosis not present

## 2020-07-28 DIAGNOSIS — E2740 Unspecified adrenocortical insufficiency: Secondary | ICD-10-CM | POA: Diagnosis not present

## 2020-07-28 DIAGNOSIS — R600 Localized edema: Secondary | ICD-10-CM | POA: Diagnosis not present

## 2020-07-28 DIAGNOSIS — Z8616 Personal history of COVID-19: Secondary | ICD-10-CM

## 2020-07-28 DIAGNOSIS — D649 Anemia, unspecified: Secondary | ICD-10-CM | POA: Diagnosis not present

## 2020-07-28 DIAGNOSIS — K219 Gastro-esophageal reflux disease without esophagitis: Secondary | ICD-10-CM | POA: Diagnosis not present

## 2020-07-28 DIAGNOSIS — E1165 Type 2 diabetes mellitus with hyperglycemia: Secondary | ICD-10-CM | POA: Diagnosis present

## 2020-07-28 DIAGNOSIS — E875 Hyperkalemia: Secondary | ICD-10-CM | POA: Diagnosis not present

## 2020-07-28 DIAGNOSIS — G43909 Migraine, unspecified, not intractable, without status migrainosus: Secondary | ICD-10-CM | POA: Diagnosis present

## 2020-07-28 DIAGNOSIS — Z87891 Personal history of nicotine dependence: Secondary | ICD-10-CM | POA: Diagnosis not present

## 2020-07-28 DIAGNOSIS — I82409 Acute embolism and thrombosis of unspecified deep veins of unspecified lower extremity: Secondary | ICD-10-CM | POA: Diagnosis present

## 2020-07-28 DIAGNOSIS — I110 Hypertensive heart disease with heart failure: Secondary | ICD-10-CM | POA: Diagnosis present

## 2020-07-28 DIAGNOSIS — E119 Type 2 diabetes mellitus without complications: Secondary | ICD-10-CM

## 2020-07-28 DIAGNOSIS — R7989 Other specified abnormal findings of blood chemistry: Secondary | ICD-10-CM | POA: Diagnosis present

## 2020-07-28 DIAGNOSIS — I10 Essential (primary) hypertension: Secondary | ICD-10-CM

## 2020-07-28 DIAGNOSIS — R739 Hyperglycemia, unspecified: Secondary | ICD-10-CM

## 2020-07-28 DIAGNOSIS — M7989 Other specified soft tissue disorders: Secondary | ICD-10-CM

## 2020-07-28 DIAGNOSIS — M549 Dorsalgia, unspecified: Secondary | ICD-10-CM | POA: Diagnosis not present

## 2020-07-28 LAB — CBC WITH DIFFERENTIAL/PLATELET
Abs Immature Granulocytes: 0.14 10*3/uL — ABNORMAL HIGH (ref 0.00–0.07)
Basophils Absolute: 0 10*3/uL (ref 0.0–0.1)
Basophils Relative: 0 %
Eosinophils Absolute: 0.4 10*3/uL (ref 0.0–0.5)
Eosinophils Relative: 3 %
HCT: 32.3 % — ABNORMAL LOW (ref 39.0–52.0)
Hemoglobin: 10.6 g/dL — ABNORMAL LOW (ref 13.0–17.0)
Immature Granulocytes: 1 %
Lymphocytes Relative: 17 %
Lymphs Abs: 2 10*3/uL (ref 0.7–4.0)
MCH: 30.4 pg (ref 26.0–34.0)
MCHC: 32.8 g/dL (ref 30.0–36.0)
MCV: 92.6 fL (ref 80.0–100.0)
Monocytes Absolute: 0.7 10*3/uL (ref 0.1–1.0)
Monocytes Relative: 6 %
Neutro Abs: 8.9 10*3/uL — ABNORMAL HIGH (ref 1.7–7.7)
Neutrophils Relative %: 73 %
Platelets: 182 10*3/uL (ref 150–400)
RBC: 3.49 MIL/uL — ABNORMAL LOW (ref 4.22–5.81)
RDW: 13.8 % (ref 11.5–15.5)
WBC: 12.2 10*3/uL — ABNORMAL HIGH (ref 4.0–10.5)
nRBC: 0 % (ref 0.0–0.2)

## 2020-07-28 LAB — COMPREHENSIVE METABOLIC PANEL
ALT: 17 U/L (ref 0–44)
AST: 10 U/L — ABNORMAL LOW (ref 15–41)
Albumin: 3.4 g/dL — ABNORMAL LOW (ref 3.5–5.0)
Alkaline Phosphatase: 48 U/L (ref 38–126)
Anion gap: 11 (ref 5–15)
BUN: 50 mg/dL — ABNORMAL HIGH (ref 8–23)
CO2: 24 mmol/L (ref 22–32)
Calcium: 8.7 mg/dL — ABNORMAL LOW (ref 8.9–10.3)
Chloride: 101 mmol/L (ref 98–111)
Creatinine, Ser: 2.36 mg/dL — ABNORMAL HIGH (ref 0.61–1.24)
GFR calc Af Amer: 32 mL/min — ABNORMAL LOW (ref >60)
GFR calc non Af Amer: 28 mL/min — ABNORMAL LOW (ref >60)
Glucose, Bld: 334 mg/dL — ABNORMAL HIGH (ref 70–99)
Potassium: 4.4 mmol/L (ref 3.5–5.1)
Sodium: 136 mmol/L (ref 135–145)
Total Bilirubin: 0.6 mg/dL (ref 0.3–1.2)
Total Protein: 5.6 g/dL — ABNORMAL LOW (ref 6.5–8.1)

## 2020-07-28 LAB — BLOOD GAS, VENOUS
Acid-Base Excess: 0.9 mmol/L (ref 0.0–2.0)
Bicarbonate: 24.1 mmol/L (ref 20.0–28.0)
Drawn by: 5250
FIO2: 24
O2 Saturation: 43.2 %
Patient temperature: 37
pCO2, Ven: 50.3 mmHg (ref 44.0–60.0)
pH, Ven: 7.334 (ref 7.250–7.430)
pO2, Ven: <31 mmHg — CL (ref 32.0–45.0)

## 2020-07-28 LAB — SARS CORONAVIRUS 2 BY RT PCR (HOSPITAL ORDER, PERFORMED IN ~~LOC~~ HOSPITAL LAB): SARS Coronavirus 2: NEGATIVE

## 2020-07-28 MED ORDER — ACETAMINOPHEN 650 MG RE SUPP: 650.0000 mg | Freq: Four times a day (QID) | RECTAL | Status: DC | PRN

## 2020-07-28 MED ORDER — ACETAMINOPHEN 325 MG PO TABS: 650.0000 mg | ORAL_TABLET | Freq: Four times a day (QID) | ORAL | Status: DC | PRN

## 2020-07-28 MED ORDER — HEPARIN SODIUM (PORCINE) 5000 UNIT/ML IJ SOLN
5000.0000 [IU] | Freq: Three times a day (TID) | INTRAMUSCULAR | Status: DC
  Administered 2020-07-29 (×3): 5000 [IU] via SUBCUTANEOUS
  Filled 2020-07-28 (×3): qty 1

## 2020-07-28 MED ORDER — SODIUM CHLORIDE 0.9 % IV BOLUS
1000.0000 mL | Freq: Once | INTRAVENOUS | Status: AC
  Administered 2020-07-28: 1000 mL via INTRAVENOUS

## 2020-07-28 MED ORDER — LACTATED RINGERS IV BOLUS
1000.0000 mL | Freq: Once | INTRAVENOUS | Status: AC
  Administered 2020-07-28: 1000 mL via INTRAVENOUS

## 2020-07-28 NOTE — ED Triage Notes (Signed)
EMS called for CBG  Of 400's.  EMS reports 440.  Given one liter of NS.  BP was 81/64 and pt started on levophed at 5 mcg by EMS.  C/o migraine.  Pt is prednisone for last 2 weeks, treated for back pain.  HR 90's.

## 2020-07-28 NOTE — H&P (Signed)
History and Physical  Shawn Fleming TDV:[id number] DOB: 1956-06-18 DOA: 07/28/2020  Referring physician: Davonna Belling, MD PCP: [name], MD  Patient coming from: Home  Chief Complaint: Low blood pressure and elevated blood glucose level  HPI: Shawn Fleming is a 64 y.o. male with medical history significant for hypertension, hyperlipidemia, migraine headache, GERD, sleep apnea (currently not using CPAP due to being unable to tolerate it), back pain and obesity who presents to the emergency department due to lightheadedness, dizziness and weakness at work today.  Patient states that it felt like he was going to pass out.  A colleague activated EMS, on arrival of EMS, patient blood glucose level was noted to be 440 and blood pressure was systolic of 81, 1 L of IV NS and IV Levophed 5 mcg was given in route to the ED.  Patient denies fever, chills, cough, chest pain.  He endorsed chronic swelling in legs and that right leg is usually more swollen than the left leg.  He was recently diagnosed to have Covid in April of this year.  ED Course: In the emergency department, BP was 81/55, but this improved with IV hydration. Work up in the ED shows normocytic anemia, leukocytosis, hyperglycemia, BUN/Cr 50/2.36 (most recent creatinine for comparison was 3 years ago and it was 1.27).  SARS coronavirus 2 was negative.  1 L of IV LR was given in the ED with improvement in BP.  Hospitalist was asked to admit.  For further evaluation and management.  Review of Systems: Constitutional: Negative for chills and fever.  HENT: Negative for ear pain and sore throat.   Eyes: Positive for blurry vision.  Negative for pain.  Respiratory: Negative for cough, chest tightness and shortness of breath.   Cardiovascular: Negative for chest pain and palpitations.  Gastrointestinal: Negative for abdominal pain and vomiting.  Endocrine: Negative for polyphagia and polyuria.  Genitourinary: Negative for decreased  urine volume Musculoskeletal: Negative for arthralgias and back pain.  Skin: Negative for color change and rash.  Allergic/Immunologic: Negative for immunocompromised state.  Neurological: Positive for headache and lightheadedness.  Negative for tremors, syncope, speech difficulty, weakness Hematological: Does not bruise/bleed easily.  All other systems reviewed and are negative   Past Medical History:  Diagnosis Date   AKI (acute kidney injury) ([place]) 07/29/2020   Arthritis    GERD (gastroesophageal reflux disease)    History of kidney stones    Hypercholesteremia    Hypertension    Migraine    Sleep apnea    Past Surgical History:  Procedure Laterality Date   ADENOIDECTOMY     APPENDECTOMY     CENTRAL VENOUS CATHETER INSERTION Left 04/20/2013   Procedure: INSERTION CENTRAL LINE LEFT SUBCLAVIAN;  Surgeon: Donato Heinz, MD;  Location: [facility];  Service: General;  Laterality: Left;   INCISION AND DRAINAGE PERIRECTAL ABSCESS N/A 04/20/2013   Procedure: SHARP SURGICAL DEBRIDEMENT PERINEAL INFECTION;  Surgeon: Donato Heinz, MD;  Location: [facility];  Service: General;  Laterality: N/A;   KNEE SURGERY Right    arthroscopic   SHOULDER SURGERY Bilateral    removal of bone.   TONSILLECTOMY      Social History:  reports that he has quit smoking. His smoking use included cigarettes. He has a 11.50 pack-year smoking history. He has never used smokeless tobacco. He reports current alcohol use. He reports that he does not use drugs.   No Known Allergies  History reviewed. No pertinent family history.   Prior to  Admission medications   Medication Sig Start Date End Date Taking? Authorizing Provider  alfuzosin (UROXATRAL) 10 MG 24 hr tablet Take 10 mg by mouth daily.    [provider]  buPROPion (WELLBUTRIN XL) 300 MG 24 hr tablet Take 300 mg by mouth daily.    [provider]  Cholecalciferol (VITAMIN D3) 5000 units CAPS Take 1 capsule by mouth daily.     [provider]  escitalopram (LEXAPRO) 10 MG tablet Take 10 mg by mouth daily.    [provider]  esomeprazole (NEXIUM) 40 MG capsule Take 40 mg by mouth daily.    [provider]  Multiple Vitamins-Minerals (MULTIVITAMIN PO) Take 1 tablet by mouth daily.    [provider]  naloxegol oxalate (MOVANTIK) 25 MG TABS tablet Take 25 mg by mouth daily.    [provider]  naproxen (NAPROSYN) 500 MG tablet Take 500 mg by mouth 2 (two) times daily.    [provider]  oxyCODONE (ROXICODONE) 15 MG immediate release tablet Take 15 mg by mouth every 4 (four) hours as needed for pain.    [provider]  polyethylene glycol-electrolytes (TRILYTE) 420 g solution Take 4,000 mLs by mouth once. 06/22/16   Setzer, Rona Ravens, NP  rosuvastatin (CRESTOR) 10 MG tablet Take 10 mg by mouth daily.    [provider]  verapamil (CALAN-SR) 240 MG CR tablet Take 240 mg by mouth daily.     [provider]    Physical Exam: BP (!) 74/45    Pulse (!) 101    Temp 97.9 F (36.6 C) (Oral)    Resp 15    Ht 5\' 5"  (1.651 m)    Wt 117.9 kg    SpO2 94%    BMI 43.27 kg/m    General: 64 y.o. year-old male well developed well nourished in no acute distress.  Alert and oriented x3.  HEENT: NCAT, EOMI  Neck: Supple, trachea medial  Cardiovascular: Regular rate and rhythm with no rubs or gallops.  No thyromegaly or JVD noted.  2/4 pulses in all 4 extremities.  Respiratory: Clear to auscultation with no wheezes or rales. Good inspiratory effort.  Abdomen: Soft nontender nondistended with normal bowel sounds x4 quadrants.  Muskuloskeletal: No cyanosis, B/L lower extremity  Edema (R>L)  Neuro: CN II-XII intact, strength, sensation, reflexes  Skin: No ulcerative lesions noted or rashes  Psychiatry: Judgement and insight appear normal. Mood is appropriate for condition and setting          Labs on Admission:  Basic Metabolic Panel: Recent Labs    Lab 07/28/20 1933  NA 136  K 4.4  CL 101  CO2 24  GLUCOSE 334*  BUN 50*  CREATININE 2.36*  CALCIUM 8.7*   Liver Function Tests: Recent Labs  Lab 07/28/20 1933  AST 10*  ALT 17  ALKPHOS 48  BILITOT 0.6  PROT 5.6*  ALBUMIN 3.4*   No results for input(s): LIPASE, AMYLASE in the last 168 hours. No results for input(s): AMMONIA in the last 168 hours. CBC: Recent Labs  Lab 07/28/20 1933  WBC 12.2*  NEUTROABS 8.9*  HGB 10.6*  HCT 32.3*  MCV 92.6  PLT 182   Cardiac Enzymes: No results for input(s): CKTOTAL, CKMB, CKMBINDEX, TROPONINI in the last 168 hours.  BNP (last 3 results) No results for input(s): BNP in the last 8760 hours.  ProBNP (last 3 results) No results for input(s): PROBNP in the last 8760 hours.  CBG: No results  for input(s): GLUCAP in the last 168 hours.  Radiological Exams on Admission: No results found.  EKG: I independently viewed the EKG done and my findings are as followed: EKG shows sinus rhythm with occasional PVCs at a rate of 89 bpm   Assessment/Plan Present on Admission:  Hypotension  Principal Problem:   Hypotension Active Problems:   AKI (acute kidney injury) ([place])   Dehydration   Hyperglycemia   Bilateral leg edema   Essential hypertension   Type 2 diabetes mellitus with hyperlipidemia (HCC)   GERD (gastroesophageal reflux disease)   Migraine headache   Hypotension Patient presents with lightheadedness, dizziness.  On checking his BP by EMS team, SBP was in the low 80s and this improved with IV hydration The cause of hypotension is unknown at this time TSH, cortisol level, orthostatic BP (when patient is able to cooperate) will be checked Hold BP meds at this time Compression stockings will be placed   Acute kidney injury/dehydration  BUN/Cr 50/2.36 (most recent creatinine for comparison was 3 years ago and it was 1.27). Renally adjust medications, avoid nephrotoxic agents/dehydration/hypotension  Hyperglycemia  secondary to type II diabetes mellitus Continue insulin sliding scale and hypoglycemia protocol  Hyperlipidemia Continue home Crestor when med rec is updated  Essential hypertension Hold BP meds at this time due to hypotension  GERD Continue Protonix  Migraine headache Stable at this time  Bilateral leg edema RLE >LLE; bilateral lower extremity ultrasound will be done in the morning  DVT prophylaxis: Heparin subcu  Code Status: Full code  Family Communication: None at bedside  Disposition Plan:  Patient is from:                        home Anticipated DC to:                   SNF or family members home Anticipated DC date:               2-3 days Anticipated DC barriers:          Unstable to discharge at this time due to hypotension   Consults called: None  Admission status: Observation    Bernadette Hoit MD [facility]  If 7PM-7AM, please contact night-coverage [email or web address]  07/29/2020, 1:12 AM

## 2020-07-28 NOTE — ED Notes (Signed)
Date and time results received: 07/28/20 2015  Test: pO2 Critical Value: <31  Name of Provider Notified: Alvino Chapel  Orders Received? Or Actions Taken?: na

## 2020-07-28 NOTE — ED Provider Notes (Addendum)
[facility] EMERGENCY DEPARTMENT Provider Note   CSN: [id number] Arrival date & time: 07/28/20  1837     History Chief Complaint  Patient presents with   Hypotension   Hyperglycemia    Shawn Fleming is a 64 y.o. male.  HPI Patient has history of diabetes.  Recently started on steroids for back pain.  Has been on it for close to 2 weeks now.  States he had a refill of it that filled also.  States he was at work today and began to feel weak and lightheaded.  States it felt as if he could pass out.  No chest pain.  For EMS blood sugar was 440 and blood pressure was 81 systolic.  Given a liter normal saline and given Levophed at 5 mcg.  No fevers.  No cough.  States he has swelling on his legs but that is pretty much unchanged.  States he increased his insulin because of the steroids.  Patient has not had the coronavirus vaccine, but did have Covid back in April.  States he left for work around 4:00 today and has not urinated since then but has otherwise been urinating frequently.  Does have a headache.    Past Medical History:  Diagnosis Date   Arthritis    GERD (gastroesophageal reflux disease)    History of kidney stones    Hypercholesteremia    Hypertension    Migraine    Sleep apnea     There are no problems to display for this patient.   Past Surgical History:  Procedure Laterality Date   ADENOIDECTOMY     APPENDECTOMY     CENTRAL VENOUS CATHETER INSERTION Left 04/20/2013   Procedure: INSERTION CENTRAL LINE LEFT SUBCLAVIAN;  Surgeon: Donato Heinz, MD;  Location: [facility];  Service: General;  Laterality: Left;   INCISION AND DRAINAGE PERIRECTAL ABSCESS N/A 04/20/2013   Procedure: SHARP SURGICAL DEBRIDEMENT PERINEAL INFECTION;  Surgeon: Donato Heinz, MD;  Location: [facility];  Service: General;  Laterality: N/A;   KNEE SURGERY Right    arthroscopic   SHOULDER SURGERY Bilateral    removal of bone.   TONSILLECTOMY         History reviewed. No pertinent  family history.  Social History   Tobacco Use   Smoking status: Former Smoker    Packs/day: 0.50    Years: 23.00    Pack years: 11.50    Types: Cigarettes   Smokeless tobacco: Never Used  Scientific laboratory technician Use: Former  Substance Use Topics   Alcohol use: Yes    Comment: rare   Drug use: No    Home Medications Prior to Admission medications   Medication Sig Start Date End Date Taking? Authorizing Provider  alfuzosin (UROXATRAL) 10 MG 24 hr tablet Take 10 mg by mouth daily.    [provider]  buPROPion (WELLBUTRIN XL) 300 MG 24 hr tablet Take 300 mg by mouth daily.    [provider]  Cholecalciferol (VITAMIN D3) 5000 units CAPS Take 1 capsule by mouth daily.    [provider]  escitalopram (LEXAPRO) 10 MG tablet Take 10 mg by mouth daily.    [provider]  esomeprazole (NEXIUM) 40 MG capsule Take 40 mg by mouth daily.    [provider]  Multiple Vitamins-Minerals (MULTIVITAMIN PO) Take 1 tablet by mouth daily.    [provider]  naloxegol oxalate (MOVANTIK) 25 MG TABS tablet Take 25 mg by mouth daily.  [provider]  naproxen (NAPROSYN) 500 MG tablet Take 500 mg by mouth 2 (two) times daily.    [provider]  oxyCODONE (ROXICODONE) 15 MG immediate release tablet Take 15 mg by mouth every 4 (four) hours as needed for pain.    [provider]  polyethylene glycol-electrolytes (TRILYTE) 420 g solution Take 4,000 mLs by mouth once. 06/22/16   Setzer, Rona Ravens, NP  rosuvastatin (CRESTOR) 10 MG tablet Take 10 mg by mouth daily.    [provider]  verapamil (CALAN-SR) 240 MG CR tablet Take 240 mg by mouth daily.     [provider]    Allergies    Patient has no known allergies.  Review of Systems   Review of Systems  Constitutional: Negative for appetite change.  HENT: Negative for congestion.   Eyes: Positive for visual disturbance.  Respiratory: Negative for  shortness of breath.   Cardiovascular: Positive for leg swelling. Negative for chest pain.  Gastrointestinal: Negative for abdominal pain.  Genitourinary: Negative for flank pain.  Musculoskeletal: Negative for back pain.  Skin: Positive for wound.  Neurological: Positive for light-headedness and headaches.  Psychiatric/Behavioral: Negative for confusion.    Physical Exam Updated Vital Signs BP 110/64    Pulse 91    Temp 97.9 F (36.6 C) (Oral)    Resp 14    Ht 5\' 5"  (1.651 m)    Wt 117.9 kg    SpO2 98%    BMI 43.27 kg/m   Physical Exam Vitals and nursing note reviewed.  Constitutional:      Appearance: Normal appearance.  Eyes:     General: No scleral icterus. Cardiovascular:     Rate and Rhythm: Regular rhythm.  Pulmonary:     Breath sounds: No wheezing or rhonchi.  Abdominal:     Tenderness: There is no abdominal tenderness.     Comments: 2 Small scabbed wounds on left anterior abdomen.  Musculoskeletal:     Cervical back: Neck supple.     Right lower leg: Edema present.     Left lower leg: Edema present.     Comments: Pitting edema bilateral lower extremities.  Skin:    General: Skin is warm.     Capillary Refill: Capillary refill takes less than 2 seconds.  Neurological:     Mental Status: He is oriented to person, place, and time.     ED Results / Procedures / Treatments   Labs (all labs ordered are listed, but only abnormal results are displayed) Labs Reviewed  BLOOD GAS, VENOUS - Abnormal; Notable for the following components:      Result Value   pO2, Ven <31.0 (*)    All other components within normal limits  COMPREHENSIVE METABOLIC PANEL - Abnormal; Notable for the following components:   Glucose, Bld 334 (*)    BUN 50 (*)    Creatinine, Ser 2.36 (*)    Calcium 8.7 (*)    Total Protein 5.6 (*)    Albumin 3.4 (*)    AST 10 (*)    GFR calc non Af Amer 28 (*)    GFR calc Af Amer 32 (*)    All other components within normal limits  CBC WITH  DIFFERENTIAL/PLATELET - Abnormal; Notable for the following components:   WBC 12.2 (*)    RBC 3.49 (*)    Hemoglobin 10.6 (*)    HCT 32.3 (*)    Neutro Abs 8.9 (*)    Abs Immature Granulocytes 0.14 (*)  All other components within normal limits  SARS CORONAVIRUS 2 BY RT PCR [facility] ORDER, [place] LAB)  URINALYSIS, ROUTINE W REFLEX MICROSCOPIC    EKG EKG Interpretation  Date/Time:  Wednesday July 28 2020 18:57:29 EDT Ventricular Rate:  89 PR Interval:  164 QRS Duration: 84 QT Interval:  338 QTC Calculation: 411 R Axis:   30 Text Interpretation: Sinus rhythm with occasional Premature ventricular complexes Otherwise normal ECG Confirmed by Davonna Belling [phone]) on 07/28/2020 7:02:29 PM   Radiology No results found.  Procedures Procedures (including critical care time)  Medications Ordered in ED Medications  sodium chloride 0.9 % bolus 1,000 mL (0 mLs Intravenous Stopped 07/28/20 2017)  lactated ringers bolus 1,000 mL (0 mLs Intravenous Stopped 07/28/20 2150)    ED Course  I have reviewed the triage vital signs and the nursing notes.  Pertinent labs & imaging results that were available during my care of the patient were reviewed by me and considered in my medical decision making (see chart for details).    MDM Rules/Calculators/A&P                          Patient presented feeling bad lightheaded.  Was hypotensive for EMS.  Sugar of 400.  Recently and currently on prednisone for back pain.  Has new acute kidney injury with creatinine up to 2.4.  Unsure of baseline but states he may have been told his had some kidney problems.  Blood pressure improved somewhat with IV fluids.  1 L by EMS and another liter here had improved somewhat.  Decreased again.  Not febrile.  Mild white count elevation.  Sepsis felt less likely cause more likely due to dehydration.  Still has not urinated however.  Most recent blood pressure 703 systolic.  Patient  feels better and is eager to eat.  With the acute kidney injury and recurrent hypotension I feels the patient benefit from admission to the hospital.  Will discuss with hospitalist  CRITICAL CARE Performed by: Davonna Belling Total critical care time: 30 minutes Critical care time was exclusive of separately billable procedures and treating other patients. Critical care was necessary to treat or prevent imminent or life-threatening deterioration. Critical care was time spent personally by me on the following activities: development of treatment plan with patient and/or surrogate as well as nursing, discussions with consultants, evaluation of patient's response to treatment, examination of patient, obtaining history from patient or surrogate, ordering and performing treatments and interventions, ordering and review of laboratory studies, ordering and review of radiographic studies, pulse oximetry and re-evaluation of patient's condition.  Final Clinical Impression(s) / ED Diagnoses Final diagnoses:  Hyperglycemia  Dehydration  AKI (acute kidney injury) [facility])    Rx / [place] Orders ED Discharge Orders    None       Davonna Belling, MD 07/28/20 2147    Davonna Belling, MD 07/28/20 2153

## 2020-07-29 ENCOUNTER — Encounter (HOSPITAL_COMMUNITY): Payer: Self-pay | Admitting: Internal Medicine

## 2020-07-29 ENCOUNTER — Observation Stay (HOSPITAL_COMMUNITY)

## 2020-07-29 DIAGNOSIS — I824Z1 Acute embolism and thrombosis of unspecified deep veins of right distal lower extremity: Secondary | ICD-10-CM

## 2020-07-29 DIAGNOSIS — R9431 Abnormal electrocardiogram [ECG] [EKG]: Secondary | ICD-10-CM | POA: Diagnosis not present

## 2020-07-29 DIAGNOSIS — R739 Hyperglycemia, unspecified: Secondary | ICD-10-CM

## 2020-07-29 DIAGNOSIS — M549 Dorsalgia, unspecified: Secondary | ICD-10-CM | POA: Diagnosis present

## 2020-07-29 DIAGNOSIS — K219 Gastro-esophageal reflux disease without esophagitis: Secondary | ICD-10-CM | POA: Diagnosis present

## 2020-07-29 DIAGNOSIS — R600 Localized edema: Secondary | ICD-10-CM

## 2020-07-29 DIAGNOSIS — E119 Type 2 diabetes mellitus without complications: Secondary | ICD-10-CM

## 2020-07-29 DIAGNOSIS — I82409 Acute embolism and thrombosis of unspecified deep veins of unspecified lower extremity: Secondary | ICD-10-CM | POA: Diagnosis present

## 2020-07-29 DIAGNOSIS — E785 Hyperlipidemia, unspecified: Secondary | ICD-10-CM

## 2020-07-29 DIAGNOSIS — N179 Acute kidney failure, unspecified: Secondary | ICD-10-CM | POA: Diagnosis present

## 2020-07-29 DIAGNOSIS — E1165 Type 2 diabetes mellitus with hyperglycemia: Secondary | ICD-10-CM | POA: Diagnosis present

## 2020-07-29 DIAGNOSIS — I10 Essential (primary) hypertension: Secondary | ICD-10-CM

## 2020-07-29 DIAGNOSIS — E860 Dehydration: Secondary | ICD-10-CM | POA: Diagnosis present

## 2020-07-29 DIAGNOSIS — I5032 Chronic diastolic (congestive) heart failure: Secondary | ICD-10-CM | POA: Diagnosis present

## 2020-07-29 DIAGNOSIS — I82441 Acute embolism and thrombosis of right tibial vein: Secondary | ICD-10-CM | POA: Diagnosis present

## 2020-07-29 DIAGNOSIS — Z87891 Personal history of nicotine dependence: Secondary | ICD-10-CM | POA: Diagnosis not present

## 2020-07-29 DIAGNOSIS — Z8616 Personal history of COVID-19: Secondary | ICD-10-CM | POA: Diagnosis not present

## 2020-07-29 DIAGNOSIS — Z6841 Body Mass Index (BMI) 40.0 and over, adult: Secondary | ICD-10-CM | POA: Diagnosis not present

## 2020-07-29 DIAGNOSIS — G43909 Migraine, unspecified, not intractable, without status migrainosus: Secondary | ICD-10-CM | POA: Diagnosis present

## 2020-07-29 DIAGNOSIS — E2740 Unspecified adrenocortical insufficiency: Secondary | ICD-10-CM | POA: Diagnosis present

## 2020-07-29 DIAGNOSIS — D649 Anemia, unspecified: Secondary | ICD-10-CM | POA: Diagnosis present

## 2020-07-29 DIAGNOSIS — R7989 Other specified abnormal findings of blood chemistry: Secondary | ICD-10-CM | POA: Diagnosis not present

## 2020-07-29 DIAGNOSIS — I959 Hypotension, unspecified: Secondary | ICD-10-CM | POA: Diagnosis present

## 2020-07-29 DIAGNOSIS — I110 Hypertensive heart disease with heart failure: Secondary | ICD-10-CM | POA: Diagnosis present

## 2020-07-29 DIAGNOSIS — E1169 Type 2 diabetes mellitus with other specified complication: Secondary | ICD-10-CM

## 2020-07-29 DIAGNOSIS — E875 Hyperkalemia: Secondary | ICD-10-CM | POA: Diagnosis not present

## 2020-07-29 DIAGNOSIS — E669 Obesity, unspecified: Secondary | ICD-10-CM | POA: Diagnosis present

## 2020-07-29 HISTORY — DX: Acute kidney failure, unspecified: N17.9

## 2020-07-29 LAB — CBC
HCT: 30.9 % — ABNORMAL LOW (ref 39.0–52.0)
Hemoglobin: 10.1 g/dL — ABNORMAL LOW (ref 13.0–17.0)
MCH: 30.1 pg (ref 26.0–34.0)
MCHC: 32.7 g/dL (ref 30.0–36.0)
MCV: 92.2 fL (ref 80.0–100.0)
Platelets: 179 10*3/uL (ref 150–400)
RBC: 3.35 MIL/uL — ABNORMAL LOW (ref 4.22–5.81)
RDW: 13.8 % (ref 11.5–15.5)
WBC: 10.9 10*3/uL — ABNORMAL HIGH (ref 4.0–10.5)
nRBC: 0 % (ref 0.0–0.2)

## 2020-07-29 LAB — PROTIME-INR
INR: 1 (ref 0.8–1.2)
Prothrombin Time: 13 seconds (ref 11.4–15.2)

## 2020-07-29 LAB — URINALYSIS, ROUTINE W REFLEX MICROSCOPIC
Bacteria, UA: NONE SEEN
Bilirubin Urine: NEGATIVE
Glucose, UA: >=500 mg/dL — AB
Hgb urine dipstick: NEGATIVE
Ketones, ur: NEGATIVE mg/dL
Leukocytes,Ua: NEGATIVE
Nitrite: NEGATIVE
Protein, ur: NEGATIVE mg/dL
Specific Gravity, Urine: 1.018 (ref 1.005–1.030)
pH: 5 (ref 5.0–8.0)

## 2020-07-29 LAB — HIV ANTIBODY (ROUTINE TESTING W REFLEX): HIV Screen 4th Generation wRfx: NONREACTIVE

## 2020-07-29 LAB — COMPREHENSIVE METABOLIC PANEL
ALT: 16 U/L (ref 0–44)
AST: 10 U/L — ABNORMAL LOW (ref 15–41)
Albumin: 3.3 g/dL — ABNORMAL LOW (ref 3.5–5.0)
Alkaline Phosphatase: 48 U/L (ref 38–126)
Anion gap: 8 (ref 5–15)
BUN: 47 mg/dL — ABNORMAL HIGH (ref 8–23)
CO2: 23 mmol/L (ref 22–32)
Calcium: 8.8 mg/dL — ABNORMAL LOW (ref 8.9–10.3)
Chloride: 102 mmol/L (ref 98–111)
Creatinine, Ser: 2.18 mg/dL — ABNORMAL HIGH (ref 0.61–1.24)
GFR calc Af Amer: 36 mL/min — ABNORMAL LOW (ref >60)
GFR calc non Af Amer: 31 mL/min — ABNORMAL LOW (ref >60)
Glucose, Bld: 374 mg/dL — ABNORMAL HIGH (ref 70–99)
Potassium: 5.4 mmol/L — ABNORMAL HIGH (ref 3.5–5.1)
Sodium: 133 mmol/L — ABNORMAL LOW (ref 135–145)
Total Bilirubin: 0.6 mg/dL (ref 0.3–1.2)
Total Protein: 5.5 g/dL — ABNORMAL LOW (ref 6.5–8.1)

## 2020-07-29 LAB — HEMOGLOBIN A1C
Hgb A1c MFr Bld: 12.3 % — ABNORMAL HIGH (ref 4.8–5.6)
Mean Plasma Glucose: 306.31 mg/dL

## 2020-07-29 LAB — CORTISOL-AM, BLOOD: Cortisol - AM: 4 ug/dL — ABNORMAL LOW (ref 6.7–22.6)

## 2020-07-29 LAB — ECHOCARDIOGRAM COMPLETE
AR max vel: 2.92 cm2
AV Area VTI: 2.98 cm2
AV Area mean vel: 2.7 cm2
AV Mean grad: 4.3 mmHg
AV Peak grad: 9.1 mmHg
Ao pk vel: 1.51 m/s
Area-P 1/2: 3.11 cm2
Height: 65 in
S' Lateral: 2.58 cm
Weight: 4160 oz

## 2020-07-29 LAB — PHOSPHORUS: Phosphorus: 3 mg/dL (ref 2.5–4.6)

## 2020-07-29 LAB — GLUCOSE, CAPILLARY
Glucose-Capillary: 360 mg/dL — ABNORMAL HIGH (ref 70–99)
Glucose-Capillary: 308 mg/dL — ABNORMAL HIGH (ref 70–99)

## 2020-07-29 LAB — CBG MONITORING, ED
Glucose-Capillary: 400 mg/dL — ABNORMAL HIGH (ref 70–99)
Glucose-Capillary: 344 mg/dL — ABNORMAL HIGH (ref 70–99)
Glucose-Capillary: 286 mg/dL — ABNORMAL HIGH (ref 70–99)

## 2020-07-29 LAB — TSH: TSH: 0.622 u[IU]/mL (ref 0.350–4.500)

## 2020-07-29 LAB — APTT: aPTT: 26 seconds (ref 24–36)

## 2020-07-29 LAB — MAGNESIUM: Magnesium: 2 mg/dL (ref 1.7–2.4)

## 2020-07-29 MED ORDER — HYDROCORTISONE NA SUCCINATE PF 100 MG IJ SOLR: 50.0000 mg | Freq: Three times a day (TID) | INTRAMUSCULAR | Status: DC

## 2020-07-29 MED ORDER — COSYNTROPIN 0.25 MG IJ SOLR
0.2500 mg | Freq: Once | INTRAMUSCULAR | Status: AC
  Administered 2020-07-30: 0.25 mg via INTRAVENOUS
  Filled 2020-07-29: qty 0.25

## 2020-07-29 MED ORDER — HEPARIN BOLUS VIA INFUSION
4000.0000 [IU] | Freq: Once | INTRAVENOUS | Status: AC
  Administered 2020-07-29: 4000 [IU] via INTRAVENOUS
  Filled 2020-07-29: qty 4000

## 2020-07-29 MED ORDER — SODIUM ZIRCONIUM CYCLOSILICATE 5 G PO PACK
10.0000 g | PACK | Freq: Once | ORAL | Status: AC
  Administered 2020-07-29: 10 g via ORAL
  Filled 2020-07-29: qty 2

## 2020-07-29 MED ORDER — INSULIN ASPART 100 UNIT/ML ~~LOC~~ SOLN
0.0000 [IU] | Freq: Every day | SUBCUTANEOUS | Status: DC
  Administered 2020-07-29: 5 [IU] via SUBCUTANEOUS
  Administered 2020-07-29: 4 [IU] via SUBCUTANEOUS
  Filled 2020-07-29: qty 1

## 2020-07-29 MED ORDER — INSULIN ASPART 100 UNIT/ML ~~LOC~~ SOLN
0.0000 [IU] | Freq: Three times a day (TID) | SUBCUTANEOUS | Status: DC
  Administered 2020-07-29: 11 [IU] via SUBCUTANEOUS
  Administered 2020-07-29: 15 [IU] via SUBCUTANEOUS
  Administered 2020-07-29 – 2020-07-30 (×2): 8 [IU] via SUBCUTANEOUS
  Administered 2020-07-30: 3 [IU] via SUBCUTANEOUS
  Administered 2020-07-30: 11 [IU] via SUBCUTANEOUS
  Filled 2020-07-29 (×2): qty 1

## 2020-07-29 MED ORDER — HYDROCORTISONE NA SUCCINATE PF 100 MG IJ SOLR
50.0000 mg | Freq: Three times a day (TID) | INTRAMUSCULAR | Status: DC
  Filled 2020-07-29: qty 2

## 2020-07-29 MED ORDER — PANTOPRAZOLE SODIUM 40 MG PO TBEC
40.0000 mg | DELAYED_RELEASE_TABLET | Freq: Every day | ORAL | Status: DC
  Administered 2020-07-29 – 2020-08-01 (×4): 40 mg via ORAL
  Filled 2020-07-29 (×4): qty 1

## 2020-07-29 MED ORDER — LACTATED RINGERS IV SOLN: Freq: Once | INTRAVENOUS | Status: AC

## 2020-07-29 MED ORDER — PNEUMOCOCCAL VAC POLYVALENT 25 MCG/0.5ML IJ INJ
0.5000 mL | INJECTION | INTRAMUSCULAR | Status: DC
  Filled 2020-07-29: qty 0.5

## 2020-07-29 MED ORDER — ROSUVASTATIN CALCIUM 10 MG PO TABS
10.0000 mg | ORAL_TABLET | Freq: Every day | ORAL | Status: DC
  Administered 2020-07-29 – 2020-08-01 (×4): 10 mg via ORAL
  Filled 2020-07-29 (×5): qty 1

## 2020-07-29 MED ORDER — PREDNISONE 20 MG PO TABS
20.0000 mg | ORAL_TABLET | Freq: Three times a day (TID) | ORAL | Status: DC
  Administered 2020-07-29 (×2): 20 mg via ORAL
  Filled 2020-07-29 (×2): qty 1

## 2020-07-29 MED ORDER — INSULIN GLARGINE 100 UNIT/ML ~~LOC~~ SOLN
22.0000 [IU] | Freq: Every day | SUBCUTANEOUS | Status: DC
  Administered 2020-07-29 – 2020-07-30 (×2): 22 [IU] via SUBCUTANEOUS
  Filled 2020-07-29 (×6): qty 0.22

## 2020-07-29 MED ORDER — HEPARIN (PORCINE) 25000 UT/250ML-% IV SOLN
1250.0000 [IU]/h | INTRAVENOUS | Status: DC
  Administered 2020-07-29: 1450 [IU]/h via INTRAVENOUS
  Filled 2020-07-29 (×2): qty 250

## 2020-07-29 NOTE — Progress Notes (Signed)
*  PRELIMINARY RESULTS* Echocardiogram 2D Echocardiogram has been performed.  Shawn Fleming 07/29/2020, 9:21 AM

## 2020-07-29 NOTE — Progress Notes (Signed)
Patient Demographics:    Shawn Fleming, is a 64 y.o. male, DOB - 02-Mar-1956, OMB:[id number]  Admit date - 07/28/2020   Admitting Physician Herberta Pickron Denton Brick, MD  Outpatient Primary MD for the patient is [name], MD  LOS - 0   Chief Complaint  Patient presents with  . Hypotension  . Hyperglycemia        Subjective:    Shawn Fleming today has no fevers, no emesis,   -Dizziness and unsteady gait persist -Right lower extremity swelling persist  dyspnea on exertion persist, denies frank chest pains  Assessment  & Plan :    Principal Problem:   Rt DVT (deep venous thrombosis)/VTE Active Problems:   Hypotension   AKI (acute kidney injury) ([place])   Dehydration   Hyperglycemia   Bilateral leg edema   Essential hypertension   Type 2 diabetes mellitus with hyperlipidemia (HCC)   GERD (gastroesophageal reflux disease)   Migraine headache  Brief Summary:- 64 y.o. male with medical history significant for hypertension, hyperlipidemia, migraine headache, GERD, sleep apnea (currently not using CPAP due to being unable to tolerate it), back pain and obesity who presents to the emergency department due to lightheadedness, dizziness and weakness admitted on 07/29/2019 for persistent hypotension, and found to have right lower extremity DVT  A/p 1)Rt LE DVT--started on IV heparin, suspect patient probably has PE however given creatinine above 2 unable to do CTA --Right lower extremity swelling persist  dyspnea on exertion persist, denies frank chest pains  2)Recent COVID-19 infection--- diagnosis April 2021, currently asymptomatic -Suspect patient may be hypercoagulable due to recent COVID-19 infection I reduced activity level post Covid  3)Adrenal insufficiency--- a.m. cortisol 4.7, ACTH stimulation test requested -Patient has been chronically on prednisone for the last 4 weeks prescribed by his PCP  Dr. Cindie Laroche for back pain -Anticipate starting hydrocortisone in a.m.  4)Persistent Hypotension--- suspect related to #1 and #3 above --Bps improved  5)HFpEF--- echo with chronic diastolic dysfunction, EF 60 to  65%, appears compensated -Be judicious with IV fluids  6)AKI----acute kidney injury with hyperkalemia-   - creatinine on admission=  2.36 ,  baseline creatinine = none recently    , creatinine is now= 2.18 ,  renally adjust medications, avoid nephrotoxic agents / dehydration  / hypotension --Give Lokelma  -Hold lisinopril and hold Metformin C/n IVF  7)DM2-uncontrolled DM with hemoglobin A1c of 12.3, -Give Lantus 20 units and Use Novolog/Humalog Sliding scale insulin with Accu-Cheks/Fingersticks as ordered   Disposition/Need for in-Hospital Stay- patient unable to be discharged at this time due to --- acute right lower extremity DVT requiring IV heparin, acute kidney injury requiring IV fluids  Status is: Inpatient  Remains inpatient appropriate because:acute right lower extremity DVT requiring IV heparin, acute kidney injury requiring IV fluids  Disposition: The patient is from: Home              Anticipated d/c is to: Home              Anticipated d/c date is: 2 days              Patient currently is not medically stable to d/c. Barriers: Not Clinically Stable- -acute right lower extremity DVT requiring IV heparin, acute kidney injury requiring IV  fluids  Code Status : Full   Family Communication:   (patient is alert, awake and coherent)   Consults  :  Full code  DVT Prophylaxis  :  Iv heparin - SCDs   Lab Results  Component Value Date   PLT 179 07/29/2020    Inpatient Medications  Scheduled Meds: . [START ON 07/30/2020] cosyntropin  0.25 mg Intravenous Once  . hydrocortisone sod succinate (SOLU-CORTEF) inj  50 mg Intravenous Q8H  . insulin aspart  0-15 Units Subcutaneous TID WC  . insulin aspart  0-5 Units Subcutaneous QHS  . insulin glargine  22 Units  Subcutaneous Daily  . pantoprazole  40 mg Oral Daily  . [START ON 07/30/2020] pneumococcal 23 valent vaccine  0.5 mL Intramuscular Tomorrow-1000  . rosuvastatin  10 mg Oral Daily   Continuous Infusions: PRN Meds:.acetaminophen **OR** acetaminophen   Anti-infectives (From admission, onward)   None       Objective:   Vitals:   07/29/20 1000 07/29/20 1100 07/29/20 1200 07/29/20 1900  BP: 100/83 105/75 (!) 102/56 102/74  Pulse: 98 84 80 90  Resp: 15 17 18 19   Temp:    97.9 F (36.6 C)  TempSrc:      SpO2: 96% 95% 96%   Weight:      Height:        Wt Readings from Last 3 Encounters:  07/28/20 117.9 kg  02/13/19 107.5 kg  07/31/16 107.5 kg    Intake/Output Summary (Last 24 hours) at 07/29/2020 1914 Last data filed at 07/29/2020 0841 Gross per 24 hour  Intake --  Output 750 ml  Net -750 ml    Physical Exam  Gen:- Awake Alert,  In no apparent distress  HEENT:- [place].AT, No sclera icterus Neck-Supple Neck,No JVD,.  Lungs-  CTAB , fair symmetrical air movement CV- S1, S2 normal, regular  Abd-  +ve B.Sounds, Abd Soft, No tenderness,    Extremity/Skin:-Bilateral lower extremity edema/swelling right more than left, pedal pulses present  Psych-affect is appropriate, oriented x3 Neuro-no new focal deficits, no tremors   Data Review:   Micro Results Recent Results (from the past 240 hour(s))  SARS Coronavirus 2 by RT PCR (hospital order, performed in [facility] hospital lab) Nasopharyngeal Nasopharyngeal Swab     Status: None   Collection Time: 07/28/20  8:41 PM   Specimen: Nasopharyngeal Swab  Result Value Ref Range Status   SARS Coronavirus 2 NEGATIVE NEGATIVE Final    Comment: (NOTE) SARS-CoV-2 target nucleic acids are NOT DETECTED.  The SARS-CoV-2 RNA is generally detectable in upper and lower respiratory specimens during the acute phase of infection. The lowest concentration of SARS-CoV-2 viral copies this assay can detect is 250 copies / mL. A negative result  does not preclude SARS-CoV-2 infection and should not be used as the sole basis for treatment or other patient management decisions.  A negative result may occur with improper specimen collection / handling, submission of specimen other than nasopharyngeal swab, presence of viral mutation(s) within the areas targeted by this assay, and inadequate number of viral copies (<250 copies / mL). A negative result must be combined with clinical observations, patient history, and epidemiological information.  Fact Sheet for Patients:   StrictlyIdeas.no  Fact Sheet for Healthcare Providers: BankingDealers.co.za  This test is not yet approved or  cleared by the [place] FDA and has been authorized for detection and/or diagnosis of SARS-CoV-2 by FDA under an Emergency Use Authorization (EUA).  This EUA will remain in effect (meaning  this test can be used) for the duration of the COVID-19 declaration under Section 564(b)(1) of the Act, 21 U.S.C. section 360bbb-3(b)(1), unless the authorization is terminated or revoked sooner.  Performed at [facility], [address], [place], [place] [address]     Radiology Reports US Venous Img Lower Bilateral (DVT)  Result Date: 07/29/2020 CLINICAL DATA:  Lower extremity edema EXAM: BILATERAL LOWER EXTREMITY VENOUS DUPLEX ULTRASOUND TECHNIQUE: Gray-scale sonography with graded compression, as well as color Doppler and duplex ultrasound were performed to evaluate the lower extremity deep venous systems from the level of the common femoral vein and including the common femoral, femoral, profunda femoral, popliteal and calf veins including the posterior tibial, peroneal and gastrocnemius veins when visible. The superficial great saphenous vein was also interrogated. Spectral Doppler was utilized to evaluate flow at rest and with distal augmentation maneuvers in the common femoral, femoral and popliteal veins.  COMPARISON:  None. FINDINGS: RIGHT LOWER EXTREMITY Common Femoral Vein: No evidence of thrombus. Normal compressibility, respiratory phasicity and response to augmentation. Saphenofemoral Junction: No evidence of thrombus. Normal compressibility and flow on color Doppler imaging. Profunda Femoral Vein: No evidence of thrombus. Normal compressibility and flow on color Doppler imaging. Femoral Vein: No evidence of thrombus. Normal compressibility, respiratory phasicity and response to augmentation. Popliteal Vein: No evidence of thrombus. Normal compressibility, respiratory phasicity and response to augmentation. Calf Veins: There is acute appearing thrombus in the right posterior tibial vein with absence of flow in this vessel. No compression of this vessel. Peroneal vein on the right appears patent. Superficial Great Saphenous Vein: No evidence of thrombus. Normal compressibility. Venous Reflux:  None. Other Findings:  None. LEFT LOWER EXTREMITY Common Femoral Vein: No evidence of thrombus. Normal compressibility, respiratory phasicity and response to augmentation. Saphenofemoral Junction: No evidence of thrombus. Normal compressibility and flow on color Doppler imaging. Profunda Femoral Vein: No evidence of thrombus. Normal compressibility and flow on color Doppler imaging. Femoral Vein: No evidence of thrombus. Normal compressibility, respiratory phasicity and response to augmentation. Popliteal Vein: No evidence of thrombus. Normal compressibility, respiratory phasicity and response to augmentation. Calf Veins: No evidence of thrombus. Normal compressibility and flow on color Doppler imaging. Superficial Great Saphenous Vein: No evidence of thrombus. Normal compressibility. Venous Reflux:  None. Other Findings:  None. IMPRESSION: 1. Acute appearing deep venous thrombosis in the right posterior tibial vein. No deep venous thrombosis at or above the knee on the right noted. 2. No evident deep venous thrombosis in  the left lower extremity venous system. Electronically Signed   By: Lowella Grip III M.D.   On: 07/29/2020 09:57   ECHOCARDIOGRAM COMPLETE  Result Date: 07/29/2020    ECHOCARDIOGRAM REPORT   Patient Name:   Shawn Fleming Date of Exam: 07/29/2020 Medical Rec #:  [id number]      Height:       65.0 in Accession #:    [id number]     Weight:       260.0 lb Date of Birth:  1956-03-10     BSA:          2.211 m Patient Age:    89 years       BP:           101/64 mmHg Patient Gender: M              HR:           79 bpm. Exam Location:  [facility] Procedure: 2D Echo, Cardiac Doppler and Color Doppler  Indications:    Abnormal ECG 794.31 / R94.31  History:        Patient has no prior history of Echocardiogram examinations.                 Risk Factors:Hypertension, Diabetes and Dyslipidemia. GERD,                 Obesity, Sleep apnea (From Hx).  Sonographer:    Alvino Chapel RCS Referring Phys: [phone] Amylee Lodato IMPRESSIONS  1. Left ventricular ejection fraction, by estimation, is 60 to 65%. The left ventricle has normal function. The left ventricle has no regional wall motion abnormalities. Left ventricular diastolic parameters are consistent with Grade I diastolic dysfunction (impaired relaxation).  2. Right ventricular systolic function is normal. The right ventricular size is normal.  3. The mitral valve is normal in structure. No evidence of mitral valve regurgitation. No evidence of mitral stenosis.  4. The aortic valve has an indeterminant number of cusps. Aortic valve regurgitation is not visualized. No aortic stenosis is present.  5. The inferior vena cava is normal in size with greater than 50% respiratory variability, suggesting right atrial pressure of 3 mmHg. FINDINGS  Left Ventricle: Left ventricular ejection fraction, by estimation, is 60 to 65%. The left ventricle has normal function. The left ventricle has no regional wall motion abnormalities. The left ventricular internal cavity size was normal  in size. There is  no left ventricular hypertrophy. Left ventricular diastolic parameters are consistent with Grade I diastolic dysfunction (impaired relaxation). Normal left ventricular filling pressure. Right Ventricle: The right ventricular size is normal. No increase in right ventricular wall thickness. Right ventricular systolic function is normal. Left Atrium: Left atrial size was normal in size. Right Atrium: Right atrial size was normal in size. Pericardium: There is no evidence of pericardial effusion. Mitral Valve: The mitral valve is normal in structure. No evidence of mitral valve regurgitation. No evidence of mitral valve stenosis. Tricuspid Valve: The tricuspid valve is not well visualized. Tricuspid valve regurgitation is not demonstrated. No evidence of tricuspid stenosis. Aortic Valve: The aortic valve has an indeterminant number of cusps. Aortic valve regurgitation is not visualized. No aortic stenosis is present. Aortic valve mean gradient measures 4.3 mmHg. Aortic valve peak gradient measures 9.1 mmHg. Aortic valve area, by VTI measures 2.98 cm. Pulmonic Valve: The pulmonic valve was not well visualized. Pulmonic valve regurgitation is not visualized. No evidence of pulmonic stenosis. Aorta: The aortic root is normal in size and structure. Pulmonary Artery: Indeterminant PASP, inadequate TR jet. Venous: The inferior vena cava is normal in size with greater than 50% respiratory variability, suggesting right atrial pressure of 3 mmHg. IAS/Shunts: No atrial level shunt detected by color flow Doppler.  LEFT VENTRICLE PLAX 2D LVIDd:         4.01 cm  Diastology LVIDs:         2.58 cm  LV e' lateral:   9.36 cm/s LV PW:         0.90 cm  LV E/e' lateral: 6.6 LV IVS:        0.85 cm  LV e' medial:    6.64 cm/s LVOT diam:     2.20 cm  LV E/e' medial:  9.4 LV SV:         76 LV SV Index:   34 LVOT Area:     3.80 cm  RIGHT VENTRICLE RV S prime:     15.60 cm/s TAPSE (M-mode): 1.9 cm LEFT ATRIUM  Index       RIGHT ATRIUM           Index LA diam:        3.60 cm 1.63 cm/m  RA Area:     10.70 cm LA Vol (A2C):   40.6 ml 18.36 ml/m RA Volume:   25.40 ml  11.49 ml/m LA Vol (A4C):   47.2 ml 21.34 ml/m LA Biplane Vol: 44.9 ml 20.30 ml/m  AORTIC VALVE AV Area (Vmax):    2.92 cm AV Area (Vmean):   2.70 cm AV Area (VTI):     2.98 cm AV Vmax:           150.83 cm/s AV Vmean:          97.555 cm/s AV VTI:            0.254 m AV Peak Grad:      9.1 mmHg AV Mean Grad:      4.3 mmHg LVOT Vmax:         116.00 cm/s LVOT Vmean:        69.200 cm/s LVOT VTI:          0.199 m LVOT/AV VTI ratio: 0.78  AORTA Ao Root diam: 3.10 cm MITRAL VALVE MV Area (PHT): 3.11 cm    SHUNTS MV Decel Time: 244 msec    Systemic VTI:  0.20 m MV E velocity: 62.20 cm/s  Systemic Diam: 2.20 cm MV A velocity: 83.60 cm/s MV E/A ratio:  0.74 Carlyle Dolly MD Electronically signed by Carlyle Dolly MD Signature Date/Time: 07/29/2020/1:27:00 PM    Final      CBC Recent Labs  Lab 07/28/20 1933 07/29/20 0517  WBC 12.2* 10.9*  HGB 10.6* 10.1*  HCT 32.3* 30.9*  PLT 182 179  MCV 92.6 92.2  MCH 30.4 30.1  MCHC 32.8 32.7  RDW 13.8 13.8  LYMPHSABS 2.0  --   MONOABS 0.7  --   EOSABS 0.4  --   BASOSABS 0.0  --     Chemistries  Recent Labs  Lab 07/28/20 1933 07/29/20 0517  NA 136 133*  K 4.4 5.4*  CL 101 102  CO2 24 23  GLUCOSE 334* 374*  BUN 50* 47*  CREATININE 2.36* 2.18*  CALCIUM 8.7* 8.8*  MG  --  2.0  AST 10* 10*  ALT 17 16  ALKPHOS 48 48  BILITOT 0.6 0.6   ------------------------------------------------------------------------------------------------------------------ No results for input(s): CHOL, HDL, LDLCALC, TRIG, CHOLHDL, LDLDIRECT in the last 72 hours.  Lab Results  Component Value Date   HGBA1C 12.3 (H) 07/28/2020   ------------------------------------------------------------------------------------------------------------------ Recent Labs    07/29/20 0517  TSH 0.622    ------------------------------------------------------------------------------------------------------------------ No results for input(s): VITAMINB12, FOLATE, FERRITIN, TIBC, IRON, RETICCTPCT in the last 72 hours.  Coagulation profile Recent Labs  Lab 07/29/20 0517  INR 1.0    No results for input(s): DDIMER in the last 72 hours.  Cardiac Enzymes No results for input(s): CKMB, TROPONINI, MYOGLOBIN in the last 168 hours.  Invalid input(s): CK ------------------------------------------------------------------------------------------------------------------ No results found for: BNP   Roxan Hockey M.D on 07/29/2020 at 7:14 PM  Go to [email or web address] - for contact info  [facility] - Office  [phone]

## 2020-07-29 NOTE — Progress Notes (Signed)
ANTICOAGULATION CONSULT NOTE - Initial Consult  Pharmacy Consult for Heparin Indication: DVT, right leg  No Known Allergies  Patient Measurements: Height: 5\' 5"  (165.1 cm) Weight: 117.9 kg (260 lb) IBW/kg (Calculated) : 61.5 Heparin Dosing Weight: 92 kg  Vital Signs: Temp: 97.9 F (36.6 C) (08/12 1900) BP: 102/74 (08/12 1900) Pulse Rate: 90 (08/12 1900)  Labs: Recent Labs    07/28/20 1933 07/29/20 0517  HGB 10.6* 10.1*  HCT 32.3* 30.9*  PLT 182 179  APTT  --  26  LABPROT  --  13.0  INR  --  1.0  CREATININE 2.36* 2.18*    Estimated Creatinine Clearance: 40.7 mL/min (A) (by C-G formula based on SCr of 2.18 mg/dL (H)).   Medical History: Past Medical History:  Diagnosis Date  . AKI (acute kidney injury) ([place]) 07/29/2020  . Arthritis   . GERD (gastroesophageal reflux disease)   . History of kidney stones   . Hypercholesteremia   . Hypertension   . Migraine   . Sleep apnea    Assessment:  64 yr old male to begin IV heparin for right leg DVT per duplex.  Has been on SQ heparin for VTE prophylaxis, last dose at 2pm today.   AKI, CBC stable.  Not on anticoagulation PTA.  Goal of Therapy:  Heparin level 0.3-0.7 units/ml Monitor platelets by anticoagulation protocol: Yes   Plan:   SQ heparin already discontinued.  Heparin 4000 units IV x 1 bolus.  Heparin drip to begin at 1450 units/hr  Heparin level ~8 hrs after drip begins.  Daily heparin level and CBC  Monitor for s/sx bleeding.  Arty Baumgartner, [place] Phone: [phone] 07/29/2020,7:21 PM

## 2020-07-30 DIAGNOSIS — R7989 Other specified abnormal findings of blood chemistry: Secondary | ICD-10-CM

## 2020-07-30 LAB — HEPARIN LEVEL (UNFRACTIONATED)
Heparin Unfractionated: 0.98 IU/mL — ABNORMAL HIGH (ref 0.30–0.70)
Heparin Unfractionated: 1.32 IU/mL — ABNORMAL HIGH (ref 0.30–0.70)
Heparin Unfractionated: 0.75 IU/mL — ABNORMAL HIGH (ref 0.30–0.70)

## 2020-07-30 LAB — COMPREHENSIVE METABOLIC PANEL
ALT: 15 U/L (ref 0–44)
AST: 9 U/L — ABNORMAL LOW (ref 15–41)
Albumin: 3.2 g/dL — ABNORMAL LOW (ref 3.5–5.0)
Alkaline Phosphatase: 45 U/L (ref 38–126)
Anion gap: 8 (ref 5–15)
BUN: 39 mg/dL — ABNORMAL HIGH (ref 8–23)
CO2: 25 mmol/L (ref 22–32)
Calcium: 8.8 mg/dL — ABNORMAL LOW (ref 8.9–10.3)
Chloride: 105 mmol/L (ref 98–111)
Creatinine, Ser: 1.98 mg/dL — ABNORMAL HIGH (ref 0.61–1.24)
GFR calc Af Amer: 40 mL/min — ABNORMAL LOW (ref >60)
GFR calc non Af Amer: 35 mL/min — ABNORMAL LOW (ref >60)
Glucose, Bld: 264 mg/dL — ABNORMAL HIGH (ref 70–99)
Potassium: 5.1 mmol/L (ref 3.5–5.1)
Sodium: 138 mmol/L (ref 135–145)
Total Bilirubin: 0.6 mg/dL (ref 0.3–1.2)
Total Protein: 5.6 g/dL — ABNORMAL LOW (ref 6.5–8.1)

## 2020-07-30 LAB — CBC
HCT: 31.7 % — ABNORMAL LOW (ref 39.0–52.0)
Hemoglobin: 10.2 g/dL — ABNORMAL LOW (ref 13.0–17.0)
MCH: 29.9 pg (ref 26.0–34.0)
MCHC: 32.2 g/dL (ref 30.0–36.0)
MCV: 93 fL (ref 80.0–100.0)
Platelets: 179 10*3/uL (ref 150–400)
RBC: 3.41 MIL/uL — ABNORMAL LOW (ref 4.22–5.81)
RDW: 13.8 % (ref 11.5–15.5)
WBC: 12.4 10*3/uL — ABNORMAL HIGH (ref 4.0–10.5)
nRBC: 0 % (ref 0.0–0.2)

## 2020-07-30 LAB — GLUCOSE, CAPILLARY
Glucose-Capillary: 325 mg/dL — ABNORMAL HIGH (ref 70–99)
Glucose-Capillary: 258 mg/dL — ABNORMAL HIGH (ref 70–99)
Glucose-Capillary: 193 mg/dL — ABNORMAL HIGH (ref 70–99)
Glucose-Capillary: 283 mg/dL — ABNORMAL HIGH (ref 70–99)

## 2020-07-30 LAB — ACTH STIMULATION, 3 TIME POINTS
Cortisol, 30 Min: 12.2 ug/dL
Cortisol, 60 Min: 9.3 ug/dL
Cortisol, Base: 1.8 ug/dL

## 2020-07-30 MED ORDER — INSULIN ASPART 100 UNIT/ML ~~LOC~~ SOLN
0.0000 [IU] | Freq: Every day | SUBCUTANEOUS | Status: DC
  Administered 2020-07-30 – 2020-07-31 (×2): 3 [IU] via SUBCUTANEOUS

## 2020-07-30 MED ORDER — INSULIN ASPART 100 UNIT/ML ~~LOC~~ SOLN
0.0000 [IU] | Freq: Three times a day (TID) | SUBCUTANEOUS | Status: DC
  Administered 2020-07-31: 4 [IU] via SUBCUTANEOUS
  Administered 2020-07-31: 11 [IU] via SUBCUTANEOUS
  Administered 2020-07-31: 4 [IU] via SUBCUTANEOUS
  Administered 2020-08-01: 7 [IU] via SUBCUTANEOUS
  Administered 2020-08-01: 4 [IU] via SUBCUTANEOUS

## 2020-07-30 MED ORDER — INSULIN GLARGINE 100 UNIT/ML ~~LOC~~ SOLN
30.0000 [IU] | Freq: Every day | SUBCUTANEOUS | Status: DC
  Administered 2020-07-31 – 2020-08-01 (×2): 30 [IU] via SUBCUTANEOUS
  Filled 2020-07-30 (×3): qty 0.3

## 2020-07-30 MED ORDER — INSULIN GLARGINE 100 UNIT/ML ~~LOC~~ SOLN
26.0000 [IU] | Freq: Every day | SUBCUTANEOUS | Status: DC
  Filled 2020-07-30 (×3): qty 0.26

## 2020-07-30 MED ORDER — HEPARIN (PORCINE) 25000 UT/250ML-% IV SOLN
900.0000 [IU]/h | INTRAVENOUS | Status: DC
  Administered 2020-07-31: 900 [IU]/h via INTRAVENOUS
  Filled 2020-07-30: qty 250

## 2020-07-30 NOTE — Progress Notes (Signed)
Patient Demographics:    Shawn Fleming, is a 64 y.o. male, DOB - 1956/01/09, WEX:[id number]  Admit date - 07/28/2020   Admitting Physician Illyria Sobocinski Denton Brick, MD  Outpatient Primary MD for the patient is [name], MD  LOS - 1   Chief Complaint  Patient presents with  . Hypotension  . Hyperglycemia        Subjective:    Shawn Fleming today has no fevers, no emesis,   -Dizziness and dyspnea on exertion improving -Right lower extremity swelling persist  No frank chest pains  Assessment  & Plan :    Principal Problem:   Rt DVT (deep venous thrombosis)/VTE Active Problems:   Hypotension   AKI (acute kidney injury) ([place])   Dehydration   Hyperglycemia   Bilateral leg edema   Essential hypertension   Type 2 diabetes mellitus with hyperlipidemia (HCC)   GERD (gastroesophageal reflux disease)   Migraine headache   Abnormal cortisol level  Brief Summary:- 64 y.o. male with medical history significant for hypertension, hyperlipidemia, migraine headache, GERD, sleep apnea (currently not using CPAP due to being unable to tolerate it), back pain and obesity who presents to the emergency department due to lightheadedness, dizziness and weakness admitted on 07/29/2019 for persistent hypotension, and found to have right lower extremity DVT  A/p 1)Rt LE DVT--started on IV heparin, suspect patient probably has PE however given creatinine above 2 unable to do CTA --Right lower extremity swelling persist  dyspnea on exertion persist, denies frank chest pains -Clinically cannot rule out possible PE will treat empirically -This appears to be unprovoked DVT/VTE patient will most likely need lifelong anticoagulation  2)Recent COVID-19 infection--- diagnosis April 2021, currently asymptomatic -Suspect patient may be hypercoagulable due to recent COVID-19 infection I reduced activity level post  Covid  3)Adrenal insufficiency--- Vs hypothalamic/Pituitary deficiency-- ACTH stimulation test --on 07/30/20 --baseline Cortisol 1.8, cortisol at 30 mins-12.2 and at 60 mins 9.3 -Patient has a subnormal response with a low baseline ACTH  Level--suggesting corticotropin releasing factor and/or ACTH deficiency of hypothalamic and or pituitary origin Also patient had a rise of 10 mg/dL of cortisol from his baseline making significant primary adrenal insufficiency less likely -Patient has been chronically on prednisone for the last 4 weeks prescribed by his PCP Dr. Cindie Fleming for back pain Okay to start hydrocortisone  -Patient will need further work-up and follow-up with endocrinologist Dr. Loni Fleming post discharge  4)Persistent Hypotension--- suspect related to #1 and #3 above --Bps improved  5)HFpEF--- echo with chronic diastolic dysfunction, EF 60 to  65%, appears compensated -Be judicious with IV fluids  6)AKI----acute kidney injury with hyperkalemia-   - creatinine on admission=  2.36 ,  baseline creatinine = none recently    , creatinine is now= 1.98,  renally adjust medications, avoid nephrotoxic agents / dehydration  / hypotension Received Lokelma -Hold lisinopril and hold Metformin  7)DM2-uncontrolled DM with hemoglobin A1c of 12.3, --Crease Lantus insulin to 30 units as we anticipate worsening glycemic control with steroid replacement for cortisol deficiency  =--Use Novolog/Humalog Sliding scale insulin with Accu-Cheks/Fingersticks as ordered   Disposition/Need for in-Hospital Stay- patient unable to be discharged at this time due to --- acute right lower extremity DVT requiring IV heparin, acute kidney injury requiring IV  fluids  Status is: Inpatient  Remains inpatient appropriate because:acute right lower extremity DVT requiring IV heparin, acute kidney injury requiring IV fluids  Disposition: The patient is from: Home              Anticipated d/c is to: Home               Anticipated d/c date is: 2 days              Patient currently is not medically stable to d/c. Barriers: Not Clinically Stable- -acute right lower extremity DVT requiring IV heparin, acute kidney injury requiring IV fluids  Code Status : Full   Family Communication:   (patient is alert, awake and coherent)   Consults  :  Full code  DVT Prophylaxis  :  Iv heparin - SCDs   Lab Results  Component Value Date   PLT 179 07/30/2020    Inpatient Medications  Scheduled Meds: . insulin aspart  0-15 Units Subcutaneous TID WC  . insulin aspart  0-5 Units Subcutaneous QHS  . [START ON 07/31/2020] insulin glargine  26 Units Subcutaneous Daily  . pantoprazole  40 mg Oral Daily  . pneumococcal 23 valent vaccine  0.5 mL Intramuscular Tomorrow-1000  . rosuvastatin  10 mg Oral Daily   Continuous Infusions: . heparin 1,000 Units/hr (07/30/20 1645)   PRN Meds:.acetaminophen **OR** acetaminophen   Anti-infectives (From admission, onward)   None       Objective:   Vitals:   07/30/20 0003 07/30/20 0526 07/30/20 1045 07/30/20 1500  BP: 113/74 122/83  130/77  Pulse: 93 79  91  Resp: 16 18  18   Temp: 98.7 F (37.1 C) 97.7 F (36.5 C)  98.5 F (36.9 C)  TempSrc:    Oral  SpO2: 96% 99%  97%  Weight:   114.4 kg   Height:   5\' 5"  (1.651 m)     Wt Readings from Last 3 Encounters:  07/30/20 114.4 kg  02/13/19 107.5 kg  07/31/16 107.5 kg    Intake/Output Summary (Last 24 hours) at 07/30/2020 1840 Last data filed at 07/30/2020 0600 Gross per 24 hour  Intake 185.58 ml  Output --  Net 185.58 ml    Physical Exam  Gen:- Awake Alert,  In no apparent distress  HEENT:- [place].AT, No sclera icterus Neck-Supple Neck,No JVD,.  Lungs-  CTAB , fair symmetrical air movement CV- S1, S2 normal, regular  Abd-  +ve B.Sounds, Abd Soft, No tenderness,    Extremity/Skin:-Bilateral lower extremity edema/swelling right more than left, pedal pulses present  Psych-affect is appropriate, oriented  x3 Neuro-no new focal deficits, no tremors   Data Review:   Micro Results Recent Results (from the past 240 hour(s))  SARS Coronavirus 2 by RT PCR (hospital order, performed in [facility] hospital lab) Nasopharyngeal Nasopharyngeal Swab     Status: None   Collection Time: 07/28/20  8:41 PM   Specimen: Nasopharyngeal Swab  Result Value Ref Range Status   SARS Coronavirus 2 NEGATIVE NEGATIVE Final    Comment: (NOTE) SARS-CoV-2 target nucleic acids are NOT DETECTED.  The SARS-CoV-2 RNA is generally detectable in upper and lower respiratory specimens during the acute phase of infection. The lowest concentration of SARS-CoV-2 viral copies this assay can detect is 250 copies / mL. A negative result does not preclude SARS-CoV-2 infection and should not be used as the sole basis for treatment or other patient management decisions.  A negative result may occur with improper specimen collection /  handling, submission of specimen other than nasopharyngeal swab, presence of viral mutation(s) within the areas targeted by this assay, and inadequate number of viral copies (<250 copies / mL). A negative result must be combined with clinical observations, patient history, and epidemiological information.  Fact Sheet for Patients:   StrictlyIdeas.no  Fact Sheet for Healthcare Providers: BankingDealers.co.za  This test is not yet approved or  cleared by the [place] FDA and has been authorized for detection and/or diagnosis of SARS-CoV-2 by FDA under an Emergency Use Authorization (EUA).  This EUA will remain in effect (meaning this test can be used) for the duration of the COVID-19 declaration under Section 564(b)(1) of the Act, 21 U.S.C. section 360bbb-3(b)(1), unless the authorization is terminated or revoked sooner.  Performed at [facility], [address], [place], [place] [address]     Radiology Reports US Venous Img Lower  Bilateral (DVT)  Result Date: 07/29/2020 CLINICAL DATA:  Lower extremity edema EXAM: BILATERAL LOWER EXTREMITY VENOUS DUPLEX ULTRASOUND TECHNIQUE: Gray-scale sonography with graded compression, as well as color Doppler and duplex ultrasound were performed to evaluate the lower extremity deep venous systems from the level of the common femoral vein and including the common femoral, femoral, profunda femoral, popliteal and calf veins including the posterior tibial, peroneal and gastrocnemius veins when visible. The superficial great saphenous vein was also interrogated. Spectral Doppler was utilized to evaluate flow at rest and with distal augmentation maneuvers in the common femoral, femoral and popliteal veins. COMPARISON:  None. FINDINGS: RIGHT LOWER EXTREMITY Common Femoral Vein: No evidence of thrombus. Normal compressibility, respiratory phasicity and response to augmentation. Saphenofemoral Junction: No evidence of thrombus. Normal compressibility and flow on color Doppler imaging. Profunda Femoral Vein: No evidence of thrombus. Normal compressibility and flow on color Doppler imaging. Femoral Vein: No evidence of thrombus. Normal compressibility, respiratory phasicity and response to augmentation. Popliteal Vein: No evidence of thrombus. Normal compressibility, respiratory phasicity and response to augmentation. Calf Veins: There is acute appearing thrombus in the right posterior tibial vein with absence of flow in this vessel. No compression of this vessel. Peroneal vein on the right appears patent. Superficial Great Saphenous Vein: No evidence of thrombus. Normal compressibility. Venous Reflux:  None. Other Findings:  None. LEFT LOWER EXTREMITY Common Femoral Vein: No evidence of thrombus. Normal compressibility, respiratory phasicity and response to augmentation. Saphenofemoral Junction: No evidence of thrombus. Normal compressibility and flow on color Doppler imaging. Profunda Femoral Vein: No evidence  of thrombus. Normal compressibility and flow on color Doppler imaging. Femoral Vein: No evidence of thrombus. Normal compressibility, respiratory phasicity and response to augmentation. Popliteal Vein: No evidence of thrombus. Normal compressibility, respiratory phasicity and response to augmentation. Calf Veins: No evidence of thrombus. Normal compressibility and flow on color Doppler imaging. Superficial Great Saphenous Vein: No evidence of thrombus. Normal compressibility. Venous Reflux:  None. Other Findings:  None. IMPRESSION: 1. Acute appearing deep venous thrombosis in the right posterior tibial vein. No deep venous thrombosis at or above the knee on the right noted. 2. No evident deep venous thrombosis in the left lower extremity venous system. Electronically Signed   By: Lowella Grip III M.D.   On: 07/29/2020 09:57   ECHOCARDIOGRAM COMPLETE  Result Date: 07/29/2020    ECHOCARDIOGRAM REPORT   Patient Name:   Iven Finn Date of Exam: 07/29/2020 Medical Rec #:  [id number]      Height:       65.0 in Accession #:    [id number]  Weight:       260.0 lb Date of Birth:  December 21, 1955     BSA:          2.211 m Patient Age:    38 years       BP:           101/64 mmHg Patient Gender: M              HR:           79 bpm. Exam Location:  [facility] Procedure: 2D Echo, Cardiac Doppler and Color Doppler Indications:    Abnormal ECG 794.31 / R94.31  History:        Patient has no prior history of Echocardiogram examinations.                 Risk Factors:Hypertension, Diabetes and Dyslipidemia. GERD,                 Obesity, Sleep apnea (From Hx).  Sonographer:    Alvino Chapel RCS Referring Phys: [phone] Dotti Busey IMPRESSIONS  1. Left ventricular ejection fraction, by estimation, is 60 to 65%. The left ventricle has normal function. The left ventricle has no regional wall motion abnormalities. Left ventricular diastolic parameters are consistent with Grade I diastolic dysfunction (impaired relaxation).  2.  Right ventricular systolic function is normal. The right ventricular size is normal.  3. The mitral valve is normal in structure. No evidence of mitral valve regurgitation. No evidence of mitral stenosis.  4. The aortic valve has an indeterminant number of cusps. Aortic valve regurgitation is not visualized. No aortic stenosis is present.  5. The inferior vena cava is normal in size with greater than 50% respiratory variability, suggesting right atrial pressure of 3 mmHg. FINDINGS  Left Ventricle: Left ventricular ejection fraction, by estimation, is 60 to 65%. The left ventricle has normal function. The left ventricle has no regional wall motion abnormalities. The left ventricular internal cavity size was normal in size. There is  no left ventricular hypertrophy. Left ventricular diastolic parameters are consistent with Grade I diastolic dysfunction (impaired relaxation). Normal left ventricular filling pressure. Right Ventricle: The right ventricular size is normal. No increase in right ventricular wall thickness. Right ventricular systolic function is normal. Left Atrium: Left atrial size was normal in size. Right Atrium: Right atrial size was normal in size. Pericardium: There is no evidence of pericardial effusion. Mitral Valve: The mitral valve is normal in structure. No evidence of mitral valve regurgitation. No evidence of mitral valve stenosis. Tricuspid Valve: The tricuspid valve is not well visualized. Tricuspid valve regurgitation is not demonstrated. No evidence of tricuspid stenosis. Aortic Valve: The aortic valve has an indeterminant number of cusps. Aortic valve regurgitation is not visualized. No aortic stenosis is present. Aortic valve mean gradient measures 4.3 mmHg. Aortic valve peak gradient measures 9.1 mmHg. Aortic valve area, by VTI measures 2.98 cm. Pulmonic Valve: The pulmonic valve was not well visualized. Pulmonic valve regurgitation is not visualized. No evidence of pulmonic stenosis.  Aorta: The aortic root is normal in size and structure. Pulmonary Artery: Indeterminant PASP, inadequate TR jet. Venous: The inferior vena cava is normal in size with greater than 50% respiratory variability, suggesting right atrial pressure of 3 mmHg. IAS/Shunts: No atrial level shunt detected by color flow Doppler.  LEFT VENTRICLE PLAX 2D LVIDd:         4.01 cm  Diastology LVIDs:         2.58 cm  LV e' lateral:  9.36 cm/s LV PW:         0.90 cm  LV E/e' lateral: 6.6 LV IVS:        0.85 cm  LV e' medial:    6.64 cm/s LVOT diam:     2.20 cm  LV E/e' medial:  9.4 LV SV:         76 LV SV Index:   34 LVOT Area:     3.80 cm  RIGHT VENTRICLE RV S prime:     15.60 cm/s TAPSE (M-mode): 1.9 cm LEFT ATRIUM             Index       RIGHT ATRIUM           Index LA diam:        3.60 cm 1.63 cm/m  RA Area:     10.70 cm LA Vol (A2C):   40.6 ml 18.36 ml/m RA Volume:   25.40 ml  11.49 ml/m LA Vol (A4C):   47.2 ml 21.34 ml/m LA Biplane Vol: 44.9 ml 20.30 ml/m  AORTIC VALVE AV Area (Vmax):    2.92 cm AV Area (Vmean):   2.70 cm AV Area (VTI):     2.98 cm AV Vmax:           150.83 cm/s AV Vmean:          97.555 cm/s AV VTI:            0.254 m AV Peak Grad:      9.1 mmHg AV Mean Grad:      4.3 mmHg LVOT Vmax:         116.00 cm/s LVOT Vmean:        69.200 cm/s LVOT VTI:          0.199 m LVOT/AV VTI ratio: 0.78  AORTA Ao Root diam: 3.10 cm MITRAL VALVE MV Area (PHT): 3.11 cm    SHUNTS MV Decel Time: 244 msec    Systemic VTI:  0.20 m MV E velocity: 62.20 cm/s  Systemic Diam: 2.20 cm MV A velocity: 83.60 cm/s MV E/A ratio:  0.74 Carlyle Dolly MD Electronically signed by Carlyle Dolly MD Signature Date/Time: 07/29/2020/1:27:00 PM    Final      CBC Recent Labs  Lab 07/28/20 1933 07/29/20 0517 07/30/20 0515  WBC 12.2* 10.9* 12.4*  HGB 10.6* 10.1* 10.2*  HCT 32.3* 30.9* 31.7*  PLT 182 179 179  MCV 92.6 92.2 93.0  MCH 30.4 30.1 29.9  MCHC 32.8 32.7 32.2  RDW 13.8 13.8 13.8  LYMPHSABS 2.0  --   --   MONOABS 0.7  --    --   EOSABS 0.4  --   --   BASOSABS 0.0  --   --     Chemistries  Recent Labs  Lab 07/28/20 1933 07/29/20 0517 07/30/20 0515  NA 136 133* 138  K 4.4 5.4* 5.1  CL 101 102 105  CO2 24 23 25   GLUCOSE 334* 374* 264*  BUN 50* 47* 39*  CREATININE 2.36* 2.18* 1.98*  CALCIUM 8.7* 8.8* 8.8*  MG  --  2.0  --   AST 10* 10* 9*  ALT 17 16 15   ALKPHOS 48 48 45  BILITOT 0.6 0.6 0.6   ------------------------------------------------------------------------------------------------------------------ No results for input(s): CHOL, HDL, LDLCALC, TRIG, CHOLHDL, LDLDIRECT in the last 72 hours.  Lab Results  Component Value Date   HGBA1C 12.3 (H) 07/28/2020   ------------------------------------------------------------------------------------------------------------------ Recent Labs    07/29/20 5726  TSH 0.622   ------------------------------------------------------------------------------------------------------------------ No results for input(s): VITAMINB12, FOLATE, FERRITIN, TIBC, IRON, RETICCTPCT in the last 72 hours.  Coagulation profile Recent Labs  Lab 07/29/20 0517  INR 1.0    No results for input(s): DDIMER in the last 72 hours.  Cardiac Enzymes No results for input(s): CKMB, TROPONINI, MYOGLOBIN in the last 168 hours.  Invalid input(s): CK ------------------------------------------------------------------------------------------------------------------ No results found for: BNP   Roxan Hockey M.D on 07/30/2020 at 6:40 PM  Go to [email or web address] - for contact info  [facility] - Office  [phone]

## 2020-07-30 NOTE — Progress Notes (Signed)
ANTICOAGULATION CONSULT NOTE - Initial Consult  Pharmacy Consult for Heparin Indication: DVT, right leg  No Known Allergies  Patient Measurements: Height: 5\' 5"  (165.1 cm) Weight: 114.4 kg (252 lb 4.8 oz) IBW/kg (Calculated) : 61.5 Heparin Dosing Weight: 92 kg  Vital Signs: Temp: 97.7 F (36.5 C) (08/13 0526) BP: 122/83 (08/13 0526) Pulse Rate: 79 (08/13 0526)  Labs: Recent Labs    07/28/20 1933 07/28/20 1933 07/29/20 0517 07/30/20 0515 07/30/20 1348  HGB 10.6*   < > 10.1* 10.2*  --   HCT 32.3*  --  30.9* 31.7*  --   PLT 182  --  179 179  --   APTT  --   --  26  --   --   LABPROT  --   --  13.0  --   --   INR  --   --  1.0  --   --   HEPARINUNFRC  --   --   --  0.98* 1.32*  CREATININE 2.36*  --  2.18* 1.98*  --    < > = values in this interval not displayed.    Estimated Creatinine Clearance: 44.1 mL/min (A) (by C-G formula based on SCr of 1.98 mg/dL (H)).   Medical History: Past Medical History:  Diagnosis Date  . AKI (acute kidney injury) ([place]) 07/29/2020  . Arthritis   . GERD (gastroesophageal reflux disease)   . History of kidney stones   . Hypercholesteremia   . Hypertension   . Migraine   . Sleep apnea    Assessment:  64 yr old male to begin IV heparin for right leg DVT per duplex.  Has been on SQ heparin for VTE prophylaxis, last dose at 2pm today.   AKI, CBC stable.  Not on anticoagulation PTA.  07/30/20 1500 update: Heparin level: 1.32 IU/mL, above goal range on heparin at 1250 units/hr CBC: Hb 10.2 (low, stable) RN reports no signs of bleeding and no issues with infusion site at this time  Goal of Therapy:  Heparin level 0.3-0.7 units/ml Monitor platelets by anticoagulation protocol: Yes   Plan:    Hold heparin infusion x1 hour  Re-start  heparin infusion at 1600 and decrease rate to  1000 units/hr  Re-check heparin level ~6-8 hrs after rate change  Daily heparin level and CBC  Monitor for s/sx bleeding.    Despina Pole, RPh   07/30/2020,3:00 PM

## 2020-07-30 NOTE — Progress Notes (Signed)
ANTICOAGULATION CONSULT NOTE - Initial Consult  Pharmacy Consult for Heparin Indication: DVT, right leg  No Known Allergies  Patient Measurements: Height: 5\' 5"  (165.1 cm) Weight: 117.9 kg (260 lb) IBW/kg (Calculated) : 61.5 Heparin Dosing Weight: 92 kg  Vital Signs: Temp: 97.7 F (36.5 C) (08/13 0526) BP: 122/83 (08/13 0526) Pulse Rate: 79 (08/13 0526)  Labs: Recent Labs    07/28/20 1933 07/28/20 1933 07/29/20 0517 07/30/20 0515  HGB 10.6*   < > 10.1* 10.2*  HCT 32.3*  --  30.9* 31.7*  PLT 182  --  179 179  APTT  --   --  26  --   LABPROT  --   --  13.0  --   INR  --   --  1.0  --   HEPARINUNFRC  --   --   --  0.98*  CREATININE 2.36*  --  2.18* 1.98*   < > = values in this interval not displayed.    Estimated Creatinine Clearance: 44.8 mL/min (A) (by C-G formula based on SCr of 1.98 mg/dL (H)).   Medical History: Past Medical History:  Diagnosis Date  . AKI (acute kidney injury) ([place]) 07/29/2020  . Arthritis   . GERD (gastroesophageal reflux disease)   . History of kidney stones   . Hypercholesteremia   . Hypertension   . Migraine   . Sleep apnea    Assessment:  64 yr old male to begin IV heparin for right leg DVT per duplex.  Has been on SQ heparin for VTE prophylaxis, last dose at 2pm today.   AKI, CBC stable.  Not on anticoagulation PTA.  07/30/20 0730 update: Heparin level: 0.98 IU/mL, above goal range on heparin at 1450 units/hr CBC: Hb 10.2 (low, stable) RN reports no signs of bleeding and no issues with infusion site at this time  Goal of Therapy:  Heparin level 0.3-0.7 units/ml Monitor platelets by anticoagulation protocol: Yes   Plan:    Reduce heparin infusion rate to  1250 units/hr  Re-check heparin level ~6-8 hrs after rate change  Daily heparin level and CBC  Monitor for s/sx bleeding.  Despina Pole, RPh   07/30/2020,8:54 AM

## 2020-07-30 NOTE — Progress Notes (Signed)
ANTICOAGULATION CONSULT NOTE   Pharmacy Consult for Heparin Indication: DVT, right leg  No Known Allergies  Patient Measurements: Height: 5\' 5"  (165.1 cm) Weight: 114.4 kg (252 lb 4.8 oz) IBW/kg (Calculated) : 61.5 Heparin Dosing Weight: 92 kg  Vital Signs: Temp: 98 F (36.7 C) (08/13 2010) Temp Source: Oral (08/13 1500) BP: 109/66 (08/13 2010) Pulse Rate: 81 (08/13 2010)  Labs: Recent Labs    07/28/20 1933 07/28/20 1933 07/29/20 0517 07/30/20 0515 07/30/20 1348 07/30/20 2236  HGB 10.6*   < > 10.1* 10.2*  --   --   HCT 32.3*  --  30.9* 31.7*  --   --   PLT 182  --  179 179  --   --   APTT  --   --  26  --   --   --   LABPROT  --   --  13.0  --   --   --   INR  --   --  1.0  --   --   --   HEPARINUNFRC  --   --   --  0.98* 1.32* 0.75*  CREATININE 2.36*  --  2.18* 1.98*  --   --    < > = values in this interval not displayed.    Estimated Creatinine Clearance: 44.1 mL/min (A) (by C-G formula based on SCr of 1.98 mg/dL (H)).   Medical History: Past Medical History:  Diagnosis Date  . AKI (acute kidney injury) ([place]) 07/29/2020  . Arthritis   . GERD (gastroesophageal reflux disease)   . History of kidney stones   . Hypercholesteremia   . Hypertension   . Migraine   . Sleep apnea    Assessment:  64 yr old male to begin IV heparin for right leg DVT per duplex.  Has been on SQ heparin for VTE prophylaxis, last dose at 2pm today.   AKI, CBC stable.  Not on anticoagulation PTA.  8/13 PM update:  -Heparin level elevated but trending down  Goal of Therapy:  Heparin level 0.3-0.7 units/ml Monitor platelets by anticoagulation protocol: Yes   Plan:  Dec heparin to 900 units/hr Re-check heparin level with AM labs  Narda Bonds, PharmD, [place] Pharmacist Phone: [phone]

## 2020-07-30 NOTE — Progress Notes (Addendum)
Inpatient Diabetes Program Recommendations  AACE/ADA: New Consensus Statement on Inpatient Glycemic Control (2015)  Target Ranges:  Prepandial:   less than 140 mg/dL      Peak postprandial:   less than 180 mg/dL (1-2 hours)      Critically ill patients:  140 - 180 mg/dL   Lab Results  Component Value Date   GLUCAP 325 (H) 07/30/2020   HGBA1C 12.3 (H) 07/28/2020    Review of Glycemic Control Results for Shawn Fleming, Shawn Fleming (MRN [id number]) as of 07/30/2020 10:50  Ref. Range 07/29/2020 07:28 07/29/2020 11:55 07/29/2020 17:06 07/29/2020 20:53 07/30/2020 07:35  Glucose-Capillary Latest Ref Range: 70 - 99 mg/dL 344 (H) 286 (H) 360 (H) 308 (H) 325 (H)   Diabetes history: DM2  Outpatient Diabetes medications:  Lantus 22 units daily (Has been taking 28 units while on steroids) Metformin 500 BID Prednisone 20 mg TID  Current orders for Inpatient glycemic control:  Novolog 0-15 units TID Novolog 0-5 units QHS Lantus 22 units QD  Inpatient Diabetes Program Recommendations:     Lantus 25 units daily  Novolog 0-20 TID  Addendum @ 1250-Attempted to call patient and speak about A1C.  No answer; will try again later.  Addendum@ 1335-Spoke with patient via phone.  Reviewed patient's current A1c of 12.3%. Explained what a A1c is and what it measures. Also reviewed goal A1c with patient, importance of good glucose control @ home, and blood sugar goals.  He has been on Prednisone at home for back pain which has elevated his blood sugars despite increasing Lantus dose at home to 26-28 daily.  He checks his blood sugars at least 1 x a day.  Has an occasional Mello yellow but otherwise, does not drink any other sugary beverages.  He is unsure of what his last A1C was.  Normally when he checks his blood sugar his meter reads 100-180 mg/dl.  MD discontinued steroid.  Denies difficulties obtaining insulins or supplies. All questions answered.    Will continue to follow while inpatient.  Thank you, Reche Dixon,  RN, BSN Diabetes Coordinator Inpatient Diabetes Program [phone] (team pager from 8a-5p)

## 2020-07-31 LAB — HEPARIN LEVEL (UNFRACTIONATED): Heparin Unfractionated: 0.49 IU/mL (ref 0.30–0.70)

## 2020-07-31 LAB — CBC
HCT: 33.8 % — ABNORMAL LOW (ref 39.0–52.0)
Hemoglobin: 10.8 g/dL — ABNORMAL LOW (ref 13.0–17.0)
MCH: 29.8 pg (ref 26.0–34.0)
MCHC: 32 g/dL (ref 30.0–36.0)
MCV: 93.1 fL (ref 80.0–100.0)
Platelets: 198 10*3/uL (ref 150–400)
RBC: 3.63 MIL/uL — ABNORMAL LOW (ref 4.22–5.81)
RDW: 14 % (ref 11.5–15.5)
WBC: 8.7 10*3/uL (ref 4.0–10.5)
nRBC: 0 % (ref 0.0–0.2)

## 2020-07-31 LAB — GLUCOSE, CAPILLARY
Glucose-Capillary: 190 mg/dL — ABNORMAL HIGH (ref 70–99)
Glucose-Capillary: 264 mg/dL — ABNORMAL HIGH (ref 70–99)
Glucose-Capillary: 164 mg/dL — ABNORMAL HIGH (ref 70–99)
Glucose-Capillary: 274 mg/dL — ABNORMAL HIGH (ref 70–99)

## 2020-07-31 MED ORDER — HYDROCORTISONE NA SUCCINATE PF 100 MG IJ SOLR
50.0000 mg | Freq: Two times a day (BID) | INTRAMUSCULAR | Status: AC
  Administered 2020-07-31 (×2): 50 mg via INTRAVENOUS
  Filled 2020-07-31 (×2): qty 2

## 2020-07-31 MED ORDER — HYDROCORTISONE 5 MG PO TABS
5.0000 mg | ORAL_TABLET | Freq: Every evening | ORAL | Status: DC
  Filled 2020-07-31: qty 1

## 2020-07-31 MED ORDER — HYDROCORTISONE NA SUCCINATE PF 100 MG IJ SOLR: 50.0000 mg | Freq: Three times a day (TID) | INTRAMUSCULAR | Status: DC

## 2020-07-31 MED ORDER — SODIUM CHLORIDE 0.9 % IV SOLN: INTRAVENOUS | Status: DC

## 2020-07-31 MED ORDER — HYDROCORTISONE 10 MG PO TABS
10.0000 mg | ORAL_TABLET | Freq: Every day | ORAL | Status: DC
  Administered 2020-08-01: 10 mg via ORAL
  Filled 2020-07-31 (×2): qty 1

## 2020-07-31 NOTE — Progress Notes (Signed)
Patient Demographics:    Shawn Fleming, is a 64 y.o. male, DOB - May 03, 1956, KNL:[id number]  Admit date - 07/28/2020   Admitting Physician [name] Denton Brick, MD  Outpatient Primary MD for the patient is [name], MD  LOS - 2   Chief Complaint  Patient presents with  . Hypotension  . Hyperglycemia        Subjective:    Shawn Fleming today has no fevers, no emesis,   -Dizziness and dyspnea on exertion improving -Right lower extremity swelling persist   -Voiding well, no hematuria -Denies chest pains -Had BM without blood  Assessment  & Plan :    Principal Problem:   Rt DVT (deep venous thrombosis)/VTE Active Problems:   Hypotension   AKI (acute kidney injury) ([facility])   Dehydration   Hyperglycemia   Bilateral leg edema   Essential hypertension   Type 2 diabetes mellitus with hyperlipidemia (HCC)   GERD (gastroesophageal reflux disease)   Migraine headache   Abnormal cortisol level  Brief Summary:- 65 y.o. male with medical history significant for hypertension, hyperlipidemia, migraine headache, GERD, sleep apnea (currently not using CPAP due to being unable to tolerate it), back pain and obesity who presents to the emergency department due to lightheadedness, dizziness and weakness admitted on 07/29/2019 for persistent hypotension, and found to have right lower extremity DVT  A/p 1)Rt LE DVT--started on IV heparin, suspect patient probably has PE however given creatinine above 2 unable to do CTA --Right lower extremity swelling persist  dyspnea on exertion persist, denies frank chest pains -Clinically cannot rule out possible PE will treat empirically -This appears to be unprovoked DVT/VTE patient will most likely need lifelong anticoagulation -Possible discharge home on Eliquis on 08/01/2020 if renal function improves  2)Recent COVID-19 infection--- diagnosis April 2021, currently  asymptomatic -Suspect patient may be hypercoagulable due to recent COVID-19 infection I reduced activity level post Covid  3)Adrenal insufficiency--- Vs hypothalamic/Pituitary deficiency-- ACTH stimulation test --on 07/30/20 --baseline Cortisol 1.8, cortisol at 30 mins-12.2 and at 60 mins 9.3 -Patient has a subnormal response with a low baseline ACTH  Level--suggesting corticotropin releasing factor and/or ACTH deficiency of hypothalamic and or pituitary origin Also patient had a rise of 10 mg/dL of cortisol from his baseline making significant primary adrenal insufficiency less likely -Patient has been chronically on prednisone for the last 4 weeks prescribed by his PCP Dr. Cindie Laroche for back pain Okay to start hydrocortisone  -Patient will need further work-up and follow-up with endocrinologist Dr. Loni Beckwith post discharge  4)Persistent Hypotension--- suspect related to #1 and #3 above --Bps improved -Hydrocortisone as above #3  5)HFpEF--- echo with chronic diastolic dysfunction, EF 60 to  65%, appears compensated -Be judicious with IV fluids  6)AKI----acute kidney injury with hyperkalemia-   - creatinine on admission=  2.36 ,  baseline creatinine = none recently    , creatinine is now= 1.98,  renally adjust medications, avoid nephrotoxic agents / dehydration  / hypotension Received Lokelma -Hold lisinopril and hold Metformin   7)DM2-uncontrolled DM with hemoglobin A1c of 12.3, --Crease Lantus insulin to 30 units as we anticipate worsening glycemic control with steroid replacement for cortisol deficiency  =--Use Novolog/Humalog Sliding scale insulin with Accu-Cheks/Fingersticks as ordered   Disposition/Need for in-Hospital Stay-  patient unable to be discharged at this time due to --- acute right lower extremity DVT requiring IV heparin, acute kidney injury requiring IV fluids --Possible discharge home on Eliquis on 08/01/2020 if renal function improves  Status is:  Inpatient  Remains inpatient appropriate because:acute right lower extremity DVT requiring IV heparin, acute kidney injury requiring IV fluids  Disposition: The patient is from: Home              Anticipated d/c is to: Home              Anticipated d/c date is: 2 days              Patient currently is not medically stable to d/c. Barriers: Not Clinically Stable- -acute right lower extremity DVT requiring IV heparin, acute kidney injury requiring IV fluids --Possible discharge home on Eliquis on 08/01/2020 if renal function improves Code Status : Full   Family Communication:   (patient is alert, awake and coherent)   Consults  :  Full code  DVT Prophylaxis  :  Iv heparin - SCDs   Lab Results  Component Value Date   PLT 198 07/31/2020    Inpatient Medications  Scheduled Meds: . [START ON 08/01/2020] hydrocortisone  10 mg Oral Daily  . [START ON 08/01/2020] hydrocortisone  5 mg Oral QPM  . hydrocortisone sod succinate (SOLU-CORTEF) inj  50 mg Intravenous Q12H  . insulin aspart  0-20 Units Subcutaneous TID WC  . insulin aspart  0-5 Units Subcutaneous QHS  . insulin glargine  30 Units Subcutaneous Daily  . pantoprazole  40 mg Oral Daily  . pneumococcal 23 valent vaccine  0.5 mL Intramuscular Tomorrow-1000  . rosuvastatin  10 mg Oral Daily   Continuous Infusions: . sodium chloride 125 mL/hr at 07/31/20 1126  . heparin 900 Units/hr (07/31/20 1811)   PRN Meds:.acetaminophen **OR** acetaminophen   Anti-infectives (From admission, onward)   None       Objective:   Vitals:   07/30/20 1500 07/30/20 2010 07/31/20 0521 07/31/20 1541  BP: 130/77 109/66 109/69 105/79  Pulse: 91 81 80 77  Resp: 18 18 16    Temp: 98.5 F (36.9 C) 98 F (36.7 C) 98.2 F (36.8 C) 97.7 F (36.5 C)  TempSrc: Oral   Oral  SpO2: 97% 99% 98% 98%  Weight:      Height:        Wt Readings from Last 3 Encounters:  07/30/20 114.4 kg  02/13/19 107.5 kg  07/31/16 107.5 kg    Intake/Output Summary  (Last 24 hours) at 07/31/2020 1951 Last data filed at 07/31/2020 1700 Gross per 24 hour  Intake 1295.64 ml  Output --  Net 1295.64 ml    Physical Exam  Gen:- Awake Alert,  In no apparent distress  HEENT:- [place].AT, No sclera icterus Neck-Supple Neck,No JVD,.  Lungs-  CTAB , fair symmetrical air movement CV- S1, S2 normal, regular  Abd-  +ve B.Sounds, Abd Soft, No tenderness,    Extremity/Skin:-Bilateral lower extremity edema/swelling right more than left, pedal pulses present  Psych-affect is appropriate, oriented x3 Neuro-no new focal deficits, no tremors   Data Review:   Micro Results Recent Results (from the past 240 hour(s))  SARS Coronavirus 2 by RT PCR (hospital order, performed in [facility] hospital lab) Nasopharyngeal Nasopharyngeal Swab     Status: None   Collection Time: 07/28/20  8:41 PM   Specimen: Nasopharyngeal Swab  Result Value Ref Range Status   SARS Coronavirus 2 NEGATIVE  NEGATIVE Final    Comment: (NOTE) SARS-CoV-2 target nucleic acids are NOT DETECTED.  The SARS-CoV-2 RNA is generally detectable in upper and lower respiratory specimens during the acute phase of infection. The lowest concentration of SARS-CoV-2 viral copies this assay can detect is 250 copies / mL. A negative result does not preclude SARS-CoV-2 infection and should not be used as the sole basis for treatment or other patient management decisions.  A negative result may occur with improper specimen collection / handling, submission of specimen other than nasopharyngeal swab, presence of viral mutation(s) within the areas targeted by this assay, and inadequate number of viral copies (<250 copies / mL). A negative result must be combined with clinical observations, patient history, and epidemiological information.  Fact Sheet for Patients:   StrictlyIdeas.no  Fact Sheet for Healthcare Providers: BankingDealers.co.za  This test is not yet  approved or  cleared by the [place] FDA and has been authorized for detection and/or diagnosis of SARS-CoV-2 by FDA under an Emergency Use Authorization (EUA).  This EUA will remain in effect (meaning this test can be used) for the duration of the COVID-19 declaration under Section 564(b)(1) of the Act, 21 U.S.C. section 360bbb-3(b)(1), unless the authorization is terminated or revoked sooner.  Performed at [facility], [address] [place], [place] [address]     Radiology Reports US Venous Img Lower Bilateral (DVT)  Result Date: 07/29/2020 CLINICAL DATA:  Lower extremity edema EXAM: BILATERAL LOWER EXTREMITY VENOUS DUPLEX ULTRASOUND TECHNIQUE: Gray-scale sonography with graded compression, as well as color Doppler and duplex ultrasound were performed to evaluate the lower extremity deep venous systems from the level of the common femoral vein and including the common femoral, femoral, profunda femoral, popliteal and calf veins including the posterior tibial, peroneal and gastrocnemius veins when visible. The superficial great saphenous vein was also interrogated. Spectral Doppler was utilized to evaluate flow at rest and with distal augmentation maneuvers in the common femoral, femoral and popliteal veins. COMPARISON:  None. FINDINGS: RIGHT LOWER EXTREMITY Common Femoral Vein: No evidence of thrombus. Normal compressibility, respiratory phasicity and response to augmentation. Saphenofemoral Junction: No evidence of thrombus. Normal compressibility and flow on color Doppler imaging. Profunda Femoral Vein: No evidence of thrombus. Normal compressibility and flow on color Doppler imaging. Femoral Vein: No evidence of thrombus. Normal compressibility, respiratory phasicity and response to augmentation. Popliteal Vein: No evidence of thrombus. Normal compressibility, respiratory phasicity and response to augmentation. Calf Veins: There is acute appearing thrombus in the right posterior tibial  vein with absence of flow in this vessel. No compression of this vessel. Peroneal vein on the right appears patent. Superficial Great Saphenous Vein: No evidence of thrombus. Normal compressibility. Venous Reflux:  None. Other Findings:  None. LEFT LOWER EXTREMITY Common Femoral Vein: No evidence of thrombus. Normal compressibility, respiratory phasicity and response to augmentation. Saphenofemoral Junction: No evidence of thrombus. Normal compressibility and flow on color Doppler imaging. Profunda Femoral Vein: No evidence of thrombus. Normal compressibility and flow on color Doppler imaging. Femoral Vein: No evidence of thrombus. Normal compressibility, respiratory phasicity and response to augmentation. Popliteal Vein: No evidence of thrombus. Normal compressibility, respiratory phasicity and response to augmentation. Calf Veins: No evidence of thrombus. Normal compressibility and flow on color Doppler imaging. Superficial Great Saphenous Vein: No evidence of thrombus. Normal compressibility. Venous Reflux:  None. Other Findings:  None. IMPRESSION: 1. Acute appearing deep venous thrombosis in the right posterior tibial vein. No deep venous thrombosis at or above the knee on the  right noted. 2. No evident deep venous thrombosis in the left lower extremity venous system. Electronically Signed   By: Lowella Grip III M.D.   On: 07/29/2020 09:57   ECHOCARDIOGRAM COMPLETE  Result Date: 07/29/2020    ECHOCARDIOGRAM REPORT   Patient Name:   Shawn Fleming Date of Exam: 07/29/2020 Medical Rec #:  [id number]      Height:       65.0 in Accession #:    [id number]     Weight:       260.0 lb Date of Birth:  07-04-1956     BSA:          2.211 m Patient Age:    37 years       BP:           101/64 mmHg Patient Gender: M              HR:           79 bpm. Exam Location:  [facility] Procedure: 2D Echo, Cardiac Doppler and Color Doppler Indications:    Abnormal ECG 794.31 / R94.31  History:        Patient has no prior  history of Echocardiogram examinations.                 Risk Factors:Hypertension, Diabetes and Dyslipidemia. GERD,                 Obesity, Sleep apnea (From Hx).  Sonographer:    Alvino Chapel RCS Referring Phys: [phone] Eero Dini IMPRESSIONS  1. Left ventricular ejection fraction, by estimation, is 60 to 65%. The left ventricle has normal function. The left ventricle has no regional wall motion abnormalities. Left ventricular diastolic parameters are consistent with Grade I diastolic dysfunction (impaired relaxation).  2. Right ventricular systolic function is normal. The right ventricular size is normal.  3. The mitral valve is normal in structure. No evidence of mitral valve regurgitation. No evidence of mitral stenosis.  4. The aortic valve has an indeterminant number of cusps. Aortic valve regurgitation is not visualized. No aortic stenosis is present.  5. The inferior vena cava is normal in size with greater than 50% respiratory variability, suggesting right atrial pressure of 3 mmHg. FINDINGS  Left Ventricle: Left ventricular ejection fraction, by estimation, is 60 to 65%. The left ventricle has normal function. The left ventricle has no regional wall motion abnormalities. The left ventricular internal cavity size was normal in size. There is  no left ventricular hypertrophy. Left ventricular diastolic parameters are consistent with Grade I diastolic dysfunction (impaired relaxation). Normal left ventricular filling pressure. Right Ventricle: The right ventricular size is normal. No increase in right ventricular wall thickness. Right ventricular systolic function is normal. Left Atrium: Left atrial size was normal in size. Right Atrium: Right atrial size was normal in size. Pericardium: There is no evidence of pericardial effusion. Mitral Valve: The mitral valve is normal in structure. No evidence of mitral valve regurgitation. No evidence of mitral valve stenosis. Tricuspid Valve: The tricuspid valve  is not well visualized. Tricuspid valve regurgitation is not demonstrated. No evidence of tricuspid stenosis. Aortic Valve: The aortic valve has an indeterminant number of cusps. Aortic valve regurgitation is not visualized. No aortic stenosis is present. Aortic valve mean gradient measures 4.3 mmHg. Aortic valve peak gradient measures 9.1 mmHg. Aortic valve area, by VTI measures 2.98 cm. Pulmonic Valve: The pulmonic valve was not well visualized. Pulmonic valve regurgitation is not visualized. No evidence  of pulmonic stenosis. Aorta: The aortic root is normal in size and structure. Pulmonary Artery: Indeterminant PASP, inadequate TR jet. Venous: The inferior vena cava is normal in size with greater than 50% respiratory variability, suggesting right atrial pressure of 3 mmHg. IAS/Shunts: No atrial level shunt detected by color flow Doppler.  LEFT VENTRICLE PLAX 2D LVIDd:         4.01 cm  Diastology LVIDs:         2.58 cm  LV e' lateral:   9.36 cm/s LV PW:         0.90 cm  LV E/e' lateral: 6.6 LV IVS:        0.85 cm  LV e' medial:    6.64 cm/s LVOT diam:     2.20 cm  LV E/e' medial:  9.4 LV SV:         76 LV SV Index:   34 LVOT Area:     3.80 cm  RIGHT VENTRICLE RV S prime:     15.60 cm/s TAPSE (M-mode): 1.9 cm LEFT ATRIUM             Index       RIGHT ATRIUM           Index LA diam:        3.60 cm 1.63 cm/m  RA Area:     10.70 cm LA Vol (A2C):   40.6 ml 18.36 ml/m RA Volume:   25.40 ml  11.49 ml/m LA Vol (A4C):   47.2 ml 21.34 ml/m LA Biplane Vol: 44.9 ml 20.30 ml/m  AORTIC VALVE AV Area (Vmax):    2.92 cm AV Area (Vmean):   2.70 cm AV Area (VTI):     2.98 cm AV Vmax:           150.83 cm/s AV Vmean:          97.555 cm/s AV VTI:            0.254 m AV Peak Grad:      9.1 mmHg AV Mean Grad:      4.3 mmHg LVOT Vmax:         116.00 cm/s LVOT Vmean:        69.200 cm/s LVOT VTI:          0.199 m LVOT/AV VTI ratio: 0.78  AORTA Ao Root diam: 3.10 cm MITRAL VALVE MV Area (PHT): 3.11 cm    SHUNTS MV Decel Time: 244  msec    Systemic VTI:  0.20 m MV E velocity: 62.20 cm/s  Systemic Diam: 2.20 cm MV A velocity: 83.60 cm/s MV E/A ratio:  0.74 Carlyle Dolly MD Electronically signed by Carlyle Dolly MD Signature Date/Time: 07/29/2020/1:27:00 PM    Final      CBC Recent Labs  Lab 07/28/20 1933 07/29/20 0517 07/30/20 0515 07/31/20 0555  WBC 12.2* 10.9* 12.4* 8.7  HGB 10.6* 10.1* 10.2* 10.8*  HCT 32.3* 30.9* 31.7* 33.8*  PLT 182 179 179 198  MCV 92.6 92.2 93.0 93.1  MCH 30.4 30.1 29.9 29.8  MCHC 32.8 32.7 32.2 32.0  RDW 13.8 13.8 13.8 14.0  LYMPHSABS 2.0  --   --   --   MONOABS 0.7  --   --   --   EOSABS 0.4  --   --   --   BASOSABS 0.0  --   --   --     Chemistries  Recent Labs  Lab 07/28/20 1933 07/29/20 0517 07/30/20 0515  NA  136 133* 138  K 4.4 5.4* 5.1  CL 101 102 105  CO2 24 23 25   GLUCOSE 334* 374* 264*  BUN 50* 47* 39*  CREATININE 2.36* 2.18* 1.98*  CALCIUM 8.7* 8.8* 8.8*  MG  --  2.0  --   AST 10* 10* 9*  ALT 17 16 15   ALKPHOS 48 48 45  BILITOT 0.6 0.6 0.6   ------------------------------------------------------------------------------------------------------------------ No results for input(s): CHOL, HDL, LDLCALC, TRIG, CHOLHDL, LDLDIRECT in the last 72 hours.  Lab Results  Component Value Date   HGBA1C 12.3 (H) 07/28/2020   ------------------------------------------------------------------------------------------------------------------ Recent Labs    07/29/20 0517  TSH 0.622   ------------------------------------------------------------------------------------------------------------------ No results for input(s): VITAMINB12, FOLATE, FERRITIN, TIBC, IRON, RETICCTPCT in the last 72 hours.  Coagulation profile Recent Labs  Lab 07/29/20 0517  INR 1.0    No results for input(s): DDIMER in the last 72 hours.  Cardiac Enzymes No results for input(s): CKMB, TROPONINI, MYOGLOBIN in the last 168 hours.  Invalid input(s):  CK ------------------------------------------------------------------------------------------------------------------ No results found for: BNP   Roxan Hockey M.D on 07/31/2020 at 7:51 PM  Go to [email or web address] - for contact info  [facility] - Office  [phone]

## 2020-07-31 NOTE — Progress Notes (Signed)
ANTICOAGULATION CONSULT NOTE   Pharmacy Consult for Heparin Indication: DVT, right leg  No Known Allergies  Patient Measurements: Height: 5\' 5"  (165.1 cm) Weight: 114.4 kg (252 lb 4.8 oz) IBW/kg (Calculated) : 61.5 Heparin Dosing Weight: 92 kg  Vital Signs: Temp: 98.2 F (36.8 C) (08/14 0521) BP: 109/69 (08/14 0521) Pulse Rate: 80 (08/14 0521)  Labs: Recent Labs    07/28/20 1933 07/28/20 1933 07/29/20 0517 07/29/20 0517 07/30/20 0515 07/30/20 0515 07/30/20 1348 07/30/20 2236 07/31/20 0555  HGB 10.6*   < > 10.1*   < > 10.2*  --   --   --  10.8*  HCT 32.3*   < > 30.9*  --  31.7*  --   --   --  33.8*  PLT 182   < > 179  --  179  --   --   --  198  APTT  --   --  26  --   --   --   --   --   --   LABPROT  --   --  13.0  --   --   --   --   --   --   INR  --   --  1.0  --   --   --   --   --   --   HEPARINUNFRC  --   --   --   --  0.98*   < > 1.32* 0.75* 0.49  CREATININE 2.36*  --  2.18*  --  1.98*  --   --   --   --    < > = values in this interval not displayed.    Estimated Creatinine Clearance: 44.1 mL/min (A) (by C-G formula based on SCr of 1.98 mg/dL (H)).   Assessment:  64 yr old male to begin IV heparin for right leg DVT per duplex.  Has been on SQ heparin for VTE prophylaxis, last dose at 2pm today.   AKI, CBC stable.  Not on anticoagulation PTA.  8/14 AM update:  Heparin level:  0.49 IU/mL, within therapeutic goal range on heparin at 900 units/hr CBC: Hb 10.8 (low, stable)   Plates: 179>198 RN noted some bleeding from IV site yesterday afternoon, but resolved now.  Goal of Therapy:  Heparin level 0.3-0.7 units/ml Monitor platelets by anticoagulation protocol: Yes   Plan:  Continue heparin infusion at  900 units/hr Check heparin level and CBC daily Monitor patient for signs and symptoms of bleeding.  Despina Pole, Pharm. D. Clinical Pharmacist 07/31/2020 8:50 AM

## 2020-08-01 DIAGNOSIS — R600 Localized edema: Secondary | ICD-10-CM

## 2020-08-01 LAB — CBC
HCT: 33.7 % — ABNORMAL LOW (ref 39.0–52.0)
Hemoglobin: 10.8 g/dL — ABNORMAL LOW (ref 13.0–17.0)
MCH: 30 pg (ref 26.0–34.0)
MCHC: 32 g/dL (ref 30.0–36.0)
MCV: 93.6 fL (ref 80.0–100.0)
Platelets: 189 10*3/uL (ref 150–400)
RBC: 3.6 MIL/uL — ABNORMAL LOW (ref 4.22–5.81)
RDW: 13.7 % (ref 11.5–15.5)
WBC: 10.3 10*3/uL (ref 4.0–10.5)
nRBC: 0 % (ref 0.0–0.2)

## 2020-08-01 LAB — HEPARIN LEVEL (UNFRACTIONATED): Heparin Unfractionated: 0.21 IU/mL — ABNORMAL LOW (ref 0.30–0.70)

## 2020-08-01 LAB — BASIC METABOLIC PANEL
Anion gap: 6 (ref 5–15)
BUN: 21 mg/dL (ref 8–23)
CO2: 26 mmol/L (ref 22–32)
Calcium: 8 mg/dL — ABNORMAL LOW (ref 8.9–10.3)
Chloride: 105 mmol/L (ref 98–111)
Creatinine, Ser: 1.44 mg/dL — ABNORMAL HIGH (ref 0.61–1.24)
GFR calc Af Amer: 59 mL/min — ABNORMAL LOW (ref >60)
GFR calc non Af Amer: 51 mL/min — ABNORMAL LOW (ref >60)
Glucose, Bld: 235 mg/dL — ABNORMAL HIGH (ref 70–99)
Potassium: 4 mmol/L (ref 3.5–5.1)
Sodium: 137 mmol/L (ref 135–145)

## 2020-08-01 LAB — GLUCOSE, CAPILLARY
Glucose-Capillary: 222 mg/dL — ABNORMAL HIGH (ref 70–99)
Glucose-Capillary: 197 mg/dL — ABNORMAL HIGH (ref 70–99)

## 2020-08-01 MED ORDER — APIXABAN 5 MG PO TABS
10.0000 mg | ORAL_TABLET | Freq: Two times a day (BID) | ORAL | Status: DC
  Administered 2020-08-01: 10 mg via ORAL
  Filled 2020-08-01: qty 2

## 2020-08-01 MED ORDER — ELIQUIS DVT/PE STARTER PACK 5 MG PO TBPK
ORAL_TABLET | ORAL | 0 refills | Status: DC
Start: 2020-08-01 — End: 2022-09-05

## 2020-08-01 MED ORDER — ACETAMINOPHEN 325 MG PO TABS: 650.0000 mg | ORAL_TABLET | Freq: Four times a day (QID) | ORAL | 0 refills | Status: DC | PRN

## 2020-08-01 MED ORDER — APIXABAN 5 MG PO TABS: 5.0000 mg | ORAL_TABLET | Freq: Two times a day (BID) | ORAL | Status: DC

## 2020-08-01 MED ORDER — APIXABAN 5 MG PO TABS
5.0000 mg | ORAL_TABLET | Freq: Two times a day (BID) | ORAL | 3 refills | Status: DC
Start: 2020-08-31 — End: 2024-07-07

## 2020-08-01 MED ORDER — LANTUS SOLOSTAR 100 UNIT/ML ~~LOC~~ SOPN: 30.0000 [IU] | PEN_INJECTOR | Freq: Every day | SUBCUTANEOUS | 11 refills | Status: DC

## 2020-08-01 MED ORDER — HYDROCORTISONE 10 MG PO TABS: 10.0000 mg | ORAL_TABLET | ORAL | 1 refills | Status: DC

## 2020-08-01 NOTE — Discharge Summary (Signed)
Shawn Fleming, is a 64 y.o. male  DOB Jan 16, 1956  MRN [id number].  Admission date:  07/28/2020  Admitting Physician  Wilder Amodei Denton Brick, MD  Discharge Date:  08/01/2020   Primary MD  [name], MD  Recommendations for primary care physician for things to follow:   1) you are taking Eliquis/apixaban which is a blood thinner so please Avoid ibuprofen/Advil/Aleve/Motrin/Goody Powders/Naproxen/BC powders/Meloxicam/Diclofenac/Indomethacin and other Nonsteroidal anti-inflammatory medications as these will make you more likely to bleed and can cause stomach ulcers, can also cause Kidney problems.   2) please follow-up with endocrinologist Dr. Loni Beckwith, MD, Endocrinology, Diabetes & Metabolism-- [facility], [place], [place] [address], Phone Number- [phone]----for further management of your low cortisol level and poorly controlled diabetes  3) you will need a CBC and BMP blood test within a week  Admission Diagnosis  Dehydration [E86.0] Leg swelling [M79.89] Hyperglycemia [R73.9] AKI (acute kidney injury) ([place]) [N17.9] Hypotension [I95.9]   Discharge Diagnosis  Dehydration [E86.0] Leg swelling [M79.89] Hyperglycemia [R73.9] AKI (acute kidney injury) ([place]) [N17.9] Hypotension [I95.9]    Principal Problem:   Rt DVT (deep venous thrombosis)/VTE Active Problems:   Hypotension   AKI (acute kidney injury) ([place])   Dehydration   Hyperglycemia   Bilateral leg edema   Essential hypertension   Type 2 diabetes mellitus with hyperlipidemia ([facility])   GERD (gastroesophageal reflux disease)   Migraine headache   Abnormal cortisol level      Past Medical History:  Diagnosis Date  . AKI (acute kidney injury) ([place]) 07/29/2020  . Arthritis   . GERD (gastroesophageal reflux disease)   . History of kidney stones   . Hypercholesteremia   . Hypertension   . Migraine   . Sleep apnea      Past Surgical History:  Procedure Laterality Date  . ADENOIDECTOMY    . APPENDECTOMY    . CENTRAL VENOUS CATHETER INSERTION Left 04/20/2013   Procedure: INSERTION CENTRAL LINE LEFT SUBCLAVIAN;  Surgeon: Donato Heinz, MD;  Location: [facility];  Service: General;  Laterality: Left;  . INCISION AND DRAINAGE PERIRECTAL ABSCESS N/A 04/20/2013   Procedure: SHARP SURGICAL DEBRIDEMENT PERINEAL INFECTION;  Surgeon: Donato Heinz, MD;  Location: [facility];  Service: General;  Laterality: N/A;  . KNEE SURGERY Right    arthroscopic  . SHOULDER SURGERY Bilateral    removal of bone.  . TONSILLECTOMY       HPI  from the history and physical done on the day of admission:    Chief Complaint: Low blood pressure and elevated blood glucose level  HPI: AIKEN WITHEM is a 64 y.o. male with medical history significant for hypertension, hyperlipidemia, migraine headache, GERD, sleep apnea (currently not using CPAP due to being unable to tolerate it), back pain and obesity who presents to the emergency department due to lightheadedness, dizziness and weakness at work today.  Patient states that it felt like he was going to pass out.  A colleague activated EMS, on arrival of EMS, patient blood glucose level  was noted to be 440 and blood pressure was systolic of 81, 1 L of IV NS and IV Levophed 5 mcg was given in route to the ED.  Patient denies fever, chills, cough, chest pain.  He endorsed chronic swelling in legs and that right leg is usually more swollen than the left leg.  He was recently diagnosed to have Covid in April of this year.  ED Course: In the emergency department, BP was 81/55, but this improved with IV hydration. Work up in the ED shows normocytic anemia, leukocytosis, hyperglycemia, BUN/Cr 50/2.36 (most recent creatinine for comparison was 3 years ago and it was 1.27).  SARS coronavirus 2 was negative.  1 L of IV LR was given in the ED with improvement in BP.  Hospitalist was asked to admit.  For  further evaluation and management.     Hospital Course:   Brief Summary:- 63 y.o.malewith medical history significant forhypertension, hyperlipidemia, migraine headache, GERD, sleep apnea (currently not using CPAP due to being unable to tolerate it), back pain and obesity who presents to the emergency department due to lightheadedness, dizziness and weakness admitted on 07/29/2019 for persistent hypotension, and found to have right lower extremity DVT  A/p 1)Rt LE DVT--started on IV heparin, suspect patient probably has PE however given creatinine above 2 unable to do CTA --Right lower extremity swelling and tenderness improved significantly dyspnea on exertion persist, denies frank chest pains -Clinically cannot rule out possible PE will treat empirically as such -This appears to be unprovoked DVT/VTE patient will most likely need lifelong anticoagulation - discharge home on Eliquis on 08/01/2020 as his  renal function is improving  2)Recent COVID-19 infection--- diagnosis April 2021, currently asymptomatic -Suspect patient may be hypercoagulable due to recent COVID-19 infection I reduced activity level/reduced mobility post Covid  3)Adrenal insufficiency--- Vs hypothalamic/Pituitary deficiency-- ACTH stimulation test --on 07/30/20 --baseline Cortisol 1.8, cortisol at 30 mins-12.2 and at 60 mins 9.3 -Patient has a subnormal response with a low baseline ACTH  Level--suggesting corticotropin releasing factor and/or ACTH deficiency of hypothalamic and or pituitary origin Also patient had a rise of 10 mg/dL of cortisol from his baseline making significant primary adrenal insufficiency less likely -Patient has been chronically on prednisone for the last 4 weeks prescribed by his PCP Dr. Cindie Laroche for back pain D/c on hydrocortisone as patient presented initially with persistent hypotension -Patient will need further work-up and follow-up with endocrinologist Dr. Loni Beckwith post  discharge  4)Persistent Hypotension--- suspect related to #1 and #3 above --Bps improved -Hydrocortisone as above #3  5)HFpEF--- echo with chronic diastolic dysfunction, EF 60 to  65%, appears compensated  6)AKI----acute kidney injury with hyperkalemia-   - creatinine on admission=  2.36 ,  baseline creatinine = none recently    , creatinine is now= 1.44,  renally adjust medications, avoid nephrotoxic agents / dehydration  / hypotension Received Lokelma -ok to resume lisinopril and and Metformin Stopped Naproxen  7)DM2-uncontrolled DM with hemoglobin A1c of 12.3, --InCrease Lantus insulin to 30 units as we anticipate worsening glycemic control with steroid replacement for cortisol deficiency     Disposition---Discharge home on Eliquis on 08/01/2020 as renal function has improved  Status is: Inpatient  Remains inpatient appropriate because:acute right lower extremity DVT requiring IV heparin, acute kidney injury requiring IV fluids  Disposition: The patient is from: Home  Anticipated d/c is to: Home   Code Status : Full   Family Communication:   (patient is alert, awake and coherent)   Consults  :  na  Discharge Condition: stable  Follow UP   Follow-up Information    Cassandria Anger, MD. Call in 1 week(s).   Specialty: Endocrinology Why: Follow-up for low cortisol level and uncontrolled diabetes Contact information: [place] [address] [phone]                Diet and Activity recommendation:  As advised  Discharge Instructions    Discharge Instructions    Call MD for:  difficulty breathing, headache or visual disturbances   Complete by: As directed    Call MD for:  persistant dizziness or light-headedness   Complete by: As directed    Call MD for:  persistant nausea and vomiting   Complete by: As directed    Call MD for:  temperature >100.4   Complete by: As directed    Diet - low sodium  heart healthy   Complete by: As directed    Diet Carb Modified   Complete by: As directed    Discharge instructions   Complete by: As directed    1) you are taking Eliquis/apixaban which is a blood thinner so please Avoid ibuprofen/Advil/Aleve/Motrin/Goody Powders/Naproxen/BC powders/Meloxicam/Diclofenac/Indomethacin and other Nonsteroidal anti-inflammatory medications as these will make you more likely to bleed and can cause stomach ulcers, can also cause Kidney problems.   2) please follow-up with endocrinologist Dr. Loni Beckwith, MD, Endocrinology, Diabetes & Metabolism-- [facility], [address] [place], [place] [address], Phone Number- [phone]----for further management of your low cortisol level and poorly controlled diabetes  3) you will need a CBC and BMP blood test within a week   Increase activity slowly   Complete by: As directed         Discharge Medications     Allergies as of 08/01/2020   No Known Allergies     Medication List    STOP taking these medications   naproxen 500 MG tablet Commonly known as: NAPROSYN   predniSONE 20 MG tablet Commonly known as: DELTASONE     TAKE these medications   acetaminophen 325 MG tablet Commonly known as: TYLENOL Take 2 tablets (650 mg total) by mouth every 6 (six) hours as needed for mild pain (or Fever >/= 101).   buPROPion 300 MG 24 hr tablet Commonly known as: WELLBUTRIN XL Take 300 mg by mouth daily.   Eliquis DVT/PE Starter Pack Generic drug: Apixaban Starter Pack (10mg  and 5mg ) Take as directed on package: start with two-5mg  tablets twice daily for 7 days. On day 8, switch to one-5mg  tablet twice daily.   apixaban 5 MG Tabs tablet Commonly known as: Eliquis Take 1 tablet (5 mg total) by mouth 2 (two) times daily. Please start around August 31, 2020 after you complete the initial starter pack Start taking on: August 31, 2020   gabapentin 300 MG capsule Commonly known as: NEURONTIN Take 300 mg  by mouth 3 (three) times daily.   hydrocortisone 10 MG tablet Commonly known as: CORTEF Take 1 tablet (10 mg total) by mouth See admin instructions. Please take 10 mg every morning and 5 mg every evening   Lantus SoloStar 100 UNIT/ML Solostar Pen Generic drug: insulin glargine Inject 30 Units into the skin daily. What changed:   how much to take  additional instructions   lisinopril 10 MG tablet Commonly known as: ZESTRIL Take 10 mg by mouth daily.   metFORMIN 500 MG tablet Commonly known as: GLUCOPHAGE Take 500 mg by mouth 2 (two) times daily.  Movantik 25 MG Tabs tablet Generic drug: naloxegol oxalate Take 25 mg by mouth daily.   MULTIVITAMIN PO Take 1 tablet by mouth daily.   Oxycodone HCl 10 MG Tabs Take 10 mg by mouth 4 (four) times daily.   pantoprazole 40 MG tablet Commonly known as: PROTONIX Take 40 mg by mouth daily.   rosuvastatin 10 MG tablet Commonly known as: CRESTOR Take 10 mg by mouth daily.   Vitamin D3 125 MCG (5000 UT) Caps Take 1 capsule by mouth daily.       Major procedures and Radiology Reports - PLEASE review detailed and final reports for all details, in brief -   US Venous Img Lower Bilateral (DVT)  Result Date: 07/29/2020 CLINICAL DATA:  Lower extremity edema EXAM: BILATERAL LOWER EXTREMITY VENOUS DUPLEX ULTRASOUND TECHNIQUE: Gray-scale sonography with graded compression, as well as color Doppler and duplex ultrasound were performed to evaluate the lower extremity deep venous systems from the level of the common femoral vein and including the common femoral, femoral, profunda femoral, popliteal and calf veins including the posterior tibial, peroneal and gastrocnemius veins when visible. The superficial great saphenous vein was also interrogated. Spectral Doppler was utilized to evaluate flow at rest and with distal augmentation maneuvers in the common femoral, femoral and popliteal veins. COMPARISON:  None. FINDINGS: RIGHT LOWER EXTREMITY  Common Femoral Vein: No evidence of thrombus. Normal compressibility, respiratory phasicity and response to augmentation. Saphenofemoral Junction: No evidence of thrombus. Normal compressibility and flow on color Doppler imaging. Profunda Femoral Vein: No evidence of thrombus. Normal compressibility and flow on color Doppler imaging. Femoral Vein: No evidence of thrombus. Normal compressibility, respiratory phasicity and response to augmentation. Popliteal Vein: No evidence of thrombus. Normal compressibility, respiratory phasicity and response to augmentation. Calf Veins: There is acute appearing thrombus in the right posterior tibial vein with absence of flow in this vessel. No compression of this vessel. Peroneal vein on the right appears patent. Superficial Great Saphenous Vein: No evidence of thrombus. Normal compressibility. Venous Reflux:  None. Other Findings:  None. LEFT LOWER EXTREMITY Common Femoral Vein: No evidence of thrombus. Normal compressibility, respiratory phasicity and response to augmentation. Saphenofemoral Junction: No evidence of thrombus. Normal compressibility and flow on color Doppler imaging. Profunda Femoral Vein: No evidence of thrombus. Normal compressibility and flow on color Doppler imaging. Femoral Vein: No evidence of thrombus. Normal compressibility, respiratory phasicity and response to augmentation. Popliteal Vein: No evidence of thrombus. Normal compressibility, respiratory phasicity and response to augmentation. Calf Veins: No evidence of thrombus. Normal compressibility and flow on color Doppler imaging. Superficial Great Saphenous Vein: No evidence of thrombus. Normal compressibility. Venous Reflux:  None. Other Findings:  None. IMPRESSION: 1. Acute appearing deep venous thrombosis in the right posterior tibial vein. No deep venous thrombosis at or above the knee on the right noted. 2. No evident deep venous thrombosis in the left lower extremity venous system.  Electronically Signed   By: Lowella Grip III M.D.   On: 07/29/2020 09:57   ECHOCARDIOGRAM COMPLETE  Result Date: 07/29/2020    ECHOCARDIOGRAM REPORT   Patient Name:   Iven Finn Date of Exam: 07/29/2020 Medical Rec #:  [id number]      Height:       65.0 in Accession #:    [id number]     Weight:       260.0 lb Date of Birth:  09-06-1956     BSA:          2.211 m Patient Age:  63 years       BP:           101/64 mmHg Patient Gender: M              HR:           79 bpm. Exam Location:  [facility] Procedure: 2D Echo, Cardiac Doppler and Color Doppler Indications:    Abnormal ECG 794.31 / R94.31  History:        Patient has no prior history of Echocardiogram examinations.                 Risk Factors:Hypertension, Diabetes and Dyslipidemia. GERD,                 Obesity, Sleep apnea (From Hx).  Sonographer:    Alvino Chapel RCS Referring Phys: [phone] Suzanne Kho IMPRESSIONS  1. Left ventricular ejection fraction, by estimation, is 60 to 65%. The left ventricle has normal function. The left ventricle has no regional wall motion abnormalities. Left ventricular diastolic parameters are consistent with Grade I diastolic dysfunction (impaired relaxation).  2. Right ventricular systolic function is normal. The right ventricular size is normal.  3. The mitral valve is normal in structure. No evidence of mitral valve regurgitation. No evidence of mitral stenosis.  4. The aortic valve has an indeterminant number of cusps. Aortic valve regurgitation is not visualized. No aortic stenosis is present.  5. The inferior vena cava is normal in size with greater than 50% respiratory variability, suggesting right atrial pressure of 3 mmHg. FINDINGS  Left Ventricle: Left ventricular ejection fraction, by estimation, is 60 to 65%. The left ventricle has normal function. The left ventricle has no regional wall motion abnormalities. The left ventricular internal cavity size was normal in size. There is  no left ventricular  hypertrophy. Left ventricular diastolic parameters are consistent with Grade I diastolic dysfunction (impaired relaxation). Normal left ventricular filling pressure. Right Ventricle: The right ventricular size is normal. No increase in right ventricular wall thickness. Right ventricular systolic function is normal. Left Atrium: Left atrial size was normal in size. Right Atrium: Right atrial size was normal in size. Pericardium: There is no evidence of pericardial effusion. Mitral Valve: The mitral valve is normal in structure. No evidence of mitral valve regurgitation. No evidence of mitral valve stenosis. Tricuspid Valve: The tricuspid valve is not well visualized. Tricuspid valve regurgitation is not demonstrated. No evidence of tricuspid stenosis. Aortic Valve: The aortic valve has an indeterminant number of cusps. Aortic valve regurgitation is not visualized. No aortic stenosis is present. Aortic valve mean gradient measures 4.3 mmHg. Aortic valve peak gradient measures 9.1 mmHg. Aortic valve area, by VTI measures 2.98 cm. Pulmonic Valve: The pulmonic valve was not well visualized. Pulmonic valve regurgitation is not visualized. No evidence of pulmonic stenosis. Aorta: The aortic root is normal in size and structure. Pulmonary Artery: Indeterminant PASP, inadequate TR jet. Venous: The inferior vena cava is normal in size with greater than 50% respiratory variability, suggesting right atrial pressure of 3 mmHg. IAS/Shunts: No atrial level shunt detected by color flow Doppler.  LEFT VENTRICLE PLAX 2D LVIDd:         4.01 cm  Diastology LVIDs:         2.58 cm  LV e' lateral:   9.36 cm/s LV PW:         0.90 cm  LV E/e' lateral: 6.6 LV IVS:        0.85 cm  LV e' medial:  6.64 cm/s LVOT diam:     2.20 cm  LV E/e' medial:  9.4 LV SV:         76 LV SV Index:   34 LVOT Area:     3.80 cm  RIGHT VENTRICLE RV S prime:     15.60 cm/s TAPSE (M-mode): 1.9 cm LEFT ATRIUM             Index       RIGHT ATRIUM           Index  LA diam:        3.60 cm 1.63 cm/m  RA Area:     10.70 cm LA Vol (A2C):   40.6 ml 18.36 ml/m RA Volume:   25.40 ml  11.49 ml/m LA Vol (A4C):   47.2 ml 21.34 ml/m LA Biplane Vol: 44.9 ml 20.30 ml/m  AORTIC VALVE AV Area (Vmax):    2.92 cm AV Area (Vmean):   2.70 cm AV Area (VTI):     2.98 cm AV Vmax:           150.83 cm/s AV Vmean:          97.555 cm/s AV VTI:            0.254 m AV Peak Grad:      9.1 mmHg AV Mean Grad:      4.3 mmHg LVOT Vmax:         116.00 cm/s LVOT Vmean:        69.200 cm/s LVOT VTI:          0.199 m LVOT/AV VTI ratio: 0.78  AORTA Ao Root diam: 3.10 cm MITRAL VALVE MV Area (PHT): 3.11 cm    SHUNTS MV Decel Time: 244 msec    Systemic VTI:  0.20 m MV E velocity: 62.20 cm/s  Systemic Diam: 2.20 cm MV A velocity: 83.60 cm/s MV E/A ratio:  0.74 Carlyle Dolly MD Electronically signed by Carlyle Dolly MD Signature Date/Time: 07/29/2020/1:27:00 PM    Final     Micro Results    Recent Results (from the past 240 hour(s))  SARS Coronavirus 2 by RT PCR (hospital order, performed in [facility] hospital lab) Nasopharyngeal Nasopharyngeal Swab     Status: None   Collection Time: 07/28/20  8:41 PM   Specimen: Nasopharyngeal Swab  Result Value Ref Range Status   SARS Coronavirus 2 NEGATIVE NEGATIVE Final    Comment: (NOTE) SARS-CoV-2 target nucleic acids are NOT DETECTED.  The SARS-CoV-2 RNA is generally detectable in upper and lower respiratory specimens during the acute phase of infection. The lowest concentration of SARS-CoV-2 viral copies this assay can detect is 250 copies / mL. A negative result does not preclude SARS-CoV-2 infection and should not be used as the sole basis for treatment or other patient management decisions.  A negative result may occur with improper specimen collection / handling, submission of specimen other than nasopharyngeal swab, presence of viral mutation(s) within the areas targeted by this assay, and inadequate number of viral copies (<250  copies / mL). A negative result must be combined with clinical observations, patient history, and epidemiological information.  Fact Sheet for Patients:   StrictlyIdeas.no  Fact Sheet for Healthcare Providers: BankingDealers.co.za  This test is not yet approved or  cleared by the [place] FDA and has been authorized for detection and/or diagnosis of SARS-CoV-2 by FDA under an Emergency Use Authorization (EUA).  This EUA will remain in effect (meaning this test can be used) for  the duration of the COVID-19 declaration under Section 564(b)(1) of the Act, 21 U.S.C. section 360bbb-3(b)(1), unless the authorization is terminated or revoked sooner.  Performed at [facility], [address], [place], [place] [address]        Today   Subjective    Katai Marsico today has no new complaints  No chest pains, no shortness of breath or dyspnea on exertion -No bleeding concerns          Patient has been seen and examined prior to discharge   Objective   Blood pressure 121/77, pulse 82, temperature 98.3 F (36.8 C), temperature source Oral, resp. rate 17, height 5\' 5"  (1.651 m), weight 114.4 kg, SpO2 99 %.   Intake/Output Summary (Last 24 hours) at 08/01/2020 1625 Last data filed at 08/01/2020 0300 Gross per 24 hour  Intake 1559.5 ml  Output --  Net 1559.5 ml   Exam Gen:- Awake Alert,  In no apparent distress  HEENT:- [place].AT, No sclera icterus Neck-Supple Neck,No JVD,.  Lungs-  CTAB , fair symmetrical air movement CV- S1, S2 normal, regular  Abd-  +ve B.Sounds, Abd Soft, No tenderness, increased truncal adiposity   Extremity/Skin:-Bilateral lower extremity edema/swelling right more than left, pedal pulses present  Psych-affect is appropriate, oriented x3 Neuro-no new focal deficits, no tremors   Data Review   CBC w Diff:  Lab Results  Component Value Date   WBC 10.3 08/01/2020   HGB 10.8 (L) 08/01/2020   HCT 33.7 (L)  08/01/2020   PLT 189 08/01/2020   LYMPHOPCT 17 07/28/2020   MONOPCT 6 07/28/2020   EOSPCT 3 07/28/2020   BASOPCT 0 07/28/2020    CMP:  Lab Results  Component Value Date   NA 137 08/01/2020   K 4.0 08/01/2020   CL 105 08/01/2020   CO2 26 08/01/2020   BUN 21 08/01/2020   CREATININE 1.44 (H) 08/01/2020   PROT 5.6 (L) 07/30/2020   ALBUMIN 3.2 (L) 07/30/2020   BILITOT 0.6 07/30/2020   ALKPHOS 45 07/30/2020   AST 9 (L) 07/30/2020   ALT 15 07/30/2020  .   Total Discharge time is about 33 minutes  Roxan Hockey M.D on 08/01/2020 at 4:25 PM  Go to [email or web address] -  for contact info  [facility] - Office  [phone]

## 2020-08-01 NOTE — Progress Notes (Signed)
[facility] for Heparin-->apixaban Indication: DVT, right leg  No Known Allergies  Patient Measurements: Height: 5\' 5"  (165.1 cm) Weight: 114.4 kg (252 lb 4.8 oz) IBW/kg (Calculated) : 61.5 Heparin Dosing Weight: 92 kg  Vital Signs: Temp: 98.3 F (36.8 C) (08/15 0406) Temp Source: Oral (08/15 0406) BP: 121/77 (08/15 0406) Pulse Rate: 82 (08/15 0406)  Labs: Recent Labs    07/30/20 0515 07/30/20 0515 07/30/20 1348 07/30/20 2236 07/31/20 0555 08/01/20 0758  HGB 10.2*   < >  --   --  10.8* 10.8*  HCT 31.7*  --   --   --  33.8* 33.7*  PLT 179  --   --   --  198 189  HEPARINUNFRC 0.98*   < > 1.32* 0.75* 0.49  --   CREATININE 1.98*  --   --   --   --   --    < > = values in this interval not displayed.    Estimated Creatinine Clearance: 44.1 mL/min (A) (by C-G formula based on SCr of 1.98 mg/dL (H)).   Assessment:  64 yr old male to begin IV heparin for right leg DVT per duplex.  Has been on SQ heparin for VTE prophylaxis, last dose at 2pm today.   AKI, CBC stable.  Not on anticoagulation PTA.   Goal of Therapy:  Heparin level 0.3-0.7 units/ml Monitor platelets by anticoagulation protocol: Yes      Indication for apixaban:  acute DVT/PE  Criteria for dosing reviewed: 1)  Age:64 years 2)  Weight 114 kg 3)  Serum Creatinine: 1.98 Drug/Drug Interaction reviewed: n/a     Plan:  Discontinue heparin infusion now  - Start apixaban 10 mg twice daily x7 days, then apixaban  5mg  bid  - Monitor for s/s of bleeding    - Provide education prior to discharge for this new medication.    Despina Pole, Pharm. D. Clinical Pharmacist 08/01/2020 8:47 AM

## 2020-08-01 NOTE — Progress Notes (Signed)
IVs removed, 2x2 gauze and paper tape applied to sites, patient tolerated well. Reviewed AVS with patient who verbalized understanding.  Patient transported to front lobby via wheelchair and transported home by a friend.

## 2020-08-01 NOTE — Discharge Instructions (Signed)
1) you are taking Eliquis/apixaban which is a blood thinner so please Avoid ibuprofen/Advil/Aleve/Motrin/Goody Powders/Naproxen/BC powders/Meloxicam/Diclofenac/Indomethacin and other Nonsteroidal anti-inflammatory medications as these will make you more likely to bleed and can cause stomach ulcers, can also cause Kidney problems.   2) please follow-up with endocrinologist Dr. Loni Beckwith, MD, Endocrinology, Diabetes & Metabolism-- [facility], [address] [place], [place] [address], Phone Number- [phone]----for further management of your low cortisol level and poorly controlled diabetes  3) you will need a CBC and BMP blood test within a week

## 2020-09-03 ENCOUNTER — Other Ambulatory Visit: Payer: Self-pay | Admitting: Family Medicine

## 2020-09-03 DIAGNOSIS — M545 Low back pain, unspecified: Secondary | ICD-10-CM

## 2020-09-09 ENCOUNTER — Ambulatory Visit (INDEPENDENT_AMBULATORY_CARE_PROVIDER_SITE_OTHER): Admitting: "Endocrinology

## 2020-09-09 ENCOUNTER — Other Ambulatory Visit: Payer: Self-pay

## 2020-09-09 ENCOUNTER — Encounter: Payer: Self-pay | Admitting: "Endocrinology

## 2020-09-09 VITALS — BP 98/54 | HR 102 | Ht 65.0 in | Wt 255.0 lb

## 2020-09-09 DIAGNOSIS — N182 Chronic kidney disease, stage 2 (mild): Secondary | ICD-10-CM | POA: Diagnosis not present

## 2020-09-09 DIAGNOSIS — E782 Mixed hyperlipidemia: Secondary | ICD-10-CM | POA: Insufficient documentation

## 2020-09-09 DIAGNOSIS — E1122 Type 2 diabetes mellitus with diabetic chronic kidney disease: Secondary | ICD-10-CM

## 2020-09-09 DIAGNOSIS — IMO0002 Reserved for concepts with insufficient information to code with codable children: Secondary | ICD-10-CM

## 2020-09-09 DIAGNOSIS — E2740 Unspecified adrenocortical insufficiency: Secondary | ICD-10-CM

## 2020-09-09 DIAGNOSIS — E1165 Type 2 diabetes mellitus with hyperglycemia: Secondary | ICD-10-CM

## 2020-09-09 MED ORDER — HYDROCORTISONE 10 MG PO TABS: 10.0000 mg | ORAL_TABLET | Freq: Two times a day (BID) | ORAL | 0 refills | Status: DC

## 2020-09-09 NOTE — Progress Notes (Signed)
Endocrinology Consult Note       09/09/2020, 2:31 PM   Subjective:    Patient ID: Shawn Fleming, male    DOB: [birth year].  Shawn Fleming is being seen in consultation for management of currently uncontrolled symptomatic diabetes requested by  [name], MD.   Past Medical History:  Diagnosis Date  . AKI (acute kidney injury) ([place]) 07/29/2020  . Arthritis   . GERD (gastroesophageal reflux disease)   . History of kidney stones   . Hypercholesteremia   . Hypertension   . Migraine   . Sleep apnea     Past Surgical History:  Procedure Laterality Date  . ADENOIDECTOMY    . APPENDECTOMY    . CENTRAL VENOUS CATHETER INSERTION Left 04/20/2013   Procedure: INSERTION CENTRAL LINE LEFT SUBCLAVIAN;  Surgeon: Donato Heinz, MD;  Location: [facility];  Service: General;  Laterality: Left;  . INCISION AND DRAINAGE PERIRECTAL ABSCESS N/A 04/20/2013   Procedure: SHARP SURGICAL DEBRIDEMENT PERINEAL INFECTION;  Surgeon: Donato Heinz, MD;  Location: [facility];  Service: General;  Laterality: N/A;  . KNEE SURGERY Right    arthroscopic  . SHOULDER SURGERY Bilateral    removal of bone.  . TONSILLECTOMY      Social History   Socioeconomic History  . Marital status: Married    Spouse name: Not on file  . Number of children: Not on file  . Years of education: Not on file  . Highest education level: Not on file  Occupational History  . Not on file  Tobacco Use  . Smoking status: Former Smoker    Packs/day: 0.50    Years: 23.00    Pack years: 11.50    Types: Cigarettes  . Smokeless tobacco: Never Used  Vaping Use  . Vaping Use: Former  Substance and Sexual Activity  . Alcohol use: Yes    Comment: rare  . Drug use: No  . Sexual activity: Not Currently    Birth control/protection: None  Other Topics Concern  . Not on file  Social History Narrative  . Not on file   Social Determinants of Health    Financial Resource Strain:   . Difficulty of Paying Living Expenses: Not on file  Food Insecurity:   . Worried About Charity fundraiser in the Last Year: Not on file  . Ran Out of Food in the Last Year: Not on file  Transportation Needs:   . Lack of Transportation (Medical): Not on file  . Lack of Transportation (Non-Medical): Not on file  Physical Activity:   . Days of Exercise per Week: Not on file  . Minutes of Exercise per Session: Not on file  Stress:   . Feeling of Stress : Not on file  Social Connections:   . Frequency of Communication with Friends and Family: Not on file  . Frequency of Social Gatherings with Friends and Family: Not on file  . Attends Religious Services: Not on file  . Active Member of Clubs or Organizations: Not on file  . Attends Archivist Meetings: Not on file  . Marital Status: Not on file  Family History  Problem Relation Age of Onset  . Cancer Mother     Outpatient Encounter Medications as of 09/09/2020  Medication Sig  . alfuzosin (UROXATRAL) 10 MG 24 hr tablet Take 10 mg by mouth daily.  Marland Kitchen apixaban (ELIQUIS) 5 MG TABS tablet Take 1 tablet (5 mg total) by mouth 2 (two) times daily. Please start around August 31, 2020 after you complete the initial starter pack  . Apixaban Starter Pack, 10mg  and 5mg , (ELIQUIS DVT/PE STARTER PACK) Take as directed on package: start with two-5mg  tablets twice daily for 7 days. On day 8, switch to one-5mg  tablet twice daily.  Marland Kitchen buPROPion (WELLBUTRIN XL) 300 MG 24 hr tablet Take 300 mg by mouth daily.  . Cholecalciferol (VITAMIN D3) 5000 units CAPS Take 2 capsules by mouth daily.   Marland Kitchen gabapentin (NEURONTIN) 300 MG capsule Take 300 mg by mouth 3 (three) times daily.  . hydrocortisone (CORTEF) 10 MG tablet Take 1 tablet (10 mg total) by mouth 2 (two) times daily. 1 pill every morning at 8AM and 1 pill every day at 1PM  . LANTUS SOLOSTAR 100 UNIT/ML Solostar Pen Inject 30 Units into the skin daily.  .  metFORMIN (GLUCOPHAGE) 500 MG tablet Take 500 mg by mouth 2 (two) times daily.  . Multiple Vitamins-Minerals (MULTIVITAMIN PO) Take 1 tablet by mouth daily.  . naloxegol oxalate (MOVANTIK) 25 MG TABS tablet Take 25 mg by mouth daily.  . Oxycodone HCl 10 MG TABS Take 10 mg by mouth 4 (four) times daily.  . pantoprazole (PROTONIX) 40 MG tablet Take 40 mg by mouth daily.  . rosuvastatin (CRESTOR) 10 MG tablet Take 10 mg by mouth daily.  . [DISCONTINUED] acetaminophen (TYLENOL) 325 MG tablet Take 2 tablets (650 mg total) by mouth every 6 (six) hours as needed for mild pain (or Fever >/= 101).  . [DISCONTINUED] hydrocortisone (CORTEF) 10 MG tablet Take 1 tablet (10 mg total) by mouth See admin instructions. Please take 10 mg every morning and 5 mg every evening  . [DISCONTINUED] lisinopril (ZESTRIL) 10 MG tablet Take 10 mg by mouth daily.   No facility-administered encounter medications on file as of 09/09/2020.    ALLERGIES: Allergies  Allergen Reactions  . Prednisone     VACCINATION STATUS: There is no immunization history for the selected administration types on file for this patient.  Diabetes He presents for his initial diabetic visit. He has type 2 diabetes mellitus. Onset time: He was diagnosed at approximate age of 4 years. His disease course has been worsening. Hypoglycemia symptoms include dizziness. Pertinent negatives for hypoglycemia include no confusion, headaches, pallor or seizures. Associated symptoms include polydipsia and polyuria. Pertinent negatives for diabetes include no chest pain, no fatigue, no polyphagia and no weakness. There are no hypoglycemic complications. Symptoms are worsening. Diabetic complications include nephropathy and peripheral neuropathy. Risk factors for coronary artery disease include dyslipidemia, diabetes mellitus, family history, obesity, male sex, hypertension, sedentary lifestyle and tobacco exposure. Current diabetic treatments: Is currently on  Lantus 30 units nightly, Metformin 500 mg p.o. twice daily. His weight is increasing steadily. He is following a generally unhealthy diet. When asked about meal planning, he reported none. He has not had a previous visit with a dietitian. His home blood glucose trend is decreasing steadily. (He did not bring any logs nor meter with him.  His recent A1c was 12.3%.  He denies hypoglycemia.) An ACE inhibitor/angiotensin II receptor blocker is being taken. Eye exam is current.  Hyperlipidemia This is  a chronic problem. The current episode started more than 1 year ago. Exacerbating diseases include chronic renal disease, diabetes and obesity. Pertinent negatives include no chest pain, myalgias or shortness of breath. Current antihyperlipidemic treatment includes statins. Risk factors for coronary artery disease include diabetes mellitus, dyslipidemia, family history, hypertension, male sex, obesity and a sedentary lifestyle.  Hypertension This is a chronic problem. The current episode started more than 1 year ago. Pertinent negatives include no chest pain, headaches, neck pain, palpitations or shortness of breath. Risk factors for coronary artery disease include diabetes mellitus, dyslipidemia, obesity, male gender, sedentary lifestyle, smoking/tobacco exposure and family history. Past treatments include ACE inhibitors. Hypertensive end-organ damage includes kidney disease. Identifiable causes of hypertension include chronic renal disease.     Review of Systems  Constitutional: Negative for chills, fatigue, fever and unexpected weight change.  HENT: Negative for dental problem, mouth sores and trouble swallowing.   Eyes: Negative for visual disturbance.  Respiratory: Negative for cough, choking, chest tightness, shortness of breath and wheezing.   Cardiovascular: Negative for chest pain, palpitations and leg swelling.  Gastrointestinal: Negative for abdominal distention, abdominal pain, constipation,  diarrhea, nausea and vomiting.  Endocrine: Positive for polydipsia and polyuria. Negative for polyphagia.  Genitourinary: Negative for dysuria, flank pain, hematuria and urgency.  Musculoskeletal: Negative for back pain, gait problem, myalgias and neck pain.  Skin: Negative for pallor, rash and wound.  Neurological: Positive for dizziness. Negative for seizures, syncope, weakness, numbness and headaches.  Psychiatric/Behavioral: Negative for confusion and dysphoric mood.    Objective:    Vitals with BMI 09/09/2020 09/09/2020 08/01/2020  Height - 5\' 5"  -  Weight - 255 lbs -  BMI - 31.51 -  Systolic 98 67 761  Diastolic 54 51 77  Pulse 607 116 82    BP (!) 98/54   Pulse (!) 102   Ht 5\' 5"  (1.651 m)   Wt 255 lb (115.7 kg)   BMI 42.43 kg/m   Wt Readings from Last 3 Encounters:  09/09/20 255 lb (115.7 kg)  07/30/20 252 lb 4.8 oz (114.4 kg)  02/13/19 237 lb (107.5 kg)     Physical Exam Constitutional:      General: He is not in acute distress.    Appearance: He is well-developed. He is obese.  HENT:     Head: Normocephalic and atraumatic.  Neck:     Thyroid: No thyromegaly.     Trachea: No tracheal deviation.  Cardiovascular:     Rate and Rhythm: Normal rate.     Pulses:          Dorsalis pedis pulses are 1+ on the right side and 1+ on the left side.       Posterior tibial pulses are 1+ on the right side and 1+ on the left side.     Heart sounds: S1 normal and S2 normal. No murmur heard.  No gallop.   Pulmonary:     Effort: Pulmonary effort is normal. No respiratory distress.     Breath sounds: No wheezing.  Abdominal:     General: There is no distension.     Tenderness: There is no abdominal tenderness. There is no guarding.  Musculoskeletal:     Right shoulder: No swelling or deformity.     Cervical back: Normal range of motion and neck supple.  Skin:    General: Skin is warm and dry.     Findings: No rash.     Nails: There is no clubbing.  Neurological:  Mental Status: He is alert and oriented to person, place, and time.     Cranial Nerves: No cranial nerve deficit.     Sensory: No sensory deficit.     Gait: Gait normal.     Deep Tendon Reflexes: Reflexes are normal and symmetric.  Psychiatric:        Speech: Speech normal.        Behavior: Behavior normal. Behavior is cooperative.        Thought Content: Thought content normal.        Judgment: Judgment normal.       CMP ( most recent) CMP     Component Value Date/Time   NA 137 08/01/2020 0758   K 4.0 08/01/2020 0758   CL 105 08/01/2020 0758   CO2 26 08/01/2020 0758   GLUCOSE 235 (H) 08/01/2020 0758   BUN 21 08/01/2020 0758   CREATININE 1.44 (H) 08/01/2020 0758   CALCIUM 8.0 (L) 08/01/2020 0758   PROT 5.6 (L) 07/30/2020 0515   ALBUMIN 3.2 (L) 07/30/2020 0515   AST 9 (L) 07/30/2020 0515   ALT 15 07/30/2020 0515   ALKPHOS 45 07/30/2020 0515   BILITOT 0.6 07/30/2020 0515   GFRNONAA 51 (L) 08/01/2020 0758   GFRAA 59 (L) 08/01/2020 0758     Diabetic Labs (most recent): Lab Results  Component Value Date   HGBA1C 12.3 (H) 07/28/2020     Lab Results  Component Value Date   TSH 0.622 07/29/2020   TSH 2.456 04/30/2013       Assessment & Plan:   1. 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 Recent labs reviewed. - I had a long discussion with him about the progressive nature of diabetes and the pathology behind its complications. -his diabetes is complicated by CKD, obesity/sedentary  and he remains at a high risk for more acute and chronic complications which include CAD, CVA, CKD, retinopathy, and neuropathy. These are all discussed in detail with him.  - I have counseled him on diet  and weight management  by adopting a carbohydrate restricted/protein rich diet. Patient is encouraged to switch to  unprocessed  or minimally processed     complex starch and increased protein intake (animal or plant source), fruits, and vegetables. -  he is advised to stick to a routine mealtimes to eat 3 meals  a day and avoid unnecessary snacks ( to snack only to correct hypoglycemia).   - he admits that there is a room for improvement in his food and drink choices. - Suggestion is made for him to avoid simple carbohydrates  from his diet including Cakes, Sweet Desserts, Ice Cream, Soda (diet and regular), Sweet Tea, Candies, Chips, Cookies, Store Bought Juices, Alcohol in Excess of  1-2 drinks a day, Artificial Sweeteners,  Coffee Creamer, and "Sugar-free" Products. This will help patient to have more stable blood glucose profile and potentially avoid unintended weight gain.  - he will be scheduled with Jearld Fenton, RDN, CDE for diabetes education.  - I have approached him with the following individualized plan to manage  his diabetes and patient agrees:   - he reports improvement in his glycemic profile since his Lantus was adjusted at 30 units nightly.   -He may need adjustment in his treatment, however did not bring any logs nor meter.   -  His approach to monitor blood glucose strictly 4 times a day-before meals and at bedtime and return in 7-10 days with his meter and logs for evaluation.   -In the meantime, he is advised to continue Lantus 30 units nightly, Metformin 500 mg p.o. twice daily-after breakfast and after supper.   - he is encouraged to call clinic for blood glucose levels less than 70 or above 300 mg /dl.  - Specific targets for  A1c;  LDL, HDL,  and Triglycerides were discussed with the patient.  2) Blood Pressure /Hypertension: He was found to have low blood pressure at clinic today , likely related to his recent diagnosis of adrenal insufficiency.  He is advised to discontinue lisinopril for now.  See below.    3) Lipids/Hyperlipidemia: He does not have recent lipid panel to review.  He is  currently on Crestor 10 mg p.o. nightly.  He is advised to continue the same.     4)  Weight/Diet:  Body mass index is 42.43 kg/m.  -   clearly complicating his diabetes care.   he is  a candidate for weight loss. I discussed with him the fact that loss of 5 - 10% of his  current body weight will have the most impact on his diabetes management.  Exercise, and detailed carbohydrates information provided  -  detailed on discharge instructions.   5) adrenal insufficiency During his recent hospitalization, he was found to have profoundly low basal cortisol at 1.8.  ACTH stimulation test showed inadequate adrenal response with 12.2 at 30 minutes, 9.3 at 60 minutes.  Etiology of her insufficiency is not settled yet.  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    In light of his presentation with hypotension, he will be continued on hydrocortisone support 10 mg p.o. twice daily, and plan to reassess adrenal sufficiency in 39-month.  He will be switched to dexamethasone while undergoing repeat ACTH stimulation test.  6) Chronic Care/Health Maintenance:  -he  is on and Statin medications and  is encouraged to initiate and continue to follow up with Ophthalmology, Dentist,  Podiatrist at least yearly or according to recommendations, and advised to   stay away from smoking. I have recommended yearly flu vaccine and pneumonia vaccine at least every 5 years; moderate intensity exercise for up to 150 minutes weekly; and  sleep for at least 7 hours a day.  - he is  advised to maintain close follow up with [name], MD for primary care needs, as well as his other providers for optimal and coordinated care.   - Time spent in this patient care: 60 min, of which > 50% was spent in  counseling  him about his currently uncontrolled, symptomatic type 2 diabetes; adrenal insufficiency; hyperlipidemia; hypertension and the rest reviewing his blood glucose logs , discussing his  hypoglycemia and hyperglycemia episodes, reviewing his current and  previous labs / studies  ( including abstraction from other facilities) and medications  doses and developing a  long term treatment plan based on the latest standards of care/ guidelines; and documenting his care.    Please refer to Patient Instructions for Blood Glucose Monitoring and Insulin/Medications Dosing Guide"  in media tab for additional information. Please  also refer to " Patient Self Inventory" in the Media  tab for reviewed elements of pertinent patient history.  Shawn Fleming participated in the discussions, expressed understanding, and voiced agreement with the above plans.  All questions were  answered to his satisfaction. he is encouraged to contact clinic should he have any questions or concerns prior to his return visit.   Follow up plan: - Return in about 11 days (around 09/20/2020) for F/U with Meter and Logs Only - no Labs.  Shawn Lloyd, MD [facility] [address] [place], [place] [address] Phone: [phone]  Fax: [phone]    09/09/2020, 2:31 PM  This note was partially dictated with voice recognition software. Similar sounding words can be transcribed inadequately or may not  be corrected upon review.

## 2020-09-09 NOTE — Patient Instructions (Signed)

## 2020-09-10 ENCOUNTER — Ambulatory Visit (HOSPITAL_COMMUNITY)
Admission: RE | Admit: 2020-09-10 | Discharge: 2020-09-10 | Disposition: A | Discharge status: Home / Self Care | Source: Ambulatory Visit | Attending: Family Medicine | Admitting: Family Medicine

## 2020-09-10 DIAGNOSIS — M545 Low back pain, unspecified: Secondary | ICD-10-CM

## 2020-09-10 DIAGNOSIS — G8929 Other chronic pain: Secondary | ICD-10-CM | POA: Diagnosis present

## 2020-09-20 ENCOUNTER — Encounter: Payer: Self-pay | Admitting: "Endocrinology

## 2020-09-20 ENCOUNTER — Other Ambulatory Visit: Payer: Self-pay

## 2020-09-20 ENCOUNTER — Ambulatory Visit (INDEPENDENT_AMBULATORY_CARE_PROVIDER_SITE_OTHER): Admitting: "Endocrinology

## 2020-09-20 VITALS — BP 138/81 | HR 97 | Ht 65.0 in | Wt 252.0 lb

## 2020-09-20 DIAGNOSIS — E1165 Type 2 diabetes mellitus with hyperglycemia: Secondary | ICD-10-CM

## 2020-09-20 DIAGNOSIS — E2740 Unspecified adrenocortical insufficiency: Secondary | ICD-10-CM

## 2020-09-20 DIAGNOSIS — E782 Mixed hyperlipidemia: Secondary | ICD-10-CM | POA: Diagnosis not present

## 2020-09-20 DIAGNOSIS — E1122 Type 2 diabetes mellitus with diabetic chronic kidney disease: Secondary | ICD-10-CM | POA: Diagnosis not present

## 2020-09-20 DIAGNOSIS — N182 Chronic kidney disease, stage 2 (mild): Secondary | ICD-10-CM

## 2020-09-20 DIAGNOSIS — IMO0002 Reserved for concepts with insufficient information to code with codable children: Secondary | ICD-10-CM

## 2020-09-20 MED ORDER — LANTUS SOLOSTAR 100 UNIT/ML ~~LOC~~ SOPN: 40.0000 [IU] | PEN_INJECTOR | Freq: Every day | SUBCUTANEOUS | 2 refills | Status: DC

## 2020-09-20 NOTE — Patient Instructions (Signed)

## 2020-09-20 NOTE — Progress Notes (Signed)
09/20/2020, 11:11 AM  Endocrinology follow-up note   Subjective:    Patient ID: Shawn Fleming, male    DOB: 1956/04/21.  Shawn Fleming is being seen in follow-up after he was seen in consultation for management of currently uncontrolled symptomatic diabetes requested by  [name], MD.   Past Medical History:  Diagnosis Date  . AKI (acute kidney injury) ([place]) 07/29/2020  . Arthritis   . GERD (gastroesophageal reflux disease)   . History of kidney stones   . Hypercholesteremia   . Hypertension   . Migraine   . Sleep apnea     Past Surgical History:  Procedure Laterality Date  . ADENOIDECTOMY    . APPENDECTOMY    . CENTRAL VENOUS CATHETER INSERTION Left 04/20/2013   Procedure: INSERTION CENTRAL LINE LEFT SUBCLAVIAN;  Surgeon: Donato Heinz, MD;  Location: [facility];  Service: General;  Laterality: Left;  . INCISION AND DRAINAGE PERIRECTAL ABSCESS N/A 04/20/2013   Procedure: SHARP SURGICAL DEBRIDEMENT PERINEAL INFECTION;  Surgeon: Donato Heinz, MD;  Location: [facility];  Service: General;  Laterality: N/A;  . KNEE SURGERY Right    arthroscopic  . SHOULDER SURGERY Bilateral    removal of bone.  . TONSILLECTOMY      Social History   Socioeconomic History  . Marital status: Married    Spouse name: Not on file  . Number of children: Not on file  . Years of education: Not on file  . Highest education level: Not on file  Occupational History  . Not on file  Tobacco Use  . Smoking status: Former Smoker    Packs/day: 0.50    Years: 23.00    Pack years: 11.50    Types: Cigarettes  . Smokeless tobacco: Never Used  Vaping Use  . Vaping Use: Former  Substance and Sexual Activity  . Alcohol use: Yes    Comment: rare  . Drug use: No  . Sexual activity: Not Currently    Birth control/protection: None  Other Topics Concern  . Not on file  Social History Narrative  . Not on file   Social  Determinants of Health   Financial Resource Strain:   . Difficulty of Paying Living Expenses: Not on file  Food Insecurity:   . Worried About Charity fundraiser in the Last Year: Not on file  . Ran Out of Food in the Last Year: Not on file  Transportation Needs:   . Lack of Transportation (Medical): Not on file  . Lack of Transportation (Non-Medical): Not on file  Physical Activity:   . Days of Exercise per Week: Not on file  . Minutes of Exercise per Session: Not on file  Stress:   . Feeling of Stress : Not on file  Social Connections:   . Frequency of Communication with Friends and Family: Not on file  . Frequency of Social Gatherings with Friends and Family: Not on file  . Attends Religious Services: Not on file  . Active Member of Clubs or Organizations: Not on file  . Attends Archivist Meetings: Not on file  . Marital Status: Not on file    Family History  Problem  Relation Age of Onset  . Cancer Mother     Outpatient Encounter Medications as of 09/20/2020  Medication Sig  . alfuzosin (UROXATRAL) 10 MG 24 hr tablet Take 10 mg by mouth daily.  Marland Kitchen apixaban (ELIQUIS) 5 MG TABS tablet Take 1 tablet (5 mg total) by mouth 2 (two) times daily. Please start around August 31, 2020 after you complete the initial starter pack  . Apixaban Starter Pack, 10mg  and 5mg , (ELIQUIS DVT/PE STARTER PACK) Take as directed on package: start with two-5mg  tablets twice daily for 7 days. On day 8, switch to one-5mg  tablet twice daily.  Marland Kitchen buPROPion (WELLBUTRIN XL) 300 MG 24 hr tablet Take 300 mg by mouth daily.  . Cholecalciferol (VITAMIN D3) 5000 units CAPS Take 2 capsules by mouth daily.   Marland Kitchen gabapentin (NEURONTIN) 300 MG capsule Take 300 mg by mouth 3 (three) times daily.  . hydrocortisone (CORTEF) 10 MG tablet Take 1 tablet (10 mg total) by mouth 2 (two) times daily. 1 pill every morning at 8AM and 1 pill every day at 1PM  . LANTUS SOLOSTAR 100 UNIT/ML Solostar Pen Inject 40 Units into  the skin at bedtime.  . metFORMIN (GLUCOPHAGE) 500 MG tablet Take 500 mg by mouth 2 (two) times daily.  . Multiple Vitamins-Minerals (MULTIVITAMIN PO) Take 1 tablet by mouth daily.  . naloxegol oxalate (MOVANTIK) 25 MG TABS tablet Take 25 mg by mouth daily.  . Oxycodone HCl 10 MG TABS Take 10 mg by mouth 4 (four) times daily.  . pantoprazole (PROTONIX) 40 MG tablet Take 40 mg by mouth daily.  . rosuvastatin (CRESTOR) 10 MG tablet Take 10 mg by mouth daily.  . [DISCONTINUED] LANTUS SOLOSTAR 100 UNIT/ML Solostar Pen Inject 30 Units into the skin daily.   No facility-administered encounter medications on file as of 09/20/2020.    ALLERGIES: Allergies  Allergen Reactions  . Prednisone     VACCINATION STATUS: There is no immunization history for the selected administration types on file for this patient.  Diabetes He presents for his follow-up diabetic visit. He has type 2 diabetes mellitus. Onset time: He was diagnosed at approximate age of 64 years. His disease course has been improving. Hypoglycemia symptoms include dizziness. Pertinent negatives for hypoglycemia include no confusion, headaches, pallor or seizures. Pertinent negatives for diabetes include no chest pain, no fatigue, no polydipsia, no polyphagia, no polyuria and no weakness. There are no hypoglycemic complications. Symptoms are improving. Diabetic complications include nephropathy and peripheral neuropathy. Risk factors for coronary artery disease include dyslipidemia, diabetes mellitus, family history, obesity, male sex, hypertension, sedentary lifestyle and tobacco exposure. Current diabetic treatments: Is currently on Lantus 30 units nightly, Metformin 500 mg p.o. twice daily. His weight is increasing steadily. He is following a generally unhealthy diet. When asked about meal planning, he reported none. He has not had a previous visit with a dietitian. His home blood glucose trend is decreasing steadily. His breakfast blood  glucose range is generally 140-180 mg/dl. His lunch blood glucose range is generally 180-200 mg/dl. His dinner blood glucose range is generally 180-200 mg/dl. His bedtime blood glucose range is generally 140-180 mg/dl. His overall blood glucose range is 140-180 mg/dl. (He presents with improving glycemic profile, despite his recent A1c of 12.3%.   He denies hypoglycemia.) An ACE inhibitor/angiotensin II receptor blocker is being taken. Eye exam is current.  Hyperlipidemia This is a chronic problem. The current episode started more than 1 year ago. Exacerbating diseases include chronic renal disease, diabetes and obesity.  Pertinent negatives include no chest pain, myalgias or shortness of breath. Current antihyperlipidemic treatment includes statins. Risk factors for coronary artery disease include diabetes mellitus, dyslipidemia, family history, hypertension, male sex, obesity and a sedentary lifestyle.  Hypertension This is a chronic problem. The current episode started more than 1 year ago. Pertinent negatives include no chest pain, headaches, neck pain, palpitations or shortness of breath. Risk factors for coronary artery disease include diabetes mellitus, dyslipidemia, obesity, male gender, sedentary lifestyle, smoking/tobacco exposure and family history. Past treatments include ACE inhibitors. Hypertensive end-organ damage includes kidney disease. Identifiable causes of hypertension include chronic renal disease.     Review of Systems  Constitutional: Negative for chills, fatigue, fever and unexpected weight change.  HENT: Negative for dental problem, mouth sores and trouble swallowing.   Eyes: Negative for visual disturbance.  Respiratory: Negative for cough, choking, chest tightness, shortness of breath and wheezing.   Cardiovascular: Negative for chest pain, palpitations and leg swelling.  Gastrointestinal: Negative for abdominal distention, abdominal pain, constipation, diarrhea, nausea and  vomiting.  Endocrine: Negative for polydipsia, polyphagia and polyuria.  Genitourinary: Negative for dysuria, flank pain, hematuria and urgency.  Musculoskeletal: Negative for back pain, gait problem, myalgias and neck pain.  Skin: Negative for pallor, rash and wound.  Neurological: Positive for dizziness. Negative for seizures, syncope, weakness, numbness and headaches.  Psychiatric/Behavioral: Negative for confusion and dysphoric mood.    Objective:    Vitals with BMI 09/20/2020 09/09/2020 09/09/2020  Height 5\' 5"  - 5\' 5"   Weight 252 lbs - 255 lbs  BMI 42.35 - 36.14  Systolic 431 98 67  Diastolic 81 54 51  Pulse 97 102 116    BP 138/81   Pulse 97   Ht 5\' 5"  (1.651 m)   Wt 252 lb (114.3 kg)   BMI 41.93 kg/m   Wt Readings from Last 3 Encounters:  09/20/20 252 lb (114.3 kg)  09/09/20 255 lb (115.7 kg)  07/30/20 252 lb 4.8 oz (114.4 kg)     Physical Exam Constitutional:      General: He is not in acute distress.    Appearance: He is well-developed. He is obese.  HENT:     Head: Normocephalic and atraumatic.  Neck:     Thyroid: No thyromegaly.     Trachea: No tracheal deviation.  Cardiovascular:     Rate and Rhythm: Normal rate.     Pulses:          Dorsalis pedis pulses are 1+ on the right side and 1+ on the left side.       Posterior tibial pulses are 1+ on the right side and 1+ on the left side.     Heart sounds: S1 normal and S2 normal. No murmur heard.  No gallop.   Pulmonary:     Effort: Pulmonary effort is normal. No respiratory distress.     Breath sounds: No wheezing.  Abdominal:     General: There is no distension.     Tenderness: There is no abdominal tenderness. There is no guarding.  Musculoskeletal:     Right shoulder: No swelling or deformity.     Cervical back: Normal range of motion and neck supple.  Skin:    General: Skin is warm and dry.     Findings: No rash.     Nails: There is no clubbing.  Neurological:     Mental Status: He is alert and  oriented to person, place, and time.     Cranial Nerves:  No cranial nerve deficit.     Sensory: No sensory deficit.     Gait: Gait normal.     Deep Tendon Reflexes: Reflexes are normal and symmetric.  Psychiatric:        Speech: Speech normal.        Behavior: Behavior normal. Behavior is cooperative.        Thought Content: Thought content normal.        Judgment: Judgment normal.       CMP ( most recent) CMP     Component Value Date/Time   NA 137 08/01/2020 0758   K 4.0 08/01/2020 0758   CL 105 08/01/2020 0758   CO2 26 08/01/2020 0758   GLUCOSE 235 (H) 08/01/2020 0758   BUN 21 08/01/2020 0758   CREATININE 1.44 (H) 08/01/2020 0758   CALCIUM 8.0 (L) 08/01/2020 0758   PROT 5.6 (L) 07/30/2020 0515   ALBUMIN 3.2 (L) 07/30/2020 0515   AST 9 (L) 07/30/2020 0515   ALT 15 07/30/2020 0515   ALKPHOS 45 07/30/2020 0515   BILITOT 0.6 07/30/2020 0515   GFRNONAA 51 (L) 08/01/2020 0758   GFRAA 59 (L) 08/01/2020 0758     Diabetic Labs (most recent): Lab Results  Component Value Date   HGBA1C 12.3 (H) 07/28/2020     Lab Results  Component Value Date   TSH 0.622 07/29/2020   TSH 2.456 04/30/2013       Assessment & Plan:   1. 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  He presents with improving glycemic profile, despite his recent A1c of 12.3%.   He denies hypoglycemia. - I had a long discussion with him about the progressive nature of diabetes and the pathology behind its complications. -his diabetes is complicated by CKD, obesity/sedentary  and he remains at a high risk for more acute and chronic complications which include CAD, CVA, CKD, retinopathy, and neuropathy. These are all discussed in detail with him.  - I have counseled him on diet  and weight management  by adopting a carbohydrate restricted/protein rich diet. Patient is encouraged to  switch to  unprocessed or minimally processed     complex starch and increased protein intake (animal or plant source), fruits, and vegetables. -  he is advised to stick to a routine mealtimes to eat 3 meals  a day and avoid unnecessary snacks ( to snack only to correct hypoglycemia).   - he  admits there is a room for improvement in his diet and drink choices. -  Suggestion is made for him to avoid simple carbohydrates  from his diet including Cakes, Sweet Desserts / Pastries, Ice Cream, Soda (diet and regular), Sweet Tea, Candies, Chips, Cookies, Sweet Pastries,  Store Bought Juices, Alcohol in Excess of  1-2 drinks a day, Artificial Sweeteners, Coffee Creamer, and "Sugar-free" Products. This will help patient to have stable blood glucose profile and potentially avoid unintended weight gain.   - he will be scheduled with Shawn Fleming, RDN, CDE for diabetes education.  - I have approached him with the following individualized plan to manage  his diabetes and patient agrees:   -In light of his presentation with near target glycemic profile both fasting and postprandial, he will not need prandial insulin for now. -He has room on his basal insulin, advised to increase Lantus to 40 units nightly, continue Metformin 500 mg p.o.  twice daily-daily after breakfast and after supper. -He is approached and agrees to monitor blood glucose twice a day-daily before breakfast and at bedtime.   - he is encouraged to call clinic for blood glucose Fleming less than 70 or above 200 mg /dl.  - Specific targets for  A1c;  LDL, HDL,  and Triglycerides were discussed with the patient.  2) Blood Pressure /Hypertension: His blood pressure this morning is stabilized at 138/81.  During his last visit he did have hypoglycemia which necessitated discontinuation of lisinopril.  He will be kept off of lisinopril for now.     3) Lipids/Hyperlipidemia: He does not have recent lipid panel to review.  He is currently on  Crestor 10 mg p.o. nightly.  He is advised to continue the same.     4)  Weight/Diet:  Body mass index is 41.93 kg/m.  -   clearly complicating his diabetes care.   he is  a candidate for weight loss. I discussed with him the fact that loss of 5 - 10% of his  current body weight will have the most impact on his diabetes management.  Exercise, and detailed carbohydrates information provided  -  detailed on discharge instructions.   5) adrenal insufficiency During his recent hospitalization, he was found to have profoundly low basal cortisol at 1.8.  ACTH stimulation test showed inadequate adrenal response with 12.2 at 30 minutes, 9.3 at 60 minutes.  Etiology of her insufficiency is not settled yet.  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    In light of his presentation with hypotension, he will be continued on hydrocortisone support 10 mg p.o. twice daily, and plan to reassess adrenal sufficiency in 60-month.  He will be switched to dexamethasone while undergoing repeat ACTH stimulation test.  6) Chronic Care/Health Maintenance:  -he  is on and Statin medications and  is encouraged to initiate and continue to follow up with Ophthalmology, Dentist,  Podiatrist at least yearly or according to recommendations, and advised to   stay away from smoking. I have recommended yearly flu vaccine and pneumonia vaccine at least every 5 years; moderate intensity exercise for up to 150 minutes weekly; and  sleep for at least 7 hours a day.   POCT ABI Results 09/20/20  His ABI studies normal. Right ABI: 1.21      left ABI: 1.24  Right leg systolic / diastolic: 419/37 mmHg Left leg systolic / diastolic: 902/409 mmHg  Arm systolic / diastolic: 735/32 mmHG   - he is  advised to maintain close follow up with [name], MD for primary care needs, as well as his other providers for optimal and coordinated care.   - Time spent on this patient care encounter:  35  min, of which > 50% was spent in  counseling and the rest reviewing his blood glucose logs , discussing his hypoglycemia and hyperglycemia episodes, reviewing his current and  previous labs / studies  ( including abstraction from other facilities) and medications  doses and developing a  long term treatment plan and documenting his care.   Please refer to Patient Instructions for Blood Glucose Monitoring and Insulin/Medications Dosing Guide"  in media tab for additional information. Please  also refer to " Patient Self Inventory" in the Media  tab for reviewed elements of pertinent patient history.  Shawn Fleming participated in the discussions, expressed understanding, and voiced agreement with the above plans.  All questions were answered to  his satisfaction. he is encouraged to contact clinic should he have any questions or concerns prior to his return visit.   Follow up plan: - Return in about 9 weeks (around 11/22/2020) for Bring Meter and Logs- A1c in Office.  Shawn Lloyd, MD [facility] [address] [place], [place] [address] Phone: [phone]  Fax: [phone]    09/20/2020, 11:11 AM  This note was partially dictated with voice recognition software. Similar sounding words can be transcribed inadequately or may not  be corrected upon review.

## 2020-10-12 ENCOUNTER — Encounter: Payer: Self-pay | Admitting: Nutrition

## 2020-10-12 ENCOUNTER — Other Ambulatory Visit: Payer: Self-pay

## 2020-10-12 ENCOUNTER — Encounter: Attending: "Endocrinology | Admitting: Nutrition

## 2020-10-12 VITALS — Ht 65.0 in | Wt 256.0 lb

## 2020-10-12 DIAGNOSIS — E782 Mixed hyperlipidemia: Secondary | ICD-10-CM

## 2020-10-12 DIAGNOSIS — R739 Hyperglycemia, unspecified: Secondary | ICD-10-CM | POA: Insufficient documentation

## 2020-10-12 NOTE — Patient Instructions (Addendum)
Goals  Follow My Plate Increase fresh fruits and vegetables Eat meals at times discussed during work days Keep drink water only Try Healthy Choices or Smart Ones Aim for fasting blood sugars to less than 150 in am and before you go to bed.

## 2020-10-12 NOTE — Progress Notes (Signed)
  Medical Nutrition Therapy:  Appt start time: 1100 end time:  1200.   Assessment:  Primary concerns today: Diabetes Type 2, Obesity. Dx last November 2020. Has been divorced. Lives with his 3 kids. 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 Has been working on cutting back on eating.  Lantus 40 units daily in am when he gets off work 830 am and eats 2 pieces of bread to prevent low blood sugars he reports.. Metformin 500 mg BID.  Very limited with his ability to eat vegetables. He only likes green beans and slaw. Only eats 1-2 meals per day usually. Had a Cpap machine but it didn't stay on and he hasn't been wearing it. Not physicallyt active. Chronically fatigued. Admits to being depressed and wanting to move away from his wife, which is his biggest stressor.  Current eating habits are inconsistent to meet his needs and provide weight loss and improved blood sugars.  Lab Results  Component Value Date   HGBA1C 12.3 (H) 07/28/2020   CMP Latest Ref Rng & Units 08/01/2020 07/30/2020 07/29/2020  Glucose 70 - 99 mg/dL 235(H) 264(H) 374(H)  BUN 8 - 23 mg/dL 21 39(H) 47(H)  Creatinine 0.61 - 1.24 mg/dL 1.44(H) 1.98(H) 2.18(H)  Sodium 135 - 145 mmol/L 137 138 133(L)  Potassium 3.5 - 5.1 mmol/L 4.0 5.1 5.4(H)  Chloride 98 - 111 mmol/L 105 105 102  CO2 22 - 32 mmol/L 26 25 23   Calcium 8.9 - 10.3 mg/dL 8.0(L) 8.8(L) 8.8(L)  Total Protein 6.5 - 8.1 g/dL - 5.6(L) 5.5(L)  Total Bilirubin 0.3 - 1.2 mg/dL - 0.6 0.6  Alkaline Phos 38 - 126 U/L - 45 48  AST 15 - 41 U/L - 9(L) 10(L)  ALT 0 - 44 U/L - 15 16   Lipid Panel    Preferred Learning Style:  Auditory  Visual  Hands on  Learning Readiness:  Ready  Change in progress   MEDICATIONS:    DIETARY INTAKE:  Eats 2-3 meals per day .Doesn't cook. Eats out a lot or tv dinners.  Usual physical activity: ADL  Estimated energy needs: 1500  calories 170 g carbohydrates 112 g protein 42 g fat  Progress Towards Goal(s):   In progress.   Nutritional Diagnosis:  NB-1.1 Food and nutrition-related knowledge deficit As related to Diabetes Type 2.  As evidenced by A1C 12.4%.    Intervention:  Nutrition and Diabetes education provided on My Plate, CHO counting, meal planning, portion sizes, timing of meals, avoiding snacks between meals unless having a low blood sugar, target ranges for A1C and blood sugars, signs/symptoms and treatment of hyper/hypoglycemia, monitoring blood sugars, taking medications as prescribed, benefits of exercising 30 minutes per day and prevention of complications of DM. Goals  Follow My Plate Increase fresh fruits and vegetables Eat meals at times discussed during work days Keep drink water only Try Healthy Choices or Smart Ones Aim for fasting blood sugars to less than 150 in am and before you go to bed. .  Teaching Method Utilized:  Visual Auditory Hands on  Handouts given during visit include:  The Plate Method   Meal Plan Card  Diabetes Instructions.   Barriers to learning/adherence to lifestyle change: none  Demonstrated degree of understanding via:  Teach Back   Monitoring/Evaluation:  Dietary intake, exercise, and body weight in 1 month(s).

## 2020-11-19 ENCOUNTER — Ambulatory Visit: Admitting: "Endocrinology

## 2020-11-23 ENCOUNTER — Ambulatory Visit: Admitting: Nutrition

## 2021-01-20 ENCOUNTER — Other Ambulatory Visit: Payer: Self-pay

## 2021-01-20 DIAGNOSIS — E2740 Unspecified adrenocortical insufficiency: Secondary | ICD-10-CM

## 2021-01-20 MED ORDER — HYDROCORTISONE 10 MG PO TABS: 10.0000 mg | ORAL_TABLET | Freq: Two times a day (BID) | ORAL | 0 refills | Status: DC

## 2021-04-01 ENCOUNTER — Other Ambulatory Visit: Payer: Self-pay | Admitting: "Endocrinology

## 2021-04-01 DIAGNOSIS — E2740 Unspecified adrenocortical insufficiency: Secondary | ICD-10-CM

## 2021-04-20 ENCOUNTER — Telehealth: Payer: Self-pay

## 2021-04-20 NOTE — Telephone Encounter (Signed)
We will evaluate during his visit, he has to return for a follow up.

## 2021-04-20 NOTE — Telephone Encounter (Signed)
Pt.notified

## 2021-04-20 NOTE — Telephone Encounter (Signed)
Patient would like to know if he still needs to continue the Cortef? He was last seen in December, he said that he probably only checks his sugar 3-4x a week. I advised him he is suppose to be checking it 2x daily before breakfast and before bedtime according to your last note. He is schedule for 2 weeks out so that he can provide you with accurate readings. Please advise.

## 2021-05-03 ENCOUNTER — Other Ambulatory Visit: Payer: Self-pay

## 2021-05-03 ENCOUNTER — Ambulatory Visit (INDEPENDENT_AMBULATORY_CARE_PROVIDER_SITE_OTHER): Payer: Medicare Other | Admitting: "Endocrinology

## 2021-05-03 ENCOUNTER — Encounter: Payer: Self-pay | Admitting: "Endocrinology

## 2021-05-03 VITALS — BP 130/74 | HR 78 | Ht 65.0 in | Wt 272.0 lb

## 2021-05-03 DIAGNOSIS — E1122 Type 2 diabetes mellitus with diabetic chronic kidney disease: Secondary | ICD-10-CM

## 2021-05-03 DIAGNOSIS — E2740 Unspecified adrenocortical insufficiency: Secondary | ICD-10-CM | POA: Diagnosis not present

## 2021-05-03 DIAGNOSIS — E1165 Type 2 diabetes mellitus with hyperglycemia: Secondary | ICD-10-CM

## 2021-05-03 DIAGNOSIS — N182 Chronic kidney disease, stage 2 (mild): Secondary | ICD-10-CM

## 2021-05-03 DIAGNOSIS — E782 Mixed hyperlipidemia: Secondary | ICD-10-CM

## 2021-05-03 DIAGNOSIS — IMO0002 Reserved for concepts with insufficient information to code with codable children: Secondary | ICD-10-CM

## 2021-05-03 LAB — POCT GLYCOSYLATED HEMOGLOBIN (HGB A1C): Hemoglobin A1C: 10.5 % — AB (ref 4.0–5.6)

## 2021-05-03 MED ORDER — LANTUS SOLOSTAR 100 UNIT/ML ~~LOC~~ SOPN: 50.0000 [IU] | PEN_INJECTOR | Freq: Every day | SUBCUTANEOUS | 2 refills | Status: DC

## 2021-05-03 MED ORDER — GLIPIZIDE ER 5 MG PO TB24
5.0000 mg | ORAL_TABLET | Freq: Every day | ORAL | 3 refills | Status: DC
Start: 2021-05-03 — End: 2021-05-19

## 2021-05-03 NOTE — Patient Instructions (Signed)
                                     Advice for Weight Management  -For most of us the best way to lose weight is by diet management. Generally speaking, diet management means consuming less calories intentionally which over time brings about progressive weight loss.  This can be achieved more effectively by restricting carbohydrate consumption to the minimum possible.  So, it is critically important to know your numbers: how much calorie you are consuming and how much calorie you need. More importantly, our carbohydrates sources should be unprocessed or minimally processed complex starch food items.   Sometimes, it is important to balance nutrition by increasing protein intake (animal or plant source), fruits, and vegetables.  -Sticking to a routine mealtime to eat 3 meals a day and avoiding unnecessary snacks is shown to have a big role in weight control. Under normal circumstances, the only time we lose real weight is when we are hungry, so allow hunger to take place- hunger means no food between meal times, only water.  It is not advisable to starve.   -It is better to avoid simple carbohydrates including: Cakes, Sweet Desserts, Ice Cream, Soda (diet and regular), Sweet Tea, Candies, Chips, Cookies, Store Bought Juices, Alcohol in Excess of  1-2 drinks a day, Lemonade,  Artificial Sweeteners, Doughnuts, Coffee Creamers, "Sugar-free" Products, etc, etc.  This is not a complete list.....    -Consulting with certified diabetes educators is proven to provide you with the most accurate and current information on diet.  Also, you may be  interested in discussing diet options/exchanges , we can schedule a visit with Shawn Fleming, RDN, CDE for individualized nutrition education.  -Exercise: If you are able: 30 -60 minutes a day ,4 days a week, or 150 minutes a week.  The longer the better.  Combine stretch, strength, and aerobic activities.  If you were told in the  past that you have high risk for cardiovascular diseases, you may seek evaluation by your heart doctor prior to initiating moderate to intense exercise programs.                                  Additional Care Considerations for Diabetes   -Diabetes  is a chronic disease.  The most important care consideration is regular follow-up with your diabetes care provider with the goal being avoiding or delaying its complications and to take advantage of advances in medications and technology.    -Type 2 diabetes is known to coexist with other important comorbidities such as high blood pressure and high cholesterol.  It is critical to control not only the diabetes but also the high blood pressure and high cholesterol to minimize and delay the risk of complications including coronary artery disease, stroke, amputations, blindness, etc.    - Studies showed that people with diabetes will benefit from a class of medications known as ACE inhibitors and statins.  Unless there are specific reasons not to be on these medications, the standard of care is to consider getting one from these groups of medications at an optimal doses.  These medications are generally considered safe and proven to help protect the heart and the kidneys.    - People with diabetes are encouraged to initiate and maintain regular follow-up with eye doctors, foot   doctors, dentists , and if necessary heart and kidney doctors.     - It is highly recommended that people with diabetes quit smoking or stay away from smoking, and get yearly  flu vaccine and pneumonia vaccine at least every 5 years.  One other important lifestyle recommendation is to ensure adequate sleep - at least 6-7 hours of uninterrupted sleep at night.  -Exercise: If you are able: 30 -60 minutes a day, 4 days a week, or 150 minutes a week.  The longer the better.  Combine stretch, strength, and aerobic activities.  If you were told in the past that you have high risk for  cardiovascular diseases, you may seek evaluation by your heart doctor prior to initiating moderate to intense exercise programs.          

## 2021-05-03 NOTE — Progress Notes (Signed)
05/03/2021, 5:41 PM  Endocrinology follow-up note   Subjective:    Patient ID: Shawn Fleming, male    DOB: 19-Aug-1956.  TREY SANT is being seen in follow-up after he was seen in consultation for management of currently uncontrolled symptomatic diabetes requested by  [name], MD.   Past Medical History:  Diagnosis Date  . AKI (acute kidney injury) ([place]) 07/29/2020  . Arthritis   . GERD (gastroesophageal reflux disease)   . History of kidney stones   . Hypercholesteremia   . Hypertension   . Migraine   . Sleep apnea     Past Surgical History:  Procedure Laterality Date  . ADENOIDECTOMY    . APPENDECTOMY    . CENTRAL VENOUS CATHETER INSERTION Left 04/20/2013   Procedure: INSERTION CENTRAL LINE LEFT SUBCLAVIAN;  Surgeon: Donato Heinz, MD;  Location: [facility];  Service: General;  Laterality: Left;  . INCISION AND DRAINAGE PERIRECTAL ABSCESS N/A 04/20/2013   Procedure: SHARP SURGICAL DEBRIDEMENT PERINEAL INFECTION;  Surgeon: Donato Heinz, MD;  Location: [facility];  Service: General;  Laterality: N/A;  . KNEE SURGERY Right    arthroscopic  . SHOULDER SURGERY Bilateral    removal of bone.  . TONSILLECTOMY      Social History   Socioeconomic History  . Marital status: Married    Spouse name: Not on file  . Number of children: Not on file  . Years of education: Not on file  . Highest education level: Not on file  Occupational History  . Not on file  Tobacco Use  . Smoking status: Former Smoker    Packs/day: 0.50    Years: 23.00    Pack years: 11.50    Types: Cigarettes  . Smokeless tobacco: Never Used  Vaping Use  . Vaping Use: Former  Substance and Sexual Activity  . Alcohol use: Yes    Comment: rare  . Drug use: No  . Sexual activity: Not Currently    Birth control/protection: None  Other Topics Concern  . Not on file  Social History Narrative  . Not on file   Social  Determinants of Health   Financial Resource Strain: Not on file  Food Insecurity: Not on file  Transportation Needs: Not on file  Physical Activity: Not on file  Stress: Not on file  Social Connections: Not on file    Family History  Problem Relation Age of Onset  . Cancer Mother     Outpatient Encounter Medications as of 05/03/2021  Medication Sig  . glipiZIDE (GLUCOTROL XL) 5 MG 24 hr tablet Take 1 tablet (5 mg total) by mouth daily with breakfast.  . alfuzosin (UROXATRAL) 10 MG 24 hr tablet Take 10 mg by mouth daily.  Marland Kitchen apixaban (ELIQUIS) 5 MG TABS tablet Take 1 tablet (5 mg total) by mouth 2 (two) times daily. Please start around August 31, 2020 after you complete the initial starter pack  . Apixaban Starter Pack, 10mg  and 5mg , (ELIQUIS DVT/PE STARTER PACK) Take as directed on package: start with two-5mg  tablets twice daily for 7 days. On day 8, switch to one-5mg  tablet twice daily.  Marland Kitchen buPROPion (WELLBUTRIN XL) 300 MG 24 hr  tablet Take 300 mg by mouth daily.  . Cholecalciferol (VITAMIN D3) 5000 units CAPS Take 2 capsules by mouth daily.   Marland Kitchen gabapentin (NEURONTIN) 300 MG capsule Take 300 mg by mouth 3 (three) times daily.  . hydrocortisone (CORTEF) 10 MG tablet TAKE 1 TABLET TWICE A DAY (EVERY MORNING AT 8 A.M. AND EVERY DAY AT 1 P.M.)  . LANTUS SOLOSTAR 100 UNIT/ML Solostar Pen Inject 50 Units into the skin at bedtime.  . metFORMIN (GLUCOPHAGE) 500 MG tablet Take 500 mg by mouth 2 (two) times daily.  . Multiple Vitamins-Minerals (MULTIVITAMIN PO) Take 1 tablet by mouth daily.  . naloxegol oxalate (MOVANTIK) 25 MG TABS tablet Take 25 mg by mouth daily.  . Oxycodone HCl 10 MG TABS Take 10 mg by mouth 4 (four) times daily.  . pantoprazole (PROTONIX) 40 MG tablet Take 40 mg by mouth daily.  . rosuvastatin (CRESTOR) 10 MG tablet Take 10 mg by mouth daily.  . [DISCONTINUED] LANTUS SOLOSTAR 100 UNIT/ML Solostar Pen Inject 40 Units into the skin at bedtime.   No facility-administered  encounter medications on file as of 05/03/2021.    ALLERGIES: Allergies  Allergen Reactions  . Prednisone     VACCINATION STATUS: There is no immunization history for the selected administration types on file for this patient.  Diabetes He presents for his follow-up diabetic visit. He has type 2 diabetes mellitus. Onset time: He was diagnosed at approximate age of 64 years. His disease course has been improving. Hypoglycemia symptoms include dizziness. Pertinent negatives for hypoglycemia include no confusion, headaches, pallor or seizures. Pertinent negatives for diabetes include no chest pain, no fatigue, no polydipsia, no polyphagia, no polyuria and no weakness. There are no hypoglycemic complications. Symptoms are improving. Diabetic complications include nephropathy and peripheral neuropathy. Risk factors for coronary artery disease include dyslipidemia, diabetes mellitus, family history, obesity, male sex, hypertension, sedentary lifestyle and tobacco exposure. Current diabetic treatments: Is currently on Lantus 30 units nightly, Metformin 500 mg p.o. twice daily. His weight is increasing steadily. He is following a generally unhealthy diet. When asked about meal planning, he reported none. He has not had a previous visit with a dietitian. His home blood glucose trend is decreasing steadily. His breakfast blood glucose range is generally 180-200 mg/dl. His bedtime blood glucose range is generally >200 mg/dl. His overall blood glucose range is >200 mg/dl. (He presents with slight improvement in his glycemic profile.  His A1c at point-of-care today is at 10.5% improving from 12.3%.  No hypoglycemia was documented or reported.   ) An ACE inhibitor/angiotensin II receptor blocker is being taken. Eye exam is current.  Hyperlipidemia This is a chronic problem. The current episode started more than 1 year ago. Exacerbating diseases include chronic renal disease, diabetes and obesity. Pertinent  negatives include no chest pain, myalgias or shortness of breath. Current antihyperlipidemic treatment includes statins. Risk factors for coronary artery disease include diabetes mellitus, dyslipidemia, family history, hypertension, male sex, obesity and a sedentary lifestyle.  Hypertension This is a chronic problem. The current episode started more than 1 year ago. Pertinent negatives include no chest pain, headaches, neck pain, palpitations or shortness of breath. Risk factors for coronary artery disease include diabetes mellitus, dyslipidemia, obesity, male gender, sedentary lifestyle, smoking/tobacco exposure and family history. Past treatments include ACE inhibitors. Hypertensive end-organ damage includes kidney disease. Identifiable causes of hypertension include chronic renal disease.     Review of Systems  Constitutional: Negative for chills, fatigue, fever and unexpected weight change.  HENT: Negative for dental problem, mouth sores and trouble swallowing.   Eyes: Negative for visual disturbance.  Respiratory: Negative for cough, choking, chest tightness, shortness of breath and wheezing.   Cardiovascular: Negative for chest pain, palpitations and leg swelling.  Gastrointestinal: Negative for abdominal distention, abdominal pain, constipation, diarrhea, nausea and vomiting.  Endocrine: Negative for polydipsia, polyphagia and polyuria.  Genitourinary: Negative for dysuria, flank pain, hematuria and urgency.  Musculoskeletal: Negative for back pain, gait problem, myalgias and neck pain.  Skin: Negative for pallor, rash and wound.  Neurological: Positive for dizziness. Negative for seizures, syncope, weakness, numbness and headaches.  Psychiatric/Behavioral: Negative for confusion and dysphoric mood.    Objective:    Vitals with BMI 05/03/2021 10/12/2020 09/20/2020  Height 5\' 5"  5\' 5"  5\' 5"   Weight 272 lbs 256 lbs 252 lbs  BMI 45.26 16.3 84.66  Systolic 599 - 357  Diastolic 74 - 81   Pulse 78 - 97    BP 130/74   Pulse 78   Ht 5\' 5"  (1.651 m)   Wt 272 lb (123.4 kg)   BMI 45.26 kg/m   Wt Readings from Last 3 Encounters:  05/03/21 272 lb (123.4 kg)  10/12/20 256 lb (116.1 kg)  09/20/20 252 lb (114.3 kg)     Physical Exam Constitutional:      General: He is not in acute distress.    Appearance: He is well-developed. He is obese.  HENT:     Head: Normocephalic and atraumatic.  Neck:     Thyroid: No thyromegaly.     Trachea: No tracheal deviation.  Cardiovascular:     Rate and Rhythm: Normal rate.     Pulses:          Dorsalis pedis pulses are 1+ on the right side and 1+ on the left side.       Posterior tibial pulses are 1+ on the right side and 1+ on the left side.     Heart sounds: S1 normal and S2 normal. No murmur heard. No gallop.   Pulmonary:     Effort: Pulmonary effort is normal. No respiratory distress.     Breath sounds: No wheezing.  Abdominal:     General: There is no distension.     Tenderness: There is no abdominal tenderness. There is no guarding.  Musculoskeletal:     Right shoulder: No swelling or deformity.     Cervical back: Normal range of motion and neck supple.  Skin:    General: Skin is warm and dry.     Findings: No rash.     Nails: There is no clubbing.  Neurological:     Mental Status: He is alert and oriented to person, place, and time.     Cranial Nerves: No cranial nerve deficit.     Sensory: No sensory deficit.     Gait: Gait normal.     Deep Tendon Reflexes: Reflexes are normal and symmetric.  Psychiatric:        Speech: Speech normal.        Behavior: Behavior normal. Behavior is cooperative.        Thought Content: Thought content normal.        Judgment: Judgment normal.       CMP ( most recent) CMP     Component Value Date/Time   NA 137 08/01/2020 0758   K 4.0 08/01/2020 0758   CL 105 08/01/2020 0758   CO2 26 08/01/2020 0758   GLUCOSE 235 (H) 08/01/2020  0758   BUN 21 08/01/2020 0758   CREATININE  1.44 (H) 08/01/2020 0758   CALCIUM 8.0 (L) 08/01/2020 0758   PROT 5.6 (L) 07/30/2020 0515   ALBUMIN 3.2 (L) 07/30/2020 0515   AST 9 (L) 07/30/2020 0515   ALT 15 07/30/2020 0515   ALKPHOS 45 07/30/2020 0515   BILITOT 0.6 07/30/2020 0515   GFRNONAA 51 (L) 08/01/2020 0758   GFRAA 59 (L) 08/01/2020 0758     Diabetic Labs (most recent): Lab Results  Component Value Date   HGBA1C 10.5 (A) 05/03/2021   HGBA1C 12.3 (H) 07/28/2020     Lab Results  Component Value Date   TSH 0.622 07/29/2020   TSH 2.456 04/30/2013       Assessment & Plan:   1. 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  He presents with slight improvement in his glycemic profile.  His A1c at point-of-care today is at 10.5% improving from 12.3%.  No hypoglycemia was documented or reported.    - I had a long discussion with him about the progressive nature of diabetes and the pathology behind its complications. -his diabetes is complicated by CKD, obesity/sedentary  and he remains at a high risk for more acute and chronic complications which include CAD, CVA, CKD, retinopathy, and neuropathy. These are all discussed in detail with him.  - I have counseled him on diet  and weight management  by adopting a carbohydrate restricted/protein rich diet. Patient is encouraged to switch to  unprocessed or minimally processed     complex starch and increased protein intake (animal or plant source), fruits, and vegetables. -  he is advised to stick to a routine mealtimes to eat 3 meals  a day and avoid unnecessary snacks ( to snack only to correct hypoglycemia).   - he acknowledges that there is a room for improvement in his food and drink choices. - Suggestion is made for him to avoid simple carbohydrates  from his diet including Cakes, Sweet Desserts, Ice Cream, Soda (diet and regular), Sweet Tea, Candies,  Chips, Cookies, Store Bought Juices, Alcohol in Excess of  1-2 drinks a day, Artificial Sweeteners,  Coffee Creamer, and "Sugar-free" Products, Lemonade. This will help patient to have more stable blood glucose profile and potentially avoid unintended weight gain.     - he will be scheduled with Jearld Fenton, RDN, CDE for diabetes education.  - I have approached him with the following individualized plan to manage  his diabetes and patient agrees:   -In light of his presentation with improving glycemic profile, he will not need prandial insulin for now.  However, he will need a higher dose of basal insulin.  I discussed and increase his Lantus to 50 units nightly, associated with monitoring of blood glucose at least twice a day-daily before breakfast and at bedtime.   -He will continue to benefit from metformin.  He is advised to continue metformin 500 mg p.o. twice daily- daily after breakfast and after supper.    -To give him a better control, he may benefit from low-dose glipizide.  I discussed and added glipizide 5 mg XL p.o. daily at breakfast.     - he is encouraged to call clinic for blood glucose levels less than 70 or above 200 mg /dl.  - Specific targets for  A1c;  LDL, HDL,  and Triglycerides were discussed  with the patient.  2) Blood Pressure /Hypertension: His blood pressure is controlled to target at 130/74.  He was taken off of lisinopril due to marginal blood pressure during prior visit.  3) Lipids/Hyperlipidemia: He does not have recent lipid panel to review.  He has lab appointment with his PMD.  We will request for a copy.  He is advised to continue Crestor 10 mg p.o. nightly.      4)  Weight/Diet:  Body mass index is 45.26 kg/m.  -   clearly complicating his diabetes care.   he is  a candidate for weight loss. I discussed with him the fact that loss of 5 - 10% of his  current body weight will have the most impact on his diabetes management.  Exercise, and detailed  carbohydrates information provided  -  detailed on discharge instructions.   5) Adrenal insufficiency During his recent hospitalization, he was found to have profoundly low basal cortisol at 1.8.  ACTH stimulation test showed inadequate adrenal response with 12.2 at 30 minutes, 9.3 at 60 minutes.  Etiology of her insufficiency is not settled yet.  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    In light of his presentation with hypotension, he will be continued on hydrocortisone 10 mg p.o. twice daily, plan to reassess adrenal sufficiency after next visit   by switching to dexamethasone while undergoing repeat ACTH stimulation test.   6) Chronic Care/Health Maintenance:  -he  is on and Statin medications and  is encouraged to initiate and continue to follow up with Ophthalmology, Dentist,  Podiatrist at least yearly or according to recommendations, and advised to   stay away from smoking. I have recommended yearly flu vaccine and pneumonia vaccine at least every 5 years; moderate intensity exercise for up to 150 minutes weekly; and  sleep for at least 7 hours a day.  His recent ABI was negative for PAD in April 2021.  This study will be repeated in April 2026 or sooner if needed.     - he is  advised to maintain close follow up with [name], MD for primary care needs, as well as his other providers for optimal and coordinated care.    I spent 42 minutes in the care of the patient today including review of labs from [facility] [place], Lipids, Thyroid Function, Hematology (current and previous including abstractions from other facilities); face-to-face time discussing  his blood glucose readings/logs, discussing hypoglycemia and hyperglycemia episodes and symptoms, medications doses, his options of short and long term treatment based on the latest standards of care / guidelines;  discussion about incorporating lifestyle medicine;  and documenting the encounter.     Please refer to Patient Instructions for Blood Glucose Monitoring and Insulin/Medications Dosing Guide"  in media tab for additional information. Please  also refer to " Patient Self Inventory" in the Media  tab for reviewed elements of pertinent patient history.  Iven Finn participated in the discussions, expressed understanding, and voiced agreement with the above plans.  All questions were answered to his satisfaction. he is encouraged to contact clinic should he have any questions or concerns prior to his return visit.    Follow up plan: - Return in about 3 months (around 08/03/2021) for Bring Meter and Logs- A1c in Office.  Glade Lloyd, MD [facility] [address] [place], [place] [address] Phone: [phone]  Fax: [phone]    05/03/2021, 5:41 PM  This  note was partially dictated with voice recognition software. Similar sounding words can be transcribed inadequately or may not  be corrected upon review.

## 2021-05-19 ENCOUNTER — Other Ambulatory Visit: Payer: Self-pay

## 2021-05-19 MED ORDER — GLIPIZIDE ER 5 MG PO TB24: 5.0000 mg | ORAL_TABLET | Freq: Every day | ORAL | 0 refills | Status: DC

## 2021-05-24 ENCOUNTER — Telehealth: Payer: Self-pay

## 2021-05-24 ENCOUNTER — Other Ambulatory Visit: Payer: Self-pay | Admitting: "Endocrinology

## 2021-05-24 DIAGNOSIS — E2740 Unspecified adrenocortical insufficiency: Secondary | ICD-10-CM

## 2021-05-24 MED ORDER — HYDROCORTISONE 10 MG PO TABS: ORAL_TABLET | ORAL | 1 refills | Status: DC

## 2021-05-24 MED ORDER — HYDROCORTISONE 10 MG PO TABS: ORAL_TABLET | ORAL | 0 refills | Status: DC

## 2021-05-24 NOTE — Telephone Encounter (Signed)
Patient notified.   Could you send him in a 90 day supply [facility]?

## 2021-05-24 NOTE — Telephone Encounter (Signed)
done

## 2021-05-24 NOTE — Telephone Encounter (Signed)
Yes, until his next lab assessment.

## 2021-05-24 NOTE — Telephone Encounter (Signed)
Patient is asking if he needs to continue to take hydrocortisone (CORTEF) 10 MG tablet. He has about 3 days left of his prescription.

## 2021-07-06 DIAGNOSIS — F411 Generalized anxiety disorder: Secondary | ICD-10-CM | POA: Insufficient documentation

## 2021-07-30 ENCOUNTER — Other Ambulatory Visit: Payer: Self-pay | Admitting: "Endocrinology

## 2021-07-30 DIAGNOSIS — E2740 Unspecified adrenocortical insufficiency: Secondary | ICD-10-CM

## 2021-08-10 ENCOUNTER — Other Ambulatory Visit: Payer: Self-pay

## 2021-08-10 ENCOUNTER — Ambulatory Visit (INDEPENDENT_AMBULATORY_CARE_PROVIDER_SITE_OTHER): Payer: Medicare Other | Admitting: "Endocrinology

## 2021-08-10 ENCOUNTER — Encounter: Payer: Self-pay | Admitting: "Endocrinology

## 2021-08-10 VITALS — BP 108/82 | HR 84 | Ht 65.0 in | Wt 274.8 lb

## 2021-08-10 DIAGNOSIS — E782 Mixed hyperlipidemia: Secondary | ICD-10-CM | POA: Diagnosis not present

## 2021-08-10 DIAGNOSIS — E1122 Type 2 diabetes mellitus with diabetic chronic kidney disease: Secondary | ICD-10-CM | POA: Diagnosis not present

## 2021-08-10 DIAGNOSIS — E1165 Type 2 diabetes mellitus with hyperglycemia: Secondary | ICD-10-CM | POA: Diagnosis not present

## 2021-08-10 DIAGNOSIS — N182 Chronic kidney disease, stage 2 (mild): Secondary | ICD-10-CM

## 2021-08-10 DIAGNOSIS — E2740 Unspecified adrenocortical insufficiency: Secondary | ICD-10-CM

## 2021-08-10 DIAGNOSIS — IMO0002 Reserved for concepts with insufficient information to code with codable children: Secondary | ICD-10-CM

## 2021-08-10 LAB — POCT GLYCOSYLATED HEMOGLOBIN (HGB A1C): HbA1c, POC (controlled diabetic range): 9.1 % — AB (ref 0.0–7.0)

## 2021-08-10 MED ORDER — HYDROCORTISONE 10 MG PO TABS: 10.0000 mg | ORAL_TABLET | Freq: Every day | ORAL | 3 refills | Status: DC

## 2021-08-10 NOTE — Patient Instructions (Signed)

## 2021-08-10 NOTE — Progress Notes (Signed)
08/10/2021, 4:39 PM  Endocrinology follow-up note   Subjective:    Patient ID: Shawn Fleming, male    DOB: 1956/10/14.  Shawn Fleming is being seen in follow-up after he was seen in consultation for management of currently uncontrolled symptomatic diabetes requested by  Valentino Nose, FNP.   Past Medical History:  Diagnosis Date   AKI (acute kidney injury) ([place]) 07/29/2020   Arthritis    GERD (gastroesophageal reflux disease)    History of kidney stones    Hypercholesteremia    Hypertension    Migraine    Sleep apnea     Past Surgical History:  Procedure Laterality Date   ADENOIDECTOMY     APPENDECTOMY     CENTRAL VENOUS CATHETER INSERTION Left 04/20/2013   Procedure: INSERTION CENTRAL LINE LEFT SUBCLAVIAN;  Surgeon: Donato Heinz, MD;  Location: [facility];  Service: General;  Laterality: Left;   INCISION AND DRAINAGE PERIRECTAL ABSCESS N/A 04/20/2013   Procedure: SHARP SURGICAL DEBRIDEMENT PERINEAL INFECTION;  Surgeon: Donato Heinz, MD;  Location: [facility];  Service: General;  Laterality: N/A;   KNEE SURGERY Right    arthroscopic   SHOULDER SURGERY Bilateral    removal of bone.   TONSILLECTOMY      Social History   Socioeconomic History   Marital status: Married    Spouse name: Not on file   Number of children: Not on file   Years of education: Not on file   Highest education level: Not on file  Occupational History   Not on file  Tobacco Use   Smoking status: Former    Packs/day: 0.50    Years: 23.00    Pack years: 11.50    Types: Cigarettes   Smokeless tobacco: Never  Vaping Use   Vaping Use: Former  Substance and Sexual Activity   Alcohol use: Yes    Comment: rare   Drug use: No   Sexual activity: Not Currently    Birth control/protection: None  Other Topics Concern   Not on file  Social History Narrative   Not on file   Social Determinants of Health   Financial Resource  Strain: Not on file  Food Insecurity: Not on file  Transportation Needs: Not on file  Physical Activity: Not on file  Stress: Not on file  Social Connections: Not on file    Family History  Problem Relation Age of Onset   Cancer Mother     Outpatient Encounter Medications as of 08/10/2021  Medication Sig   insulin glargine (LANTUS) 100 UNIT/ML Solostar Pen Inject 60 Units into the skin at bedtime.   alfuzosin (UROXATRAL) 10 MG 24 hr tablet Take 10 mg by mouth daily.   apixaban (ELIQUIS) 5 MG TABS tablet Take 1 tablet (5 mg total) by mouth 2 (two) times daily. Please start around August 31, 2020 after you complete the initial starter pack   Apixaban Starter Pack, '10mg'$  and '5mg'$ , (ELIQUIS DVT/PE STARTER PACK) Take as directed on package: start with two-'5mg'$  tablets twice daily for 7 days. On day 8, switch to one-'5mg'$  tablet twice daily.   buPROPion (WELLBUTRIN XL) 300 MG 24 hr tablet Take 300 mg by mouth  daily.   Cholecalciferol (VITAMIN D3) 5000 units CAPS Take 2 capsules by mouth daily.    gabapentin (NEURONTIN) 300 MG capsule Take 300 mg by mouth 3 (three) times daily.   glipiZIDE (GLUCOTROL XL) 5 MG 24 hr tablet Take 1 tablet (5 mg total) by mouth daily with breakfast.   hydrocortisone (CORTEF) 10 MG tablet Take 1 tablet (10 mg total) by mouth daily with breakfast.   metFORMIN (GLUCOPHAGE) 500 MG tablet Take 500 mg by mouth 2 (two) times daily.   Multiple Vitamins-Minerals (MULTIVITAMIN PO) Take 1 tablet by mouth daily.   nabumetone (RELAFEN) 750 MG tablet Take 750 mg by mouth 2 (two) times daily.   naloxegol oxalate (MOVANTIK) 25 MG TABS tablet Take 25 mg by mouth daily. (Patient not taking: Reported on 08/10/2021)   Oxycodone HCl 10 MG TABS Take 10 mg by mouth 4 (four) times daily.   pantoprazole (PROTONIX) 40 MG tablet Take 40 mg by mouth daily.   rosuvastatin (CRESTOR) 10 MG tablet Take 10 mg by mouth daily.   [DISCONTINUED] hydrocortisone (CORTEF) 10 MG tablet TAKE 1 TABLET TWICE A  DAY, EVERY MORNING AT 8 A.M. AND EVERY DAY AT 1 P.M. (NEED AN APPOINTMENT FOR ANY FURTHER REFILLS)   [DISCONTINUED] LANTUS SOLOSTAR 100 UNIT/ML Solostar Pen Inject 50 Units into the skin at bedtime.   No facility-administered encounter medications on file as of 08/10/2021.    ALLERGIES: Allergies  Allergen Reactions   Prednisone     VACCINATION STATUS: There is no immunization history for the selected administration types on file for this patient.  Diabetes He presents for his follow-up diabetic visit. He has type 2 diabetes mellitus. Onset time: He was diagnosed at approximate age of 3 years. His disease course has been improving. Hypoglycemia symptoms include dizziness. Pertinent negatives for hypoglycemia include no confusion, headaches, pallor or seizures. Pertinent negatives for diabetes include no chest pain, no fatigue, no polydipsia, no polyphagia, no polyuria and no weakness. There are no hypoglycemic complications. Symptoms are improving. Diabetic complications include nephropathy and peripheral neuropathy. Risk factors for coronary artery disease include dyslipidemia, diabetes mellitus, family history, obesity, male sex, hypertension, sedentary lifestyle and tobacco exposure. Current diabetic treatments: Is currently on Lantus 30 units nightly, Metformin 500 mg p.o. twice daily. His weight is increasing steadily. He is following a generally unhealthy diet. When asked about meal planning, he reported none. He has not had a previous visit with a dietitian. His home blood glucose trend is decreasing steadily. His breakfast blood glucose range is generally 180-200 mg/dl. His bedtime blood glucose range is generally >200 mg/dl. His overall blood glucose range is >200 mg/dl. (He presents with slight improvement in his glycemic profile.  His point-of-care A1c is 9.1%, progressively improving from 12.3%.  He did not document any hypoglycemia.   ) An ACE inhibitor/angiotensin II receptor blocker  is being taken. Eye exam is current.  Hyperlipidemia This is a chronic problem. The current episode started more than 1 year ago. Exacerbating diseases include chronic renal disease, diabetes and obesity. Pertinent negatives include no chest pain, myalgias or shortness of breath. Current antihyperlipidemic treatment includes statins. Risk factors for coronary artery disease include diabetes mellitus, dyslipidemia, family history, hypertension, male sex, obesity and a sedentary lifestyle.  Hypertension This is a chronic problem. The current episode started more than 1 year ago. Pertinent negatives include no chest pain, headaches, neck pain, palpitations or shortness of breath. Risk factors for coronary artery disease include diabetes mellitus, dyslipidemia, obesity, male gender,  sedentary lifestyle, smoking/tobacco exposure and family history. Past treatments include ACE inhibitors. Hypertensive end-organ damage includes kidney disease. Identifiable causes of hypertension include chronic renal disease.    Review of Systems  Constitutional:  Negative for chills, fatigue, fever and unexpected weight change.  HENT:  Negative for dental problem, mouth sores and trouble swallowing.   Eyes:  Negative for visual disturbance.  Respiratory:  Negative for cough, choking, chest tightness, shortness of breath and wheezing.   Cardiovascular:  Negative for chest pain, palpitations and leg swelling.  Gastrointestinal:  Negative for abdominal distention, abdominal pain, constipation, diarrhea, nausea and vomiting.  Endocrine: Negative for polydipsia, polyphagia and polyuria.  Genitourinary:  Negative for dysuria, flank pain, hematuria and urgency.  Musculoskeletal:  Negative for back pain, gait problem, myalgias and neck pain.  Skin:  Negative for pallor, rash and wound.  Neurological:  Positive for dizziness. Negative for seizures, syncope, weakness, numbness and headaches.  Psychiatric/Behavioral:  Negative for  confusion and dysphoric mood.    Objective:    Vitals with BMI 08/10/2021 05/03/2021 10/12/2020  Height '5\' 5"'$  '5\' 5"'$  '5\' 5"'$   Weight 274 lbs 13 oz 272 lbs 256 lbs  BMI 45.73 [id number]  Systolic [id number] [id number] -  Diastolic 82 74 -  Pulse 84 78 -    BP 108/82   Pulse 84   Ht '5\' 5"'$  (1.651 m)   Wt 274 lb 12.8 oz (124.6 kg)   BMI 45.73 kg/m   Wt Readings from Last 3 Encounters:  08/10/21 274 lb 12.8 oz (124.6 kg)  05/03/21 272 lb (123.4 kg)  10/12/20 256 lb (116.1 kg)     Physical Exam Constitutional:      General: He is not in acute distress.    Appearance: He is well-developed. He is obese.  HENT:     Head: Normocephalic and atraumatic.  Neck:     Thyroid: No thyromegaly.     Trachea: No tracheal deviation.  Cardiovascular:     Rate and Rhythm: Normal rate.     Pulses:          Dorsalis pedis pulses are 1+ on the right side and 1+ on the left side.       Posterior tibial pulses are 1+ on the right side and 1+ on the left side.     Heart sounds: S1 normal and S2 normal. No murmur heard.   No gallop.  Pulmonary:     Effort: Pulmonary effort is normal. No respiratory distress.     Breath sounds: No wheezing.  Abdominal:     General: There is no distension.     Tenderness: There is no abdominal tenderness. There is no guarding.  Musculoskeletal:     Right shoulder: No swelling or deformity.     Cervical back: Normal range of motion and neck supple.  Skin:    General: Skin is warm and dry.     Findings: No rash.     Nails: There is no clubbing.  Neurological:     Mental Status: He is alert and oriented to person, place, and time.     Cranial Nerves: No cranial nerve deficit.     Sensory: No sensory deficit.     Gait: Gait normal.     Deep Tendon Reflexes: Reflexes are normal and symmetric.  Psychiatric:        Speech: Speech normal.        Behavior: Behavior normal. Behavior is cooperative.        Thought  Content: Thought content normal.        Judgment: Judgment normal.       CMP ( most recent) CMP     Component Value Date/Time   NA 137 08/01/2020 0758   K 4.0 08/01/2020 0758   CL 105 08/01/2020 0758   CO2 26 08/01/2020 0758   GLUCOSE 235 (H) 08/01/2020 0758   BUN 21 08/01/2020 0758   CREATININE 1.44 (H) 08/01/2020 0758   CALCIUM 8.0 (L) 08/01/2020 0758   PROT 5.6 (L) 07/30/2020 0515   ALBUMIN 3.2 (L) 07/30/2020 0515   AST 9 (L) 07/30/2020 0515   ALT 15 07/30/2020 0515   ALKPHOS 45 07/30/2020 0515   BILITOT 0.6 07/30/2020 0515   GFRNONAA 51 (L) 08/01/2020 0758   GFRAA 59 (L) 08/01/2020 0758     Diabetic Labs (most recent): Lab Results  Component Value Date   HGBA1C 9.1 (A) 08/10/2021   HGBA1C 10.5 (A) 05/03/2021   HGBA1C 12.3 (H) 07/28/2020     Lab Results  Component Value Date   TSH 0.622 07/29/2020   TSH 2.456 04/30/2013       Assessment & Plan:   1. 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 He presents with slight improvement in his glycemic profile.  His point-of-care A1c is 9.1%, progressively improving from 12.3%.  He did not document any hypoglycemia.     - I had a long discussion with him about the progressive nature of diabetes and the pathology behind its complications. -his diabetes is complicated by CKD, obesity/sedentary  and he remains at a high risk for more acute and chronic complications which include CAD, CVA, CKD, retinopathy, and neuropathy. These are all discussed in detail with him.  - I have counseled him on diet  and weight management  by adopting a carbohydrate restricted/protein rich diet. Patient is encouraged to switch to  unprocessed or minimally processed     complex starch and increased protein intake (animal or plant source), fruits, and vegetables. -  he is advised to stick to a routine mealtimes to eat 3 meals  a day and avoid unnecessary snacks ( to snack only to correct  hypoglycemia).   - he acknowledges that there is a room for improvement in his food and drink choices. - Suggestion is made for him to avoid simple carbohydrates  from his diet including Cakes, Sweet Desserts, Ice Cream, Soda (diet and regular), Sweet Tea, Candies, Chips, Cookies, Store Bought Juices, Alcohol in Excess of  1-2 drinks a day, Artificial Sweeteners,  Coffee Creamer, and "Sugar-free" Products, Lemonade. This will help patient to have more stable blood glucose profile and potentially avoid unintended weight gain.   - he will be scheduled with Jearld Fenton, RDN, CDE for diabetes education.  - I have approached him with the following individualized plan to manage  his diabetes and patient agrees:   -In light of his presentation with improving glycemic profile, he will need higher dose of basal insulin.  He hesitates, however he is approached to increase his Lantus to 60 units nightly,   associated with monitoring of blood glucose at least twice a day-daily before breakfast and at bedtime.   -He will continue to benefit from metformin.  He is advised to continue metformin 500 mg p.o. daily-daily after breakfast and after supper.     -He is advised to continue glipizide  5 mg XL p.o. daily at breakfast.      - he is encouraged to call clinic for blood glucose levels less than 70 or above 200 mg /dl.  - Specific targets for  A1c;  LDL, HDL,  and Triglycerides were discussed with the patient.  2) Blood Pressure /Hypertension: His blood pressure is controlled to target.  He will not tolerate any antihypertensive medications including lisinopril for now.    3) Lipids/Hyperlipidemia: He does not have recent lipid panel to review.  He has lab appointment with his PMD.  We will request for a copy.  He is advised to continue Crestor 10 mg p.o. nightly.  Side effects and precautions discussed with him.      4)  Weight/Diet:  Body mass index is 45.73 kg/m.  -   clearly complicating his  diabetes care.   he is  a candidate for weight loss. I discussed with him the fact that loss of 5 - 10% of his  current body weight will have the most impact on his diabetes management.  Exercise, and detailed carbohydrates information provided  -  detailed on discharge instructions.   5) Adrenal insufficiency During his recent hospitalization, he was found to have profoundly low basal cortisol at 1.8.  ACTH stimulation test showed inadequate adrenal response with 12.2 at 30 minutes, 9.3 at 60 minutes.  Etiology of her insufficiency is not settled yet.  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  -This patient would benefit from reassessment.  He is advised to lower his hydrocortisone to 10 mg p.o. only at breakfast in hopes of attempting to taper off his steroids.  He will be reassessed with ACTH stimulation test while on dexamethasone if he is clinically stable.    6) Chronic Care/Health Maintenance:  -he  is on and Statin medications and  is encouraged to initiate and continue to follow up with Ophthalmology, Dentist,  Podiatrist at least yearly or according to recommendations, and advised to   stay away from smoking. I have recommended yearly flu vaccine and pneumonia vaccine at least every 5 years; moderate intensity exercise for up to 150 minutes weekly; and  sleep for at least 7 hours a day.  His recent ABI was negative for PAD in April 2021.  This study will be repeated in April 2026 or sooner if needed.    - he is  advised to maintain close follow up with Valentino Nose, FNP for primary care needs, as well as his other providers for optimal and coordinated care.   I spent 34 minutes in the care of the patient today including review of labs from [facility], Lipids, Thyroid Function, Hematology (current and previous including abstractions from other facilities); face-to-face time discussing  his blood glucose readings/logs, discussing hypoglycemia and  hyperglycemia episodes and symptoms, medications doses, his options of short and long term treatment based on the latest standards of care / guidelines;  discussion about incorporating lifestyle medicine;  and documenting the encounter.    Please refer to Patient Instructions for Blood Glucose Monitoring and Insulin/Medications Dosing Guide"  in media tab for additional information. Please  also refer to " Patient Self Inventory" in the Media  tab for reviewed elements of pertinent patient history.  Iven Finn participated in the discussions, expressed understanding, and voiced agreement with the above plans.  All questions were answered to his satisfaction. he is encouraged to contact clinic should he have any questions or concerns prior  to his return visit.    Follow up plan: - Return in about 4 months (around 12/10/2021) for F/U with Pre-visit Labs, Meter, Logs, A1c here.Glade Lloyd, MD [facility] [address] [place], [place] [address] Phone: [phone]  Fax: [phone]    08/10/2021, 4:39 PM  This note was partially dictated with voice recognition software. Similar sounding words can be transcribed inadequately or may not  be corrected upon review.

## 2021-09-08 ENCOUNTER — Other Ambulatory Visit (HOSPITAL_BASED_OUTPATIENT_CLINIC_OR_DEPARTMENT_OTHER): Payer: Self-pay | Admitting: Family Medicine

## 2021-09-08 ENCOUNTER — Other Ambulatory Visit (HOSPITAL_COMMUNITY): Payer: Self-pay | Admitting: Family Medicine

## 2021-09-08 DIAGNOSIS — M79651 Pain in right thigh: Secondary | ICD-10-CM

## 2021-10-12 DIAGNOSIS — N1831 Chronic kidney disease, stage 3a: Secondary | ICD-10-CM | POA: Insufficient documentation

## 2021-10-13 DIAGNOSIS — E1165 Type 2 diabetes mellitus with hyperglycemia: Secondary | ICD-10-CM | POA: Insufficient documentation

## 2021-10-18 ENCOUNTER — Encounter (INDEPENDENT_AMBULATORY_CARE_PROVIDER_SITE_OTHER): Payer: Self-pay | Admitting: *Deleted

## 2021-10-31 ENCOUNTER — Other Ambulatory Visit: Payer: Self-pay | Admitting: "Endocrinology

## 2021-10-31 DIAGNOSIS — E2740 Unspecified adrenocortical insufficiency: Secondary | ICD-10-CM

## 2021-11-08 DIAGNOSIS — K5900 Constipation, unspecified: Secondary | ICD-10-CM | POA: Insufficient documentation

## 2021-12-17 LAB — COMPREHENSIVE METABOLIC PANEL
ALT: 12 IU/L (ref 0–44)
AST: 12 IU/L (ref 0–40)
Albumin/Globulin Ratio: 2 (ref 1.2–2.2)
Albumin: 4.2 g/dL (ref 3.8–4.8)
Alkaline Phosphatase: 91 IU/L (ref 44–121)
BUN/Creatinine Ratio: 13 (ref 10–24)
BUN: 20 mg/dL (ref 8–27)
Bilirubin Total: <0.2 mg/dL (ref 0.0–1.2)
CO2: 26 mmol/L (ref 20–29)
Calcium: 9.2 mg/dL (ref 8.6–10.2)
Chloride: 98 mmol/L (ref 96–106)
Creatinine, Ser: 1.59 mg/dL — ABNORMAL HIGH (ref 0.76–1.27)
Globulin, Total: 2.1 g/dL (ref 1.5–4.5)
Glucose: 368 mg/dL — ABNORMAL HIGH (ref 70–99)
Potassium: 4.6 mmol/L (ref 3.5–5.2)
Sodium: 139 mmol/L (ref 134–144)
Total Protein: 6.3 g/dL (ref 6.0–8.5)
eGFR: 48 mL/min/{1.73_m2} — ABNORMAL LOW (ref >59)

## 2021-12-17 LAB — T4, FREE: Free T4: 1.36 ng/dL (ref 0.82–1.77)

## 2021-12-17 LAB — TSH: TSH: 3.49 u[IU]/mL (ref 0.450–4.500)

## 2021-12-22 ENCOUNTER — Encounter: Payer: Self-pay | Admitting: "Endocrinology

## 2021-12-22 ENCOUNTER — Ambulatory Visit (INDEPENDENT_AMBULATORY_CARE_PROVIDER_SITE_OTHER): Payer: Medicare Other | Admitting: "Endocrinology

## 2021-12-22 ENCOUNTER — Other Ambulatory Visit: Payer: Self-pay

## 2021-12-22 VITALS — BP 112/74 | HR 84 | Ht 65.0 in | Wt 254.8 lb

## 2021-12-22 DIAGNOSIS — E1169 Type 2 diabetes mellitus with other specified complication: Secondary | ICD-10-CM

## 2021-12-22 DIAGNOSIS — E2740 Unspecified adrenocortical insufficiency: Secondary | ICD-10-CM

## 2021-12-22 DIAGNOSIS — E782 Mixed hyperlipidemia: Secondary | ICD-10-CM | POA: Diagnosis not present

## 2021-12-22 LAB — POCT GLYCOSYLATED HEMOGLOBIN (HGB A1C): HbA1c, POC (controlled diabetic range): 10.7 % — AB (ref 0.0–7.0)

## 2021-12-22 NOTE — Progress Notes (Signed)
12/22/2021, 10:16 AM  Endocrinology follow-up note   Subjective:    Patient ID: Shawn Fleming, male    DOB: 06/24/1956.  Shawn Fleming is being seen in follow-up after he was seen in consultation for management of currently uncontrolled symptomatic diabetes requested by  Valentino Nose, FNP.   Past Medical History:  Diagnosis Date   AKI (acute kidney injury) ([place]) 07/29/2020   Arthritis    GERD (gastroesophageal reflux disease)    History of kidney stones    Hypercholesteremia    Hypertension    Migraine    Sleep apnea     Past Surgical History:  Procedure Laterality Date   ADENOIDECTOMY     APPENDECTOMY     CENTRAL VENOUS CATHETER INSERTION Left 04/20/2013   Procedure: INSERTION CENTRAL LINE LEFT SUBCLAVIAN;  Surgeon: Donato Heinz, MD;  Location: [facility];  Service: General;  Laterality: Left;   INCISION AND DRAINAGE PERIRECTAL ABSCESS N/A 04/20/2013   Procedure: SHARP SURGICAL DEBRIDEMENT PERINEAL INFECTION;  Surgeon: Donato Heinz, MD;  Location: [facility];  Service: General;  Laterality: N/A;   KNEE SURGERY Right    arthroscopic   SHOULDER SURGERY Bilateral    removal of bone.   TONSILLECTOMY      Social History   Socioeconomic History   Marital status: Married    Spouse name: Not on file   Number of children: Not on file   Years of education: Not on file   Highest education level: Not on file  Occupational History   Not on file  Tobacco Use   Smoking status: Former    Packs/day: 0.50    Years: 23.00    Pack years: 11.50    Types: Cigarettes   Smokeless tobacco: Never  Vaping Use   Vaping Use: Former  Substance and Sexual Activity   Alcohol use: Yes    Comment: rare   Drug use: No   Sexual activity: Not Currently    Birth control/protection: None  Other Topics Concern   Not on file  Social History Narrative   Not on file   Social Determinants of Health   Financial Resource  Strain: Not on file  Food Insecurity: Not on file  Transportation Needs: Not on file  Physical Activity: Not on file  Stress: Not on file  Social Connections: Not on file    Family History  Problem Relation Age of Onset   Cancer Mother     Outpatient Encounter Medications as of 12/22/2021  Medication Sig   alfuzosin (UROXATRAL) 10 MG 24 hr tablet Take 10 mg by mouth daily.   apixaban (ELIQUIS) 5 MG TABS tablet Take 1 tablet (5 mg total) by mouth 2 (two) times daily. Please start around August 31, 2020 after you complete the initial starter pack   Apixaban Starter Pack, 10mg  and 5mg , (ELIQUIS DVT/PE STARTER PACK) Take as directed on package: start with two-5mg  tablets twice daily for 7 days. On day 8, switch to one-5mg  tablet twice daily.   buPROPion (WELLBUTRIN XL) 300 MG 24 hr tablet Take 300 mg by mouth daily. (Patient not taking: Reported on 12/22/2021)   Cholecalciferol (VITAMIN D3) 5000 units CAPS Take 2  capsules by mouth daily.    gabapentin (NEURONTIN) 300 MG capsule Take 300 mg by mouth 3 (three) times daily.   glipiZIDE (GLUCOTROL XL) 5 MG 24 hr tablet Take 1 tablet (5 mg total) by mouth daily with breakfast.   hydrocortisone (CORTEF) 10 MG tablet Take 1 tablet (10 mg total) by mouth daily.   insulin glargine (LANTUS) 100 UNIT/ML Solostar Pen Inject 80 Units into the skin at bedtime.   metFORMIN (GLUCOPHAGE) 500 MG tablet Take 500 mg by mouth 2 (two) times daily with a meal.   Multiple Vitamins-Minerals (MULTIVITAMIN PO) Take 1 tablet by mouth daily.   nabumetone (RELAFEN) 750 MG tablet Take 750 mg by mouth 2 (two) times daily.   naloxegol oxalate (MOVANTIK) 25 MG TABS tablet Take 25 mg by mouth daily. (Patient not taking: Reported on 08/10/2021)   Oxycodone HCl 10 MG TABS Take 10 mg by mouth 4 (four) times daily.   pantoprazole (PROTONIX) 40 MG tablet Take 40 mg by mouth daily.   rosuvastatin (CRESTOR) 10 MG tablet Take 10 mg by mouth daily.   No facility-administered  encounter medications on file as of 12/22/2021.    ALLERGIES: Allergies  Allergen Reactions   Prednisone     VACCINATION STATUS: There is no immunization history for the selected administration types on file for this patient.  Diabetes He presents for his follow-up diabetic visit. He has type 2 diabetes mellitus. Onset time: He was diagnosed at approximate age of 24 years. His disease course has been worsening. Hypoglycemia symptoms include dizziness. Pertinent negatives for hypoglycemia include no confusion, headaches, pallor or seizures. Pertinent negatives for diabetes include no chest pain, no fatigue, no polydipsia, no polyphagia, no polyuria and no weakness. There are no hypoglycemic complications. Symptoms are worsening. Diabetic complications include nephropathy and peripheral neuropathy. Risk factors for coronary artery disease include dyslipidemia, diabetes mellitus, family history, obesity, male sex, hypertension, sedentary lifestyle and tobacco exposure. Current diabetic treatment includes insulin injections. His weight is increasing steadily. He is following a generally unhealthy diet. When asked about meal planning, he reported none. He has not had a previous visit with a dietitian. His home blood glucose trend is increasing steadily. His breakfast blood glucose range is generally >200 mg/dl. His bedtime blood glucose range is generally >200 mg/dl. His overall blood glucose range is >200 mg/dl. (Shawn Fleming presents with significantly above target glycemic profile averaging 231.  His point-of-care A1c is 10.7% increasing from 9.1%.  He did not document any hypoglycemia.   ) An ACE inhibitor/angiotensin II receptor blocker is being taken. Eye exam is current.  Hyperlipidemia This is a chronic problem. The current episode started more than 1 year ago. Exacerbating diseases include chronic renal disease, diabetes and obesity. Pertinent negatives include no chest pain, myalgias or shortness of  breath. Current antihyperlipidemic treatment includes statins. Risk factors for coronary artery disease include diabetes mellitus, dyslipidemia, family history, hypertension, male sex, obesity and a sedentary lifestyle.  Hypertension This is a chronic problem. The current episode started more than 1 year ago. Pertinent negatives include no chest pain, headaches, neck pain, palpitations or shortness of breath. Risk factors for coronary artery disease include diabetes mellitus, dyslipidemia, obesity, male gender, sedentary lifestyle, smoking/tobacco exposure and family history. Past treatments include ACE inhibitors. Hypertensive end-organ damage includes kidney disease. Identifiable causes of hypertension include chronic renal disease.    Review of Systems  Constitutional:  Negative for chills, fatigue, fever and unexpected weight change.  HENT:  Negative for dental problem,  mouth sores and trouble swallowing.   Eyes:  Negative for visual disturbance.  Respiratory:  Negative for cough, choking, chest tightness, shortness of breath and wheezing.   Cardiovascular:  Negative for chest pain, palpitations and leg swelling.  Gastrointestinal:  Negative for abdominal distention, abdominal pain, constipation, diarrhea, nausea and vomiting.  Endocrine: Negative for polydipsia, polyphagia and polyuria.  Genitourinary:  Negative for dysuria, flank pain, hematuria and urgency.  Musculoskeletal:  Negative for back pain, gait problem, myalgias and neck pain.  Skin:  Negative for pallor, rash and wound.  Neurological:  Positive for dizziness. Negative for seizures, syncope, weakness, numbness and headaches.  Psychiatric/Behavioral:  Negative for confusion and dysphoric mood.    Objective:    Vitals with BMI 12/22/2021 08/10/2021 05/03/2021  Height 5\' 5"  5\' 5"  5\' 5"   Weight 254 lbs 13 oz 274 lbs 13 oz 272 lbs  BMI 42.4 72.62 03.55  Systolic 974 163 845  Diastolic 74 82 74  Pulse 84 84 78    BP 112/74     Pulse 84    Ht 5\' 5"  (1.651 m)    Wt 254 lb 12.8 oz (115.6 kg)    BMI 42.40 kg/m   Wt Readings from Last 3 Encounters:  12/22/21 254 lb 12.8 oz (115.6 kg)  08/10/21 274 lb 12.8 oz (124.6 kg)  05/03/21 272 lb (123.4 kg)        CMP ( most recent) CMP     Component Value Date/Time   NA 139 12/16/2021 0820   K 4.6 12/16/2021 0820   CL 98 12/16/2021 0820   CO2 26 12/16/2021 0820   GLUCOSE 368 (H) 12/16/2021 0820   GLUCOSE 235 (H) 08/01/2020 0758   BUN 20 12/16/2021 0820   CREATININE 1.59 (H) 12/16/2021 0820   CALCIUM 9.2 12/16/2021 0820   PROT 6.3 12/16/2021 0820   ALBUMIN 4.2 12/16/2021 0820   AST 12 12/16/2021 0820   ALT 12 12/16/2021 0820   ALKPHOS 91 12/16/2021 0820   BILITOT <0.2 12/16/2021 0820   GFRNONAA 51 (L) 08/01/2020 0758   GFRAA 59 (L) 08/01/2020 0758     Diabetic Labs (most recent): Lab Results  Component Value Date   HGBA1C 10.7 (A) 12/22/2021   HGBA1C 9.1 (A) 08/10/2021   HGBA1C 10.5 (A) 05/03/2021     Lab Results  Component Value Date   TSH 3.490 12/16/2021   TSH 0.622 07/29/2020   TSH 2.456 04/30/2013   FREET4 1.36 12/16/2021       Assessment & Plan:   1. 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  He presents with significantly above target glycemic profile averaging 231.  His point-of-care A1c is 10.7% increasing from 9.1%.  He did not document any hypoglycemia.    - I had a long discussion with him about the progressive nature of diabetes and the pathology behind its complications. -his diabetes is complicated by CKD, obesity/sedentary  and he remains at a high risk for more acute and chronic complications which include CAD, CVA, CKD, retinopathy, and neuropathy. These are all discussed in detail with him.  - I have counseled him on diet  and weight management  by adopting a carbohydrate restricted/protein rich  diet. Patient is encouraged to switch to  unprocessed or minimally processed     complex starch and increased protein intake (animal or plant source), fruits, and vegetables. -  he is advised to stick to a routine mealtimes to eat 3 meals  a day and avoid unnecessary snacks ( to snack only to correct hypoglycemia).   - he acknowledges that there is a room for improvement in his food and drink choices. - Suggestion is made for him to avoid simple carbohydrates  from his diet including Cakes, Sweet Desserts, Ice Cream, Soda (diet and regular), Sweet Tea, Candies, Chips, Cookies, Store Bought Juices, Alcohol , Artificial Sweeteners,  Coffee Creamer, and "Sugar-free" Products, Lemonade. This will help patient to have more stable blood glucose profile and potentially avoid unintended weight gain.  The following Lifestyle Medicine recommendations according to [facility]) were discussed and and offered to patient and he  agrees to start the journey:  A. Whole Foods, Plant-Based Nutrition comprising of fruits and vegetables, plant-based proteins, whole-grain carbohydrates was discussed in detail with the patient.   A list for source of those nutrients were also provided to the patient.  Patient will use only water or unsweetened tea for hydration. B.  The need to stay away from risky substances including alcohol, smoking; obtaining 7 to 9 hours of restorative sleep, at least 150 minutes of moderate intensity exercise weekly, the importance of healthy social connections,  and stress management techniques were discussed. C.  A full color page of  Calorie density of various food groups per pound showing examples of each food groups was provided to the patient.   - I have approached him with the following individualized plan to manage  his diabetes and patient agrees:   -He remains hesitant to optimize his treatment.  In light of his presentation with loss of control of glycemic  profile, he will need a higher dose of basal insulin before considering prandial insulin.  Accordingly, he is advised to increase his Lantus to 80 units nightly,   associated with monitoring of blood glucose at least twice a day-daily before breakfast and at bedtime.   -He will continue to benefit from metformin.  He is advised to continue metformin 500 mg p.o. twice daily after breakfast and after supper.  He is also advised to continue glipizide 5 mg XL p.o. daily at breakfast.     - he is encouraged to call clinic for blood glucose levels less than 70 or above 200 mg /dl.  - Specific targets for  A1c;  LDL, HDL,  and Triglycerides were discussed with the patient.  2) Blood Pressure /Hypertension: His blood pressure is controlled to target.  He will not tolerate any antihypertensive medications including lisinopril for now.    3) Lipids/Hyperlipidemia: He does not have recent lipid panel to review.  He has lab appointment with his PMD.  We will request for a copy.  He is advised to continue Crestor 10 mg p.o. nightly.  Side effects and precautions discussed with him.      4)  Weight/Diet:  Body mass index is 42.4 kg/m.  -   clearly complicating his diabetes care.   he is  a candidate for weight loss. I discussed with him the fact that loss of 5 - 10% of his  current body weight will have the most impact on his diabetes management.  Exercise, and detailed carbohydrates information provided  -  detailed on discharge instructions.   5) Adrenal insufficiency During his recent hospitalization, he was found to have profoundly low basal cortisol at 1.8.  ACTH stimulation test showed inadequate adrenal response with 12.2  at 30 minutes, 9.3 at 60 minutes.  Etiology of her insufficiency is not settled yet.  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  -This patient would benefit from reassessment.  He is advised to lower his hydrocortisone to 10 mg p.o. only at  breakfast in hopes of attempting to taper off his steroids.  He will be reassessed with ACTH stimulation test while on dexamethasone if he continues to be clinically stable during subsequent visits.     6) Chronic Care/Health Maintenance:  -he  is on and Statin medications and  is encouraged to initiate and continue to follow up with Ophthalmology, Dentist,  Podiatrist at least yearly or according to recommendations, and advised to   stay away from smoking. I have recommended yearly flu vaccine and pneumonia vaccine at least every 5 years; moderate intensity exercise for up to 150 minutes weekly; and  sleep for at least 7 hours a day.  His recent ABI was negative for PAD in April 2021.  This study will be repeated in April 2026 or sooner if needed.    - he is  advised to maintain close follow up with Valentino Nose, FNP for primary care needs, as well as his other providers for optimal and coordinated care.    I spent 44 minutes in the care of the patient today including review of labs from [place], Lipids, Thyroid Function, Hematology (current and previous including abstractions from other facilities); face-to-face time discussing  his blood glucose readings/logs, discussing hypoglycemia and hyperglycemia episodes and symptoms, medications doses, his options of short and long term treatment based on the latest standards of care / guidelines;  discussion about incorporating lifestyle medicine;  and documenting the encounter.    Please refer to Patient Instructions for Blood Glucose Monitoring and Insulin/Medications Dosing Guide"  in media tab for additional information. Please  also refer to " Patient Self Inventory" in the Media  tab for reviewed elements of pertinent patient history.  Shawn Fleming participated in the discussions, expressed understanding, and voiced agreement with the above plans.  All questions were answered to his satisfaction. he is encouraged to contact clinic should he have  any questions or concerns prior to his return visit.     Follow up plan: - Return in about 3 months (around 03/22/2022) for Bring Meter and Logs- A1c in Office.  Glade Lloyd, MD [facility] [address] [place], [place] [address] Phone: [phone]  Fax: [phone]    12/22/2021, 10:16 AM  This note was partially dictated with voice recognition software. Similar sounding words can be transcribed inadequately or may not  be corrected upon review.

## 2021-12-22 NOTE — Patient Instructions (Signed)

## 2022-01-09 ENCOUNTER — Telehealth: Payer: Self-pay | Admitting: "Endocrinology

## 2022-01-09 NOTE — Telephone Encounter (Signed)
Pt states that the pharmacy keeps refilling his Hydrocortisone and we need to put it on hold. I advised him that he needs to speak with the pharmacy as we have not sent in any since November. Pt said he would speak with pharmacy

## 2022-02-21 ENCOUNTER — Telehealth: Payer: Self-pay | Admitting: "Endocrinology

## 2022-02-21 NOTE — Telephone Encounter (Signed)
Patient called and left a VM and said that he will be going to his PCP for his Diabetic Needs but would still like to see you for Adrenal  ?

## 2022-03-21 ENCOUNTER — Encounter (INDEPENDENT_AMBULATORY_CARE_PROVIDER_SITE_OTHER): Payer: Self-pay | Admitting: *Deleted

## 2022-03-22 ENCOUNTER — Ambulatory Visit (INDEPENDENT_AMBULATORY_CARE_PROVIDER_SITE_OTHER): Payer: Medicare Other | Admitting: "Endocrinology

## 2022-03-22 ENCOUNTER — Encounter: Payer: Self-pay | Admitting: "Endocrinology

## 2022-03-22 VITALS — BP 96/72 | HR 88 | Ht 65.0 in | Wt 235.0 lb

## 2022-03-22 DIAGNOSIS — E2740 Unspecified adrenocortical insufficiency: Secondary | ICD-10-CM | POA: Diagnosis not present

## 2022-03-22 DIAGNOSIS — E782 Mixed hyperlipidemia: Secondary | ICD-10-CM | POA: Diagnosis not present

## 2022-03-22 DIAGNOSIS — E1169 Type 2 diabetes mellitus with other specified complication: Secondary | ICD-10-CM | POA: Diagnosis not present

## 2022-03-22 NOTE — Progress Notes (Signed)
? ?                                                ?     03/22/2022, 10:36 AM ? ?Endocrinology follow-up note ? ? ?Subjective:  ? ? Patient ID: Shawn Fleming, male    DOB: 07/05/1956.  ?Shawn Fleming is being seen in follow-up for adrenal insufficiency.  ?He was also following up for type 2 DM, but he would like to address his diabetes with his PCP.  ?PCP:   Valentino Nose, FNP. ? ? ?Past Medical History:  ?Diagnosis Date  ? AKI (acute kidney injury) ([place]) 07/29/2020  ? Arthritis   ? GERD (gastroesophageal reflux disease)   ? History of kidney stones   ? Hypercholesteremia   ? Hypertension   ? Migraine   ? Sleep apnea   ? ? ?Past Surgical History:  ?Procedure Laterality Date  ? ADENOIDECTOMY    ? APPENDECTOMY    ? CENTRAL VENOUS CATHETER INSERTION Left 04/20/2013  ? Procedure: INSERTION CENTRAL LINE LEFT SUBCLAVIAN;  Surgeon: Donato Heinz, MD;  Location: [facility];  Service: General;  Laterality: Left;  ? INCISION AND DRAINAGE PERIRECTAL ABSCESS N/A 04/20/2013  ? Procedure: SHARP SURGICAL DEBRIDEMENT PERINEAL INFECTION;  Surgeon: Donato Heinz, MD;  Location: [facility];  Service: General;  Laterality: N/A;  ? KNEE SURGERY Right   ? arthroscopic  ? SHOULDER SURGERY Bilateral   ? removal of bone.  ? TONSILLECTOMY    ? ? ?Social History  ? ?Socioeconomic History  ? Marital status: Married  ?  Spouse name: Not on file  ? Number of children: Not on file  ? Years of education: Not on file  ? Highest education level: Not on file  ?Occupational History  ? Not on file  ?Tobacco Use  ? Smoking status: Former  ?  Packs/day: 0.50  ?  Years: 23.00  ?  Pack years: 11.50  ?  Types: Cigarettes  ? Smokeless tobacco: Never  ?Vaping Use  ? Vaping Use: Former  ?Substance and Sexual Activity  ? Alcohol use: Yes  ?  Comment: rare  ? Drug use: No  ? Sexual activity: Not Currently  ?  Birth control/protection: None  ?Other Topics Concern  ? Not on file  ?Social History Narrative  ? Not on file  ? ?Social Determinants of Health   ? ?Financial Resource Strain: Not on file  ?Food Insecurity: Not on file  ?Transportation Needs: Not on file  ?Physical Activity: Not on file  ?Stress: Not on file  ?Social Connections: Not on file  ? ? ?Family History  ?Problem Relation Age of Onset  ? Cancer Mother   ? ? ?Outpatient Encounter Medications as of 03/22/2022  ?Medication Sig  ? alfuzosin (UROXATRAL) 10 MG 24 hr tablet Take 10 mg by mouth daily.  ? apixaban (ELIQUIS) 5 MG TABS tablet Take 1 tablet (5 mg total) by mouth 2 (two) times daily. Please start around August 31, 2020 after you complete the initial starter pack  ? Apixaban Starter Pack, '10mg'$  and '5mg'$ , (ELIQUIS DVT/PE STARTER PACK) Take as directed on package: start with two-'5mg'$  tablets twice daily for 7 days. On day 8, switch to one-'5mg'$  tablet twice daily.  ? Cholecalciferol (VITAMIN D3) 5000 units CAPS Take 2 capsules by mouth daily.   ? gabapentin (NEURONTIN) 300  MG capsule Take 300 mg by mouth 3 (three) times daily.  ? glipiZIDE (GLUCOTROL XL) 5 MG 24 hr tablet Take 1 tablet (5 mg total) by mouth daily with breakfast.  ? hydrocortisone (CORTEF) 10 MG tablet Take 1 tablet (10 mg total) by mouth daily.  ? insulin glargine (LANTUS) 100 UNIT/ML Solostar Pen Inject 60 Units into the skin at bedtime.  ? Multiple Vitamins-Minerals (MULTIVITAMIN PO) Take 1 tablet by mouth daily.  ? Oxycodone HCl 10 MG TABS Take 10 mg by mouth 4 (four) times daily.  ? pantoprazole (PROTONIX) 40 MG tablet Take 40 mg by mouth daily.  ? rosuvastatin (CRESTOR) 10 MG tablet Take 10 mg by mouth daily.  ? TRULICITY 1.5 NU/2.7OZ SOPN SMARTSIG:0.5 Milliliter(s) SUB-Q Once a Week  ? [DISCONTINUED] buPROPion (WELLBUTRIN XL) 300 MG 24 hr tablet Take 300 mg by mouth daily. (Patient not taking: Reported on 12/22/2021)  ? [DISCONTINUED] metFORMIN (GLUCOPHAGE) 500 MG tablet Take 500 mg by mouth 2 (two) times daily with a meal. (Patient not taking: Reported on 03/22/2022)  ? [DISCONTINUED] nabumetone (RELAFEN) 750 MG tablet Take 750 mg  by mouth 2 (two) times daily.  ? [DISCONTINUED] naloxegol oxalate (MOVANTIK) 25 MG TABS tablet Take 25 mg by mouth daily. (Patient not taking: Reported on 08/10/2021)  ? ?No facility-administered encounter medications on file as of 03/22/2022.  ? ? ?ALLERGIES: ?Allergies  ?Allergen Reactions  ? Prednisone   ? ? ?VACCINATION STATUS: ?There is no immunization history for the selected administration types on file for this patient. ? ?Hyperlipidemia ?This is a chronic problem. The current episode started more than 1 year ago. The problem is uncontrolled. Exacerbating diseases include chronic renal disease, diabetes and obesity. Pertinent negatives include no chest pain, myalgias or shortness of breath. Current antihyperlipidemic treatment includes statins. Risk factors for coronary artery disease include diabetes mellitus, dyslipidemia, family history, hypertension, male sex, obesity and a sedentary lifestyle.  ?Hypertension ?This is a chronic problem. The current episode started more than 1 year ago. Pertinent negatives include no chest pain, headaches, neck pain, palpitations or shortness of breath. Risk factors for coronary artery disease include diabetes mellitus, dyslipidemia, obesity, male gender, sedentary lifestyle, smoking/tobacco exposure and family history. Past treatments include ACE inhibitors. Hypertensive end-organ damage includes kidney disease. Identifiable causes of hypertension include chronic renal disease.  ? 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 He is required to stay on partial replacement dose glucocorticoid with HC '10mg'$  po qam. He does not have acute symptoms today , but was found yo have marginal BP at 96/72. He is not on BP meds. ? ?Review of Systems  ?Constitutional:  Negative for chills, fatigue, fever and unexpected weight change.  ?HENT:  Negative for dental problem, mouth sores and trouble swallowing.   ?Eyes:  Negative for visual disturbance.   ?Respiratory:  Negative for cough, choking, chest tightness, shortness of breath and wheezing.   ?Cardiovascular:  Negative for chest pain, palpitations and leg swelling.  ?Gastrointestinal:  Negative for abdominal distention, abdominal pain, constipation, diarrhea, nausea and vomiting.  ?Endocrine: Negative for polydipsia, polyphagia and polyuria.  ?Genitourinary:  Negative for dysuria, flank pain, hematuria and urgency.  ?Musculoskeletal:  Negative for back pain, gait problem, myalgias and neck pain.  ?Skin:  Negative for pallor, rash and wound.  ?Neurological:  Positive for dizziness. Negative for seizures, syncope, weakness, numbness and headaches.  ?Psychiatric/Behavioral:  Negative for confusion and dysphoric mood.   ? ?Objective:  ?  ? ?  03/22/2022  ?  8:12 AM 12/22/2021  ?  8:43 AM 08/10/2021  ?  2:16 PM  ?Vitals with BMI  ?Height '5\' 5"'$  '5\' 5"'$  '5\' 5"'$   ?Weight 235 lbs 254 lbs 13 oz 274 lbs 13 oz  ?BMI 39.11 42.4 45.73  ?Systolic 96 782 423  ?Diastolic 72 74 82  ?Pulse 88 84 84  ? ? ?BP 96/72   Pulse 88   Ht '5\' 5"'$  (1.651 m)   Wt 235 lb (106.6 kg)   BMI 39.11 kg/m?   ?Wt Readings from Last 3 Encounters:  ?03/22/22 235 lb (106.6 kg)  ?12/22/21 254 lb 12.8 oz (115.6 kg)  ?08/10/21 274 lb 12.8 oz (124.6 kg)  ?  ? ? ? ? ?CMP ( most recent) ?CMP  ?   ?Component Value Date/Time  ? NA 139 12/16/2021 0820  ? K 4.6 12/16/2021 0820  ? CL 98 12/16/2021 0820  ? CO2 26 12/16/2021 0820  ? GLUCOSE 368 (H) 12/16/2021 0820  ? GLUCOSE 235 (H) 08/01/2020 0758  ? BUN 20 12/16/2021 0820  ? CREATININE 1.59 (H) 12/16/2021 0820  ? CALCIUM 9.2 12/16/2021 0820  ? PROT 6.3 12/16/2021 0820  ? ALBUMIN 4.2 12/16/2021 0820  ? AST 12 12/16/2021 0820  ? ALT 12 12/16/2021 0820  ? ALKPHOS 91 12/16/2021 0820  ? BILITOT <0.2 12/16/2021 0820  ? GFRNONAA 51 (L) 08/01/2020 0758  ? GFRAA 59 (L) 08/01/2020 0758  ? ? ? ?Diabetic Labs (most recent): ?Lab Results  ?Component Value Date  ? HGBA1C 10.7 (A) 12/22/2021  ? HGBA1C 9.1 (A) 08/10/2021  ? HGBA1C  10.5 (A) 05/03/2021  ? ? ? ?Lab Results  ?Component Value Date  ? TSH 3.490 12/16/2021  ? TSH 0.622 07/29/2020  ? TSH 2.456 04/30/2013  ? FREET4 1.36 12/16/2021  ?  ? ? ? ?Assessment & Plan:  ? ?1. Uncontroll

## 2022-06-10 ENCOUNTER — Emergency Department (HOSPITAL_COMMUNITY): Payer: Medicare Other

## 2022-06-10 ENCOUNTER — Emergency Department (HOSPITAL_COMMUNITY)
Admission: EM | Admit: 2022-06-10 | Discharge: 2022-06-10 | Disposition: A | Discharge status: Home / Self Care | Payer: Medicare Other | Attending: Emergency Medicine | Admitting: Emergency Medicine

## 2022-06-10 ENCOUNTER — Other Ambulatory Visit: Payer: Self-pay

## 2022-06-10 ENCOUNTER — Encounter (HOSPITAL_COMMUNITY): Payer: Self-pay | Admitting: *Deleted

## 2022-06-10 DIAGNOSIS — Z794 Long term (current) use of insulin: Secondary | ICD-10-CM | POA: Insufficient documentation

## 2022-06-10 DIAGNOSIS — I10 Essential (primary) hypertension: Secondary | ICD-10-CM | POA: Insufficient documentation

## 2022-06-10 DIAGNOSIS — M7732 Calcaneal spur, left foot: Secondary | ICD-10-CM | POA: Diagnosis not present

## 2022-06-10 DIAGNOSIS — Z7901 Long term (current) use of anticoagulants: Secondary | ICD-10-CM | POA: Diagnosis not present

## 2022-06-10 DIAGNOSIS — M7752 Other enthesopathy of left foot: Secondary | ICD-10-CM

## 2022-06-10 DIAGNOSIS — M79672 Pain in left foot: Secondary | ICD-10-CM | POA: Diagnosis present

## 2022-06-10 LAB — CBG MONITORING, ED: Glucose-Capillary: 118 mg/dL — ABNORMAL HIGH (ref 70–99)

## 2022-06-10 MED ORDER — HYDROCODONE-ACETAMINOPHEN 5-325 MG PO TABS: 1.0000 | ORAL_TABLET | Freq: Four times a day (QID) | ORAL | 0 refills | Status: DC | PRN

## 2022-06-10 NOTE — ED Provider Notes (Signed)
[facility] EMERGENCY DEPARTMENT Provider Note   CSN: [id number] Arrival date & time: 06/10/22  1229     History  Chief Complaint  Patient presents with   Foot Pain    Shawn Fleming is a 66 y.o. male with medical history significant for arthritis, kidney stones, hypercholesterolemia, hypertension, sleep apnea.  Patient presents to ED for evaluation of left foot pain.  Patient states that 23 years ago ago his left foot began aching.  Patient reports that he broke his ankle in 1975 while in the [employer].  Patient reports that in 2019 he was evaluated by orthopedics and advised he had possible talonavicular arthritis in his left foot.  The patient states that the pain is been lingering ever since, he has been dealing the pain with pain medication that he receives through his PCP.  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  The patient states that last night he walked a very long distance and this increased his left foot pain causing him to present to ED today. The patient denies using ibuprofen or Tylenol for pain.  Patient denies any numbness or tingling to the foot, denies any trauma to the foot.  Patient denies any calf pain, chest pain, shortness of breath.  Patient denies any recent travel.   Foot Pain Pertinent negatives include no chest pain and no shortness of breath.       Home Medications Prior to Admission medications   Medication Sig Start Date End Date Taking? Authorizing Provider  HYDROcodone-acetaminophen (NORCO/VICODIN) 5-325 MG tablet Take 1 tablet by mouth every 6 (six) hours as needed for severe pain. 06/10/22  Yes Azucena Cecil, PA-C  alfuzosin (UROXATRAL) 10 MG 24 hr tablet Take 10 mg by mouth daily. 08/24/20   [provider]  apixaban (ELIQUIS) 5 MG TABS tablet Take 1 tablet (5 mg total) by mouth 2 (two) times daily. Please start around August 31, 2020 after you  complete the initial starter pack 08/31/20   Roxan Hockey, MD  Apixaban Starter Pack, '10mg'$  and '5mg'$ , (ELIQUIS DVT/PE STARTER PACK) Take as directed on package: start with two-'5mg'$  tablets twice daily for 7 days. On day 8, switch to one-'5mg'$  tablet twice daily. 08/01/20   Roxan Hockey, MD  Cholecalciferol (VITAMIN D3) 5000 units CAPS Take 2 capsules by mouth daily.     [provider]  gabapentin (NEURONTIN) 300 MG capsule Take 300 mg by mouth 3 (three) times daily. 06/15/20   [provider]  glipiZIDE (GLUCOTROL XL) 5 MG 24 hr tablet Take 1 tablet (5 mg total) by mouth daily with breakfast. 05/19/21   Nida, Marella Chimes, MD  hydrocortisone (CORTEF) 10 MG tablet Take 1 tablet (10 mg total) by mouth daily. 10/31/21   Cassandria Anger, MD  insulin glargine (LANTUS) 100 UNIT/ML Solostar Pen Inject 60 Units into the skin at bedtime.    [provider]  Multiple Vitamins-Minerals (MULTIVITAMIN PO) Take 1 tablet by mouth daily.    [provider]  Oxycodone HCl 10 MG TABS Take 10 mg by mouth 4 (four) times daily. 07/05/20   [provider]  pantoprazole (PROTONIX) 40 MG tablet Take 40 mg by mouth daily. 03/21/20   [provider]  rosuvastatin (CRESTOR) 10 MG tablet Take 10 mg by mouth daily.    [provider]  TRULICITY 1.5 VE/9.3YB SOPN SMARTSIG:0.5 Milliliter(s) SUB-Q Once a Week 02/17/22  [provider]      Allergies    Prednisone    Review of Systems   Review of Systems  Respiratory:  Negative for shortness of breath.   Cardiovascular:  Negative for chest pain.  Musculoskeletal:  Positive for myalgias.  All other systems reviewed and are negative.   Physical Exam Updated Vital Signs BP 109/65 (BP Location: Right Arm)   Pulse 96   Temp 98.6 F (37 C) (Oral)   Resp 20   Ht '5\' 5"'$  (1.651 m)   Wt 102.1 kg   SpO2 96%   BMI 37.44 kg/m  Physical Exam Vitals and nursing note reviewed.  Constitutional:       General: He is not in acute distress.    Appearance: Normal appearance. He is not ill-appearing, toxic-appearing or diaphoretic.  HENT:     Head: Normocephalic and atraumatic.     Nose: Nose normal. No congestion.     Mouth/Throat:     Mouth: Mucous membranes are moist.     Pharynx: Oropharynx is clear.  Eyes:     Extraocular Movements: Extraocular movements intact.     Conjunctiva/sclera: Conjunctivae normal.     Pupils: Pupils are equal, round, and reactive to light.  Cardiovascular:     Rate and Rhythm: Normal rate and regular rhythm.  Pulmonary:     Effort: Pulmonary effort is normal.     Breath sounds: Normal breath sounds. No wheezing.  Abdominal:     General: Abdomen is flat. Bowel sounds are normal.     Palpations: Abdomen is soft.     Tenderness: There is no abdominal tenderness.  Musculoskeletal:     Cervical back: Normal range of motion and neck supple. No tenderness.       Feet:  Skin:    General: Skin is warm and dry.     Capillary Refill: Capillary refill takes less than 2 seconds.  Neurological:     Mental Status: He is alert and oriented to person, place, and time.     ED Results / Procedures / Treatments   Labs (all labs ordered are listed, but only abnormal results are displayed) Labs Reviewed  CBG MONITORING, ED - Abnormal; Notable for the following components:      Result Value   Glucose-Capillary 118 (*)    All other components within normal limits    EKG None  Radiology DG Foot Complete Left  Result Date: 06/10/2022 CLINICAL DATA:  Foot pain EXAM: LEFT FOOT - COMPLETE 3+ VIEW COMPARISON:  Left foot 03/21/2018 FINDINGS: Negative for fracture.  No acute skeletal abnormality Advanced degenerative change at the talonavicular joint with joint space narrowing and extensive spurring. 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 Electronically Signed   By: Franchot Gallo M.D.   On: 06/10/2022  13:28    Procedures Procedures   Medications Ordered in ED Medications - No data to display  ED Course/ Medical Decision Making/ A&P                           Medical Decision Making Amount and/or Complexity of Data Reviewed Radiology: ordered.   66 year old male presents to ED for evaluation of left foot pain.  Please see HPI for further details.  On examination, the patient's left foot has a 2+ DP pulse.  The patient left ankle has appropriate range of motion.  There is no overlying skin change about the left  foot incision cellulitis.  Patient has intact sensation and strength to his left foot.  Plain film imaging of patient left foot shows bone spurring at the talonavicular joint which is consistent with chart review, history of arthritis of talonavicular joint.  Patient advised to begin NSAID therapy, alternate with Tylenol.  The patient also was advised and encouraged to follow back up with his orthopedic doctor he saw in 2019.  Patient will be given short supply of pain medication take at home until he is seen by orthopedics.  Patient voiced agreement with plan, patient amenable to the plan.  Patient had return precautions given and he voiced understanding.  The patient had all his questions answered to his satisfaction.  The patient is stable at this time for discharge home.  Final Clinical Impression(s) / ED Diagnoses Final diagnoses:  Bone spur of left foot    Rx / DC Orders ED Discharge Orders          Ordered    HYDROcodone-acetaminophen (NORCO/VICODIN) 5-325 MG tablet  Every 6 hours PRN        06/10/22 1401              Azucena Cecil, PA-C 06/10/22 1402    Fredia Sorrow, MD 06/17/22 [phone]

## 2022-06-30 NOTE — Progress Notes (Deleted)
06/30/2022 Shawn Fleming [id number] [birth year]  Referring provider: Valentino Nose, FNP Primary GI doctor: {acdocs:27040}  ASSESSMENT AND PLAN:   There are no diagnoses linked to this encounter.   Patient Care Team: Valentino Nose, FNP as PCP - General (Family Medicine)  HISTORY OF PRESENT ILLNESS: 66 y.o. male with a past medical history of DM 2, adrenal insuffiencey, and others listed below presents to discuss a colonoscopy.  Patient {Actions; denies-reports:120008} GERD.  Patient denies dysphagia, nausea, vomiting, melena.  Patient has a BM every ***.  Patient denies change in bowel habits, constipation, diarrhea, hematochezia.  Denies changes in appetite, unintentional weight loss.  Patient {Actions; denies-reports:120008} family history of colon cancer or other gastrointestinal malignancies. .    Current Medications:   Current Outpatient Medications (Endocrine & Metabolic):    glipiZIDE (GLUCOTROL XL) 5 MG 24 hr tablet, Take 1 tablet (5 mg total) by mouth daily with breakfast.   hydrocortisone (CORTEF) 10 MG tablet, Take 1 tablet (10 mg total) by mouth daily.   insulin glargine (LANTUS) 100 UNIT/ML Solostar Pen, Inject 60 Units into the skin at bedtime.   TRULICITY 1.5 EH/6.3JS SOPN, SMARTSIG:0.5 Milliliter(s) SUB-Q Once a Week  Current Outpatient Medications (Cardiovascular):    rosuvastatin (CRESTOR) 10 MG tablet, Take 10 mg by mouth daily.   Current Outpatient Medications (Analgesics):    HYDROcodone-acetaminophen (NORCO/VICODIN) 5-325 MG tablet, Take 1 tablet by mouth every 6 (six) hours as needed for severe pain.   Oxycodone HCl 10 MG TABS, Take 10 mg by mouth 4 (four) times daily.  Current Outpatient Medications (Hematological):    apixaban (ELIQUIS) 5 MG TABS tablet, Take 1 tablet (5 mg total) by mouth 2 (two) times daily. Please start around August 31, 2020 after you complete the initial starter pack   Apixaban Starter Pack, '10mg'$  and '5mg'$ , (ELIQUIS  DVT/PE STARTER PACK), Take as directed on package: start with two-'5mg'$  tablets twice daily for 7 days. On day 8, switch to one-'5mg'$  tablet twice daily.  Current Outpatient Medications (Other):    alfuzosin (UROXATRAL) 10 MG 24 hr tablet, Take 10 mg by mouth daily.   Cholecalciferol (VITAMIN D3) 5000 units CAPS, Take 2 capsules by mouth daily.    gabapentin (NEURONTIN) 300 MG capsule, Take 300 mg by mouth 3 (three) times daily.   Multiple Vitamins-Minerals (MULTIVITAMIN PO), Take 1 tablet by mouth daily.   pantoprazole (PROTONIX) 40 MG tablet, Take 40 mg by mouth daily.  Medical History:  Past Medical History:  Diagnosis Date   AKI (acute kidney injury) ([place]) 07/29/2020   Arthritis    Diabetes ([facility])    GERD (gastroesophageal reflux disease)    History of kidney stones    Hypercholesteremia    Hypertension    Migraine    Sleep apnea    Allergies:  Allergies  Allergen Reactions   Prednisone      Surgical History:  He  has a past surgical history that includes Shoulder surgery (Bilateral); Knee surgery (Right); Appendectomy; Tonsillectomy; Adenoidectomy; Incision and drainage perirectal abscess (N/A, 04/20/2013); and Central venous catheter insertion (Left, 04/20/2013). Family History:  His family history includes Cancer in his mother. Social History:   reports that he has quit smoking. His smoking use included cigarettes. He has a 11.50 pack-year smoking history. He has never used smokeless tobacco. He reports current alcohol use. He reports that he does not use drugs.  REVIEW OF SYSTEMS  : All other systems reviewed and negative except where noted in the History of  Present Illness.   PHYSICAL EXAM: There were no vitals taken for this visit. General:   Pleasant, well developed male in no acute distress Head:   Normocephalic and atraumatic. Eyes:  {sclerae:26738},conjunctive {conjuctiva:26739}  Heart:   {HEART EXAM HEM/ONC:21750} Pulm:  Clear anteriorly; no wheezing Abdomen:    {BlankSingle:19197::"Distended","Ridged","Soft"}, {BlankSingle:19197::"Flat","Obese","Non-distended"} AB, {BlankSingle:19197::"Absent","Hyperactive, tinkling","Hypoactive","Sluggish","Active"} bowel sounds. {actendernessAB:27319} tenderness {anatomy; site abdomen:5010}. {BlankMultiple:19196::"Without guarding","With guarding","Without rebound","With rebound"}, No organomegaly appreciated. Rectal: {acrectalexam:27461} Extremities:  {With/Without:304960234} edema. Msk: Symmetrical without gross deformities. Peripheral pulses intact.  Neurologic:  Alert and  oriented x4;  No focal deficits.  Skin:   Dry and intact without significant lesions or rashes. Psychiatric:  Cooperative. Normal mood and affect.    [name], PA-C 3:47 PM

## 2022-07-03 ENCOUNTER — Ambulatory Visit: Payer: Medicare Other | Admitting: Physician Assistant

## 2022-07-26 ENCOUNTER — Other Ambulatory Visit: Payer: Self-pay | Admitting: "Endocrinology

## 2022-07-27 ENCOUNTER — Other Ambulatory Visit: Payer: Self-pay

## 2022-07-27 DIAGNOSIS — E2740 Unspecified adrenocortical insufficiency: Secondary | ICD-10-CM

## 2022-07-27 MED ORDER — HYDROCORTISONE 10 MG PO TABS: 10.0000 mg | ORAL_TABLET | Freq: Every day | ORAL | 0 refills | Status: DC

## 2022-08-01 NOTE — Progress Notes (Deleted)
08/01/2022 Shawn Fleming [id number] 07-21-1956  Referring provider: Valentino Nose, FNP Primary GI doctor: {acdocs:27040}  ASSESSMENT AND PLAN:   There are no diagnoses linked to this encounter.   Patient Care Team: Shawn Nose, FNP as PCP - General (Family Medicine)  HISTORY OF PRESENT ILLNESS: 66 y.o. male with a past medical history of diabetes with CKD, HTN, chol, migraines, OSA, adrenal insuff and others listed below presents for evaluation of ***.  Patient is on eliquis for Patient is on trulicity for his BM, has opioids listed for chronic pain.   Patient {Actions; denies-reports:120008} GERD.  Patient denies dysphagia, nausea, vomiting, melena.  Patient has a BM every ***.  Patient denies change in bowel habits, constipation, diarrhea, hematochezia.  Denies changes in appetite, unintentional weight loss.  Patient denies family history of colon cancer or other gastrointestinal malignancies.   He  reports that he has quit smoking. His smoking use included cigarettes. He has a 11.50 pack-year smoking history. He has never used smokeless tobacco. He reports current alcohol use. He reports that he does not use drugs.  Current Medications:   Current Outpatient Medications (Endocrine & Metabolic):    GLIPIZIDE XL 5 MG 24 hr tablet, TAKE 1 TABLET DAILY WITH BREAKFAST   hydrocortisone (CORTEF) 10 MG tablet, Take 1 tablet (10 mg total) by mouth daily.   insulin glargine (LANTUS) 100 UNIT/ML Solostar Pen, Inject 60 Units into the skin at bedtime.   TRULICITY 1.5 DG/6.4QI SOPN, SMARTSIG:0.5 Milliliter(s) SUB-Q Once a Week  Current Outpatient Medications (Cardiovascular):    rosuvastatin (CRESTOR) 10 MG tablet, Take 10 mg by mouth daily.   Current Outpatient Medications (Analgesics):    HYDROcodone-acetaminophen (NORCO/VICODIN) 5-325 MG tablet, Take 1 tablet by mouth every 6 (six) hours as needed for severe pain.   Oxycodone HCl 10 MG TABS, Take 10 mg by mouth 4 (four)  times daily.  Current Outpatient Medications (Hematological):    apixaban (ELIQUIS) 5 MG TABS tablet, Take 1 tablet (5 mg total) by mouth 2 (two) times daily. Please start around August 31, 2020 after you complete the initial starter pack   Apixaban Starter Pack, '10mg'$  and '5mg'$ , (ELIQUIS DVT/PE STARTER PACK), Take as directed on package: start with two-'5mg'$  tablets twice daily for 7 days. On day 8, switch to one-'5mg'$  tablet twice daily.  Current Outpatient Medications (Other):    alfuzosin (UROXATRAL) 10 MG 24 hr tablet, Take 10 mg by mouth daily.   Cholecalciferol (VITAMIN D3) 5000 units CAPS, Take 2 capsules by mouth daily.    gabapentin (NEURONTIN) 300 MG capsule, Take 300 mg by mouth 3 (three) times daily.   Multiple Vitamins-Minerals (MULTIVITAMIN PO), Take 1 tablet by mouth daily.   pantoprazole (PROTONIX) 40 MG tablet, Take 40 mg by mouth daily.  Medical History:  Past Medical History:  Diagnosis Date   AKI (acute kidney injury) ([place]) 07/29/2020   Arthritis    Diabetes (HCC)    GERD (gastroesophageal reflux disease)    History of kidney stones    Hypercholesteremia    Hypertension    Migraine    Sleep apnea    Allergies:  Allergies  Allergen Reactions   Prednisone      Surgical History:  He  has a past surgical history that includes Shoulder surgery (Bilateral); Knee surgery (Right); Appendectomy; Tonsillectomy; Adenoidectomy; Incision and drainage perirectal abscess (N/A, 04/20/2013); and Central venous catheter insertion (Left, 04/20/2013). Family History:  His family history includes Cancer in his mother.  REVIEW OF SYSTEMS  :  All other systems reviewed and negative except where noted in the History of Present Illness.  PHYSICAL EXAM: There were no vitals taken for this visit. General:   Pleasant, well developed male in no acute distress Head:   Normocephalic and atraumatic. Eyes:  sclerae anicteric,conjunctive pink  Heart:   {HEART EXAM HEM/ONC:21750} Pulm:  Clear  anteriorly; no wheezing Abdomen:   {BlankSingle:19197::"Distended","Ridged","Soft"}, {BlankSingle:19197::"Flat","Obese","Non-distended"} AB, {BlankSingle:19197::"Absent","Hyperactive, tinkling","Hypoactive","Sluggish","Active"} bowel sounds. {actendernessAB:27319} tenderness {anatomy; site abdomen:5010}. {BlankMultiple:19196::"Without guarding","With guarding","Without rebound","With rebound"}, No organomegaly appreciated. Rectal: {acrectalexam:27461} Extremities:  {With/Without:304960234} edema. Msk: Symmetrical without gross deformities. Peripheral pulses intact.  Neurologic:  Alert and  oriented x4;  No focal deficits.  Skin:   Dry and intact without significant lesions or rashes. Psychiatric:  Cooperative. Normal mood and affect.    [name], PA-C 11:11 AM

## 2022-08-02 ENCOUNTER — Ambulatory Visit: Payer: Medicare Other | Admitting: Physician Assistant

## 2022-08-03 ENCOUNTER — Encounter: Payer: Self-pay | Admitting: Physician Assistant

## 2022-08-08 LAB — LIPID PANEL
Cholesterol: 102 (ref 0–200)
HDL: 31 — AB (ref 35–70)
LDL Cholesterol: 46
Triglycerides: 147 (ref 40–160)

## 2022-08-08 LAB — COMPREHENSIVE METABOLIC PANEL
Albumin: 4.2 (ref 3.5–5.0)
Calcium: 9 (ref 8.7–10.7)
Globulin: 2.4

## 2022-08-08 LAB — HEPATIC FUNCTION PANEL
ALT: 10 U/L (ref 10–40)
AST: 14 (ref 14–40)
Alkaline Phosphatase: 87 (ref 25–125)
Bilirubin, Total: 0.3

## 2022-08-08 LAB — BASIC METABOLIC PANEL
BUN: 18 (ref 4–21)
CO2: 25 — AB (ref 13–22)
Chloride: 102 (ref 99–108)
Creatinine: 1.4 — AB (ref 0.6–1.3)
Glucose: 98
Potassium: 4.4 mEq/L (ref 3.5–5.1)
Sodium: 143 (ref 137–147)

## 2022-08-08 LAB — HEMOGLOBIN A1C: Hemoglobin A1C: 7.1

## 2022-08-08 LAB — PSA: PSA: 1

## 2022-08-17 ENCOUNTER — Other Ambulatory Visit: Payer: Self-pay | Admitting: "Endocrinology

## 2022-08-17 DIAGNOSIS — E2740 Unspecified adrenocortical insufficiency: Secondary | ICD-10-CM

## 2022-09-04 NOTE — Progress Notes (Unsigned)
09/05/2022 JOSHWA HEMRIC [id number] 08-Mar-1956  Referring provider: Valentino Nose, FNP Primary GI doctor: Dr. Silverio Decamp  ASSESSMENT AND PLAN:   Screen for colon cancer We have discussed the risks of bleeding, infection, perforation, medication reactions, and remote risk of death associated with colonoscopy. All questions were answered and the patient acknowledges these risk and wishes to proceed. Patient states he wants sutabs or he will not do the colonoscopy, will do sutabs, understands may not be covered by insurance.  Does not sound constipated but will add on miralax daily before colon as well.   Acute deep vein thrombosis (DVT) of distal vein of right lower extremity ([place]) Patient told to hold his Eliquis for 2 days prior to time of procedure. Will instruct when and how to resume after procedure. We will communicate with his prescribing physician Valentino Nose PCP to ensure that holding his Eliquis is acceptable. We discussed the risk, benefits and alternatives to colonoscopy/endoscopy.  We also discussed the low but real risk of cardiovascular event such as heart attack, stroke, embolism, thrombosis or ischemia/infarct of other organs off Eliquis and explained the need to seek urgent help if this occurs.  He is agreeable and wishes to proceed.  Adrenal insufficiency (HCC)  Type 2 diabetes mellitus with diabetic polyneuropathy, with long-term current use of insulin ([place]) Patient should be instructed to hold this medications if dose falls within 2 days of endoscopic procedure, due to increased risk of retained gastric contents.   Patient Care Team: Valentino Nose, FNP as PCP - General (Family Medicine)  HISTORY OF PRESENT ILLNESS: 66 y.o. male with a past medical history of DM 2, adrenal insuff, OSA, HTN, GERD and others listed below presents for evaluation of colonoscopy.   Patient on eliquis 5 mg BID for unprovoked DVT 2021, get prescription from Valentino Nose PCP. On  cortef 10 mg daily for adrenal insuff.  Patient on trulicity and oxycodone.  Patient had colonoscopy when he was 50, somewhere in [place].  Was suppose to do colonoscopy at [facility] but state it was suppose to be liquid so he did not want to do this.  Wants to do pill form only.   Patient denies GERD.  Patient denies dysphagia, nausea, vomiting, melena.  Has 3 BM's a week. Occ straining, no AB pain between BM's.  No family history of colon cancer.  Patient denies change in bowel habits, constipation, diarrhea, hematochezia.  Denies changes in appetite, unintentional weight loss.   Retired Therapist, art, works [place], and has 6 boys total.  He  reports that he has quit smoking. His smoking use included cigarettes. He has a 11.50 pack-year smoking history. He has never used smokeless tobacco. He reports current alcohol use. He reports that he does not use drugs.  Current Medications:   Current Outpatient Medications (Endocrine & Metabolic):    hydrocortisone (CORTEF) 10 MG tablet, TAKE 1 TABLET (10 MG TOTAL) BY MOUTH DAILY WITH BREAKFAST.   insulin glargine (LANTUS) 100 UNIT/ML Solostar Pen, Inject 60 Units into the skin at bedtime.   MOUNJARO 5 MG/0.5ML Pen, Inject into the skin.  Current Outpatient Medications (Cardiovascular):    furosemide (LASIX) 40 MG tablet, Take 40 mg by mouth daily.   rosuvastatin (CRESTOR) 10 MG tablet, Take 10 mg by mouth daily.   Current Outpatient Medications (Analgesics):    Oxycodone HCl 10 MG TABS, Take 10 mg by mouth 4 (four) times daily.   SUMAtriptan (IMITREX) 100 MG tablet, Take 100 mg by mouth as  directed.  Current Outpatient Medications (Hematological):    apixaban (ELIQUIS) 5 MG TABS tablet, Take 1 tablet (5 mg total) by mouth 2 (two) times daily. Please start around August 31, 2020 after you complete the initial starter pack  Current Outpatient Medications (Other):    alfuzosin (UROXATRAL) 10 MG 24 hr tablet, Take 10 mg by mouth daily.    ALPRAZolam (XANAX) 0.5 MG tablet, Take by mouth.   Cholecalciferol (VITAMIN D3) 5000 units CAPS, Take 2 capsules by mouth daily.    gabapentin (NEURONTIN) 300 MG capsule, Take 300 mg by mouth 3 (three) times daily.   Multiple Vitamins-Minerals (MULTIVITAMIN PO), Take 1 tablet by mouth daily.   pantoprazole (PROTONIX) 40 MG tablet, Take 40 mg by mouth daily.   potassium chloride (KLOR-CON) 10 MEQ tablet, Take 10 mEq by mouth daily.  Medical History:  Past Medical History:  Diagnosis Date   AKI (acute kidney injury) ([place]) 07/29/2020   Arthritis    Diabetes (HCC)    GERD (gastroesophageal reflux disease)    History of kidney stones    Hypercholesteremia    Hypertension    Migraine    Sleep apnea    Allergies:  Allergies  Allergen Reactions   Prednisone      Surgical History:  He  has a past surgical history that includes Shoulder surgery (Bilateral); Knee surgery (Right); Appendectomy; Tonsillectomy; Adenoidectomy; Incision and drainage perirectal abscess (N/A, 04/20/2013); and Central venous catheter insertion (Left, 04/20/2013). Family History:  His family history includes Cancer in his mother; Dementia in his father.  REVIEW OF SYSTEMS  : All other systems reviewed and negative except where noted in the History of Present Illness.  PHYSICAL EXAM: BP 112/68   Pulse 92   Ht '5\' 5"'$  (1.651 m)   Wt 225 lb 6 oz (102.2 kg)   SpO2 95%   BMI 37.50 kg/m  General:   Pleasant, well developed male in no acute distress Head:   Normocephalic and atraumatic. Eyes:  sclerae anicteric,conjunctive pink  Heart:   regular rate and rhythm Pulm:  Clear anteriorly; no wheezing Abdomen:   Soft, Obese AB, Active bowel sounds. No tenderness . Without guarding and Without rebound, No organomegaly appreciated. Rectal: Not evaluated Extremities:  Without edema. Msk: Symmetrical without gross deformities. Peripheral pulses intact.  Neurologic:  Alert and  oriented x4;  No focal deficits.  Skin:   Dry  and intact without significant lesions or rashes. Psychiatric:  Cooperative. Normal mood and affect.    [name], PA-C 3:11 PM

## 2022-09-05 ENCOUNTER — Encounter: Payer: Self-pay | Admitting: Physician Assistant

## 2022-09-05 ENCOUNTER — Ambulatory Visit (INDEPENDENT_AMBULATORY_CARE_PROVIDER_SITE_OTHER): Payer: Medicare Other | Admitting: Physician Assistant

## 2022-09-05 ENCOUNTER — Telehealth: Payer: Self-pay

## 2022-09-05 VITALS — BP 112/68 | HR 92 | Ht 65.0 in | Wt 225.4 lb

## 2022-09-05 DIAGNOSIS — I824Z1 Acute embolism and thrombosis of unspecified deep veins of right distal lower extremity: Secondary | ICD-10-CM

## 2022-09-05 DIAGNOSIS — E1142 Type 2 diabetes mellitus with diabetic polyneuropathy: Secondary | ICD-10-CM

## 2022-09-05 DIAGNOSIS — Z1211 Encounter for screening for malignant neoplasm of colon: Secondary | ICD-10-CM

## 2022-09-05 DIAGNOSIS — E2740 Unspecified adrenocortical insufficiency: Secondary | ICD-10-CM

## 2022-09-05 DIAGNOSIS — Z794 Long term (current) use of insulin: Secondary | ICD-10-CM

## 2022-09-05 MED ORDER — SUTAB 1479-225-188 MG PO TABS: ORAL_TABLET | ORAL | 0 refills | Status: DC

## 2022-09-05 NOTE — Telephone Encounter (Signed)
[facility] Pre-operative Risk Assessment     Request for surgical clearance:     Endoscopy Procedure  What type of surgery is being performed?     Colonoscopy  When is this surgery scheduled?     09/20/22  What type of clearance is required ?   Pharmacy  Are there any medications that need to be held prior to surgery and how long? Eliquis x 2 days  Practice name and name of physician performing surgery?      [facility]  What is your office phone and fax number?      Phone- [phone]  [phone]  Anesthesia type (None, local, MAC, general) ?       MAC

## 2022-09-05 NOTE — Patient Instructions (Addendum)
You have been scheduled for a colonoscopy. Please follow written instructions given to you at your visit today.  Please pick up your prep supplies at the pharmacy within the next 1-3 days. If you use inhalers (even only as needed), please bring them with you on the day of your procedure.   You will be contacted by our office prior to your procedure for directions on holding your Eliquis.  If you do not hear from our office 1 week prior to your scheduled procedure, please call [phone] to discuss.    Due to recent changes in healthcare laws, you may see the results of your imaging and laboratory studies on MyChart before your provider has had a chance to review them.  We understand that in some cases there may be results that are confusing or concerning to you. Not all laboratory results come back in the same time frame and the provider may be waiting for multiple results in order to interpret others.  Please give [place] 48 hours in order for your provider to thoroughly review all the results before contacting the office for clarification of your results.    Miralax is an osmotic laxative.  It only brings more water into the stool.  This is safe to take daily.  Can take up to 17 gram of miralax twice a day.  Mix with juice or coffee.  Start 1 capful at night for 3-4 days and reassess your response in 3-4 days.  You can increase and decrease the dose based on your response.  Remember, it can take up to 3-4 days to take effect OR for the effects to wear off.   I often pair this with benefiber in the morning to help assure the stool is not too loose.

## 2022-09-06 NOTE — Telephone Encounter (Signed)
Hi Denyzha!.I am covering [facility] preop box today. This patient has never seen [facility] before and we do not prescribe or manage their Eliquis. It looks like this was started for DVT in the hospital in the past, not on this for cardiac reasons. Please reach out to the person who prescribes this patient's blood thinner for clearance to hold.  If the patient otherwise needs to establish with [facility] for additional cardiac concerns, please reply back to P CV DIV PREOP CALLBACK and we're happy to get them scheduled (though clearance to hold blood thinner would still be at discretion of person managing h/o DVT). Thanks!

## 2022-09-08 ENCOUNTER — Telehealth: Payer: Self-pay

## 2022-09-11 ENCOUNTER — Telehealth: Payer: Self-pay

## 2022-09-11 NOTE — Telephone Encounter (Signed)
ARMISTEAD SULT [birth year] [id number]  Dear Shawn Fleming:  We have scheduled the above named patient for a(n) colonoscopy procedure. Our records show that (s)he is on anticoagulation therapy.  Please advise as to whether the patient may come off their therapy of Eliquis 2 days prior to their procedure which is scheduled for 09/20/22.  Please route your response to Tiffney Haughton or fax response to [phone].  Sincerely,    [facility]

## 2022-09-15 ENCOUNTER — Telehealth: Payer: Self-pay | Admitting: Physician Assistant

## 2022-09-15 NOTE — Telephone Encounter (Signed)
Inbound call from patient inquiring about when he should stop taking his blood thinner for upcoming procedure.

## 2022-09-18 ENCOUNTER — Other Ambulatory Visit: Payer: Self-pay

## 2022-09-20 ENCOUNTER — Encounter: Payer: TRICARE For Life (TFL) | Admitting: Gastroenterology

## 2022-09-21 ENCOUNTER — Ambulatory Visit: Payer: Medicare Other | Admitting: "Endocrinology

## 2022-09-25 NOTE — Telephone Encounter (Signed)
error 

## 2022-09-25 NOTE — Telephone Encounter (Signed)
Faxed clearance request to Mare Ferrari, NP.

## 2022-09-25 NOTE — Telephone Encounter (Signed)
Error

## 2022-10-07 ENCOUNTER — Encounter: Payer: Self-pay | Admitting: Certified Registered Nurse Anesthetist

## 2022-10-11 ENCOUNTER — Encounter: Payer: Self-pay | Admitting: Gastroenterology

## 2022-10-11 ENCOUNTER — Ambulatory Visit (AMBULATORY_SURGERY_CENTER): Payer: Medicare Other | Admitting: Gastroenterology

## 2022-10-11 VITALS — BP 115/64 | HR 72 | Temp 96.6°F | Resp 17 | Ht 65.0 in | Wt 225.0 lb

## 2022-10-11 DIAGNOSIS — Z1211 Encounter for screening for malignant neoplasm of colon: Secondary | ICD-10-CM | POA: Diagnosis not present

## 2022-10-11 DIAGNOSIS — D122 Benign neoplasm of ascending colon: Secondary | ICD-10-CM

## 2022-10-11 MED ORDER — SODIUM CHLORIDE 0.9 % IV SOLN: 500.0000 mL | INTRAVENOUS | Status: DC

## 2022-10-11 NOTE — Patient Instructions (Signed)
YOU HAD AN ENDOSCOPIC PROCEDURE TODAY AT THE [facility]:   Refer to the procedure report that was given to you for any specific questions about what was found during the examination.  If the procedure report does not answer your questions, please call your gastroenterologist to clarify.  If you requested that your care partner not be given the details of your procedure findings, then the procedure report has been included in a sealed envelope for you to review at your convenience later.  **Handouts given on polyps, diverticulosis and hemorrhoids**  YOU SHOULD EXPECT: Some feelings of bloating in the abdomen. Passage of more gas than usual.  Walking can help get rid of the air that was put into your GI tract during the procedure and reduce the bloating. If you had a lower endoscopy (such as a colonoscopy or flexible sigmoidoscopy) you may notice spotting of blood in your stool or on the toilet paper. If you underwent a bowel prep for your procedure, you may not have a normal bowel movement for a few days.  Please Note:  You might notice some irritation and congestion in your nose or some drainage.  This is from the oxygen used during your procedure.  There is no need for concern and it should clear up in a day or so.  SYMPTOMS TO REPORT IMMEDIATELY:  Following lower endoscopy (colonoscopy or flexible sigmoidoscopy):  Excessive amounts of blood in the stool  Significant tenderness or worsening of abdominal pains  Swelling of the abdomen that is new, acute  Fever of 100F or higher  For urgent or emergent issues, a gastroenterologist can be reached at any hour by calling [phone]. Do not use MyChart messaging for urgent concerns.    DIET:  We do recommend a small meal at first, but then you may proceed to your regular diet.  Drink plenty of fluids but you should avoid alcoholic beverages for 24 hours.  ACTIVITY:  You should plan to take it easy for the rest of today and you  should NOT DRIVE or use heavy machinery until tomorrow (because of the sedation medicines used during the test).    FOLLOW UP: Our staff will call the number listed on your records the next business day following your procedure.  We will call around 7:15- 8:00 am to check on you and address any questions or concerns that you may have regarding the information given to you following your procedure. If we do not reach you, we will leave a message.     If any biopsies were taken you will be contacted by phone or by letter within the next 1-3 weeks.  Please call us at [phone] if you have not heard about the biopsies in 3 weeks.    SIGNATURES/CONFIDENTIALITY: You and/or your care partner have signed paperwork which will be entered into your electronic medical record.  These signatures attest to the fact that that the information above on your After Visit Summary has been reviewed and is understood.  Full responsibility of the confidentiality of this discharge information lies with you and/or your care-partner. 

## 2022-10-11 NOTE — Op Note (Signed)
[facility] Patient Name: Shawn Fleming Procedure Date: 10/11/2022 3:33 PM MRN: [id number] Endoscopist: Mauri Pole , MD, [id number] Age: 66 Referring MD:  Date of Birth: 08-12-1956 Gender: Male Account #: [id number] Procedure:                Colonoscopy Indications:              Screening for colorectal malignant neoplasm Medicines:                Monitored Anesthesia Care Procedure:                Pre-Anesthesia Assessment:                           - Prior to the procedure, a History and Physical                            was performed, and patient medications and                            allergies were reviewed. The patient's tolerance of                            previous anesthesia was also reviewed. The risks                            and benefits of the procedure and the sedation                            options and risks were discussed with the patient.                            All questions were answered, and informed consent                            was obtained. Prior Anticoagulants: The patient                            last took Eliquis (apixaban) 2 days prior to the                            procedure. ASA Grade Assessment: II - A patient                            with mild systemic disease. After reviewing the                            risks and benefits, the patient was deemed in                            satisfactory condition to undergo the procedure.                           After obtaining informed consent, the colonoscope  was passed under direct vision. Throughout the                            procedure, the patient's blood pressure, pulse, and                            oxygen saturations were monitored continuously. The                            Olympus PCF-H190DL [id number]) Colonoscope was                            introduced through the anus and advanced to the the                             cecum, identified by its appearance. The                            colonoscopy was performed without difficulty. The                            patient tolerated the procedure well. The quality                            of the bowel preparation was adequate to identify                            polyps greater than 5 mm in size. The ileocecal                            valve, appendiceal orifice, and rectum were                            photographed. Scope In: 3:41:28 PM Scope Out: 3:56:48 PM Scope Withdrawal Time: 0 hours 10 minutes 42 seconds  Total Procedure Duration: 0 hours 15 minutes 20 seconds  Findings:                 The perianal and digital rectal examinations were                            normal.                           Three sessile polyps were found in the ascending                            colon. The polyps were 3 to 8 mm in size. These                            polyps were removed with a cold snare. Resection                            and retrieval were complete.  A few small-mouthed diverticula were found in the                            sigmoid colon and ascending colon.                           Non-bleeding external and internal hemorrhoids were                            found during retroflexion. The hemorrhoids were                            small. Complications:            No immediate complications. Estimated Blood Loss:     Estimated blood loss was minimal. Impression:               - Three 3 to 8 mm polyps in the ascending colon,                            removed with a cold snare. Resected and retrieved.                           - Diverticulosis in the sigmoid colon and in the                            ascending colon.                           - Non-bleeding external and internal hemorrhoids. Recommendation:           - Patient has a contact number available for                            emergencies. The signs and  symptoms of potential                            delayed complications were discussed with the                            patient. Return to normal activities tomorrow.                            Written discharge instructions were provided to the                            patient.                           - Resume previous diet.                           - Continue present medications.                           - Await pathology results.                           -  Repeat colonoscopy in 3 years for surveillance                            based on pathology results.                           - Resume Eliquis (apixaban) at prior dose tomorrow.                            Refer to managing physician for further adjustment                            of therapy. Mauri Pole, MD 10/11/2022 4:01:14 PM This report has been signed electronically.

## 2022-10-11 NOTE — Progress Notes (Signed)
[facility] History and Physical   Primary Care Physician:  Valentino Nose, FNP   Reason for Procedure:  Colorectal cancer screening  Plan:    Screening colonoscopy with possible interventions as needed     HPI: Shawn Fleming is a very pleasant 66 y.o. male here for screening colonoscopy. Denies any nausea, vomiting, abdominal pain, melena or bright red blood per rectum  The risks and benefits as well as alternatives of endoscopic procedure(s) have been discussed and reviewed. All questions answered. The patient agrees to proceed.    Past Medical History:  Diagnosis Date   AKI (acute kidney injury) ([place]) 07/29/2020   Arthritis    Diabetes ([place])    GERD (gastroesophageal reflux disease)    History of kidney stones    Hypercholesteremia    Hypertension    Migraine    Sleep apnea     Past Surgical History:  Procedure Laterality Date   ADENOIDECTOMY     APPENDECTOMY     CENTRAL VENOUS CATHETER INSERTION Left 04/20/2013   Procedure: INSERTION CENTRAL LINE LEFT SUBCLAVIAN;  Surgeon: Donato Heinz, MD;  Location: [facility];  Service: General;  Laterality: Left;   INCISION AND DRAINAGE PERIRECTAL ABSCESS N/A 04/20/2013   Procedure: SHARP SURGICAL DEBRIDEMENT PERINEAL INFECTION;  Surgeon: Donato Heinz, MD;  Location: [facility];  Service: General;  Laterality: N/A;   KNEE SURGERY Right    arthroscopic   SHOULDER SURGERY Bilateral    removal of bone.   TONSILLECTOMY      Prior to Admission medications   Medication Sig Start Date End Date Taking? Authorizing Provider  alfuzosin (UROXATRAL) 10 MG 24 hr tablet Take 10 mg by mouth daily. 08/24/20  Yes [provider]  ALPRAZolam Duanne Moron) 0.5 MG tablet Take by mouth.   Yes [provider]  Cholecalciferol (VITAMIN D3) 5000 units CAPS Take 2 capsules by mouth daily.    Yes [provider]  furosemide (LASIX) 40 MG tablet Take 40 mg by mouth daily. 07/27/22  Yes [provider]  gabapentin  (NEURONTIN) 300 MG capsule Take 300 mg by mouth 3 (three) times daily. 06/15/20  Yes [provider]  hydrocortisone (CORTEF) 10 MG tablet TAKE 1 TABLET (10 MG TOTAL) BY MOUTH DAILY WITH BREAKFAST. 08/17/22  Yes Nida, Marella Chimes, MD  insulin glargine (LANTUS) 100 UNIT/ML Solostar Pen Inject 60 Units into the skin at bedtime.   Yes [provider]  Multiple Vitamins-Minerals (MULTIVITAMIN PO) Take 1 tablet by mouth daily.   Yes [provider]  Oxycodone HCl 10 MG TABS Take 10 mg by mouth 4 (four) times daily. 07/05/20  Yes [provider]  pantoprazole (PROTONIX) 40 MG tablet Take 40 mg by mouth daily. 03/21/20  Yes [provider]  potassium chloride (KLOR-CON) 10 MEQ tablet Take 10 mEq by mouth daily. 07/11/22  Yes [provider]  rosuvastatin (CRESTOR) 10 MG tablet Take 10 mg by mouth daily.   Yes [provider]  apixaban (ELIQUIS) 5 MG TABS tablet Take 1 tablet (5 mg total) by mouth 2 (two) times daily. Please start around August 31, 2020 after you complete the initial starter pack 08/31/20   Roxan Hockey, MD  [facility] 5 MG/0.5ML Pen Inject into the skin. 08/15/22   [provider]  SUMAtriptan (IMITREX) 100 MG tablet Take 100 mg by mouth as directed. 07/03/22   [provider]    Current Outpatient Medications  Medication Sig Dispense Refill   alfuzosin (UROXATRAL) 10 MG  24 hr tablet Take 10 mg by mouth daily.     ALPRAZolam (XANAX) 0.5 MG tablet Take by mouth.     Cholecalciferol (VITAMIN D3) 5000 units CAPS Take 2 capsules by mouth daily.      furosemide (LASIX) 40 MG tablet Take 40 mg by mouth daily.     gabapentin (NEURONTIN) 300 MG capsule Take 300 mg by mouth 3 (three) times daily.     hydrocortisone (CORTEF) 10 MG tablet TAKE 1 TABLET (10 MG TOTAL) BY MOUTH DAILY WITH BREAKFAST. 90 tablet 0   insulin glargine (LANTUS) 100 UNIT/ML Solostar Pen Inject 60 Units into the skin at bedtime.     Multiple  Vitamins-Minerals (MULTIVITAMIN PO) Take 1 tablet by mouth daily.     Oxycodone HCl 10 MG TABS Take 10 mg by mouth 4 (four) times daily.     pantoprazole (PROTONIX) 40 MG tablet Take 40 mg by mouth daily.     potassium chloride (KLOR-CON) 10 MEQ tablet Take 10 mEq by mouth daily.     rosuvastatin (CRESTOR) 10 MG tablet Take 10 mg by mouth daily.     apixaban (ELIQUIS) 5 MG TABS tablet Take 1 tablet (5 mg total) by mouth 2 (two) times daily. Please start around August 31, 2020 after you complete the initial starter pack 60 tablet 3   MOUNJARO 5 MG/0.5ML Pen Inject into the skin.     SUMAtriptan (IMITREX) 100 MG tablet Take 100 mg by mouth as directed.     Current Facility-Administered Medications  Medication Dose Route Frequency Provider Last Rate Last Admin   0.9 %  sodium chloride infusion  500 mL Intravenous Continuous Katlynn Naser, Venia Minks, MD        Allergies as of 10/11/2022 - Review Complete 10/11/2022  Allergen Reaction Noted   Prednisone  09/09/2020    Family History  Problem Relation Age of Onset   Cancer Mother    Dementia Father    Colon cancer Neg Hx    Rectal cancer Neg Hx    Stomach cancer Neg Hx    Esophageal cancer Neg Hx     Social History   Socioeconomic History   Marital status: Divorced    Spouse name: Not on file   Number of children: 6   Years of education: Not on file   Highest education level: Not on file  Occupational History   Occupation: Animal nutritionist  Tobacco Use   Smoking status: Former    Packs/day: 0.50    Years: 23.00    Total pack years: 11.50    Types: Cigarettes   Smokeless tobacco: Never  Vaping Use   Vaping Use: Former  Substance and Sexual Activity   Alcohol use: Yes    Comment: rare   Drug use: No   Sexual activity: Not Currently    Birth control/protection: None  Other Topics Concern   Not on file  Social History Narrative   Not on file   Social Determinants of Health   Financial Resource Strain: Not on file   Food Insecurity: Not on file  Transportation Needs: Not on file  Physical Activity: Not on file  Stress: Not on file  Social Connections: Not on file  Intimate Partner Violence: Not on file    Review of Systems:  All other review of systems negative except as mentioned in the HPI.  Physical Exam: Vital signs in last 24 hours: Blood Pressure 127/73   Pulse 81   Temperature (Abnormal) 96.6 F (35.9 C)  Respiration 12   Height '5\' 5"'$  (1.651 m)   Weight 225 lb (102.1 kg)   Oxygen Saturation 95%   Body Mass Index 37.44 kg/m  General:   Alert, NAD Lungs:  Clear .   Heart:  Regular rate and rhythm Abdomen:  Soft, nontender and nondistended. Neuro/Psych:  Alert and cooperative. Normal mood and affect. A and O x 3  Reviewed labs, radiology imaging, old records and pertinent past GI work up  Patient is appropriate for planned procedure(s) and anesthesia in an ambulatory setting   K. Denzil Magnuson , MD [phone]

## 2022-10-11 NOTE — Progress Notes (Signed)
Report given to PACU, vss 

## 2022-10-11 NOTE — Progress Notes (Signed)
Called to room to assist during endoscopic procedure.  Patient ID and intended procedure confirmed with present staff. Received instructions for my participation in the procedure from the performing physician.  

## 2022-10-12 ENCOUNTER — Telehealth: Payer: Self-pay

## 2022-10-12 NOTE — Telephone Encounter (Signed)
  Follow up Call-     10/11/2022    3:10 PM  Call back number  Post procedure Call Back phone  # [phone]  Permission to leave phone message Yes     Patient questions:  Do you have a fever, pain , or abdominal swelling? No. Pain Score  0 *  Have you tolerated food without any problems? Yes.    Have you been able to return to your normal activities? Yes.    Do you have any questions about your discharge instructions: Diet   No. Medications  No. Follow up visit  No.  Do you have questions or concerns about your Care? No.  Actions: * If pain score is 4 or above: No action needed, pain <4.

## 2022-10-22 ENCOUNTER — Encounter: Payer: Self-pay | Admitting: Gastroenterology

## 2022-10-25 LAB — COMPREHENSIVE METABOLIC PANEL
ALT: 14 IU/L (ref 0–44)
AST: 15 IU/L (ref 0–40)
Albumin/Globulin Ratio: 1.7 (ref 1.2–2.2)
Albumin: 4.4 g/dL (ref 3.9–4.9)
Alkaline Phosphatase: 83 IU/L (ref 44–121)
BUN/Creatinine Ratio: 12 (ref 10–24)
BUN: 19 mg/dL (ref 8–27)
Bilirubin Total: 0.3 mg/dL (ref 0.0–1.2)
CO2: 30 mmol/L — ABNORMAL HIGH (ref 20–29)
Calcium: 9.8 mg/dL (ref 8.6–10.2)
Chloride: 101 mmol/L (ref 96–106)
Creatinine, Ser: 1.59 mg/dL — ABNORMAL HIGH (ref 0.76–1.27)
Globulin, Total: 2.6 g/dL (ref 1.5–4.5)
Glucose: 86 mg/dL (ref 70–99)
Potassium: 4.6 mmol/L (ref 3.5–5.2)
Sodium: 144 mmol/L (ref 134–144)
Total Protein: 7 g/dL (ref 6.0–8.5)
eGFR: 48 mL/min/{1.73_m2} — ABNORMAL LOW (ref >59)

## 2022-10-31 ENCOUNTER — Ambulatory Visit: Payer: Medicare Other | Admitting: "Endocrinology

## 2022-11-07 ENCOUNTER — Other Ambulatory Visit (HOSPITAL_COMMUNITY): Payer: Self-pay | Admitting: Family Medicine

## 2022-11-07 DIAGNOSIS — M25551 Pain in right hip: Secondary | ICD-10-CM

## 2022-11-07 DIAGNOSIS — G629 Polyneuropathy, unspecified: Secondary | ICD-10-CM | POA: Insufficient documentation

## 2022-11-14 ENCOUNTER — Ambulatory Visit (HOSPITAL_COMMUNITY)
Admission: RE | Admit: 2022-11-14 | Discharge: 2022-11-14 | Disposition: A | Discharge status: Home / Self Care | Payer: Medicare Other | Source: Ambulatory Visit | Attending: Family Medicine | Admitting: Family Medicine

## 2022-11-14 DIAGNOSIS — M25551 Pain in right hip: Secondary | ICD-10-CM | POA: Diagnosis present

## 2022-11-23 ENCOUNTER — Telehealth: Payer: Self-pay | Admitting: Orthopedic Surgery

## 2022-11-23 NOTE — Telephone Encounter (Signed)
Called and spoke w/the patient.  He is being referred for right hip pain.  The patient stated that it is radiating to the front of this right thigh.  He has not seen by another specialist, orthopedist, no surgery for this problem, xray was done at [facility] recently and he has not been to the emergency room for this.  Advised I will call back to schedule once I have my supervisor review.  Please advise.

## 2022-12-05 DIAGNOSIS — M47816 Spondylosis without myelopathy or radiculopathy, lumbar region: Secondary | ICD-10-CM | POA: Insufficient documentation

## 2022-12-05 DIAGNOSIS — M48061 Spinal stenosis, lumbar region without neurogenic claudication: Secondary | ICD-10-CM | POA: Insufficient documentation

## 2022-12-05 DIAGNOSIS — E109 Type 1 diabetes mellitus without complications: Secondary | ICD-10-CM | POA: Insufficient documentation

## 2022-12-05 DIAGNOSIS — G8929 Other chronic pain: Secondary | ICD-10-CM | POA: Insufficient documentation

## 2022-12-07 ENCOUNTER — Ambulatory Visit (INDEPENDENT_AMBULATORY_CARE_PROVIDER_SITE_OTHER): Payer: Medicare Other | Admitting: "Endocrinology

## 2022-12-07 VITALS — BP 100/72 | HR 96 | Ht 65.0 in | Wt 229.6 lb

## 2022-12-07 DIAGNOSIS — E2740 Unspecified adrenocortical insufficiency: Secondary | ICD-10-CM

## 2022-12-07 MED ORDER — HYDROCORTISONE 10 MG PO TABS: 10.0000 mg | ORAL_TABLET | Freq: Every day | ORAL | 1 refills | Status: DC

## 2022-12-07 NOTE — Progress Notes (Signed)
12/07/2022, 4:30 PM  Endocrinology follow-up note   Subjective:    Patient ID: Shawn Fleming, male    DOB: 1956/01/07.  Shawn Fleming is being seen in follow-up for adrenal insufficiency.  He was also following up for type 2 DM, but he would like to address his diabetes with his PCP.  PCP:   Valentino Nose, FNP.   Past Medical History:  Diagnosis Date   AKI (acute kidney injury) ([place]) 07/29/2020   Arthritis    Diabetes ([place])    GERD (gastroesophageal reflux disease)    History of kidney stones    Hypercholesteremia    Hypertension    Migraine    Sleep apnea     Past Surgical History:  Procedure Laterality Date   ADENOIDECTOMY     APPENDECTOMY     CENTRAL VENOUS CATHETER INSERTION Left 04/20/2013   Procedure: INSERTION CENTRAL LINE LEFT SUBCLAVIAN;  Surgeon: Donato Heinz, MD;  Location: [facility];  Service: General;  Laterality: Left;   INCISION AND DRAINAGE PERIRECTAL ABSCESS N/A 04/20/2013   Procedure: SHARP SURGICAL DEBRIDEMENT PERINEAL INFECTION;  Surgeon: Donato Heinz, MD;  Location: [facility];  Service: General;  Laterality: N/A;   KNEE SURGERY Right    arthroscopic   SHOULDER SURGERY Bilateral    removal of bone.   TONSILLECTOMY      Social History   Socioeconomic History   Marital status: Divorced    Spouse name: Not on file   Number of children: 6   Years of education: Not on file   Highest education level: Not on file  Occupational History   Occupation: Animal nutritionist  Tobacco Use   Smoking status: Former    Packs/day: 0.50    Years: 23.00    Total pack years: 11.50    Types: Cigarettes   Smokeless tobacco: Never  Vaping Use   Vaping Use: Former  Substance and Sexual Activity   Alcohol use: Yes    Comment: rare   Drug use: No   Sexual activity: Not Currently    Birth control/protection: None  Other Topics Concern   Not on file  Social History Narrative   Not on file    Social Determinants of Health   Financial Resource Strain: Not on file  Food Insecurity: Not on file  Transportation Needs: Not on file  Physical Activity: Not on file  Stress: Not on file  Social Connections: Not on file    Family History  Problem Relation Age of Onset   Cancer Mother    Dementia Father    Colon cancer Neg Hx    Rectal cancer Neg Hx    Stomach cancer Neg Hx    Esophageal cancer Neg Hx     Outpatient Encounter Medications as of 12/07/2022  Medication Sig   alfuzosin (UROXATRAL) 10 MG 24 hr tablet Take 10 mg by mouth daily.   ALPRAZolam (XANAX) 0.5 MG tablet Take by mouth.   apixaban (ELIQUIS) 5 MG TABS tablet Take 1 tablet (5 mg total) by mouth 2 (two) times daily. Please start around August 31, 2020 after you complete the initial starter pack   Cholecalciferol (VITAMIN D3) 5000 units CAPS  Take 2 capsules by mouth daily.    furosemide (LASIX) 40 MG tablet Take 40 mg by mouth daily.   gabapentin (NEURONTIN) 300 MG capsule Take 300 mg by mouth 3 (three) times daily.   hydrocortisone (CORTEF) 10 MG tablet Take 1 tablet (10 mg total) by mouth daily with breakfast.   insulin glargine (LANTUS) 100 UNIT/ML Solostar Pen Inject 60 Units into the skin at bedtime.   MOUNJARO 5 MG/0.5ML Pen Inject into the skin. Pt taking 7.'5mg'$  weekly   Multiple Vitamins-Minerals (MULTIVITAMIN PO) Take 1 tablet by mouth daily.   Oxycodone HCl 10 MG TABS Take 10 mg by mouth 4 (four) times daily.   pantoprazole (PROTONIX) 40 MG tablet Take 40 mg by mouth daily.   potassium chloride (KLOR-CON) 10 MEQ tablet Take 10 mEq by mouth daily.   rosuvastatin (CRESTOR) 10 MG tablet Take 10 mg by mouth daily.   SUMAtriptan (IMITREX) 100 MG tablet Take 100 mg by mouth as directed.   [DISCONTINUED] hydrocortisone (CORTEF) 10 MG tablet TAKE 1 TABLET (10 MG TOTAL) BY MOUTH DAILY WITH BREAKFAST.   No facility-administered encounter medications on file as of 12/07/2022.    ALLERGIES: Allergies   Allergen Reactions   Prednisone     VACCINATION STATUS: There is no immunization history for the selected administration types on file for this patient.  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 He remains on hydrocortisone 10 mg p.o. daily.  He does not have acute symptoms today.  His blood pressure is stable at 100/72.  No interval ER visits or hospitalizations.    He also has type 2 diabetes and hyperlipidemia following up with his PCP.  His most recent A1c was said to be 6.4%.  Hyperlipidemia This is a chronic problem. The current episode started more than 1 year ago. The problem is uncontrolled. Exacerbating diseases include chronic renal disease, diabetes and obesity. Pertinent negatives include no chest pain, myalgias or shortness of breath. Current antihyperlipidemic treatment includes statins. Risk factors for coronary artery disease include diabetes mellitus, dyslipidemia, family history, hypertension, male sex, obesity and a sedentary lifestyle.  Hypertension This is a chronic problem. The current episode started more than 1 year ago. Pertinent negatives include no chest pain, headaches, neck pain, palpitations or shortness of breath. Risk factors for coronary artery disease include diabetes mellitus, dyslipidemia, obesity, male gender, sedentary lifestyle, smoking/tobacco exposure and family history. Past treatments include ACE inhibitors. Hypertensive end-organ damage includes kidney disease. Identifiable causes of hypertension include chronic renal disease.      Review of Systems  Constitutional:  Negative for chills, fatigue, fever and unexpected weight change.  HENT:  Negative for dental problem, mouth sores and trouble swallowing.   Eyes:  Negative for visual disturbance.  Respiratory:  Negative for cough, choking, chest tightness, shortness of breath and wheezing.   Cardiovascular:  Negative for chest pain, palpitations and  leg swelling.  Gastrointestinal:  Negative for abdominal distention, abdominal pain, constipation, diarrhea, nausea and vomiting.  Endocrine: Negative for polydipsia, polyphagia and polyuria.  Genitourinary:  Negative for dysuria, flank pain, hematuria and urgency.  Musculoskeletal:  Negative for back pain, gait problem, myalgias and neck pain.  Skin:  Negative for pallor, rash and wound.  Neurological:  Positive for dizziness. Negative for seizures, syncope, weakness, numbness and headaches.  Psychiatric/Behavioral:  Negative for confusion and dysphoric mood.     Objective:       12/07/2022    1:54 PM 10/11/2022  4:20 PM 10/11/2022    4:10 PM  Vitals with BMI  Height '5\' 5"'$     Weight 229 lbs 10 oz    BMI 80.99    Systolic 833 825 053  Diastolic 72 64 67  Pulse 96 72 76    BP 100/72   Pulse 96   Ht '5\' 5"'$  (1.651 m)   Wt 229 lb 9.6 oz (104.1 kg)   BMI 38.21 kg/m   Wt Readings from Last 3 Encounters:  12/07/22 229 lb 9.6 oz (104.1 kg)  10/11/22 225 lb (102.1 kg)  09/05/22 225 lb 6 oz (102.2 kg)        CMP ( most recent) CMP     Component Value Date/Time   NA 144 10/24/2022 0819   K 4.6 10/24/2022 0819   CL 101 10/24/2022 0819   CO2 30 (H) 10/24/2022 0819   GLUCOSE 86 10/24/2022 0819   GLUCOSE 235 (H) 08/01/2020 0758   BUN 19 10/24/2022 0819   CREATININE 1.59 (H) 10/24/2022 0819   CALCIUM 9.8 10/24/2022 0819   PROT 7.0 10/24/2022 0819   ALBUMIN 4.4 10/24/2022 0819   AST 15 10/24/2022 0819   ALT 14 10/24/2022 0819   ALKPHOS 83 10/24/2022 0819   BILITOT 0.3 10/24/2022 0819   GFRNONAA 51 (L) 08/01/2020 0758   GFRAA 59 (L) 08/01/2020 0758     Diabetic Labs (most recent): Lab Results  Component Value Date   HGBA1C 7.1 08/08/2022   HGBA1C 10.7 (A) 12/22/2021   HGBA1C 9.1 (A) 08/10/2021     Lab Results  Component Value Date   TSH 3.490 12/16/2021   TSH 0.622 07/29/2020   TSH 2.456 04/30/2013   FREET4 1.36 12/16/2021       Assessment & Plan:    1) Adrenal insufficiency During the time of diagnosis, he was found to have profoundly low basal cortisol at 1.8.  ACTH stimulation test showed inadequate adrenal response with 12.2 at 30 minutes, 9.3 at 60 minutes.  Etiology of her insufficiency is not settled yet.  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   He is advised to continue on partial replacement with hydrocortisone, 10 mg p.o. daily at breakfast.  This patient will benefit from reassessment with ACTH stimulation test after switching to dexamethasone for few days.  This will be done when he is clinically more stable on subsequent visits.  2. controlled diabetes mellitus with stage 2 chronic kidney disease ([place])   - Shawn Fleming has currently controlled symptomatic type 2 DM since  66 years of age.  He did not bring any meter nor logs. Before he showed up and today at check in he said he would like to address his Dibetes with his PCP and he wants [place] to treat him only for hisadreanl insufficiency.  His most recent A1c was said to be 6.4%.  He is currently on Lantus 60 units nightly and Mounjaro 5 mg subcutaneously weekly.  He is advised to maintain close follow-up with his PCP.     3) Blood Pressure /Hypertension: His blood pressure is marginally controlled , he will not tolerate any antihypertensive medications including lisinopril for now.    4) Lipids/Hyperlipidemia: He does not have recent lipid panel to review.  He has lab appointment with his PMD.  We will request for a copy.  He is advised to continue Crestor 10 mg p.o. nightly.  Side effects and precautions discussed with him.  5)  Weight/Diet:  Body mass index is 38.21 kg/m.  -   clearly complicating his diabetes care.   he is  a candidate for weight loss. I discussed with him the fact that loss of 5 - 10% of his  current body weight will have the most impact on his diabetes management.  Exercise, and detailed carbohydrates  information provided  -  detailed on discharge instructions.   6) Chronic Care/Health Maintenance:  -he  is on and Statin medications and  is encouraged to initiate and continue to follow up with Ophthalmology, Dentist,  Podiatrist at least yearly or according to recommendations, and advised to   stay away from smoking. I have recommended yearly flu vaccine and pneumonia vaccine at least every 5 years; moderate intensity exercise for up to 150 minutes weekly; and  sleep for at least 7 hours a day.  His recent ABI was negative for PAD in April 2021.  This study will be repeated in April 2026 or sooner if needed.    - he is  advised to maintain close follow up with Valentino Nose, FNP for primary care needs, as well as his other providers for optimal and coordinated care.   I spent 21  minutes in the care of the patient today including review of labs from Thyroid Function, CMP, and other relevant labs ; imaging/biopsy records (current and previous including abstractions from other facilities); face-to-face time discussing  his lab results and symptoms, medications doses, his options of short and long term treatment based on the latest standards of care / guidelines;   and documenting the encounter.  Shawn Fleming  participated in the discussions, expressed understanding, and voiced agreement with the above plans.  All questions were answered to his satisfaction. he is encouraged to contact clinic should he have any questions or concerns prior to his return visit.   Follow up plan: - Return in about 6 months (around 06/08/2023) for F/U with Pre-visit Labs.  Glade Lloyd, MD [facility] [address] [place], [place] [address] Phone: [phone]  Fax: [phone]    12/07/2022, 4:30 PM  This note was partially dictated with voice recognition software. Similar sounding words can be transcribed inadequately or may not  be corrected upon  review.

## 2022-12-08 ENCOUNTER — Encounter: Payer: Self-pay | Admitting: Orthopedic Surgery

## 2022-12-08 ENCOUNTER — Ambulatory Visit (INDEPENDENT_AMBULATORY_CARE_PROVIDER_SITE_OTHER): Payer: Medicare Other | Admitting: Orthopedic Surgery

## 2022-12-08 VITALS — BP 110/88 | HR 108 | Ht 65.0 in | Wt 228.0 lb

## 2022-12-08 DIAGNOSIS — M48061 Spinal stenosis, lumbar region without neurogenic claudication: Secondary | ICD-10-CM | POA: Diagnosis not present

## 2022-12-08 DIAGNOSIS — M7071 Other bursitis of hip, right hip: Secondary | ICD-10-CM

## 2022-12-08 MED ORDER — METHYLPREDNISOLONE ACETATE 40 MG/ML IJ SUSP
40.0000 mg | Freq: Once | INTRAMUSCULAR | Status: AC
  Administered 2022-12-08: 40 mg via INTRA_ARTICULAR

## 2022-12-08 NOTE — Progress Notes (Signed)
Chief Complaint  Patient presents with   Hip Pain    Right/ has pain states the back is fine, he already has doctor for his back pain, Dr Brien Few     HPI: 66 year old male history of lumbar disc disease presents with right pain has had for several years he complains of pain over the right greater trochanter  He also complains of burning pain numbness down the lateral sides of both legs and all the way into the upper leg on the right side  He does not describe this is back pain but he also denies groin pain    Past Medical History:  Diagnosis Date   AKI (acute kidney injury) ([place]) 07/29/2020   Arthritis    Diabetes ([place])    GERD (gastroesophageal reflux disease)    History of kidney stones    Hypercholesteremia    Hypertension    Migraine    Sleep apnea     BP 110/88   Pulse (!) 108   Ht '5\' 5"'$  (1.651 m)   Wt 228 lb (103.4 kg)   BMI 37.94 kg/m    General appearance: Well-developed well-nourished no gross deformities  Cardiovascular normal pulse and perfusion normal color without edema  Neurologically no sensation loss or deficits or pathologic reflexes  Psychological: Awake alert and oriented x3 mood and affect normal  Skin no lacerations or ulcerations no nodularity no palpable masses, no erythema or nodularity  Musculoskeletal:  He is ambulatory no assistive devices does not appear to limp walks upright  Leg lengths are equal  Hip rotation is negative for reproduction of pain hip flexion is normal he does have limited internal rotation bilaterally  He has tenderness over both greater trochanters  He also has lumbar spine tenderness L4-L5 area at the belt line this tracks along the right lateral aspect of the lumbar spine as well.  Imaging outside imaging was performed [facility] pelvis [facility] lateral right hip  There is mild arthritis of the right hip.  There is mild femoral head neck offset decreased on the right side.  A/P  Encounter Diagnoses  Name Primary?    Other bursitis of hip, right hip Yes   Spinal stenosis of lumbar region without neurogenic claudication    Most of this pain is actually related to his lumbar disc disease  He does have some tenderness over both greater trochanters are right much worse than the left so we advised an injection which she agreed to  We can repeat this in 4 to 5 months if needed.  He should continue his gabapentin for the radicular symptoms in the neuropathy  Procedure note injection for right hip bursitis  Verbal consent was obtained for injection of the right hip  Timeout was completed to confirm the injection site  The medications used were 40 mg of Depo-Medrol and 1% lidocaine 3 cc  Anesthesia was provided by ethyl chloride and the skin was prepped with alcohol.  After cleaning the skin with alcohol a 25-gauge needle was used to inject the greater trochanteric bursa right hip   No complications were noted

## 2022-12-08 NOTE — Patient Instructions (Signed)

## 2023-01-17 ENCOUNTER — Other Ambulatory Visit: Payer: Self-pay | Admitting: "Endocrinology

## 2023-01-17 DIAGNOSIS — E2740 Unspecified adrenocortical insufficiency: Secondary | ICD-10-CM

## 2023-01-19 ENCOUNTER — Other Ambulatory Visit: Payer: Self-pay | Admitting: "Endocrinology

## 2023-01-19 DIAGNOSIS — E2740 Unspecified adrenocortical insufficiency: Secondary | ICD-10-CM

## 2023-02-15 ENCOUNTER — Encounter: Payer: Self-pay | Admitting: Radiology

## 2023-02-26 ENCOUNTER — Ambulatory Visit: Payer: TRICARE For Life (TFL) | Admitting: Nutrition

## 2023-06-05 LAB — COMPREHENSIVE METABOLIC PANEL
ALT: 14 IU/L (ref 0–44)
AST: 16 IU/L (ref 0–40)
Albumin: 4.3 g/dL (ref 3.9–4.9)
Alkaline Phosphatase: 79 IU/L (ref 44–121)
BUN/Creatinine Ratio: 12 (ref 10–24)
BUN: 16 mg/dL (ref 8–27)
Bilirubin Total: 0.4 mg/dL (ref 0.0–1.2)
CO2: 27 mmol/L (ref 20–29)
Calcium: 9.6 mg/dL (ref 8.6–10.2)
Chloride: 102 mmol/L (ref 96–106)
Creatinine, Ser: 1.39 mg/dL — ABNORMAL HIGH (ref 0.76–1.27)
Globulin, Total: 2.5 g/dL (ref 1.5–4.5)
Glucose: 101 mg/dL — ABNORMAL HIGH (ref 70–99)
Potassium: 4.4 mmol/L (ref 3.5–5.2)
Sodium: 142 mmol/L (ref 134–144)
Total Protein: 6.8 g/dL (ref 6.0–8.5)
eGFR: 56 mL/min/{1.73_m2} — ABNORMAL LOW (ref >59)

## 2023-06-11 ENCOUNTER — Ambulatory Visit: Payer: Medicare Other | Admitting: "Endocrinology

## 2023-06-14 ENCOUNTER — Encounter: Payer: Self-pay | Admitting: "Endocrinology

## 2023-06-14 ENCOUNTER — Ambulatory Visit (INDEPENDENT_AMBULATORY_CARE_PROVIDER_SITE_OTHER): Payer: Medicare Other | Admitting: "Endocrinology

## 2023-06-14 ENCOUNTER — Other Ambulatory Visit: Payer: Self-pay

## 2023-06-14 VITALS — BP 94/66 | HR 64 | Ht 65.0 in | Wt 234.6 lb

## 2023-06-14 DIAGNOSIS — Z7985 Long-term (current) use of injectable non-insulin antidiabetic drugs: Secondary | ICD-10-CM | POA: Diagnosis not present

## 2023-06-14 DIAGNOSIS — E1169 Type 2 diabetes mellitus with other specified complication: Secondary | ICD-10-CM | POA: Diagnosis not present

## 2023-06-14 DIAGNOSIS — E2740 Unspecified adrenocortical insufficiency: Secondary | ICD-10-CM

## 2023-06-14 DIAGNOSIS — E782 Mixed hyperlipidemia: Secondary | ICD-10-CM

## 2023-06-14 DIAGNOSIS — Z794 Long term (current) use of insulin: Secondary | ICD-10-CM

## 2023-06-14 LAB — POCT GLYCOSYLATED HEMOGLOBIN (HGB A1C): HbA1c, POC (controlled diabetic range): 7 % (ref 0.0–7.0)

## 2023-06-14 MED ORDER — DEXAMETHASONE 1 MG PO TABS: 1.0000 mg | ORAL_TABLET | Freq: Every day | ORAL | 0 refills | Status: DC

## 2023-06-14 NOTE — Progress Notes (Signed)
06/14/2023, 12:33 PM  Endocrinology follow-up note   Subjective:    Patient ID: Shawn Fleming, male    DOB: 1956/11/18.  Shawn Fleming is being seen in follow-up for adrenal insufficiency.  He was also following up for type 2 DM, but he would like to address his diabetes with his PCP.  PCP:   Leone Payor, FNP.   Past Medical History:  Diagnosis Date   AKI (acute kidney injury) (HCC) 07/29/2020   Arthritis    Diabetes (HCC)    GERD (gastroesophageal reflux disease)    History of kidney stones    Hypercholesteremia    Hypertension    Migraine    Sleep apnea     Past Surgical History:  Procedure Laterality Date   ADENOIDECTOMY     APPENDECTOMY     CENTRAL VENOUS CATHETER INSERTION Left 04/20/2013   Procedure: INSERTION CENTRAL LINE LEFT SUBCLAVIAN;  Surgeon: Fabio Bering, MD;  Location: [facility];  Service: General;  Laterality: Left;   INCISION AND DRAINAGE PERIRECTAL ABSCESS N/A 04/20/2013   Procedure: SHARP SURGICAL DEBRIDEMENT PERINEAL INFECTION;  Surgeon: Fabio Bering, MD;  Location: [facility];  Service: General;  Laterality: N/A;   KNEE SURGERY Right    arthroscopic   SHOULDER SURGERY Bilateral    removal of bone.   TONSILLECTOMY      Social History   Socioeconomic History   Marital status: Divorced    Spouse name: Not on file   Number of children: 6   Years of education: Not on file   Highest education level: Not on file  Occupational History   Occupation: Engineer, materials  Tobacco Use   Smoking status: Former    Packs/day: 0.50    Years: 23.00    Additional pack years: 0.00    Total pack years: 11.50    Types: Cigarettes   Smokeless tobacco: Never  Vaping Use   Vaping Use: Former  Substance and Sexual Activity   Alcohol use: Yes    Comment: rare   Drug use: No   Sexual activity: Not Currently    Birth control/protection: None  Other Topics Concern   Not on file  Social  History Narrative   Not on file   Social Determinants of Health   Financial Resource Strain: Not on file  Food Insecurity: Not on file  Transportation Needs: Not on file  Physical Activity: Not on file  Stress: Not on file  Social Connections: Not on file    Family History  Problem Relation Age of Onset   Cancer Mother    Dementia Father    Colon cancer Neg Hx    Rectal cancer Neg Hx    Stomach cancer Neg Hx    Esophageal cancer Neg Hx     Outpatient Encounter Medications as of 06/14/2023  Medication Sig   [DISCONTINUED] dexamethasone (DECADRON) 1 MG tablet Take 1 tablet (1 mg total) by mouth daily with breakfast.   alfuzosin (UROXATRAL) 10 MG 24 hr tablet Take 10 mg by mouth daily.   ALPRAZolam (XANAX) 0.5 MG tablet Take by mouth.   apixaban (ELIQUIS) 5 MG TABS tablet Take 1 tablet (5 mg total) by  mouth 2 (two) times daily. Please start around August 31, 2020 after you complete the initial starter pack   Cholecalciferol (VITAMIN D3) 5000 units CAPS Take 2 capsules by mouth daily.    dexamethasone (DECADRON) 1 MG tablet Take 1 tablet (1 mg total) by mouth daily with breakfast.   furosemide (LASIX) 40 MG tablet Take 40 mg by mouth daily.   gabapentin (NEURONTIN) 300 MG capsule Take 600 mg by mouth 3 (three) times daily.   hydrocortisone (CORTEF) 10 MG tablet TAKE 1 TABLET DAILY   insulin glargine (LANTUS) 100 UNIT/ML Solostar Pen Inject 60 Units into the skin at bedtime.   MOUNJARO 5 MG/0.5ML Pen Inject 10.5 mg into the skin once a week. Pt taking 7.5mg  weekly   Multiple Vitamins-Minerals (MULTIVITAMIN PO) Take 1 tablet by mouth daily.   Oxycodone HCl 10 MG TABS Take 10 mg by mouth 4 (four) times daily.   pantoprazole (PROTONIX) 40 MG tablet Take 40 mg by mouth daily.   potassium chloride (KLOR-CON) 10 MEQ tablet Take 10 mEq by mouth daily.   rosuvastatin (CRESTOR) 10 MG tablet Take 10 mg by mouth daily.   Sodium Sulfate-Mag Sulfate-KCl (SUTAB) [phone] MG TABS     SUMAtriptan (IMITREX) 100 MG tablet Take 100 mg by mouth as directed.   No facility-administered encounter medications on file as of 06/14/2023.    ALLERGIES: Allergies  Allergen Reactions   Prednisone     VACCINATION STATUS: There is no immunization history for the selected administration types on file for this patient.  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 He remains on hydrocortisone 10 mg p.o. daily.  He does not have acute symptoms today.  His blood pressure is marginal at 94/66.  Patient has no symptoms.  He also has type 2 diabetes and hyperlipidemia following up with his PCP.  His most recent A1c was said to be 7%.  He wears a freestyle libre device showing 73% time in range, 26% level 1 hyperglycemia.  His average blood glucose is 157.  Hyperlipidemia This is a chronic problem. The current episode started more than 1 year ago. The problem is controlled. Exacerbating diseases include chronic renal disease, diabetes and obesity. Pertinent negatives include no chest pain, myalgias or shortness of breath. Current antihyperlipidemic treatment includes statins. Risk factors for coronary artery disease include diabetes mellitus, dyslipidemia, family history, hypertension, male sex, obesity and a sedentary lifestyle.  Hypertension This is a chronic problem. The current episode started more than 1 year ago. Pertinent negatives include no chest pain, headaches, neck pain, palpitations or shortness of breath. Risk factors for coronary artery disease include diabetes mellitus, dyslipidemia, obesity, male gender, sedentary lifestyle, smoking/tobacco exposure and family history. Past treatments include ACE inhibitors. Hypertensive end-organ damage includes kidney disease. Identifiable causes of hypertension include chronic renal disease.      Review of Systems  Constitutional:  Negative for chills, fatigue, fever and unexpected weight change.   HENT:  Negative for dental problem, mouth sores and trouble swallowing.   Eyes:  Negative for visual disturbance.  Respiratory:  Negative for cough, choking, chest tightness, shortness of breath and wheezing.   Cardiovascular:  Negative for chest pain, palpitations and leg swelling.  Gastrointestinal:  Negative for abdominal distention, abdominal pain, constipation, diarrhea, nausea and vomiting.  Endocrine: Negative for polydipsia, polyphagia and polyuria.  Genitourinary:  Negative for dysuria, flank pain, hematuria and urgency.  Musculoskeletal:  Negative for back pain, gait problem, myalgias and neck  pain.  Skin:  Negative for pallor, rash and wound.  Neurological:  Positive for dizziness. Negative for seizures, syncope, weakness, numbness and headaches.  Psychiatric/Behavioral:  Negative for confusion and dysphoric mood.     Objective:       06/14/2023    9:28 AM 12/08/2022   10:52 AM 12/07/2022    1:54 PM  Vitals with BMI  Height 5\' 5"  5\' 5"  5\' 5"   Weight 234 lbs 10 oz 228 lbs 229 lbs 10 oz  BMI 39.04 37.94 38.21  Systolic 94 110 100  Diastolic 66 88 72  Pulse 64 108 96    BP 94/66   Pulse 64   Ht 5\' 5"  (1.651 m)   Wt 234 lb 9.6 oz (106.4 kg)   BMI 39.04 kg/m   Wt Readings from Last 3 Encounters:  06/14/23 234 lb 9.6 oz (106.4 kg)  12/08/22 228 lb (103.4 kg)  12/07/22 229 lb 9.6 oz (104.1 kg)        CMP ( most recent) CMP     Component Value Date/Time   NA 142 06/04/2023 0812   K 4.4 06/04/2023 0812   CL 102 06/04/2023 0812   CO2 27 06/04/2023 0812   GLUCOSE 101 (H) 06/04/2023 0812   GLUCOSE 235 (H) 08/01/2020 0758   BUN 16 06/04/2023 0812   CREATININE 1.39 (H) 06/04/2023 0812   CALCIUM 9.6 06/04/2023 0812   PROT 6.8 06/04/2023 0812   ALBUMIN 4.3 06/04/2023 0812   AST 16 06/04/2023 0812   ALT 14 06/04/2023 0812   ALKPHOS 79 06/04/2023 0812   BILITOT 0.4 06/04/2023 0812   GFRNONAA 51 (L) 08/01/2020 0758   GFRAA 59 (L) 08/01/2020 0758      Diabetic Labs (most recent): Lab Results  Component Value Date   HGBA1C 7.0 06/14/2023   HGBA1C 7.1 08/08/2022   HGBA1C 10.7 (A) 12/22/2021     Lab Results  Component Value Date   TSH 3.490 12/16/2021   TSH 0.622 07/29/2020   TSH 2.456 04/30/2013   FREET4 1.36 12/16/2021    Lipid Panel     Component Value Date/Time   CHOL 102 08/08/2022 0000   TRIG 147 08/08/2022 0000   HDL 31 (A) 08/08/2022 0000   LDLCALC 46 08/08/2022 0000      Assessment & Plan:   1) Adrenal insufficiency During the time of diagnosis, he was found to have profoundly low basal cortisol at 1.8.  ACTH stimulation test showed inadequate adrenal response with 12.2 at 30 minutes, 9.3 at 60 minutes.  Etiology of her insufficiency is not settled yet.  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   He will be considered for repeat ACTH stim test after switching his hydrocortisone to dexamethasone 1 mg p.o. daily.  Will return in 15 days with his results.  If he continues have adrenal insufficiency, he will be resumed back on hydrocortisone.    2. 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  He wears a freestyle libre device showing 73% time in range, 26% level 1 hyperglycemia, 1% level 2 hyperglycemia.  His point-of-care A1c is 7%.  He is advised to continue Lantus 16 units nightly, Mounjaro 10 mg subcutaneously weekly.  This medication is sent to advance to the next higher dose of 12.5 mg subcutaneously weekly.  He would like to continue follow-up with  his PCP on diabetes management.      3) Blood Pressure /Hypertension: His blood pressure is marginally controlled , he will not tolerate any antihypertensive medications including lisinopril for now.    4) Lipids/Hyperlipidemia: He does not have recent lipid panel to review.  He has lab appointment with  his PMD.  We will request for a copy.  He is advised to continue Crestor 10 mg p.o. nightly.  Side effects and precautions discussed with him.      5)  Weight/Diet:  Body mass index is 39.04 kg/m.  -   clearly complicating his diabetes care.   he is  a candidate for weight loss. I discussed with him the fact that loss of 5 - 10% of his  current body weight will have the most impact on his diabetes management.  Exercise, and detailed carbohydrates information provided  -  detailed on discharge instructions.   6) Chronic Care/Health Maintenance:  -he  is on and Statin medications and  is encouraged to initiate and continue to follow up with Ophthalmology, Dentist,  Podiatrist at least yearly or according to recommendations, and advised to   stay away from smoking. I have recommended yearly flu vaccine and pneumonia vaccine at least every 5 years; moderate intensity exercise for up to 150 minutes weekly; and  sleep for at least 7 hours a day.  His recent ABI was negative for PAD in April 2021.  This study will be repeated in April 2026 or sooner if needed.    - he is  advised to maintain close follow up with Leone Payor, FNP for primary care needs, as well as his other providers for optimal and coordinated care.    I spent  21  minutes in the care of the patient today including review of labs from Thyroid Function, CMP, and other relevant labs ; imaging/biopsy records (current and previous including abstractions from other facilities); face-to-face time discussing  his lab results and symptoms, medications doses, his options of short and long term treatment based on the latest standards of care / guidelines;   and documenting the encounter.  Lauris Poag  participated in the discussions, expressed understanding, and voiced agreement with the above plans.  All questions were answered to his satisfaction. he is encouraged to contact clinic should he have any questions or concerns prior to his  return visit.   Follow up plan: - Return in about 2 weeks (around 06/28/2023), or labs after July 7, for F/U with Pre-visit Labs.  Marquis Lunch, MD [facility] [address] [place], [place] [address] Phone: [phone]  Fax: [phone]    06/14/2023, 12:33 PM  This note was partially dictated with voice recognition software. Similar sounding words can be transcribed inadequately or may not  be corrected upon review.

## 2023-06-18 ENCOUNTER — Encounter (HOSPITAL_COMMUNITY): Payer: Self-pay | Admitting: "Endocrinology

## 2023-06-18 ENCOUNTER — Encounter (HOSPITAL_COMMUNITY)
Admission: RE | Admit: 2023-06-18 | Discharge: 2023-06-18 | Disposition: A | Discharge status: Home / Self Care | Payer: Medicare Other | Source: Ambulatory Visit | Attending: "Endocrinology | Admitting: "Endocrinology

## 2023-06-18 VITALS — BP 106/73 | HR 95 | Temp 98.2°F | Resp 16

## 2023-06-18 DIAGNOSIS — E2740 Unspecified adrenocortical insufficiency: Secondary | ICD-10-CM | POA: Insufficient documentation

## 2023-06-18 LAB — ACTH STIMULATION, 3 TIME POINTS
Cortisol, 30 Min: 6.7 ug/dL
Cortisol, 60 Min: 9 ug/dL
Cortisol, Base: 0.4 ug/dL

## 2023-06-18 MED ORDER — COSYNTROPIN 0.25 MG IJ SOLR
0.2500 mg | Freq: Once | INTRAMUSCULAR | Status: AC
  Administered 2023-06-18: 0.25 mg via INTRAMUSCULAR
  Filled 2023-06-18: qty 0.25

## 2023-06-18 NOTE — Progress Notes (Signed)
Diagnosis: Adrenal Insufficiency  Provider:  Danella Deis MD  Procedure: Injection  Cortrosyn (cosyntropin), Dose: 0.25MG  , Site: intramuscular, Number of injections: 1  Post Care: Observation period completed and Peripheral IV Discontinued  Discharge: Condition: Good, Destination: Home . AVS Provided  Performed by:  Enzo Montgomery, RN

## 2023-06-28 ENCOUNTER — Ambulatory Visit: Payer: Medicare Other | Admitting: "Endocrinology

## 2023-06-29 ENCOUNTER — Telehealth: Payer: Self-pay | Admitting: "Endocrinology

## 2023-06-29 NOTE — Telephone Encounter (Signed)
Pt left a VM to reschedule. I called pt to let him know this was his 4th no show since October and we can reschedule but if he missed this one he would be sent a dismissal letter. Patient said that he did not like that and he would speak to Dr Fransico Him about this. I made him aware that was fine but this was our office policy. Pt hung up

## 2023-07-04 ENCOUNTER — Other Ambulatory Visit: Payer: Self-pay

## 2023-07-23 ENCOUNTER — Other Ambulatory Visit (HOSPITAL_COMMUNITY): Payer: Self-pay

## 2023-07-23 ENCOUNTER — Other Ambulatory Visit (HOSPITAL_BASED_OUTPATIENT_CLINIC_OR_DEPARTMENT_OTHER): Payer: Self-pay

## 2023-07-23 MED ORDER — MOUNJARO 12.5 MG/0.5ML ~~LOC~~ SOAJ
12.5000 mg | SUBCUTANEOUS | 0 refills | Status: DC
  Filled 2023-07-23: qty 2, 28d supply, fill #0

## 2023-07-24 ENCOUNTER — Other Ambulatory Visit (HOSPITAL_COMMUNITY): Payer: Self-pay

## 2023-07-27 ENCOUNTER — Ambulatory Visit (INDEPENDENT_AMBULATORY_CARE_PROVIDER_SITE_OTHER): Payer: Medicare Other | Admitting: "Endocrinology

## 2023-07-27 ENCOUNTER — Encounter: Payer: Self-pay | Admitting: "Endocrinology

## 2023-07-27 VITALS — BP 106/58 | HR 88 | Ht 65.0 in | Wt 231.4 lb

## 2023-07-27 DIAGNOSIS — Z7985 Long-term (current) use of injectable non-insulin antidiabetic drugs: Secondary | ICD-10-CM

## 2023-07-27 DIAGNOSIS — I10 Essential (primary) hypertension: Secondary | ICD-10-CM

## 2023-07-27 DIAGNOSIS — Z794 Long term (current) use of insulin: Secondary | ICD-10-CM

## 2023-07-27 DIAGNOSIS — E1169 Type 2 diabetes mellitus with other specified complication: Secondary | ICD-10-CM

## 2023-07-27 DIAGNOSIS — E2740 Unspecified adrenocortical insufficiency: Secondary | ICD-10-CM

## 2023-07-27 DIAGNOSIS — E785 Hyperlipidemia, unspecified: Secondary | ICD-10-CM | POA: Diagnosis not present

## 2023-07-27 DIAGNOSIS — N182 Chronic kidney disease, stage 2 (mild): Secondary | ICD-10-CM

## 2023-07-27 DIAGNOSIS — E1122 Type 2 diabetes mellitus with diabetic chronic kidney disease: Secondary | ICD-10-CM

## 2023-07-27 MED ORDER — HYDROCORTISONE 10 MG PO TABS: ORAL_TABLET | ORAL | 1 refills | Status: DC

## 2023-07-27 NOTE — Progress Notes (Signed)
07/27/2023, 11:17 AM  Endocrinology follow-up note   Subjective:    Patient ID: Shawn Fleming, male    DOB: August 03, 1956.  Shawn Fleming is being seen in follow-up for adrenal insufficiency.  He was also following up for type 2 DM, but he would like to address his diabetes with his PCP.  PCP:   Leone Payor, FNP.   Past Medical History:  Diagnosis Date   AKI (acute kidney injury) (HCC) 07/29/2020   Arthritis    Diabetes (HCC)    GERD (gastroesophageal reflux disease)    History of kidney stones    Hypercholesteremia    Hypertension    Migraine    Sleep apnea     Past Surgical History:  Procedure Laterality Date   ADENOIDECTOMY     APPENDECTOMY     CENTRAL VENOUS CATHETER INSERTION Left 04/20/2013   Procedure: INSERTION CENTRAL LINE LEFT SUBCLAVIAN;  Surgeon: Fabio Bering, MD;  Location: [facility];  Service: General;  Laterality: Left;   INCISION AND DRAINAGE PERIRECTAL ABSCESS N/A 04/20/2013   Procedure: SHARP SURGICAL DEBRIDEMENT PERINEAL INFECTION;  Surgeon: Fabio Bering, MD;  Location: [facility];  Service: General;  Laterality: N/A;   KNEE SURGERY Right    arthroscopic   SHOULDER SURGERY Bilateral    removal of bone.   TONSILLECTOMY      Social History   Socioeconomic History   Marital status: Divorced    Spouse name: Not on file   Number of children: 6   Years of education: Not on file   Highest education level: Not on file  Occupational History   Occupation: Engineer, materials  Tobacco Use   Smoking status: Former    Current packs/day: 0.50    Average packs/day: 0.5 packs/day for 23.0 years (11.5 ttl pk-yrs)    Types: Cigarettes   Smokeless tobacco: Never  Vaping Use   Vaping status: Former  Substance and Sexual Activity   Alcohol use: Yes    Comment: rare   Drug use: No   Sexual activity: Not Currently    Birth control/protection: None  Other Topics Concern   Not on file   Social History Narrative   Not on file   Social Determinants of Health   Financial Resource Strain: Not on file  Food Insecurity: Not on file  Transportation Needs: Not on file  Physical Activity: Not on file  Stress: Not on file  Social Connections: Not on file    Family History  Problem Relation Age of Onset   Cancer Mother    Dementia Father    Colon cancer Neg Hx    Rectal cancer Neg Hx    Stomach cancer Neg Hx    Esophageal cancer Neg Hx     Outpatient Encounter Medications as of 07/27/2023  Medication Sig   alfuzosin (UROXATRAL) 10 MG 24 hr tablet Take 10 mg by mouth daily.   ALPRAZolam (XANAX) 0.5 MG tablet Take by mouth.   apixaban (ELIQUIS) 5 MG TABS tablet Take 1 tablet (5 mg total) by mouth 2 (two) times daily. Please start around August 31, 2020 after you complete the initial starter pack   Cholecalciferol (VITAMIN D3) 5000  units CAPS Take 2 capsules by mouth daily.    dexamethasone (DECADRON) 1 MG tablet Take 1 tablet (1 mg total) by mouth daily with breakfast.   furosemide (LASIX) 40 MG tablet Take 40 mg by mouth daily.   gabapentin (NEURONTIN) 300 MG capsule Take 600 mg by mouth 3 (three) times daily.   hydrocortisone (CORTEF) 10 MG tablet Take 10 mg  daily at 8 AM and 10 mg daily at noon   insulin glargine (LANTUS) 100 UNIT/ML Solostar Pen Inject 60 Units into the skin at bedtime.   MOUNJARO 5 MG/0.5ML Pen Inject 10.5 mg into the skin once a week. Pt taking 7.5mg  weekly   Multiple Vitamins-Minerals (MULTIVITAMIN PO) Take 1 tablet by mouth daily.   Oxycodone HCl 10 MG TABS Take 10 mg by mouth 4 (four) times daily.   pantoprazole (PROTONIX) 40 MG tablet Take 40 mg by mouth daily.   potassium chloride (KLOR-CON) 10 MEQ tablet Take 10 mEq by mouth daily.   rosuvastatin (CRESTOR) 10 MG tablet Take 10 mg by mouth daily.   Sodium Sulfate-Mag Sulfate-KCl (SUTAB) [phone] MG TABS    SUMAtriptan (IMITREX) 100 MG tablet Take 100 mg by mouth as directed.    tirzepatide (MOUNJARO) 12.5 MG/0.5ML Pen Inject 12.5 mg into the skin once a week.   [DISCONTINUED] hydrocortisone (CORTEF) 10 MG tablet TAKE 1 TABLET DAILY   No facility-administered encounter medications on file as of 07/27/2023.    ALLERGIES: Allergies  Allergen Reactions   Prednisone     VACCINATION STATUS: There is no immunization history for the selected administration types on file for this patient.  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  In the interim, he was switched to dexamethasone briefly so that he can undergo ACTH stimulation test which was significant for primary adrenal insufficiency-see below.  He has resumed his hydrocortisone 10 mg p.o. daily at breakfast.  He has no new complaints today.   He also has type 2 diabetes and hyperlipidemia following up with his PCP.  His most recent A1c was said to be 7%.  He wears a freestyle libre device showing 66% time in range, 31% level 1 hyperglycemia.  His average blood glucose is 170.  Hyperlipidemia This is a chronic problem. The current episode started more than 1 year ago. The problem is controlled. Exacerbating diseases include chronic renal disease, diabetes and obesity. Pertinent negatives include no chest pain, myalgias or shortness of breath. Current antihyperlipidemic treatment includes statins. Risk factors for coronary artery disease include diabetes mellitus, dyslipidemia, family history, hypertension, male sex, obesity and a sedentary lifestyle.  Hypertension This is a chronic problem. The current episode started more than 1 year ago. Pertinent negatives include no chest pain, headaches, neck pain, palpitations or shortness of breath. Risk factors for coronary artery disease include diabetes mellitus, dyslipidemia, obesity, male gender, sedentary lifestyle, smoking/tobacco exposure and family history. Past treatments include ACE inhibitors. Hypertensive end-organ damage  includes kidney disease. Identifiable causes of hypertension include chronic renal disease.      Review of Systems  Constitutional:  Negative for chills, fatigue, fever and unexpected weight change.  HENT:  Negative for dental problem, mouth sores and trouble swallowing.   Eyes:  Negative for visual disturbance.  Respiratory:  Negative for cough, choking, chest tightness, shortness of breath and wheezing.   Cardiovascular:  Negative for chest pain, palpitations and leg swelling.  Gastrointestinal:  Negative for abdominal distention, abdominal pain, constipation, diarrhea, nausea and vomiting.  Endocrine: Negative for polydipsia, polyphagia and polyuria.  Genitourinary:  Negative for dysuria, flank pain, hematuria and urgency.  Musculoskeletal:  Negative for back pain, gait problem, myalgias and neck pain.  Skin:  Negative for pallor, rash and wound.  Neurological:  Positive for dizziness. Negative for seizures, syncope, weakness, numbness and headaches.  Psychiatric/Behavioral:  Negative for confusion and dysphoric mood.     Objective:       07/27/2023   10:32 AM 06/18/2023   10:20 AM 06/14/2023    9:28 AM  Vitals with BMI  Height 5\' 5"   5\' 5"   Weight 231 lbs 6 oz  234 lbs 10 oz  BMI 38.51  39.04  Systolic 106 106 94  Diastolic 58 73 66  Pulse 88 95 64    BP (!) 106/58   Pulse 88   Ht 5\' 5"  (1.651 m)   Wt 231 lb 6.4 oz (105 kg)   BMI 38.51 kg/m   Wt Readings from Last 3 Encounters:  07/27/23 231 lb 6.4 oz (105 kg)  06/14/23 234 lb 9.6 oz (106.4 kg)  12/08/22 228 lb (103.4 kg)        CMP ( most recent) CMP     Component Value Date/Time   NA 142 06/04/2023 0812   K 4.4 06/04/2023 0812   CL 102 06/04/2023 0812   CO2 27 06/04/2023 0812   GLUCOSE 101 (H) 06/04/2023 0812   GLUCOSE 235 (H) 08/01/2020 0758   BUN 16 06/04/2023 0812   CREATININE 1.39 (H) 06/04/2023 0812   CALCIUM 9.6 06/04/2023 0812   PROT 6.8 06/04/2023 0812   ALBUMIN 4.3 06/04/2023 0812   AST 16  06/04/2023 0812   ALT 14 06/04/2023 0812   ALKPHOS 79 06/04/2023 0812   BILITOT 0.4 06/04/2023 0812   GFRNONAA 51 (L) 08/01/2020 0758   GFRAA 59 (L) 08/01/2020 0758     Diabetic Labs (most recent): Lab Results  Component Value Date   HGBA1C 7.0 06/14/2023   HGBA1C 7.1 08/08/2022   HGBA1C 10.7 (A) 12/22/2021     Lab Results  Component Value Date   TSH 3.490 12/16/2021   TSH 0.622 07/29/2020   TSH 2.456 04/30/2013   FREET4 1.36 12/16/2021    Lipid Panel     Component Value Date/Time   CHOL 102 08/08/2022 0000   TRIG 147 08/08/2022 0000   HDL 31 (A) 08/08/2022 0000   LDLCALC 46 08/08/2022 0000      Assessment & Plan:   1) Adrenal insufficiency  During the time of diagnosis, he was found to have profoundly low basal cortisol at 1.8.  ACTH stimulation test showed inadequate adrenal response with 12.2 at 30 minutes, 9.3 at 60 minutes.  More recently, he was switched briefly to dexamethasone to repeat ACTH stimulation test test which confirmed similar findings: Baseline 0.4, 30 minutes cortisol 6.7, 60 minutes cortisol 9.  Etiology of her insufficiency is not settled yet.  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  This establishes his adrenal insufficiency and I had a long discussion with him about the need for steroids replacement for life.  Sick day steroids rule was discussed with him that he can double his doses during days of severe distress, surgical procedure or severe illness.  At this time he will be continued on glucocorticoid replacement with 10 mg of hydrocortisone twice a day.  He will be assessed for the need for any mineralocorticoid replacement on his subsequent  visit in 6 months.    2. 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  He wears a freestyle libre device showing 66% time in  range, 31% level 1 hyperglycemia, 3% level 2 hyperglycemia.  His last visit point-of-care A1c was 7%.   He is advised to continue Lantus 16 units nightly, Mounjaro 12.5 mg subcutaneously weekly.   He would like to continue follow-up with his PCP on diabetes management.      3) Blood Pressure /Hypertension: His blood pressure is marginally controlled , he will not tolerate any antihypertensive medications including lisinopril for now.  A higher dose of hydrocortisone may help stabilize his blood pressure.  If his blood pressure remains marginal, he will be considered for fludrocortisone next visit.   4) Lipids/Hyperlipidemia: He does not have recent lipid panel to review.  He has lab appointment with his PMD.  We will request for a copy.  He is advised to continue Crestor 10 mg p.o. nightly.  Side effects and precautions discussed with him.      5)  Weight/Diet:  Body mass index is 38.51 kg/m.  -   clearly complicating his diabetes care.   he is  a candidate for weight loss. I discussed with him the fact that loss of 5 - 10% of his  current body weight will have the most impact on his diabetes management.  Exercise, and detailed carbohydrates information provided  -  detailed on discharge instructions.   6) Chronic Care/Health Maintenance:  -he  is on and Statin medications and  is encouraged to initiate and continue to follow up with Ophthalmology, Dentist,  Podiatrist at least yearly or according to recommendations, and advised to   stay away from smoking. I have recommended yearly flu vaccine and pneumonia vaccine at least every 5 years; moderate intensity exercise for up to 150 minutes weekly; and  sleep for at least 7 hours a day.  His recent ABI was negative for PAD in April 2021.  This study will be repeated in April 2026 or sooner if needed.    - he is  advised to maintain close follow up with Leone Payor, FNP for primary care needs, as well as his other providers for optimal and  coordinated care.    I spent  25 minutes in the care of the patient today including review of labs from Thyroid Function, CMP, his CGM device, and other relevant labs ; imaging/biopsy records (current and previous including abstractions from other facilities); face-to-face time discussing  his lab results and symptoms, medications doses, his options of short and long term treatment based on the latest standards of care / guidelines;   and documenting the encounter.  Lauris Poag  participated in the discussions, expressed understanding, and voiced agreement with the above plans.  All questions were answered to his satisfaction. he is encouraged to contact clinic should he have any questions or concerns prior to his return visit.   Follow up plan: - Return in about 6 months (around 01/27/2024) for F/U with Pre-visit Labs.  Marquis Lunch, MD [facility] [address] [place], [place] [address] Phone: [phone]  Fax: [phone]    07/27/2023, 11:17 AM  This note was partially dictated with voice recognition software. Similar sounding words can be transcribed inadequately or may not  be corrected upon review.

## 2023-09-14 ENCOUNTER — Encounter: Payer: Self-pay | Admitting: Dietician

## 2023-09-14 ENCOUNTER — Encounter: Payer: Medicare Other | Attending: Family Medicine | Admitting: Dietician

## 2023-09-14 VITALS — Wt 229.5 lb

## 2023-09-14 DIAGNOSIS — Z713 Dietary counseling and surveillance: Secondary | ICD-10-CM | POA: Diagnosis not present

## 2023-09-14 DIAGNOSIS — E1165 Type 2 diabetes mellitus with hyperglycemia: Secondary | ICD-10-CM | POA: Diagnosis present

## 2023-09-14 DIAGNOSIS — E119 Type 2 diabetes mellitus without complications: Secondary | ICD-10-CM

## 2023-09-14 NOTE — Patient Instructions (Addendum)
Goals:   When snacking, aim to include a complex carb and protein (choose from list).   Start to aim to include more vegetables in your diet.  Your accepted vegetables: green beans, cole slaw, carrots, celery, tomatoes.   Start increasing walking.

## 2023-09-14 NOTE — Progress Notes (Signed)
Diabetes Self-Management Education  Visit Type: First/Initial  Appt. Start Time: 0925 Appt. End Time: 1025  09/14/2023  [name], identified by name and date of birth, is a 67 y.o. male with a diagnosis of Diabetes: Type 2.   ASSESSMENT  History includes: arthritis, type 2 diabetes, GERD, sleep apnea, HLD, HTN, kidney disease Labs noted: 03/22/23 A1c 6.8  Medications include: insulin glargine (lantus), mounjaro Supplements:  Pt states he went from 60 units down to 50 units of lantus.   Pt has a CGM. He states some mornings his blood glucose is 65-70mg /dL but this morning it is 170mg /dL. Pt states his sugar sometimes goes to 200-300 but not often.   Pt states he does not like salads. He states he has not tried broccoli since he was a kid and is not going to try it. Pt responds "yuck" or "ew" to all vegetables and is not willing to try more.   Pt reports he has no normal eating schedule and works 4 hour shifts. He states he gets up at 9am and takes his meds with a V8 juice and goes back to bed, then gets ready for work at [employer], and eats raisins and cookies until he gets home around 8pm and has a sandwich and snacks on raisins after.   Pt states he has twin boys and an older son with Asperger's who lives with him.   Pt states he has neuropathy and compressed discs so exercise is difficult. He states he walks around the lobby at work.   Accepted vegetables: green beans, cole slaw, carrots ("eh"), celery occasionally, tomatoes.   Weight 229 lb 8 oz (104.1 kg). Body mass index is 38.19 kg/m.   Diabetes Self-Management Education - 09/14/23 0923       Visit Information   Visit Type First/Initial      Initial Visit   Diabetes Type Type 2    Date Diagnosed 6-7 years ago    Are you currently following a meal plan? No    Are you taking your medications as prescribed? Yes      Health Coping   How would you rate your overall health? Good      Psychosocial Assessment    Patient Belief/Attitude about Diabetes Motivated to manage diabetes    What is the hardest part about your diabetes right now, causing you the most concern, or is the most worrisome to you about your diabetes?   Making healty food and beverage choices    Self-care barriers None    Self-management support Doctor's office    Other persons present Patient    Patient Concerns Nutrition/Meal planning    Special Needs None    Preferred Learning Style No preference indicated    Learning Readiness Not Ready    How often do you need to have someone help you when you read instructions, pamphlets, or other written materials from your doctor or pharmacy? 1 - Never      Pre-Education Assessment   Patient understands the diabetes disease and treatment process. Needs Instruction    Patient understands incorporating nutritional management into lifestyle. Needs Instruction    Patient undertands incorporating physical activity into lifestyle. Needs Instruction    Patient understands using medications safely. Needs Instruction    Patient understands monitoring blood glucose, interpreting and using results Needs Instruction    Patient understands prevention, detection, and treatment of acute complications. Needs Instruction    Patient understands prevention, detection, and treatment of chronic complications. Needs Instruction  Patient understands how to develop strategies to address psychosocial issues. Needs Instruction    Patient understands how to develop strategies to promote health/change behavior. Needs Instruction      Complications   Last HgB A1C per patient/outside source 7.1 %    How often do you check your blood sugar? > 4 times/day    Fasting Blood glucose range (mg/dL) 811-914;78-295    Postprandial Blood glucose range (mg/dL) 621-308;657-846    Number of hypoglycemic episodes per month 1    Have you had a dilated eye exam in the past 12 months? No    Have you had a dental exam in the past 12  months? Yes    Are you checking your feet? Yes    How many days per week are you checking your feet? 7      Dietary Intake   Breakfast V8    Snack (morning) none    Lunch couple boxes of raisins and V8 and cookies    Snack (afternoon) couple boxes of raisins and V8 and cookies    Dinner 8:30pm: sandwich and soup OR arroz con pollo from [employer] (evening) couple boxes of raisins    Beverage(s) V8 juice, coffee, 64 oz water,      Activity / Exercise   Activity / Exercise Type ADL's    How many days per week do you exercise? 0    How many minutes per day do you exercise? 0    Total minutes per week of exercise 0      Patient Education   Previous Diabetes Education Yes (please comment)    Disease Pathophysiology Explored patient's options for treatment of their diabetes;Definition of diabetes, type 1 and 2, and the diagnosis of diabetes    Healthy Eating Role of diet in the treatment of diabetes and the relationship between the three main macronutrients and blood glucose level;Plate Method;Reviewed blood glucose goals for pre and post meals and how to evaluate the patients' food intake on their blood glucose level.;Meal timing in regards to the patients' current diabetes medication.;Meal options for control of blood glucose level and chronic complications.    Being Active Role of exercise on diabetes management, blood pressure control and cardiac health.;Helped patient identify appropriate exercises in relation to his/her diabetes, diabetes complications and other health issue.    Medications Taught/reviewed insulin/injectables, injection, site rotation, insulin/injectables storage and needle disposal.    Monitoring Taught/evaluated CGM (comment)    Chronic complications Relationship between chronic complications and blood glucose control;Identified and discussed with patient  current chronic complications;Nephropathy, what it is, prevention of, the use of ACE, ARB's and  early detection of through urine microalbumia.    Diabetes Stress and Support Worked with patient to identify barriers to care and solutions;Identified and addressed patients feelings and concerns about diabetes;Role of stress on diabetes    Lifestyle and Health Coping Lifestyle issues that need to be addressed for better diabetes care      Individualized Goals (developed by patient)   Nutrition General guidelines for healthy choices and portions discussed    Physical Activity Exercise 3-5 times per week;15 minutes per day;30 minutes per day    Medications take my medication as prescribed    Monitoring  Consistenly use CGM    Problem Solving Eating Pattern    Reducing Risk examine blood glucose patterns;do foot checks daily;treat hypoglycemia with 15 grams of carbs if blood glucose less than 70mg /dL    Health Coping Ask  for help with psychological, social, or emotional issues      Post-Education Assessment   Patient understands the diabetes disease and treatment process. Needs Review    Patient understands incorporating nutritional management into lifestyle. Needs Review    Patient undertands incorporating physical activity into lifestyle. Needs Review    Patient understands using medications safely. Needs Review    Patient understands monitoring blood glucose, interpreting and using results Needs Review    Patient understands prevention, detection, and treatment of acute complications. Needs Review    Patient understands prevention, detection, and treatment of chronic complications. Needs Review    Patient understands how to develop strategies to address psychosocial issues. Needs Review    Patient understands how to develop strategies to promote health/change behavior. Needs Review      Outcomes   Expected Outcomes Demonstrated limited interest in learning.  Expect minimal changes    Future DMSE 3-4 months    Program Status Not Completed             Individualized Plan for  Diabetes Self-Management Training:   Learning Objective:  Patient will have a greater understanding of diabetes self-management. Patient education plan is to attend individual and/or group sessions per assessed needs and concerns.   Plan:   Patient Instructions  Goals:   When snacking, aim to include a complex carb and protein (choose from list).   Start to aim to include more vegetables in your diet.  Your accepted vegetables: green beans, cole slaw, carrots, celery, tomatoes.   Start increasing walking.   Expected Outcomes:  Demonstrated limited interest in learning.  Expect minimal changes  Education material provided: ADA - How to Thrive: A Guide for Your Journey with Diabetes and Snack sheet  If problems or questions, patient to contact team via:  Phone  Future DSME appointment: 3-4 months

## 2023-11-20 ENCOUNTER — Other Ambulatory Visit (HOSPITAL_COMMUNITY): Payer: Self-pay | Admitting: Family Medicine

## 2023-11-20 DIAGNOSIS — L729 Follicular cyst of the skin and subcutaneous tissue, unspecified: Secondary | ICD-10-CM

## 2023-11-30 ENCOUNTER — Ambulatory Visit (HOSPITAL_COMMUNITY)
Admission: RE | Admit: 2023-11-30 | Discharge: 2023-11-30 | Disposition: A | Discharge status: Home / Self Care | Payer: Medicare Other | Source: Ambulatory Visit | Attending: Family Medicine | Admitting: Family Medicine

## 2023-11-30 DIAGNOSIS — L729 Follicular cyst of the skin and subcutaneous tissue, unspecified: Secondary | ICD-10-CM | POA: Diagnosis present

## 2023-12-14 ENCOUNTER — Encounter (HOSPITAL_COMMUNITY): Payer: Self-pay | Admitting: Nurse Practitioner

## 2023-12-14 DIAGNOSIS — Q180 Sinus, fistula and cyst of branchial cleft: Secondary | ICD-10-CM

## 2023-12-17 ENCOUNTER — Encounter (HOSPITAL_COMMUNITY): Payer: Self-pay | Admitting: Nurse Practitioner

## 2023-12-17 DIAGNOSIS — L729 Follicular cyst of the skin and subcutaneous tissue, unspecified: Secondary | ICD-10-CM

## 2023-12-18 ENCOUNTER — Encounter (HOSPITAL_COMMUNITY): Payer: Self-pay | Admitting: Nurse Practitioner

## 2023-12-18 DIAGNOSIS — L729 Follicular cyst of the skin and subcutaneous tissue, unspecified: Secondary | ICD-10-CM

## 2023-12-24 ENCOUNTER — Other Ambulatory Visit (HOSPITAL_COMMUNITY): Payer: Self-pay | Admitting: Nurse Practitioner

## 2023-12-24 DIAGNOSIS — R930 Abnormal findings on diagnostic imaging of skull and head, not elsewhere classified: Secondary | ICD-10-CM

## 2023-12-28 ENCOUNTER — Ambulatory Visit (HOSPITAL_COMMUNITY)
Admission: RE | Admit: 2023-12-28 | Discharge: 2023-12-28 | Disposition: A | Discharge status: Home / Self Care | Payer: Medicare Other | Source: Ambulatory Visit | Attending: Nurse Practitioner | Admitting: Nurse Practitioner

## 2023-12-28 DIAGNOSIS — R930 Abnormal findings on diagnostic imaging of skull and head, not elsewhere classified: Secondary | ICD-10-CM | POA: Insufficient documentation

## 2024-01-01 ENCOUNTER — Other Ambulatory Visit (HOSPITAL_COMMUNITY): Payer: Self-pay | Admitting: Neurosurgery

## 2024-01-01 DIAGNOSIS — M47816 Spondylosis without myelopathy or radiculopathy, lumbar region: Secondary | ICD-10-CM

## 2024-01-09 ENCOUNTER — Encounter: Payer: Self-pay | Admitting: Nurse Practitioner

## 2024-01-11 ENCOUNTER — Ambulatory Visit (HOSPITAL_COMMUNITY)
Admission: RE | Admit: 2024-01-11 | Discharge: 2024-01-11 | Disposition: A | Discharge status: Home / Self Care | Payer: Medicare Other | Source: Ambulatory Visit | Attending: Neurosurgery | Admitting: Neurosurgery

## 2024-01-11 DIAGNOSIS — M47816 Spondylosis without myelopathy or radiculopathy, lumbar region: Secondary | ICD-10-CM | POA: Diagnosis present

## 2024-01-14 NOTE — Progress Notes (Signed)
[facility] [address], [place] [address]   Clinic Day:  01/15/2024  Referring physician: Micael Hampshire, FNP  Patient Care Team: Benita Stabile, MD as PCP - General (Internal Medicine) Doreatha Massed, MD as Medical Oncologist (Medical Oncology) Therese Sarah, RN as Oncology Nurse Navigator (Medical Oncology)   ASSESSMENT & PLAN:   Assessment:  1.  Left facial soft tissue mass: - Left facial mass for the last 4-5 months, gradually increasing.  No hearing loss/vision changes. - No fevers/night sweats.  57 pound weight loss in the last 2 years, has been on Mounjaro for the last 1 and half year. - Ultrasound (11/30/2023): Complex 3.6 x 1.1 x 2.9 cm cystic structure in front of the left ear. - CT head (12/27/2022): Spiculated soft tissue mass around the left zygomatic arch measuring 2.2 cm in thickness and 4.3 cm in length.  Multifocal lobulated and infiltrative appearing soft tissue along the deep posterior left scalp convexity might be in part abnormal left level 5 lymph nodes. - No new pains reported.  Has back pain from disc problems.  2. 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 Quit smoking 10-12 years ago of 0.5 to 0.75 ppd since age 44. Asbestos exposure in the [employer]. AFFF exposure. -Mother died of lymphoma. No other family history of cancer. No family history of blood clots.  Plan:  1.  Left preauricular soft tissue mass: - Recommend PET CT scan for further evaluation. - Recommend ENT evaluation for possible biopsy.  He has an appointment on 01/25/2024. - Will check CBC, CMP, LDH and uric acid levels today. - RTC after the PET scan.   Orders Placed This Encounter  Procedures   NM PET Image Initial (PI) Whole Body    Standing Status:   Future    Expected Date:   01/16/2024    Expiration Date:   01/14/2025    If indicated for the ordered procedure, I authorize the administration of a  radiopharmaceutical per Radiology protocol:   Yes    Preferred imaging location?:   [facility]    Release to patient:   Immediate   CBC with Differential    Standing Status:   Future    Number of Occurrences:   1    Expected Date:   01/15/2024    Expiration Date:   01/14/2025   Comprehensive metabolic panel    Standing Status:   Future    Number of Occurrences:   1    Expected Date:   01/15/2024    Expiration Date:   01/14/2025   Lactate dehydrogenase    Standing Status:   Future    Number of Occurrences:   1    Expected Date:   01/15/2024    Expiration Date:   01/14/2025   Uric acid    Standing Status:   Future    Number of Occurrences:   1    Expected Date:   01/15/2024    Expiration Date:   01/14/2025      Shawn Fleming,acting as a scribe for Doreatha Massed, MD.,have documented all relevant documentation on the behalf of Doreatha Massed, MD,as directed by  Doreatha Massed, MD while in the presence of Doreatha Massed, MD.   I, Doreatha Massed MD, have reviewed the above documentation for accuracy and completeness, and I agree with the above.   Doreatha Massed, MD   1/28/20255:33 PM  CHIEF COMPLAINT/PURPOSE OF  CONSULT:   Diagnosis: Left preauricular mass   Cancer Staging  No matching staging information was found for the patient.    Prior Therapy: None  Current Therapy: Under workup   HISTORY OF PRESENT ILLNESS:   Oncology History   No history exists.      Shawn Fleming is a 68 y.o. male presenting to clinic today for evaluation of follicular cyst of the skin and subcutaneous tissue at the request of Micael Hampshire, FNP.  Patient had what was originally thought to be an ingrown hair in the left temple area. An US of the area was done that found a left periauricular complex cystic structure. CT head done on 12/28/23 found: multifocal abnormal lobulated and infiltrative appearing soft tissue visible in the left scalp and face with the dominant  soft tissue mass surrounding the left zygomatic arch and partially inseparable from muscles of mastication up to 4.3 cm; nearby multiple soft tissue nodules in the left parotid space are partially visible, the largest at least 1.8 cm; and multifocal lobulated and somewhat infiltrative appearing soft tissue along the deep posterior left scalp convexity.   Today, he states that he is doing well overall. His appetite level is at 70%. His energy level is at 80%.  He was unaware of the reason for his referral. He notes bumps on the side of the face for the past several months that have gradually grown in size. There is pain on the tip of a laceration on one of his facial bumps. There is occasional shooting pain to the left periauricular    He denies any vision issues in the left eye, other than cataracts, or hearing issues in the eft ear. He has migraines. He denies any nasal drainage or loss of smell. He has intentionally lost 54 pounds for the past 2 years and has been on [facility] for the past 1.5 years.   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 He notes difficulty standing up when sitting for long periods of time, though he generally has no difficulty walking. He has a bone spur in the left foot.   He denies any history of CVA's, TIA's, MI's, or Afib. He may have had a DVT in the past in the right lower extremity, though it is not confirmed, with Dr. Cecelia Byars having done the workup. He has been on Eliquis for about 6-7 years.   He has been called by ENT and has an appointment with them on 01/25/24.    PAST MEDICAL HISTORY:   Past Medical History: Past Medical History:  Diagnosis Date   AKI (acute kidney injury) (HCC) 07/29/2020   Arthritis    Diabetes (HCC)    GERD (gastroesophageal reflux disease)    History of kidney stones    Hypercholesteremia    Hypertension    Migraine    Sleep apnea     Surgical History: Past Surgical History:  Procedure Laterality Date    ADENOIDECTOMY     APPENDECTOMY     CENTRAL VENOUS CATHETER INSERTION Left 04/20/2013   Procedure: INSERTION CENTRAL LINE LEFT SUBCLAVIAN;  Surgeon: Fabio Bering, MD;  Location: [facility];  Service: General;  Laterality: Left;   INCISION AND DRAINAGE PERIRECTAL ABSCESS N/A 04/20/2013   Procedure: SHARP SURGICAL DEBRIDEMENT PERINEAL INFECTION;  Surgeon: Fabio Bering, MD;  Location: [facility];  Service: General;  Laterality: N/A;   KNEE SURGERY Right    arthroscopic   SHOULDER SURGERY Bilateral    removal  of bone.   TONSILLECTOMY      Social History: Social History   Socioeconomic History   Marital status: Divorced    Spouse name: Not on file   Number of children: 6   Years of education: Not on file   Highest education level: Not on file  Occupational History   Occupation: Engineer, materials  Tobacco Use   Smoking status: Former    Current packs/day: 0.50    Average packs/day: 0.5 packs/day for 23.0 years (11.5 ttl pk-yrs)    Types: Cigarettes   Smokeless tobacco: Never  Vaping Use   Vaping status: Former  Substance and Sexual Activity   Alcohol use: Yes    Comment: rare   Drug use: No   Sexual activity: Not Currently    Birth control/protection: None  Other Topics Concern   Not on file  Social History Narrative   Not on file   Social Drivers of Health   Financial Resource Strain: Not on file  Food Insecurity: Not on file  Transportation Needs: Not on file  Physical Activity: Not on file  Stress: Not on file  Social Connections: Not on file  Intimate Partner Violence: Not on file    Family History: Family History  Problem Relation Age of Onset   Cancer Mother    Dementia Father    Colon cancer Neg Hx    Rectal cancer Neg Hx    Stomach cancer Neg Hx    Esophageal cancer Neg Hx     Current Medications:  Current Outpatient Medications:    alfuzosin (UROXATRAL) 10 MG 24 hr tablet, Take 10 mg by mouth daily., Disp: , Rfl:    ALPRAZolam (XANAX) 0.5 MG tablet,  Take by mouth., Disp: , Rfl:    apixaban (ELIQUIS) 5 MG TABS tablet, Take 1 tablet (5 mg total) by mouth 2 (two) times daily. Please start around August 31, 2020 after you complete the initial starter pack, Disp: 60 tablet, Rfl: 3   Cholecalciferol (VITAMIN D3) 5000 units CAPS, Take 2 capsules by mouth daily. , Disp: , Rfl:    dexamethasone (DECADRON) 1 MG tablet, Take 1 tablet (1 mg total) by mouth daily with breakfast., Disp: 15 tablet, Rfl: 0   furosemide (LASIX) 40 MG tablet, Take 40 mg by mouth daily., Disp: , Rfl:    gabapentin (NEURONTIN) 600 MG tablet, Take 600 mg by mouth 3 (three) times daily., Disp: , Rfl:    hydrocortisone (CORTEF) 10 MG tablet, Take 10 mg  daily at 8 AM and 10 mg daily at noon, Disp: 200 tablet, Rfl: 1   insulin glargine (LANTUS) 100 UNIT/ML Solostar Pen, Inject 60 Units into the skin at bedtime., Disp: , Rfl:    Multiple Vitamins-Minerals (MULTIVITAMIN PO), Take 1 tablet by mouth daily., Disp: , Rfl:    Oxycodone HCl 10 MG TABS, Take 10 mg by mouth 4 (four) times daily., Disp: , Rfl:    pantoprazole (PROTONIX) 40 MG tablet, Take 40 mg by mouth daily., Disp: , Rfl:    potassium chloride (KLOR-CON) 10 MEQ tablet, Take 10 mEq by mouth daily., Disp: , Rfl:    rosuvastatin (CRESTOR) 10 MG tablet, Take 10 mg by mouth daily., Disp: , Rfl:    Sodium Sulfate-Mag Sulfate-KCl (SUTAB) [phone] MG TABS, , Disp: , Rfl:    SUMAtriptan (IMITREX) 100 MG tablet, Take 100 mg by mouth as directed., Disp: , Rfl:    tirzepatide (MOUNJARO) 12.5 MG/0.5ML Pen, Inject 12.5 mg into the skin once a  week., Disp: 6 mL, Rfl: 0   furosemide (LASIX) 40 MG tablet, Take 1 tablet by mouth daily., Disp: , Rfl:    Allergies: Allergies  Allergen Reactions   Prednisone     REVIEW OF SYSTEMS:   Review of Systems  Constitutional:  Negative for chills, fatigue and fever.  HENT:   Negative for lump/mass, mouth sores, nosebleeds, sore throat and trouble swallowing.   Eyes:  Negative for eye  problems.  Respiratory:  Negative for cough and shortness of breath.   Cardiovascular:  Negative for chest pain, leg swelling and palpitations.  Gastrointestinal:  Negative for abdominal pain, constipation, diarrhea, nausea and vomiting.  Genitourinary:  Negative for bladder incontinence, difficulty urinating, dysuria, frequency, hematuria and nocturia.   Musculoskeletal:  Positive for back pain. Negative for arthralgias, flank pain, myalgias and neck pain.       +bilateral leg pain, 6/10 severity  Skin:  Negative for itching and rash (6/10 severity).  Neurological:  Positive for headaches and numbness (and tingling in legs). Negative for dizziness.  Hematological:  Does not bruise/bleed easily.  Psychiatric/Behavioral:  Positive for depression and sleep disturbance. Negative for suicidal ideas. The patient is not nervous/anxious.   All other systems reviewed and are negative.    VITALS:   Blood pressure 123/88, pulse 97, temperature (!) 97.1 F (36.2 C), temperature source Oral, resp. rate 18, height 5\' 4"  (1.626 m), weight 220 lb 14.4 oz (100.2 kg), SpO2 95%.  Wt Readings from Last 3 Encounters:  01/15/24 220 lb 14.4 oz (100.2 kg)  09/14/23 229 lb 8 oz (104.1 kg)  07/27/23 231 lb 6.4 oz (105 kg)    Body mass index is 37.92 kg/m.  Performance status (ECOG): 0 - Asymptomatic  PHYSICAL EXAM:   Physical Exam Vitals and nursing note reviewed. Exam conducted with a chaperone present.  Constitutional:      Appearance: Normal appearance.  HENT:     Head:     Salivary Glands: Left salivary gland is diffusely enlarged.     Comments: +left temporal mass measuring 4 x 4 cm  +left parotid mass +multiple subcutaneous nodules in left parietal scalp Cardiovascular:     Rate and Rhythm: Normal rate and regular rhythm.     Pulses: Normal pulses.     Heart sounds: Normal heart sounds.  Pulmonary:     Effort: Pulmonary effort is normal.     Breath sounds: Normal breath sounds.   Abdominal:     Palpations: Abdomen is soft. There is no hepatomegaly, splenomegaly or mass.     Tenderness: There is no abdominal tenderness.  Musculoskeletal:     Right lower leg: No edema.     Left lower leg: No edema.  Lymphadenopathy:     Head:     Right side of head: No submental, submandibular, tonsillar, preauricular, posterior auricular or occipital adenopathy.     Left side of head: No submental, submandibular, tonsillar, preauricular, posterior auricular or occipital adenopathy.     Cervical: No cervical adenopathy.     Right cervical: No superficial, deep or posterior cervical adenopathy.    Left cervical: No superficial, deep or posterior cervical adenopathy.     Upper Body:     Right upper body: No supraclavicular or axillary adenopathy.     Left upper body: No supraclavicular or axillary adenopathy.     Lower Body: No right inguinal adenopathy. No left inguinal adenopathy.  Neurological:     General: No focal deficit present.  Mental Status: He is alert and oriented to person, place, and time.  Psychiatric:        Mood and Affect: Mood normal.        Behavior: Behavior normal.     LABS:   CBC    Component Value Date/Time   WBC 10.7 (H) 01/15/2024 1413   RBC 4.99 01/15/2024 1413   HGB 14.8 01/15/2024 1413   HCT 44.3 01/15/2024 1413   PLT 261 01/15/2024 1413   MCV 88.8 01/15/2024 1413   MCH 29.7 01/15/2024 1413   MCHC 33.4 01/15/2024 1413   RDW 14.0 01/15/2024 1413   LYMPHSABS 1.7 01/15/2024 1413   MONOABS 0.7 01/15/2024 1413   EOSABS 0.3 01/15/2024 1413   BASOSABS 0.0 01/15/2024 1413    CMP    Component Value Date/Time   NA 137 01/15/2024 1413   NA 142 06/04/2023 0812   K 4.5 01/15/2024 1413   CL 99 01/15/2024 1413   CO2 28 01/15/2024 1413   GLUCOSE 172 (H) 01/15/2024 1413   BUN 18 01/15/2024 1413   BUN 16 06/04/2023 0812   CREATININE 1.35 (H) 01/15/2024 1413   CALCIUM 9.9 01/15/2024 1413   PROT 7.8 01/15/2024 1413   PROT 6.8 06/04/2023  0812   ALBUMIN 4.4 01/15/2024 1413   ALBUMIN 4.3 06/04/2023 0812   AST 18 01/15/2024 1413   ALT 18 01/15/2024 1413   ALKPHOS 69 01/15/2024 1413   BILITOT 0.6 01/15/2024 1413   BILITOT 0.4 06/04/2023 0812   GFRNONAA 57 (L) 01/15/2024 1413   GFRAA 59 (L) 08/01/2020 0758     No results found for: "CEA1", "CEA" / No results found for: "CEA1", "CEA" No results found for: "PSA1" No results found for: "NWG956" No results found for: "CAN125"  No results found for: "TOTALPROTELP", "ALBUMINELP", "A1GS", "A2GS", "BETS", "BETA2SER", "GAMS", "MSPIKE", "SPEI" No results found for: "TIBC", "FERRITIN", "IRONPCTSAT" Lab Results  Component Value Date   LDH 116 01/15/2024     STUDIES:   CT HEAD WO CONTRAST ( ) Addendum Date: 01/09/2024 ADDENDUM REPORT: 01/09/2024 08:26 ADDENDUM: Study discussed by telephone with NP MICHELLE HOWELL on 01/09/2024 at 0813 hours. Electronically Signed   By: Odessa Fleming M.D.   On: 01/09/2024 08:26   Result Date: 01/09/2024 CLINICAL DATA:  68 year old male with left periauricular complex cystic structure on ultrasound last month. EXAM: CT HEAD WITHOUT CONTRAST TECHNIQUE: Contiguous axial images were obtained from the base of the skull through the vertex without intravenous contrast. RADIATION DOSE REDUCTION: This exam was performed according to the departmental dose-optimization program which includes automated exposure control, adjustment of the mA and/or kV according to patient size and/or use of iterative reconstruction technique. COMPARISON:  Head CT 11/08/2013.  Head ultrasound 11/30/2023. FINDINGS: Brain: Cerebral volume is within normal limits for age. No ventriculomegaly. No acute intracranial hemorrhage identified. No midline shift, mass effect, or evidence of intracranial mass lesion. No cerebral edema identified. No cortically based acute infarct identified. 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 Minimal for age scattered cerebral white matter hypodensity. Vascular: No suspicious intracranial vascular hyperdensity. Skull: The left-side go Matic arch is surrounded by abnormal soft tissue but appears to remain intact. No obvious lytic or sclerotic bone lesion there. And no other suspicious osseous lesion identified in the calvarium (small right frontal bone lucent area series 3, image 49 unchanged since 2014 and therefore benign). Sinuses/Orbits: Visualized paranasal sinuses and mastoids are  stable and well aerated. Tympanic cavities and mastoids appear clear. Other: Bilateral pinna appear symmetric and within normal limits. Multifocal asymmetric face and scalp soft tissue findings including partially visible multiple soft tissue nodules in the left parotid space, individually up to 18 mm seen on series 3, image 1. However, larger and suspicious spiculated soft tissue is surrounding the left zygomatic arch measuring up to 22 mm in thickness, about 43 mm in length (series 2, images 5 and 7) and probably corresponds to the ultrasound abnormality. See also coronal image 30. The masslike soft tissue there is inseparable from some of the muscles of mastication including the upper masseter. But furthermore there is lobulated, nodular and asymmetric soft tissue tracking along the left posterior scalp including series 3 images 33 and 49. Some of this resembles abnormal left level 5 lymph nodes. Contralateral visible right face and scalp soft tissues are within normal limits. No orbits soft tissue abnormality is identified. IMPRESSION: 1. Multifocal abnormal lobulated and infiltrative appearing soft tissue visible in the left scalp and face, new since 2014: - dominant soft tissue mass surrounding the left zygomatic arch and partially inseparable from muscles of mastication is up to 4.3 cm long axis. Underlying zygomatic bone unremarkable by CT. - nearby multiple soft  tissue nodules in the left parotid space are partially visible, the largest is at least 1.8 cm. - multifocal lobulated and somewhat infiltrative appearing soft tissue along the deep posterior left scalp convexity, might be in part abnormal left level V lymph nodes. The constellation is suspicious for Malignancy and although the visible right face and scalp seem spared these might be Metastases. No skull lesion. Lymphoma is a also consideration. Primary Sarcoma felt less likely. Follow-up Head MRI without and with contrast and CT Chest, Abdomen, and Pelvis, or alternatively whole body PET-CT may be the next best step in imaging evaluation. 2. No cerebral edema or acute intracranial abnormality by CT. Chronic left cerebellar infarct. Electronically Signed: By: Odessa Fleming M.D. On: 01/09/2024 07:39

## 2024-01-15 ENCOUNTER — Other Ambulatory Visit (HOSPITAL_COMMUNITY): Payer: Self-pay | Admitting: Nurse Practitioner

## 2024-01-15 ENCOUNTER — Inpatient Hospital Stay: Payer: Medicare Other

## 2024-01-15 ENCOUNTER — Other Ambulatory Visit (HOSPITAL_COMMUNITY): Payer: Self-pay | Admitting: Urology

## 2024-01-15 ENCOUNTER — Inpatient Hospital Stay: Payer: Medicare Other | Attending: Hematology | Admitting: Hematology

## 2024-01-15 VITALS — BP 123/88 | HR 97 | Temp 97.1°F | Resp 18 | Ht 64.0 in | Wt 220.9 lb

## 2024-01-15 DIAGNOSIS — R220 Localized swelling, mass and lump, head: Secondary | ICD-10-CM

## 2024-01-15 DIAGNOSIS — R591 Generalized enlarged lymph nodes: Secondary | ICD-10-CM

## 2024-01-15 DIAGNOSIS — C61 Malignant neoplasm of prostate: Secondary | ICD-10-CM

## 2024-01-15 DIAGNOSIS — R930 Abnormal findings on diagnostic imaging of skull and head, not elsewhere classified: Secondary | ICD-10-CM

## 2024-01-15 LAB — COMPREHENSIVE METABOLIC PANEL
ALT: 18 U/L (ref 0–44)
AST: 18 U/L (ref 15–41)
Albumin: 4.4 g/dL (ref 3.5–5.0)
Alkaline Phosphatase: 69 U/L (ref 38–126)
Anion gap: 10 (ref 5–15)
BUN: 18 mg/dL (ref 8–23)
CO2: 28 mmol/L (ref 22–32)
Calcium: 9.9 mg/dL (ref 8.9–10.3)
Chloride: 99 mmol/L (ref 98–111)
Creatinine, Ser: 1.35 mg/dL — ABNORMAL HIGH (ref 0.61–1.24)
GFR, Estimated: 57 mL/min — ABNORMAL LOW (ref >60)
Glucose, Bld: 172 mg/dL — ABNORMAL HIGH (ref 70–99)
Potassium: 4.5 mmol/L (ref 3.5–5.1)
Sodium: 137 mmol/L (ref 135–145)
Total Bilirubin: 0.6 mg/dL (ref 0.0–1.2)
Total Protein: 7.8 g/dL (ref 6.5–8.1)

## 2024-01-15 LAB — CBC WITH DIFFERENTIAL/PLATELET
Abs Immature Granulocytes: 0.04 10*3/uL (ref 0.00–0.07)
Basophils Absolute: 0 10*3/uL (ref 0.0–0.1)
Basophils Relative: 0 %
Eosinophils Absolute: 0.3 10*3/uL (ref 0.0–0.5)
Eosinophils Relative: 2 %
HCT: 44.3 % (ref 39.0–52.0)
Hemoglobin: 14.8 g/dL (ref 13.0–17.0)
Immature Granulocytes: 0 %
Lymphocytes Relative: 16 %
Lymphs Abs: 1.7 10*3/uL (ref 0.7–4.0)
MCH: 29.7 pg (ref 26.0–34.0)
MCHC: 33.4 g/dL (ref 30.0–36.0)
MCV: 88.8 fL (ref 80.0–100.0)
Monocytes Absolute: 0.7 10*3/uL (ref 0.1–1.0)
Monocytes Relative: 6 %
Neutro Abs: 8 10*3/uL — ABNORMAL HIGH (ref 1.7–7.7)
Neutrophils Relative %: 76 %
Platelets: 261 10*3/uL (ref 150–400)
RBC: 4.99 MIL/uL (ref 4.22–5.81)
RDW: 14 % (ref 11.5–15.5)
WBC: 10.7 10*3/uL — ABNORMAL HIGH (ref 4.0–10.5)
nRBC: 0 % (ref 0.0–0.2)

## 2024-01-15 LAB — LACTATE DEHYDROGENASE: LDH: 116 U/L (ref 98–192)

## 2024-01-15 LAB — URIC ACID: Uric Acid, Serum: 7 mg/dL (ref 3.7–8.6)

## 2024-01-15 NOTE — Patient Instructions (Signed)
[facility]  Discharge Instructions  You were seen and examined today by Dr. Ellin Saba. Dr. Ellin Saba is a medical oncologist, meaning that he specializes in the treatment of cancer diagnoses. Dr. Ellin Saba discussed your past medical history, family history of cancers, and the events that led to you being here today.  You were referred to Dr. Ellin Saba due to an abnormal CT scan for enlarged nodules present. As they should not be there, Dr. Ellin Saba has recommended ruling out any malignancy with a PET scan and additional labs today for further evaluation.  Follow-up as scheduled.  Thank you for choosing [facility] to provide your oncology and hematology care.   To afford each patient quality time with our provider, please arrive at least 15 minutes before your scheduled appointment time. You may need to reschedule your appointment if you arrive late (10 or more minutes). Arriving late affects you and other patients whose appointments are after yours.  Also, if you miss three or more appointments without notifying the office, you may be dismissed from the clinic at the provider's discretion.    Again, thank you for choosing [facility].  Our hope is that these requests will decrease the amount of time that you wait before being seen by our physicians.   If you have a lab appointment with the Cancer Center - please note that after April 8th, all labs will be drawn in the cancer center.  You do not have to check in or register with the main entrance as you have in the past but will complete your check-in at the cancer center.            _____________________________________________________________  Should you have questions after your visit to [facility], please contact our office at [phone] and follow the prompts.  Our office hours are 8:00 a.m. to 4:30 p.m. Monday - Thursday and 8:00 a.m. to 2:30 p.m.  Friday.  Please note that voicemails left after 4:00 p.m. may not be returned until the following business day.  We are closed weekends and all major holidays.  You do have access to a nurse 24-7, just call the main number to the clinic [phone] and do not press any options, hold on the line and a nurse will answer the phone.    For prescription refill requests, have your pharmacy contact our office and allow 72 hours.    Masks are no longer required in the cancer centers. If you would like for your care team to wear a mask while they are taking care of you, please let them know. You may have one support person who is at least 69 years old accompany you for your appointments.

## 2024-01-16 ENCOUNTER — Other Ambulatory Visit: Payer: Self-pay | Admitting: "Endocrinology

## 2024-01-16 DIAGNOSIS — E2740 Unspecified adrenocortical insufficiency: Secondary | ICD-10-CM

## 2024-01-17 ENCOUNTER — Other Ambulatory Visit (HOSPITAL_COMMUNITY): Payer: Medicare Other

## 2024-01-23 ENCOUNTER — Encounter (HOSPITAL_COMMUNITY): Payer: Medicare Other

## 2024-01-23 ENCOUNTER — Encounter (HOSPITAL_COMMUNITY)
Admission: RE | Admit: 2024-01-23 | Discharge: 2024-01-23 | Disposition: A | Discharge status: Home / Self Care | Payer: Medicare Other | Source: Ambulatory Visit | Attending: Hematology | Admitting: Hematology

## 2024-01-23 DIAGNOSIS — R930 Abnormal findings on diagnostic imaging of skull and head, not elsewhere classified: Secondary | ICD-10-CM | POA: Insufficient documentation

## 2024-01-23 DIAGNOSIS — R591 Generalized enlarged lymph nodes: Secondary | ICD-10-CM | POA: Diagnosis present

## 2024-01-23 LAB — COMPREHENSIVE METABOLIC PANEL
ALT: 16 [IU]/L (ref 0–44)
AST: 16 [IU]/L (ref 0–40)
Albumin: 4.5 g/dL (ref 3.9–4.9)
Alkaline Phosphatase: 86 [IU]/L (ref 44–121)
BUN/Creatinine Ratio: 11 (ref 10–24)
BUN: 17 mg/dL (ref 8–27)
Bilirubin Total: 0.3 mg/dL (ref 0.0–1.2)
CO2: 29 mmol/L (ref 20–29)
Calcium: 10 mg/dL (ref 8.6–10.2)
Chloride: 100 mmol/L (ref 96–106)
Creatinine, Ser: 1.52 mg/dL — ABNORMAL HIGH (ref 0.76–1.27)
Globulin, Total: 2.5 g/dL (ref 1.5–4.5)
Glucose: 100 mg/dL — ABNORMAL HIGH (ref 70–99)
Potassium: 4.7 mmol/L (ref 3.5–5.2)
Sodium: 143 mmol/L (ref 134–144)
Total Protein: 7 g/dL (ref 6.0–8.5)
eGFR: 50 mL/min/{1.73_m2} — ABNORMAL LOW (ref >59)

## 2024-01-23 LAB — T4, FREE: Free T4: 1.44 ng/dL (ref 0.82–1.77)

## 2024-01-23 LAB — TSH: TSH: 2.13 u[IU]/mL (ref 0.450–4.500)

## 2024-01-23 LAB — GLUCOSE, CAPILLARY: Glucose-Capillary: 115 mg/dL — ABNORMAL HIGH (ref 70–99)

## 2024-01-23 MED ORDER — FLUDEOXYGLUCOSE F - 18 (FDG) INJECTION
11.0000 | Freq: Once | INTRAVENOUS | Status: AC
  Administered 2024-01-23: 10.82 via INTRAVENOUS

## 2024-01-25 ENCOUNTER — Other Ambulatory Visit (HOSPITAL_COMMUNITY): Payer: Self-pay | Admitting: Otolaryngology

## 2024-01-25 ENCOUNTER — Encounter (HOSPITAL_COMMUNITY): Payer: Self-pay | Admitting: Otolaryngology

## 2024-01-25 DIAGNOSIS — R599 Enlarged lymph nodes, unspecified: Secondary | ICD-10-CM

## 2024-01-25 DIAGNOSIS — C07 Malignant neoplasm of parotid gland: Secondary | ICD-10-CM

## 2024-01-25 DIAGNOSIS — Z720 Tobacco use: Secondary | ICD-10-CM

## 2024-01-25 NOTE — Progress Notes (Signed)
 Shawn Sieving, MD  Shawn Fleming PROCEDURE / BIOPSY REVIEW Date: 01/25/24  Requested Biopsy site: L neck X2 Reason for request: per [facility] secure chat left temporal mass AND largest level 2 lymph node Imaging review: Best seen on PET 01/23/24  Decision: Approved Imaging modality to perform: Ultrasound Schedule with: Moderate Sedation Schedule for: Any VIR  Additional comments: @Schedulers . Note 2 biopsy sites, 2 orders, thx  Please contact me with questions, concerns, or if issue pertaining to this request arise.  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

## 2024-01-29 NOTE — Progress Notes (Incomplete)
[facility] [address], [place] [address]    Clinic Day:  01/29/2024  Referring physician: Benita Stabile, MD  Patient Care Team: Benita Stabile, MD as PCP - General (Internal Medicine) Doreatha Massed, MD as Medical Oncologist (Medical Oncology) Therese Sarah, RN as Oncology Nurse Navigator (Medical Oncology)   ASSESSMENT & PLAN:   Assessment: 1.  Left facial soft tissue mass: - Left facial mass for the last 4-5 months, gradually increasing.  No hearing loss/vision changes. - No fevers/night sweats.  57 pound weight loss in the last 2 years, has been on Mounjaro for the last 1 and half year. - Ultrasound (11/30/2023): Complex 3.6 x 1.1 x 2.9 cm cystic structure in front of the left ear. - CT head (12/27/2022): Spiculated soft tissue mass around the left zygomatic arch measuring 2.2 cm in thickness and 4.3 cm in length.  Multifocal lobulated and infiltrative appearing soft tissue along the deep posterior left scalp convexity might be in part abnormal left level 5 lymph nodes. - No new pains reported.  Has back pain from disc problems.   2. 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 Quit smoking 10-12 years ago of 0.5 to 0.75 ppd since age 39. Asbestos exposure in the [employer]. AFFF exposure. -Mother died of lymphoma. No other family history of cancer. No family history of blood clots.    Plan: 1.  Left preauricular soft tissue mass: - Recommend PET CT scan for further evaluation. - Recommend ENT evaluation for possible biopsy.  He has an appointment on 01/25/2024. - Will check CBC, CMP, LDH and uric acid levels today. - RTC after the PET scan.    No orders of the defined types were placed in this encounter.     I,Katie Daubenspeck,acting as a Neurosurgeon for Doreatha Massed, MD.,have documented all relevant documentation on the behalf of Doreatha Massed, MD,as directed by  Doreatha Massed,  MD while in the presence of Doreatha Massed, MD.   ***  Mickie Bail   2/11/20254:05 PM  CHIEF COMPLAINT:   Diagnosis: Left preauricular mass   Cancer Staging  No matching staging information was found for the patient.    Prior Therapy: none  Current Therapy:  ***   HISTORY OF PRESENT ILLNESS:   Oncology History   No history exists.     INTERVAL HISTORY:   Shawn Fleming is a 68 y.o. male presenting to clinic today for follow up of Left preauricular mass. He was last seen by me on 01/15/24 in consultation.  Since his last visit, he underwent staging PET scan on 01/23/24 showing: hypermetabolic enlargement of left muscle of mastication just beneath zygomatic arch; hypermetabolic left level II, level III, and posterior triangle lymph nodes; less well-defined hypermetabolic tissue within right parotid gland; no evidence of metastatic disease in chest, abdomen, or pelvis; indeterminate moderately hypermetabolic inguinal nodes with normal morphology, favor reactive.  He also met with Dr. Ernestene Kiel in ENT and is scheduled for biopsy of the mass on 02/13/24.  Today, he states that he is doing well overall. His appetite level is at ***%. His energy level is at ***%.  PAST MEDICAL HISTORY:   Past Medical History: Past Medical History:  Diagnosis Date   AKI (acute kidney injury) (HCC) 07/29/2020   Arthritis    Diabetes (HCC)    GERD (gastroesophageal reflux disease)    History of kidney stones    Hypercholesteremia    Hypertension  Migraine    Sleep apnea     Surgical History: Past Surgical History:  Procedure Laterality Date   ADENOIDECTOMY     APPENDECTOMY     CENTRAL VENOUS CATHETER INSERTION Left 04/20/2013   Procedure: INSERTION CENTRAL LINE LEFT SUBCLAVIAN;  Surgeon: Fabio Bering, MD;  Location: [facility];  Service: General;  Laterality: Left;   INCISION AND DRAINAGE PERIRECTAL ABSCESS N/A 04/20/2013   Procedure: SHARP SURGICAL DEBRIDEMENT PERINEAL INFECTION;  Surgeon:  Fabio Bering, MD;  Location: [facility];  Service: General;  Laterality: N/A;   KNEE SURGERY Right    arthroscopic   SHOULDER SURGERY Bilateral    removal of bone.   TONSILLECTOMY      Social History: Social History   Socioeconomic History   Marital status: Divorced    Spouse name: Not on file   Number of children: 6   Years of education: Not on file   Highest education level: Not on file  Occupational History   Occupation: Engineer, materials  Tobacco Use   Smoking status: Former    Current packs/day: 0.50    Average packs/day: 0.5 packs/day for 23.0 years (11.5 ttl pk-yrs)    Types: Cigarettes   Smokeless tobacco: Never  Vaping Use   Vaping status: Former  Substance and Sexual Activity   Alcohol use: Yes    Comment: rare   Drug use: No   Sexual activity: Not Currently    Birth control/protection: None  Other Topics Concern   Not on file  Social History Narrative   Not on file   Social Drivers of Health   Financial Resource Strain: Not on file  Food Insecurity: Not on file  Transportation Needs: Not on file  Physical Activity: Not on file  Stress: Not on file  Social Connections: Not on file  Intimate Partner Violence: Not on file    Family History: Family History  Problem Relation Age of Onset   Cancer Mother    Dementia Father    Colon cancer Neg Hx    Rectal cancer Neg Hx    Stomach cancer Neg Hx    Esophageal cancer Neg Hx     Current Medications:  Current Outpatient Medications:    alfuzosin (UROXATRAL) 10 MG 24 hr tablet, Take 10 mg by mouth daily., Disp: , Rfl:    ALPRAZolam (XANAX) 0.5 MG tablet, Take by mouth., Disp: , Rfl:    apixaban (ELIQUIS) 5 MG TABS tablet, Take 1 tablet (5 mg total) by mouth 2 (two) times daily. Please start around August 31, 2020 after you complete the initial starter pack, Disp: 60 tablet, Rfl: 3   Cholecalciferol (VITAMIN D3) 5000 units CAPS, Take 2 capsules by mouth daily. , Disp: , Rfl:    dexamethasone  (DECADRON) 1 MG tablet, Take 1 tablet (1 mg total) by mouth daily with breakfast., Disp: 15 tablet, Rfl: 0   furosemide (LASIX) 40 MG tablet, Take 40 mg by mouth daily., Disp: , Rfl:    furosemide (LASIX) 40 MG tablet, Take 1 tablet by mouth daily., Disp: , Rfl:    gabapentin (NEURONTIN) 600 MG tablet, Take 600 mg by mouth 3 (three) times daily., Disp: , Rfl:    hydrocortisone (CORTEF) 10 MG tablet, TAKE 1 TABLET DAILY AT 8 A.M. AND 1 TABLET DAILY AT NOON, Disp: 180 tablet, Rfl: 1   insulin glargine (LANTUS) 100 UNIT/ML Solostar Pen, Inject 60 Units into the skin at bedtime., Disp: , Rfl:    Multiple Vitamins-Minerals (MULTIVITAMIN PO),  Take 1 tablet by mouth daily., Disp: , Rfl:    Oxycodone HCl 10 MG TABS, Take 10 mg by mouth 4 (four) times daily., Disp: , Rfl:    pantoprazole (PROTONIX) 40 MG tablet, Take 40 mg by mouth daily., Disp: , Rfl:    potassium chloride (KLOR-CON) 10 MEQ tablet, Take 10 mEq by mouth daily., Disp: , Rfl:    rosuvastatin (CRESTOR) 10 MG tablet, Take 10 mg by mouth daily., Disp: , Rfl:    Sodium Sulfate-Mag Sulfate-KCl (SUTAB) [phone] MG TABS, , Disp: , Rfl:    SUMAtriptan (IMITREX) 100 MG tablet, Take 100 mg by mouth as directed., Disp: , Rfl:    tirzepatide (MOUNJARO) 12.5 MG/0.5ML Pen, Inject 12.5 mg into the skin once a week., Disp: 6 mL, Rfl: 0   Allergies: Allergies  Allergen Reactions   Prednisone     REVIEW OF SYSTEMS:   Review of Systems  Constitutional:  Negative for chills, fatigue and fever.  HENT:   Negative for lump/mass, mouth sores, nosebleeds, sore throat and trouble swallowing.   Eyes:  Negative for eye problems.  Respiratory:  Negative for cough and shortness of breath.   Cardiovascular:  Negative for chest pain, leg swelling and palpitations.  Gastrointestinal:  Negative for abdominal pain, constipation, diarrhea, nausea and vomiting.  Genitourinary:  Negative for bladder incontinence, difficulty urinating, dysuria, frequency, hematuria  and nocturia.   Musculoskeletal:  Negative for arthralgias, back pain, flank pain, myalgias and neck pain.  Skin:  Negative for itching and rash.  Neurological:  Negative for dizziness, headaches and numbness.  Hematological:  Does not bruise/bleed easily.  Psychiatric/Behavioral:  Negative for depression, sleep disturbance and suicidal ideas. The patient is not nervous/anxious.   All other systems reviewed and are negative.    VITALS:   There were no vitals taken for this visit.  Wt Readings from Last 3 Encounters:  01/15/24 220 lb 14.4 oz (100.2 kg)  09/14/23 229 lb 8 oz (104.1 kg)  07/27/23 231 lb 6.4 oz (105 kg)    There is no height or weight on file to calculate BMI.  Performance status (ECOG): {CHL ONC [id number]  PHYSICAL EXAM:   Physical Exam Vitals and nursing note reviewed. Exam conducted with a chaperone present.  Constitutional:      Appearance: Normal appearance.  Cardiovascular:     Rate and Rhythm: Normal rate and regular rhythm.     Pulses: Normal pulses.     Heart sounds: Normal heart sounds.  Pulmonary:     Effort: Pulmonary effort is normal.     Breath sounds: Normal breath sounds.  Abdominal:     Palpations: Abdomen is soft. There is no hepatomegaly, splenomegaly or mass.     Tenderness: There is no abdominal tenderness.  Musculoskeletal:     Right lower leg: No edema.     Left lower leg: No edema.  Lymphadenopathy:     Cervical: No cervical adenopathy.     Right cervical: No superficial, deep or posterior cervical adenopathy.    Left cervical: No superficial, deep or posterior cervical adenopathy.     Upper Body:     Right upper body: No supraclavicular or axillary adenopathy.     Left upper body: No supraclavicular or axillary adenopathy.  Neurological:     General: No focal deficit present.     Mental Status: He is alert and oriented to person, place, and time.  Psychiatric:        Mood and Affect: Mood normal.  Behavior: Behavior  normal.     LABS:   CBC     Component Value Date/Time   WBC 10.7 (H) 01/15/2024 1413   RBC 4.99 01/15/2024 1413   HGB 14.8 01/15/2024 1413   HCT 44.3 01/15/2024 1413   PLT 261 01/15/2024 1413   MCV 88.8 01/15/2024 1413   MCH 29.7 01/15/2024 1413   MCHC 33.4 01/15/2024 1413   RDW 14.0 01/15/2024 1413   LYMPHSABS 1.7 01/15/2024 1413   MONOABS 0.7 01/15/2024 1413   EOSABS 0.3 01/15/2024 1413   BASOSABS 0.0 01/15/2024 1413    CMP      Component Value Date/Time   NA 143 01/22/2024 1307   K 4.7 01/22/2024 1307   CL 100 01/22/2024 1307   CO2 29 01/22/2024 1307   GLUCOSE 100 (H) 01/22/2024 1307   GLUCOSE 172 (H) 01/15/2024 1413   BUN 17 01/22/2024 1307   CREATININE 1.52 (H) 01/22/2024 1307   CALCIUM 10.0 01/22/2024 1307   PROT 7.0 01/22/2024 1307   ALBUMIN 4.5 01/22/2024 1307   AST 16 01/22/2024 1307   ALT 16 01/22/2024 1307   ALKPHOS 86 01/22/2024 1307   BILITOT 0.3 01/22/2024 1307   GFRNONAA 57 (L) 01/15/2024 1413   GFRAA 59 (L) 08/01/2020 0758     No results found for: "CEA1", "CEA" / No results found for: "CEA1", "CEA" No results found for: "PSA1" No results found for: "WUJ811" No results found for: "CAN125"  No results found for: "TOTALPROTELP", "ALBUMINELP", "A1GS", "A2GS", "BETS", "BETA2SER", "GAMS", "MSPIKE", "SPEI" No results found for: "TIBC", "FERRITIN", "IRONPCTSAT" Lab Results  Component Value Date   LDH 116 01/15/2024     STUDIES:   NM PET Image Initial (PI) Whole Body Result Date: 01/24/2024 CLINICAL DATA:  Initial treatment strategy for lymphadenopathy head neck. EXAM: NUCLEAR MEDICINE PET WHOLE BODY TECHNIQUE: 10.8 mCi F-18 FDG was injected intravenously. Full-ring PET imaging was performed from the head to foot after the radiotracer. CT data was obtained and used for attenuation correction and anatomic localization. Fasting blood glucose: 115 mg/dl COMPARISON:  None Available. FINDINGS: Mediastinal blood pool activity: SUV max 2.9 HEAD/NECK:  Hypermetabolic enlargement of the LEFT temporalis muscle just beneath the zygomatic arch with SUV max equal 14.4 on image 33. asymmetric muscle thickening measures 19 mm on image 33. Additionally there are multiple LEFT level 2 and level 3 hypermetabolic lymph nodes. Example node just inferior to the tail the parotid gland on the LEFT with SUV max equal 11.9 on image 43. This nodule measures 21 mm. More inferiorly level 3 node on the LEFT measuring 10 mm on image 58 SUV max equal 10.6. Additionally, there are posterior triangle nodes on the LEFT measuring 10 mm each with SUV max equal 9.0 (image 52). Less well-defined hypermetabolic tissue within the RIGHT parotid gland measuring 11 mm on image 45) with SUV max equal 6.6. Incidental CT findings: none CHEST: No hypermetabolic mediastinal or hilar nodes. No suspicious pulmonary nodules on the CT scan. Incidental CT findings: none ABDOMEN/PELVIS: No abnormal hypermetabolic activity within the liver, pancreas, adrenal glands, or spleen. No hypermetabolic lymph nodes in the abdomen or pelvis. Moderate hypermetabolic inguinal nodes are normal morphology with low metabolic activity (SUV max equal 5.9). Incidental CT findings: none SKELETON: No focal hypermetabolic activity to suggest skeletal metastasis. Incidental CT findings: none EXTREMITIES: No abnormal hypermetabolic activity in the lower extremities. Incidental CT findings: none IMPRESSION: 1. Hypermetabolic enlargement of the LEFT muscle of mastication just beneath the zygomatic arch. 2. Hypermetabolic LEFT  level II, level III and LEFT posterior triangle lymph nodes . 3. Less well-defined hypermetabolic tissue within the RIGHT parotid gland. 4. No evidence of metastatic disease in the chest abdomen or pelvis. 5. Moderately hypermetabolic inguinal nodes with normal morphology are indeterminate. Favor reactive. Electronically Signed   By: Genevive Bi M.D.   On: 01/24/2024 10:46   MR LUMBAR SPINE WO  CONTRAST Result Date: 01/22/2024 CLINICAL DATA:  Low back pain with bilateral radiculopathy EXAM: MRI LUMBAR SPINE WITHOUT CONTRAST TECHNIQUE: Multiplanar, multisequence MR imaging of the lumbar spine was performed. No intravenous contrast was administered. COMPARISON:  09/10/2020 FINDINGS: Segmentation:  Standard. Alignment:  Trace retrolisthesis at L2-3. Vertebrae: No fracture, evidence of discitis, or bone lesion. Reactive subchondral marrow edema associated with the right L4-5 and L5-S1 facet joints. Conus medullaris and cauda equina: Conus extends to the L2 level. Conus and cauda equina appear normal. Paraspinal and other soft tissues: Colonic diverticulosis. No acute findings. Disc levels: T12-L1: Unremarkable. L1-L2: Minimal disc bulge without foraminal or canal stenosis. Unchanged. L2-L3: Mild broad-based disc bulge and mild bilateral facet arthropathy. No canal stenosis. Mild bilateral foraminal stenosis. Findings have progressed from prior. L3-L4: Annular disc bulge with mild-to-moderate bilateral facet arthropathy. Mild canal stenosis and mild bilateral foraminal stenosis. Findings have progressed from prior. L4-L5: Annular disc bulge with posterior annular fissure. Mild-to-moderate facet arthropathy. No significant canal stenosis. Mild-to-moderate bilateral foraminal stenosis. No significant interval progression. L5-S1: Disc bulge and endplate spurring, eccentric to the right. Severe right-sided facet arthropathy. No canal stenosis. Moderate right and mild left foraminal stenosis. No significant interval progression. IMPRESSION: 1. Multilevel lumbar spondylosis, mildly progressed since 2021. 2. Mild canal stenosis at L3-4. 3. Mild-to-moderate bilateral foraminal stenosis at L4-5. 4. 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 5. Reactive subchondral marrow edema associated with the right L4-5 and L5-S1 facet joints, which may contribute to low back pain. Electronically Signed   By: Duanne Guess D.O.   On: 01/22/2024 11:37

## 2024-01-30 ENCOUNTER — Encounter (HOSPITAL_COMMUNITY): Payer: Self-pay

## 2024-01-30 ENCOUNTER — Emergency Department (HOSPITAL_COMMUNITY): Payer: Medicare Other

## 2024-01-30 ENCOUNTER — Inpatient Hospital Stay (HOSPITAL_COMMUNITY)
Admission: EM | Admit: 2024-01-30 | Discharge: 2024-02-01 | DRG: 682 | Disposition: A | Discharge status: Home / Self Care | Payer: Medicare Other | Attending: Internal Medicine | Admitting: Internal Medicine

## 2024-01-30 ENCOUNTER — Inpatient Hospital Stay: Payer: Medicare Other | Admitting: Hematology

## 2024-01-30 ENCOUNTER — Other Ambulatory Visit: Payer: Self-pay

## 2024-01-30 DIAGNOSIS — E7800 Pure hypercholesterolemia, unspecified: Secondary | ICD-10-CM | POA: Diagnosis present

## 2024-01-30 DIAGNOSIS — E1140 Type 2 diabetes mellitus with diabetic neuropathy, unspecified: Secondary | ICD-10-CM | POA: Diagnosis present

## 2024-01-30 DIAGNOSIS — M4807 Spinal stenosis, lumbosacral region: Secondary | ICD-10-CM | POA: Diagnosis present

## 2024-01-30 DIAGNOSIS — Z87442 Personal history of urinary calculi: Secondary | ICD-10-CM

## 2024-01-30 DIAGNOSIS — Z79899 Other long term (current) drug therapy: Secondary | ICD-10-CM

## 2024-01-30 DIAGNOSIS — I129 Hypertensive chronic kidney disease with stage 1 through stage 4 chronic kidney disease, or unspecified chronic kidney disease: Secondary | ICD-10-CM | POA: Diagnosis present

## 2024-01-30 DIAGNOSIS — Z7952 Long term (current) use of systemic steroids: Secondary | ICD-10-CM

## 2024-01-30 DIAGNOSIS — Z7901 Long term (current) use of anticoagulants: Secondary | ICD-10-CM

## 2024-01-30 DIAGNOSIS — Z79891 Long term (current) use of opiate analgesic: Secondary | ICD-10-CM

## 2024-01-30 DIAGNOSIS — N179 Acute kidney failure, unspecified: Principal | ICD-10-CM | POA: Diagnosis present

## 2024-01-30 DIAGNOSIS — T426X5A Adverse effect of other antiepileptic and sedative-hypnotic drugs, initial encounter: Secondary | ICD-10-CM | POA: Diagnosis present

## 2024-01-30 DIAGNOSIS — R4182 Altered mental status, unspecified: Secondary | ICD-10-CM | POA: Diagnosis not present

## 2024-01-30 DIAGNOSIS — M47816 Spondylosis without myelopathy or radiculopathy, lumbar region: Secondary | ICD-10-CM | POA: Diagnosis present

## 2024-01-30 DIAGNOSIS — G9340 Encephalopathy, unspecified: Principal | ICD-10-CM

## 2024-01-30 DIAGNOSIS — E2740 Unspecified adrenocortical insufficiency: Secondary | ICD-10-CM | POA: Diagnosis not present

## 2024-01-30 DIAGNOSIS — J101 Influenza due to other identified influenza virus with other respiratory manifestations: Secondary | ICD-10-CM

## 2024-01-30 DIAGNOSIS — G8929 Other chronic pain: Secondary | ICD-10-CM | POA: Diagnosis present

## 2024-01-30 DIAGNOSIS — T402X5A Adverse effect of other opioids, initial encounter: Secondary | ICD-10-CM | POA: Diagnosis present

## 2024-01-30 DIAGNOSIS — Z888 Allergy status to other drugs, medicaments and biological substances status: Secondary | ICD-10-CM

## 2024-01-30 DIAGNOSIS — G43909 Migraine, unspecified, not intractable, without status migrainosus: Secondary | ICD-10-CM | POA: Diagnosis present

## 2024-01-30 DIAGNOSIS — G4733 Obstructive sleep apnea (adult) (pediatric): Secondary | ICD-10-CM | POA: Diagnosis present

## 2024-01-30 DIAGNOSIS — R590 Localized enlarged lymph nodes: Secondary | ICD-10-CM | POA: Diagnosis present

## 2024-01-30 DIAGNOSIS — R32 Unspecified urinary incontinence: Secondary | ICD-10-CM | POA: Diagnosis present

## 2024-01-30 DIAGNOSIS — T424X5A Adverse effect of benzodiazepines, initial encounter: Secondary | ICD-10-CM | POA: Diagnosis present

## 2024-01-30 DIAGNOSIS — Z1152 Encounter for screening for COVID-19: Secondary | ICD-10-CM

## 2024-01-30 DIAGNOSIS — M545 Low back pain, unspecified: Secondary | ICD-10-CM | POA: Diagnosis present

## 2024-01-30 DIAGNOSIS — M549 Dorsalgia, unspecified: Secondary | ICD-10-CM | POA: Diagnosis not present

## 2024-01-30 DIAGNOSIS — Z6838 Body mass index (BMI) 38.0-38.9, adult: Secondary | ICD-10-CM

## 2024-01-30 DIAGNOSIS — G894 Chronic pain syndrome: Secondary | ICD-10-CM | POA: Diagnosis present

## 2024-01-30 DIAGNOSIS — Z7985 Long-term (current) use of injectable non-insulin antidiabetic drugs: Secondary | ICD-10-CM

## 2024-01-30 DIAGNOSIS — F17210 Nicotine dependence, cigarettes, uncomplicated: Secondary | ICD-10-CM | POA: Diagnosis present

## 2024-01-30 DIAGNOSIS — G928 Other toxic encephalopathy: Secondary | ICD-10-CM | POA: Diagnosis present

## 2024-01-30 DIAGNOSIS — M48061 Spinal stenosis, lumbar region without neurogenic claudication: Secondary | ICD-10-CM | POA: Diagnosis present

## 2024-01-30 DIAGNOSIS — R220 Localized swelling, mass and lump, head: Secondary | ICD-10-CM

## 2024-01-30 DIAGNOSIS — I10 Essential (primary) hypertension: Secondary | ICD-10-CM | POA: Diagnosis present

## 2024-01-30 DIAGNOSIS — R918 Other nonspecific abnormal finding of lung field: Secondary | ICD-10-CM

## 2024-01-30 DIAGNOSIS — Z794 Long term (current) use of insulin: Secondary | ICD-10-CM

## 2024-01-30 DIAGNOSIS — Z86718 Personal history of other venous thrombosis and embolism: Secondary | ICD-10-CM

## 2024-01-30 DIAGNOSIS — M1990 Unspecified osteoarthritis, unspecified site: Secondary | ICD-10-CM | POA: Diagnosis present

## 2024-01-30 DIAGNOSIS — E66812 Obesity, class 2: Secondary | ICD-10-CM | POA: Diagnosis present

## 2024-01-30 DIAGNOSIS — E119 Type 2 diabetes mellitus without complications: Secondary | ICD-10-CM

## 2024-01-30 DIAGNOSIS — N329 Bladder disorder, unspecified: Secondary | ICD-10-CM | POA: Diagnosis present

## 2024-01-30 DIAGNOSIS — N1831 Chronic kidney disease, stage 3a: Secondary | ICD-10-CM | POA: Diagnosis present

## 2024-01-30 DIAGNOSIS — K219 Gastro-esophageal reflux disease without esophagitis: Secondary | ICD-10-CM | POA: Diagnosis present

## 2024-01-30 DIAGNOSIS — E1122 Type 2 diabetes mellitus with diabetic chronic kidney disease: Secondary | ICD-10-CM | POA: Diagnosis present

## 2024-01-30 LAB — AMMONIA: Ammonia: 11 umol/L (ref 9–35)

## 2024-01-30 LAB — LACTIC ACID, PLASMA
Lactic Acid, Venous: 1.9 mmol/L (ref 0.5–1.9)
Lactic Acid, Venous: 0.9 mmol/L (ref 0.5–1.9)

## 2024-01-30 LAB — COMPREHENSIVE METABOLIC PANEL
ALT: 18 U/L (ref 0–44)
AST: 26 U/L (ref 15–41)
Albumin: 4.2 g/dL (ref 3.5–5.0)
Alkaline Phosphatase: 61 U/L (ref 38–126)
Anion gap: 11 (ref 5–15)
BUN: 23 mg/dL (ref 8–23)
CO2: 28 mmol/L (ref 22–32)
Calcium: 9.4 mg/dL (ref 8.9–10.3)
Chloride: 95 mmol/L — ABNORMAL LOW (ref 98–111)
Creatinine, Ser: 1.99 mg/dL — ABNORMAL HIGH (ref 0.61–1.24)
GFR, Estimated: 36 mL/min — ABNORMAL LOW (ref >60)
Glucose, Bld: 124 mg/dL — ABNORMAL HIGH (ref 70–99)
Potassium: 4.9 mmol/L (ref 3.5–5.1)
Sodium: 134 mmol/L — ABNORMAL LOW (ref 135–145)
Total Bilirubin: 0.9 mg/dL (ref 0.0–1.2)
Total Protein: 7.8 g/dL (ref 6.5–8.1)

## 2024-01-30 LAB — BLOOD GAS, VENOUS
Acid-Base Excess: 8.8 mmol/L — ABNORMAL HIGH (ref 0.0–2.0)
Bicarbonate: 34.6 mmol/L — ABNORMAL HIGH (ref 20.0–28.0)
O2 Saturation: 54.6 %
Patient temperature: 36.9
pCO2, Ven: 51 mm[Hg] (ref 44–60)
pH, Ven: 7.44 — ABNORMAL HIGH (ref 7.25–7.43)
pO2, Ven: 31 mm[Hg] — CL (ref 32–45)

## 2024-01-30 LAB — CBC
HCT: 39.4 % (ref 39.0–52.0)
Hemoglobin: 13.5 g/dL (ref 13.0–17.0)
MCH: 30.8 pg (ref 26.0–34.0)
MCHC: 34.3 g/dL (ref 30.0–36.0)
MCV: 90 fL (ref 80.0–100.0)
Platelets: 196 10*3/uL (ref 150–400)
RBC: 4.38 MIL/uL (ref 4.22–5.81)
RDW: 14 % (ref 11.5–15.5)
WBC: 10 10*3/uL (ref 4.0–10.5)
nRBC: 0 % (ref 0.0–0.2)

## 2024-01-30 LAB — ETHANOL: Alcohol, Ethyl (B): <10 mg/dL (ref <10)

## 2024-01-30 LAB — I-STAT CHEM 8, ED
BUN: 23 mg/dL (ref 8–23)
Calcium, Ion: 1.15 mmol/L (ref 1.15–1.40)
Chloride: 96 mmol/L — ABNORMAL LOW (ref 98–111)
Creatinine, Ser: 2.3 mg/dL — ABNORMAL HIGH (ref 0.61–1.24)
Glucose, Bld: 121 mg/dL — ABNORMAL HIGH (ref 70–99)
HCT: 40 % (ref 39.0–52.0)
Hemoglobin: 13.6 g/dL (ref 13.0–17.0)
Potassium: 4.7 mmol/L (ref 3.5–5.1)
Sodium: 136 mmol/L (ref 135–145)
TCO2: 28 mmol/L (ref 22–32)

## 2024-01-30 LAB — HEMOGLOBIN A1C
Hgb A1c MFr Bld: 6.6 % — ABNORMAL HIGH (ref 4.8–5.6)
Mean Plasma Glucose: 142.72 mg/dL

## 2024-01-30 LAB — LIPASE, BLOOD: Lipase: 23 U/L (ref 11–51)

## 2024-01-30 LAB — CBG MONITORING, ED
Glucose-Capillary: 112 mg/dL — ABNORMAL HIGH (ref 70–99)
Glucose-Capillary: 206 mg/dL — ABNORMAL HIGH (ref 70–99)

## 2024-01-30 MED ORDER — HYDROCORTISONE SOD SUC (PF) 100 MG IJ SOLR
100.0000 mg | Freq: Two times a day (BID) | INTRAMUSCULAR | Status: AC
  Administered 2024-01-30 – 2024-01-31 (×3): 100 mg via INTRAVENOUS
  Filled 2024-01-30 (×3): qty 2

## 2024-01-30 MED ORDER — IOHEXOL 350 MG/ML SOLN
75.0000 mL | Freq: Once | INTRAVENOUS | Status: AC | PRN
  Administered 2024-01-30: 75 mL via INTRAVENOUS

## 2024-01-30 MED ORDER — IPRATROPIUM-ALBUTEROL 0.5-2.5 (3) MG/3ML IN SOLN
3.0000 mL | Freq: Once | RESPIRATORY_TRACT | Status: AC
  Administered 2024-01-30: 3 mL via RESPIRATORY_TRACT
  Filled 2024-01-30: qty 3

## 2024-01-30 MED ORDER — ALFUZOSIN HCL ER 10 MG PO TB24
10.0000 mg | ORAL_TABLET | Freq: Every day | ORAL | Status: DC
  Administered 2024-01-31 – 2024-02-01 (×2): 10 mg via ORAL
  Filled 2024-01-30 (×2): qty 1

## 2024-01-30 MED ORDER — INSULIN ASPART 100 UNIT/ML IJ SOLN
0.0000 [IU] | Freq: Three times a day (TID) | INTRAMUSCULAR | Status: DC
  Administered 2024-01-31 (×2): 3 [IU] via SUBCUTANEOUS

## 2024-01-30 MED ORDER — APIXABAN 5 MG PO TABS
5.0000 mg | ORAL_TABLET | Freq: Two times a day (BID) | ORAL | Status: DC
  Administered 2024-01-30 – 2024-02-01 (×4): 5 mg via ORAL
  Filled 2024-01-30 (×4): qty 1

## 2024-01-30 MED ORDER — OXYCODONE HCL 5 MG PO TABS
10.0000 mg | ORAL_TABLET | Freq: Four times a day (QID) | ORAL | Status: DC | PRN
  Administered 2024-01-30 – 2024-02-01 (×5): 10 mg via ORAL
  Filled 2024-01-30 (×5): qty 2

## 2024-01-30 MED ORDER — GADOBUTROL 1 MMOL/ML IV SOLN
10.0000 mL | Freq: Once | INTRAVENOUS | Status: AC | PRN
  Administered 2024-01-30: 10 mL via INTRAVENOUS

## 2024-01-30 MED ORDER — SODIUM CHLORIDE 0.9 % IV SOLN: INTRAVENOUS | Status: DC

## 2024-01-30 MED ORDER — INSULIN ASPART 100 UNIT/ML IJ SOLN
0.0000 [IU] | Freq: Every day | INTRAMUSCULAR | Status: DC
  Administered 2024-01-30: 2 [IU] via SUBCUTANEOUS
  Filled 2024-01-30: qty 1

## 2024-01-30 MED ORDER — INSULIN GLARGINE-YFGN 100 UNIT/ML ~~LOC~~ SOLN
15.0000 [IU] | Freq: Every day | SUBCUTANEOUS | Status: DC
  Administered 2024-01-30 – 2024-01-31 (×2): 15 [IU] via SUBCUTANEOUS
  Filled 2024-01-30 (×4): qty 0.15

## 2024-01-30 MED ORDER — ALPRAZOLAM 0.25 MG PO TABS
0.2500 mg | ORAL_TABLET | Freq: Two times a day (BID) | ORAL | Status: DC | PRN
  Administered 2024-01-31 – 2024-02-01 (×3): 0.25 mg via ORAL
  Filled 2024-01-30 (×3): qty 1

## 2024-01-30 MED ORDER — PANTOPRAZOLE SODIUM 40 MG PO TBEC
40.0000 mg | DELAYED_RELEASE_TABLET | Freq: Every day | ORAL | Status: DC
  Administered 2024-01-30 – 2024-02-01 (×3): 40 mg via ORAL
  Filled 2024-01-30 (×3): qty 1

## 2024-01-30 MED ORDER — SODIUM CHLORIDE 0.9 % IV BOLUS
1000.0000 mL | Freq: Once | INTRAVENOUS | Status: AC
  Administered 2024-01-30: 1000 mL via INTRAVENOUS

## 2024-01-30 NOTE — ED Notes (Signed)
Date and time results received: 01/30/24 1120 (use smartphrase ".now" to insert current time)  Test: po2 Critical Value: 31  Name of Provider Notified: Dr Maple Hudson  Orders Received? Or Actions Taken?: Orders Received - See Orders for details

## 2024-01-30 NOTE — ED Triage Notes (Signed)
Pt arrived via POV from home c/o generalized back pain that began this morning. Pt denies injury, denies falling. Pt unable to describe the pain.

## 2024-01-30 NOTE — ED Notes (Signed)
Patient transported to CT

## 2024-01-30 NOTE — Progress Notes (Signed)
Elert 0916  Pt reports severe back pain and leg pain/weakness. Pt was last well when he went to sleep last night. Code stroke cancelled by Dr. Maple Hudson at [phone].

## 2024-01-30 NOTE — ED Provider Notes (Signed)
Care of patient assumed from Dr. Maple Hudson.  This patient presents for encephalopathy and bilateral lower extremity weakness.  He has been somnolent while in the ED.  He is prescribed Xanax and polypharmacy as a possibility.  He had a negative CT of head.  Patient was discussed with neurologist, Dr. Selina Cooley, who recommends MRI and admission here at [facility] for routine EEG and neuroconsult in the morning if MRI is negative.  Also awaiting results of CTA. Physical Exam  BP 117/67 (BP Location: Right Arm)   Pulse 100   Temp 98.5 F (36.9 C) (Oral)   Resp 15   Ht 5\' 4"  (1.626 m)   Wt 101 kg   SpO2 93%   BMI 38.22 kg/m   Physical Exam Vitals and nursing note reviewed.  Constitutional:      General: He is not in acute distress.    Appearance: Normal appearance. He is well-developed. He is not ill-appearing, toxic-appearing or diaphoretic.  HENT:     Head: Normocephalic and atraumatic.     Right Ear: External ear normal.     Left Ear: External ear normal.     Nose: Nose normal.     Mouth/Throat:     Mouth: Mucous membranes are moist.  Eyes:     Extraocular Movements: Extraocular movements intact.     Conjunctiva/sclera: Conjunctivae normal.  Cardiovascular:     Rate and Rhythm: Normal rate and regular rhythm.  Pulmonary:     Effort: Pulmonary effort is normal. No respiratory distress.  Abdominal:     General: There is no distension.     Palpations: Abdomen is soft.  Musculoskeletal:        General: No swelling or deformity.     Cervical back: Normal range of motion and neck supple.  Skin:    General: Skin is warm and dry.     Coloration: Skin is not jaundiced or pale.  Neurological:     Mental Status: He is alert and oriented to person, place, and time.     Sensory: No sensory deficit.     Motor: Weakness (Global) present.  Psychiatric:        Mood and Affect: Mood normal.        Behavior: Behavior normal.     Procedures  Procedures  ED Course / MDM   Clinical Course as  of 01/30/24 1542  Wed Jan 30, 2024  1610 Was seen 01/15/2024 for soft tissue mass by oncology.  They noted in their note that patient does have chronic back pain from disc problems. [TY]  0940 MRI lumbar spine 01/11/24: "IMPRESSION: 1. Multilevel lumbar spondylosis, mildly progressed since 2021. 2. Mild canal stenosis at L3-4. 3. Mild-to-moderate bilateral foraminal stenosis at L4-5. 4. 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 5. Reactive subchondral marrow edema associated with the right L4-5 and L5-S1 facet joints, which may contribute to low back pain. " [TY]  1024 Patient's son presented to bedside.  Stated that patient father is not acting normal self.  Last seen normal last night.  He reports seeing his father normal last night when he came home from work.  This morning found that he had wet the bed and found father on his hands and knees on the floor seemingly mildly confused and weak. [TY]  1110 Comprehensive metabolic panel(!) Appears his kidney function has been worsening over the past several weeks.  No metabolic derangements that explain his presentation today.  No transaminitis to suggest hepatobiliary disease. [TY]  1110 CBC No leukocytosis to suggest systemic infection. [TY]  1110 Lipase: 23 Pancreatitis unlikely [TY]  1110 Lactic Acid, Venous: 1.9 Systemic infection and seizure less likely. [TY]  1111 Ammonia: 11 No transaminitis [TY]  1111 Alcohol, Ethyl (B): <10 [TY]  1408 Spoke with Dr. Selina Cooley, neurology, recommending MRI with and without given his cancer history.  If negative, can do EEG inpatient at [facility] with routine consult to Dr. Melynda Ripple as symptoms could be secondary to seizure. [TY]    Clinical Course User Index [TY] Coral Spikes, DO   Medical Decision Making Amount and/or Complexity of Data Reviewed Labs: ordered. Decision-making details documented in ED Course. Radiology: ordered.  Risk Prescription drug management.   On assessment,  patient is well-appearing.  He is alert and oriented.  He has global weakness.  Family is present at bedside.  Son describes him being normal last night.  He is typically able to ambulate without any difficulty.  This morning, he was found on the ground, confused, and covered in urine.  This, in addition to his gradual return of normal mentation raises concern for a first-time seizure.  Per chart review, recent PET scan shows hypermetabolic enlargement of left muscle of mastication, left postauricular lymph nodes, right parotid gland, and inguinal nodes.  CTA today shows suspicious spiculated left hilar mass encasing left lower lobe bronchus concerning for malignancy.  He has a hyperdense mass along posterior bladder wall.  MRI shows redemonstration of known masticator space soft tissue masses without acute intracranial findings.  Patient was admitted for further management.       Gloris Manchester, MD 01/30/24 1840

## 2024-01-30 NOTE — ED Notes (Signed)
Patient's son said patient fell yesterday and landed on his backside. Patient's son also said he had an appointment today to determine results of Thyroid cancer test.

## 2024-01-30 NOTE — H&P (Signed)
TRH H&P   Patient Demographics:    [place] Festa, is a 68 y.o. male  MRN: [id number]   DOB - July 17, 1956  Admit Date - 01/30/2024  Outpatient Primary MD for the patient is Benita Stabile, MD  Referring MD/NP/PA: Dr. Durwin Nora  Outpatient Specialists: Dr. Ellin Saba  Patient coming from: Home Home  Chief Complaint  Patient presents with   Back Pain      HPI:    Chamar Broughton  is a 68 y.o. male, with medical history significant for hypertension, hyperlipidemia, migraine headache, GERD, sleep apnea (currently not using CPAP due to being unable to tolerate it), back pain and obesity, diabetes mellitus, with recent diagnosis of left facial mass with cervical lymphadenopathy, following with Dr. Ellin Saba and ENT at [facility], with plan for biopsy end of the month, he is supposed to follow with Dr. Ellin Saba today, patient reports he woke up this morning, was able to get out of bed, all he remembers he was on the floor, ex-wife at bedside assist with the history, patient cannot provide exact history as he reports did not remember the exact details, but apparently he was on the floor, with significant lower extremity weakness, he appeared to be more lethargic, and confused, with son did not father did urinate in his bed which prompted him call EMS. -In ED patient had multiple imaging including MRI brain with no acute intracranial findings, but has significant imaging including MRI brain, CTA lumbar spine and CTA chest abdomen pelvis, findings were concerning for new mass in left hilar area, and urinary bladder mass, and known left facial mass, workup significant for increased creatinine to 2.3, sodium of 134, glucose at 124, [facility] hospitalist consulted to admit.    Review of systems:      A full 10 point Review of Systems was done, except as stated above, all other Review of Systems were  negative.   With Past History of the following :    Past Medical History:  Diagnosis Date   AKI (acute kidney injury) (HCC) 07/29/2020   Arthritis    Diabetes (HCC)    GERD (gastroesophageal reflux disease)    History of kidney stones    Hypercholesteremia    Hypertension    Migraine    Sleep apnea       Past Surgical History:  Procedure Laterality Date   ADENOIDECTOMY     APPENDECTOMY     CENTRAL VENOUS CATHETER INSERTION Left 04/20/2013   Procedure: INSERTION CENTRAL LINE LEFT SUBCLAVIAN;  Surgeon: Fabio Bering, MD;  Location: [facility];  Service: General;  Laterality: Left;   INCISION AND DRAINAGE PERIRECTAL ABSCESS N/A 04/20/2013   Procedure: SHARP SURGICAL DEBRIDEMENT PERINEAL INFECTION;  Surgeon: Fabio Bering, MD;  Location: [facility];  Service: General;  Laterality: N/A;   KNEE SURGERY Right    arthroscopic   SHOULDER SURGERY Bilateral    removal  of bone.   TONSILLECTOMY        Social History:     Social History   Tobacco Use   Smoking status: Former    Current packs/day: 0.50    Average packs/day: 0.5 packs/day for 23.0 years (11.5 ttl pk-yrs)    Types: Cigarettes   Smokeless tobacco: Never  Substance Use Topics   Alcohol use: Yes    Comment: rare       Family History :     Family History  Problem Relation Age of Onset   Cancer Mother    Dementia Father    Colon cancer Neg Hx    Rectal cancer Neg Hx    Stomach cancer Neg Hx    Esophageal cancer Neg Hx       Home Medications:   Prior to Admission medications   Medication Sig Start Date End Date Taking? Authorizing Provider  alfuzosin (UROXATRAL) 10 MG 24 hr tablet Take 10 mg by mouth daily. 08/24/20   [provider]  ALPRAZolam Prudy Feeler) 0.5 MG tablet Take by mouth.    [provider]  apixaban (ELIQUIS) 5 MG TABS tablet Take 1 tablet (5 mg total) by mouth 2 (two) times daily. Please start around August 31, 2020 after you complete the initial starter pack 08/31/20   Shon Hale, MD  Cholecalciferol (VITAMIN D3) 5000 units CAPS Take 2 capsules by mouth daily.     [provider]  dexamethasone (DECADRON) 1 MG tablet Take 1 tablet (1 mg total) by mouth daily with breakfast. 06/14/23   Nida, Denman George, MD  furosemide (LASIX) 40 MG tablet Take 40 mg by mouth daily. 07/27/22   [provider]  furosemide (LASIX) 40 MG tablet Take 1 tablet by mouth daily.    [provider]  gabapentin (NEURONTIN) 600 MG tablet Take 600 mg by mouth 3 (three) times daily. 01/02/24   [provider]  hydrocortisone (CORTEF) 10 MG tablet TAKE 1 TABLET DAILY AT 8 A.M. AND 1 TABLET DAILY AT NOON 01/17/24   Roma Kayser, MD  insulin glargine (LANTUS) 100 UNIT/ML Solostar Pen Inject 60 Units into the skin at bedtime.    [provider]  Multiple Vitamins-Minerals (MULTIVITAMIN PO) Take 1 tablet by mouth daily.    [provider]  Oxycodone HCl 10 MG TABS Take 10 mg by mouth 4 (four) times daily. 07/05/20   [provider]  pantoprazole (PROTONIX) 40 MG tablet Take 40 mg by mouth daily. 03/21/20   [provider]  potassium chloride (KLOR-CON) 10 MEQ tablet Take 10 mEq by mouth daily. 07/11/22   [provider]  rosuvastatin (CRESTOR) 10 MG tablet Take 10 mg by mouth daily.    [provider]  Sodium Sulfate-Mag Sulfate-KCl (SUTAB) [phone] MG TABS     [provider]  SUMAtriptan (IMITREX) 100 MG tablet Take 100 mg by mouth as directed. 07/03/22   [provider]  tirzepatide Greggory Keen) 12.5 MG/0.5ML Pen Inject 12.5 mg into the skin once a week. 07/23/23        Allergies:     Allergies  Allergen Reactions   Prednisone      Physical Exam:   Vitals  Blood pressure 117/67, pulse 100, temperature 98.5 F (36.9 C), temperature source Oral, resp. rate 15, height 5\' 4"  (1.626 m), weight 101 kg, SpO2 93%.   1. General: Male, laying in bed, no apparent distress  2.  Normal affect and insight, Not Suicidal or Homicidal, Awake  Alert, Oriented X 3.  3. No F.N deficits, ALL C.Nerves Intact, Strength 5/5 all 4 extremities, Sensation intact all 4 extremities, Plantars down going.  4. Ears and Eyes appear Normal, Conjunctivae clear, PERRLA. Moist Oral Mucosa.  5.  With significant left facial swelling, submental swelling  6. Symmetrical Chest wall movement, Good air movement bilaterally, CTAB.  7. RRR, No Gallops, Rubs or Murmurs, No Parasternal Heave.  8. Positive Bowel Sounds, Abdomen Soft, No tenderness, No organomegaly appriciated,No rebound -guarding or rigidity.  9.  No Cyanosis, Normal Skin Turgor, No Skin Rash or Bruise.  10. Good muscle tone,  joints appear normal , no effusions, lower extremity motor strength limited due to pain, but patient exerting 5 out of 5 motor strength   Data Review:    CBC Recent Labs  Lab 01/30/24 0924  WBC 10.0  HGB 13.5  13.6  HCT 39.4  40.0  PLT 196  MCV 90.0  MCH 30.8  MCHC 34.3  RDW 14.0   ------------------------------------------------------------------------------------------------------------------  Chemistries  Recent Labs  Lab 01/30/24 0924  NA 134*  136  K 4.9  4.7  CL 95*  96*  CO2 28  GLUCOSE 124*  121*  BUN 23  23  CREATININE 1.99*  2.30*  CALCIUM 9.4  AST 26  ALT 18  ALKPHOS 61  BILITOT 0.9   ------------------------------------------------------------------------------------------------------------------ estimated creatinine clearance is 38.1 mL/min (A) (by C-G formula based on SCr of 1.99 mg/dL (H)). ------------------------------------------------------------------------------------------------------------------ No results for input(s): "TSH", "T4TOTAL", "T3FREE", "THYROIDAB" in the last 72 hours.  Invalid input(s): "FREET3"  Coagulation profile No results for input(s): "INR", "PROTIME" in the last 168  hours. ------------------------------------------------------------------------------------------------------------------- No results for input(s): "DDIMER" in the last 72 hours. -------------------------------------------------------------------------------------------------------------------  Cardiac Enzymes No results for input(s): "CKMB", "TROPONINI", "MYOGLOBIN" in the last 168 hours.  Invalid input(s): "CK" ------------------------------------------------------------------------------------------------------------------ No results found for: "BNP"   ---------------------------------------------------------------------------------------------------------------  Urinalysis    Component Value Date/Time   COLORURINE YELLOW 07/28/2020 2353   APPEARANCEUR CLEAR 07/28/2020 2353   LABSPEC 1.018 07/28/2020 2353   PHURINE 5.0 07/28/2020 2353   GLUCOSEU >=500 (A) 07/28/2020 2353   HGBUR NEGATIVE 07/28/2020 2353   BILIRUBINUR NEGATIVE 07/28/2020 2353   KETONESUR NEGATIVE 07/28/2020 2353   PROTEINUR NEGATIVE 07/28/2020 2353   UROBILINOGEN 0.2 11/08/2013 2020   NITRITE NEGATIVE 07/28/2020 2353   LEUKOCYTESUR NEGATIVE 07/28/2020 2353    ----------------------------------------------------------------------------------------------------------------   Imaging Results:    MR Brain W and Wo Contrast Result Date: 01/30/2024 CLINICAL DATA:  Metastatic disease evaluation. Confusion. Fall. History of hematologic malignancy and lymphadenopathy. EXAM: MRI HEAD WITHOUT AND WITH CONTRAST TECHNIQUE: Multiplanar, multiecho pulse sequences of the brain and surrounding structures were obtained without and with intravenous contrast. CONTRAST:  10mL GADAVIST GADOBUTROL 1 MMOL/ML IV SOLN COMPARISON:  Head CT same day.  PET scan 01/23/2024 FINDINGS: Brain: Fusion imaging does not show any acute or subacute infarction. No focal abnormality affects the brainstem or cerebellum. Cerebral hemispheres show mild  chronic small-vessel ischemic changes of the white matter. No cortical or large vessel territory infarction. No intracranial mass lesion, hemorrhage, hydrocephalus or extra-axial collection. No abnormal brain or leptomeningeal enhancement occurs. Vascular: Major vessels at the base of the brain show flow. Skull and upper cervical spine: No bone abnormality seen. Sinuses/Orbits: Mucosal inflammatory changes of the paranasal sinuses. Orbits negative. Other: Soft tissue masses of the scalp and the masticator space on the left as shown previously. IMPRESSION: 1. No acute or reversible intracranial finding. No evidence of intracranial metastatic disease. Mild  chronic small-vessel ischemic changes of the cerebral hemispheric white matter. 2. Soft tissue masses of the scalp and masticator space on the left as shown previously. Electronically Signed   By: Paulina Fusi M.D.   On: 01/30/2024 18:35   CT Angio Chest/Abd/Pel for Dissection W and/or Wo Contrast Result Date: 01/30/2024 CLINICAL DATA:  Larey Seat yesterday, seizure, low back pain, cough, history of hematologic malignancy and head and neck lymphadenopathy EXAM: CT ANGIOGRAPHY CHEST, ABDOMEN AND PELVIS TECHNIQUE: Non-contrast CT of the chest was initially obtained. Multidetector CT imaging through the chest, abdomen and pelvis was performed using the standard protocol during bolus administration of intravenous contrast. Multiplanar reconstructed images and MIPs were obtained and reviewed to evaluate the vascular anatomy. RADIATION DOSE REDUCTION: This exam was performed according to the departmental dose-optimization program which includes automated exposure control, adjustment of the mA and/or kV according to patient size and/or use of iterative reconstruction technique. CONTRAST:  75mL OMNIPAQUE IOHEXOL 350 MG/ML SOLN COMPARISON:  01/30/2024, 01/23/2024 FINDINGS: CTA CHEST FINDINGS Cardiovascular: No evidence of thoracic aortic aneurysm or dissection. The heart is  unremarkable without pericardial effusion. There is adequate opacification of the central and segmental pulmonary arteries, with no filling defects or pulmonary emboli identified. Atherosclerosis of the aorta and coronary vasculature. Mediastinum/Nodes: No enlarged mediastinal, hilar, or axillary lymph nodes. Thyroid gland, trachea, and esophagus demonstrate no significant findings. Lungs/Pleura: 1.8 x 1.4 cm spiculated left hilar mass encasing the left lower lobe superior segment bronchus, reference image 60/6. This area appears hypermetabolic on recent PET scan, and is highly concerning for malignancy. Bronchoscopy may be useful for further evaluation. No acute airspace disease, effusion, or pneumothorax. Central airways are patent. Musculoskeletal: No acute or destructive bony abnormalities. Reconstructed images demonstrate no additional findings. Review of the MIP images confirms the above findings. CTA ABDOMEN AND PELVIS FINDINGS VASCULAR Aorta: Normal caliber aorta without aneurysm, dissection, vasculitis or significant stenosis. Mild atherosclerosis. Celiac: Patent without evidence of aneurysm, dissection, vasculitis or significant stenosis. SMA: Patent without evidence of aneurysm, dissection, vasculitis or significant stenosis. Renals: Both renal arteries are patent without evidence of aneurysm, dissection, vasculitis, fibromuscular dysplasia or significant stenosis. Mild atherosclerosis within the proximal left renal artery. IMA: Patent without evidence of aneurysm, dissection, vasculitis or significant stenosis. Inflow: Patent without evidence of aneurysm, dissection, vasculitis or significant stenosis. Veins: No obvious venous abnormality within the limitations of this arterial phase study. Review of the MIP images confirms the above findings. NON-VASCULAR Hepatobiliary: No focal liver abnormality is seen. No gallstones, gallbladder wall thickening, or biliary dilatation. Pancreas: Unremarkable. No  pancreatic ductal dilatation or surrounding inflammatory changes. Spleen: Normal in size without focal abnormality. Adrenals/Urinary Tract: Kidneys enhance normally and symmetrically. No urinary tract calculi or obstructive uropathy. The adrenals are unremarkable. There is a 1.6 x 1.4 x 1.2 cm hyperdense mass along the posterior aspect of the bladder just superior to the right UVJ, reference image 164/4. Cystoscopy is recommended for further evaluation. Stomach/Bowel: No bowel obstruction or ileus. The appendix is surgically absent. Diverticulosis of the descending and sigmoid colon without evidence of diverticulitis. No bowel wall thickening or inflammatory change. Lymphatic: Stable borderline enlarged lymph nodes within the bilateral inguinal regions. No other pathologic adenopathy. Reproductive: Prostate is unremarkable. Other: No free fluid or free intraperitoneal gas. Stable fat containing left inguinal hernia. No bowel herniation. Musculoskeletal: No acute or destructive bony abnormalities. Reconstructed images demonstrate no additional findings. Review of the MIP images confirms the above findings. IMPRESSION: Vascular: 1. No evidence of thoracoabdominal aortic aneurysm  or dissection. 2. No evidence of pulmonary embolus. 3.  Aortic Atherosclerosis (ICD10-I70.0). Nonvascular: 1. 1.8 x 1.4 cm spiculated left hilar mass, encasing the left lower lobe superior segmental bronchus at its origin, highly concerning for malignancy. This area appears hypermetabolic on recent PET scan. Bronchoscopy may be useful further evaluation. 2. 1.6 cm hyperdense mass along the posterior wall the bladder. Cystoscopy recommended for further evaluation. 3. Stable borderline enlarged bilateral inguinal lymph nodes, nonspecific. 4. Distal colonic diverticulosis without diverticulitis. Electronically Signed   By: Sharlet Salina M.D.   On: 01/30/2024 15:42   CT Head Wo Contrast Result Date: 01/30/2024 CLINICAL DATA:  Larey Seat yesterday,  seizure, history of hematologic malignancy and lymphadenopathy EXAM: CT HEAD WITHOUT CONTRAST TECHNIQUE: Contiguous axial images were obtained from the base of the skull through the vertex without intravenous contrast. RADIATION DOSE REDUCTION: This exam was performed according to the departmental dose-optimization program which includes automated exposure control, adjustment of the mA and/or kV according to patient size and/or use of iterative reconstruction technique. COMPARISON:  12/28/2023, 01/23/2024 FINDINGS: Brain: No acute infarct or hemorrhage. Lateral ventricles and midline structures are unremarkable. No acute extra-axial fluid collections. No mass effect. Vascular: No hyperdense vessel or unexpected calcification. Skull: Soft tissue nodularity along the outer table of the left parietal region of the calvarium is again noted, measuring up to 7 mm in thickness reference image 44/3, corresponding to hypermetabolic deposits on recent PET scan. Negative for fracture or focal lesion. Sinuses/Orbits: Enlargement of the left muscles of mastication is noted, measuring 4.1 x 2.2 cm reference image 11/3, previously measuring 3.8 x 1.8 cm. This was noted to be hypermetabolic on recent PET scan 01/23/2024. Stable left periauricular adenopathy, measuring up to 10 mm in short axis reference image 3/3, also hypermetabolic on recent PET scan. Paranasal sinuses are unremarkable. Other: None. IMPRESSION: 1. No acute infarct or hemorrhage. 2. Stable enlargement of the left muscles of mastication, subcutaneous soft tissue nodularity along the left parietal region of the calvarium, and left periauricular adenopathy, consistent with underlying malignancy as noted on recent PET scan. Electronically Signed   By: Sharlet Salina M.D.   On: 01/30/2024 15:28   DG Chest Portable 1 View Result Date: 01/30/2024 CLINICAL DATA:  Cough EXAM: PORTABLE CHEST 1 VIEW COMPARISON:  02/13/2019 FINDINGS: The heart size and mediastinal contours  are within normal limits. Both lungs are clear. The visualized skeletal structures are unremarkable. IMPRESSION: No active disease. Electronically Signed   By: Duanne Guess D.O.   On: 01/30/2024 11:04   CT Lumbar Spine Wo Contrast Result Date: 01/30/2024 CLINICAL DATA:  Low back pain with cauda equina syndrome suspected EXAM: CT LUMBAR SPINE WITHOUT CONTRAST TECHNIQUE: Multidetector CT imaging of the lumbar spine was performed without intravenous contrast administration. Multiplanar CT image reconstructions were also generated. RADIATION DOSE REDUCTION: This exam was performed according to the departmental dose-optimization program which includes automated exposure control, adjustment of the mA and/or kV according to patient size and/or use of iterative reconstruction technique. COMPARISON:  Lumbar MRI 01/11/2024 FINDINGS: Segmentation: 5 lumbar type vertebrae. Alignment: Normal. Vertebrae: No acute fracture or focal pathologic process. Paraspinal and other soft tissues: Negative. Disc levels: T12- L1: Spondylitic spurring.  No neural impingement. L1-L2: Disc space narrowing with ventral spondylitic spurring. No neural impingement. L2-L3: Disc space narrowing with mild endplate and facet spurring. No neural compression. L3-L4: Disc space narrowing with endplate and facet spurring. Mild narrowing of the thecal sac. L4-L5: Disc narrowing with endplate and moderate facet spurring. Mild  narrowing of the thecal sac and foramina. L5-S1:Disc space narrowing with endplate ridging. Degenerative facet spurring which is asymmetrically bulky on the right. IMPRESSION: 1. No acute finding. 2. 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 Electronically Signed   By: Tiburcio Pea M.D.   On: 01/30/2024 11:00     EKG:  Vent. rate 111 BPM PR interval 160 ms QRS duration 94 ms QT/QTcB 310/422 ms P-R-T axes 65 57 31 Sinus tachycardia Ventricular premature  complex Aberrant conduction of SV complex(es) Baseline wander in lead(s) V3   Assessment & Plan:    Principal Problem:   AMS (altered mental status) Active Problems:   AKI (acute kidney injury) (HCC)   Essential hypertension   Diabetes mellitus (HCC)   GERD (gastroesophageal reflux disease)   Adrenal insufficiency (HCC)   Chronic kidney disease, stage 3a (HCC)   Chronic low back pain   Lumbar spondylosis   Morbid obesity (HCC)   AMS -With transient episode of altered mental status, confusion, had urine incontinence -ED discussed with neurology who recommended admission for routine EEG and neuroconsult in the morning to rule out seizures -Will keep on seizures precaution. -Patient currently back to baseline. -No acute findings on MRI brain -Patient on multiple medications including Xanax, oxycodone and gabapentin which may contribute to encephalopathy in the setting of worsening renal failure, but this event appears to be of acute sudden onset and rapid resolution.  I will hold his gabapentin to renal function improves, and upon oxycodone but on as needed basis, and same for Xanax.   Left facial mass, with lymphadenopathy New finding of left hilar mass New finding of urinary bladder mass -He does following with Dr. Ellin Saba, supposed to have biopsy at the end of the month -Will place routine consult for oncology to evaluate regarding further recommendation, as understand [place] patient is anxious about these recent findings  AKI on CKD -Creatinine elevated at 2.3, will give IV fluids and hold nephrotoxic medication  History of DVT -Continue with Eliquis  Diabetes mellitus, type II -Patient reports he is on units of Lantus daily, did not receive his dose this morning, but CBGs controlled currently, so we will start on 15 units of Semglee, and insulin sliding scale  Neuropathy Chronic pain syndrome Chronic lower back pain -Hold gabapentin for renal function  improves -Continue with home dose oxycodone as as needed  Hyperlipidemia -Resume statin when stable  Adrenal insufficiency -Will give 100 mg IV cortisone every 12 hours for now, then transition to home dose hydrocortisone tomorrow if he continues to improve  GERD - Continue with PPI  Obesity class II Body mass index is 38.22 kg/m. On Zepbound  DVT Prophylaxis >> on Eliquis  AM Labs Ordered, also please review Full Orders  Family Communication: Admission, patients condition and plan of care including tests being ordered have been discussed with the patient and ex-wife at bedside* who indicate understanding and agree with the plan and Code Status.  Code Status full code  Likely DC to home  Consults called: Neurology and oncology consult requested in epic  Admission status: Observation  Time spent in minutes : 70 minutes   Huey Bienenstock M.D on 01/30/2024 at 7:48 PM   [facility] - Office  [phone]

## 2024-01-30 NOTE — ED Provider Notes (Signed)
[facility] AT [facility] Provider Note   CSN: [id number] Arrival date & time: 01/30/24  4098     History  Chief Complaint  Patient presents with   Back Pain    Shawn Fleming is a 68 y.o. male.  This is a 68 year old male presenting emergency department for bilateral lower extremity weakness and back pain.  He has a history of chronic back pain.  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  He went to bed normal last night around 10 PM, woke this morning with generalized weakness and states he felt like his bilateral legs were not working and his back pain was worse.  Son later presented to bedside and stated that he found father on his hands and knees and had to use significant help to get him to stand.  He did not hit his head, no LOC.  Son noted that father seemingly more lethargic and confused this morning on arrival, but has gradually improved here in the emergency department.  Son did note that father did urinate his bed.   Back Pain      Home Medications Prior to Admission medications   Medication Sig Start Date End Date Taking? Authorizing Provider  alfuzosin (UROXATRAL) 10 MG 24 hr tablet Take 10 mg by mouth daily. 08/24/20   [provider]  ALPRAZolam Prudy Feeler) 0.5 MG tablet Take by mouth.    [provider]  apixaban (ELIQUIS) 5 MG TABS tablet Take 1 tablet (5 mg total) by mouth 2 (two) times daily. Please start around August 31, 2020 after you complete the initial starter pack 08/31/20   Shon Hale, MD  Cholecalciferol (VITAMIN D3) 5000 units CAPS Take 2 capsules by mouth daily.     [provider]  dexamethasone (DECADRON) 1 MG tablet Take 1 tablet (1 mg total) by mouth daily with breakfast. 06/14/23   Nida, Denman George, MD  furosemide (LASIX) 40 MG tablet Take 40 mg by mouth daily. 07/27/22   [provider]  furosemide (LASIX) 40  MG tablet Take 1 tablet by mouth daily.    [provider]  gabapentin (NEURONTIN) 600 MG tablet Take 600 mg by mouth 3 (three) times daily. 01/02/24   [provider]  hydrocortisone (CORTEF) 10 MG tablet TAKE 1 TABLET DAILY AT 8 A.M. AND 1 TABLET DAILY AT NOON 01/17/24   Roma Kayser, MD  insulin glargine (LANTUS) 100 UNIT/ML Solostar Pen Inject 60 Units into the skin at bedtime.    [provider]  Multiple Vitamins-Minerals (MULTIVITAMIN PO) Take 1 tablet by mouth daily.    [provider]  Oxycodone HCl 10 MG TABS Take 10 mg by mouth 4 (four) times daily. 07/05/20   [provider]  pantoprazole (PROTONIX) 40 MG tablet Take 40 mg by mouth daily. 03/21/20   [provider]  potassium chloride (KLOR-CON) 10 MEQ tablet Take 10 mEq by mouth daily. 07/11/22   [provider]  rosuvastatin (CRESTOR) 10 MG tablet Take 10 mg by mouth daily.    [provider]  Sodium Sulfate-Mag Sulfate-KCl (SUTAB) 9307223080 MG TABS     [provider]  SUMAtriptan (IMITREX) 100 MG tablet Take 100 mg by mouth as directed. 07/03/22   [provider]  tirzepatide Greggory Keen) 12.5 MG/0.5ML Pen Inject 12.5 mg into the skin once a week. 07/23/23         Allergies  Prednisone    Review of Systems   Review of Systems  Musculoskeletal:  Positive for back pain.    Physical Exam Updated Vital Signs BP (!) 97/56 (BP Location: Right Arm)   Pulse (!) 110   Temp 98.5 F (36.9 C) (Oral)   Resp 18   Ht 5\' 4"  (1.626 m)   Wt 101 kg   SpO2 96%   BMI 38.22 kg/m  Physical Exam Vitals and nursing note reviewed.  Constitutional:      General: He is not in acute distress.    Appearance: He is obese.  HENT:     Head: Normocephalic.     Nose: Nose normal.  Eyes:     Comments: Constricted pupils, but equal.  Full EOM.  Cardiovascular:     Rate and Rhythm: Normal rate and regular rhythm.  Pulmonary:     Effort: Pulmonary  effort is normal.     Breath sounds: Wheezing present.  Abdominal:     General: Abdomen is flat. There is no distension.     Palpations: Abdomen is soft.     Tenderness: There is no abdominal tenderness. There is no guarding or rebound.  Musculoskeletal:        General: Normal range of motion.     Cervical back: Normal range of motion and neck supple.     Comments: Initially bilateral lower extremities with global weakness.  3 out of 5 strength plantarflexion dorsiflexion and lifting leg off bed.  Had normal sensation.  2+ DP pulse on right, 1+ on left.  Warm well-perfused.  Soft compartments.  Skin:    General: Skin is warm and dry.     Capillary Refill: Capillary refill takes less than 2 seconds.  Neurological:     Mental Status: He is alert.     Comments: Patient tremulous in upper extremities; appeared similar to asterixis.  Normal sensation.  Normal strength in upper extremities.  Cranial nerves intact.  Psychiatric:     Comments: Somnolent/minor confused     ED Results / Procedures / Treatments   Labs (all labs ordered are listed, but only abnormal results are displayed) Labs Reviewed  COMPREHENSIVE METABOLIC PANEL - Abnormal; Notable for the following components:      Result Value   Sodium 134 (*)    Chloride 95 (*)    Glucose, Bld 124 (*)    Creatinine, Ser 1.99 (*)    GFR, Estimated 36 (*)    All other components within normal limits  BLOOD GAS, VENOUS - Abnormal; Notable for the following components:   pH, Ven 7.44 (*)    pO2, Ven 31 (*)    Bicarbonate 34.6 (*)    Acid-Base Excess 8.8 (*)    All other components within normal limits  CBG MONITORING, ED - Abnormal; Notable for the following components:   Glucose-Capillary 112 (*)    All other components within normal limits  I-STAT CHEM 8, ED - Abnormal; Notable for the following components:   Chloride 96 (*)    Creatinine, Ser 2.30 (*)    Glucose, Bld 121 (*)    All other components within normal limits  CBC   LIPASE, BLOOD  AMMONIA  ETHANOL  LACTIC ACID, PLASMA  LACTIC ACID, PLASMA  URINALYSIS, ROUTINE W REFLEX MICROSCOPIC  RAPID URINE DRUG SCREEN, HOSP PERFORMED  CBG MONITORING, ED    EKG EKG Interpretation Date/Time:  Wednesday January 30 2024 09:18:10 EST Ventricular Rate:  111 PR Interval:  160 QRS Duration:  94 QT Interval:  310 QTC Calculation: 422 R Axis:   57  Text Interpretation: Sinus tachycardia Ventricular premature complex Aberrant conduction of SV complex(es) Baseline wander in lead(s) V3 Confirmed by Estanislado Pandy [phone]) on 01/30/2024 9:38:13 AM  Radiology DG Chest Portable 1 View Result Date: 01/30/2024 CLINICAL DATA:  Cough EXAM: PORTABLE CHEST 1 VIEW COMPARISON:  02/13/2019 FINDINGS: The heart size and mediastinal contours are within normal limits. Both lungs are clear. The visualized skeletal structures are unremarkable. IMPRESSION: No active disease. Electronically Signed   By: Duanne Guess D.O.   On: 01/30/2024 11:04   CT Lumbar Spine Wo Contrast Result Date: 01/30/2024 CLINICAL DATA:  Low back pain with cauda equina syndrome suspected EXAM: CT LUMBAR SPINE WITHOUT CONTRAST TECHNIQUE: Multidetector CT imaging of the lumbar spine was performed without intravenous contrast administration. Multiplanar CT image reconstructions were also generated. RADIATION DOSE REDUCTION: This exam was performed according to the departmental dose-optimization program which includes automated exposure control, adjustment of the mA and/or kV according to patient size and/or use of iterative reconstruction technique. COMPARISON:  Lumbar MRI 01/11/2024 FINDINGS: Segmentation: 5 lumbar type vertebrae. Alignment: Normal. Vertebrae: No acute fracture or focal pathologic process. Paraspinal and other soft tissues: Negative. Disc levels: T12- L1: Spondylitic spurring.  No neural impingement. L1-L2: Disc space narrowing with ventral spondylitic spurring. No neural impingement. L2-L3: Disc space  narrowing with mild endplate and facet spurring. No neural compression. L3-L4: Disc space narrowing with endplate and facet spurring. Mild narrowing of the thecal sac. L4-L5: Disc narrowing with endplate and moderate facet spurring. Mild narrowing of the thecal sac and foramina. L5-S1:Disc space narrowing with endplate ridging. Degenerative facet spurring which is asymmetrically bulky on the right. IMPRESSION: 1. No acute finding. 2. 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 Electronically Signed   By: Tiburcio Pea M.D.   On: 01/30/2024 11:00    Procedures Procedures    Medications Ordered in ED Medications  ipratropium-albuterol (DUONEB) 0.5-2.5 (3) MG/3ML nebulizer solution 3 mL (3 mLs Nebulization Given 01/30/24 1052)  ipratropium-albuterol (DUONEB) 0.5-2.5 (3) MG/3ML nebulizer solution 3 mL (3 mLs Nebulization Given 01/30/24 1348)  sodium chloride 0.9 % bolus 1,000 mL (1,000 mLs Intravenous New Bag/Given 01/30/24 1248)  iohexol (OMNIPAQUE) 350 MG/ML injection 75 mL (75 mLs Intravenous Contrast Given 01/30/24 1304)    ED Course/ Medical Decision Making/ A&P Clinical Course as of 01/30/24 1441  Wed Jan 30, 2024  0939 Was seen 01/15/2024 for soft tissue mass by oncology.  They noted in their note that patient does have chronic back pain from disc problems. [TY]  0940 MRI lumbar spine 01/11/24: "IMPRESSION: 1. Multilevel lumbar spondylosis, mildly progressed since 2021. 2. Mild canal stenosis at L3-4. 3. Mild-to-moderate bilateral foraminal stenosis at L4-5. 4. 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 5. Reactive subchondral marrow edema associated with the right L4-5 and L5-S1 facet joints, which may contribute to low back pain. " [TY]  1024 Patient's son presented to bedside.  Stated that patient father is not acting normal self.  Last seen normal last night.  He reports seeing his father normal last night when he  came home from work.  This morning found that he had wet the bed and found father on his hands and knees on the floor seemingly mildly confused and weak. [TY]  1110 Comprehensive metabolic panel(!) Appears his kidney function has been worsening over the past several weeks.  No metabolic derangements  that explain his presentation today.  No transaminitis to suggest hepatobiliary disease. [TY]  1110 CBC No leukocytosis to suggest systemic infection. [TY]  1110 Lipase: 23 Pancreatitis unlikely [TY]  1110 Lactic Acid, Venous: 1.9 Systemic infection and seizure less likely. [TY]  1111 Ammonia: 11 No transaminitis [TY]  1111 Alcohol, Ethyl (B): <10 [TY]  1408 Spoke with Dr. Selina Cooley, neurology, recommending MRI with and without given his cancer history.  If negative, can do EEG inpatient at [facility] with routine consult to Dr. Melynda Ripple as symptoms could be secondary to seizure. [TY]    Clinical Course User Index [TY] Coral Spikes, DO                                 Medical Decision Making This is a 68 year old male presenting emergency department with altered mental status and lower extremity weakness bilaterally.  Soft blood pressure on arrival, but improved with IV fluids.  On exam simile has no localizing motor deficits as it is bilateral weak distal lower extremities.  He also seemed quite somnolent/lethargic.  Question postictal given urinated the bed.  However did not bite his tongue; also does not have an elevated lactate.  Concern for spinal pathology given his history of chronic back pain.  However had MRI recently which was reassuring.  Lumbar CT scan also reassuring.  Given pulse discrepancy concern for throat vessel dissection.  Waiting radiology official read, however on my independent interpretation do not appreciate acute aortic pathology.  Patient did have some wheezing on exam, borderline oxygen levels.  Was given DuoNebs.  Labs showed AKI, but otherwise largely reassuring with no  obvious signs of infection.  Tox workup negative.  Ammonia not elevated.  No transaminitis to suggest hepatic encephalopathy.  CT head, independent review negative for acute intracranial pathology.  Case discussed with neurology; see ED course.  Patient symptoms are seemingly improved, but not back to baseline. Plan for MRI and admission.   Amount and/or Complexity of Data Reviewed Independent Historian:     Details: Son noted father urinated bed and found him on knees in bedroom. He is bedside and notes father is improving.  Patient also denies alcohol use since and states that father has no problem with alcohol or drugs. 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 Decision-making details documented in ED Course.    Details: Does have Xanax and oxycodone listed as medication.  Perhaps polypharmacy. Radiology: ordered and independent interpretation performed. ECG/medicine tests: independent interpretation performed. Discussion of management or test interpretation with external provider(s): Neurology.  Risk Prescription drug management. Decision regarding hospitalization.          Final Clinical Impression(s) / ED Diagnoses Final diagnoses:  None    Rx / DC Orders ED Discharge Orders     None         Coral Spikes, DO 01/30/24 1441

## 2024-01-30 NOTE — ED Triage Notes (Signed)
Pt reports the pain in his back is so bad, he is unable to wlak. Pt does endorse recent cough, and does have audible wheezing.

## 2024-01-30 NOTE — ED Notes (Signed)
During Triage, Pt lethargic, confused, unsure why he is the hospital. Pt falling a sleep while being asked questions. EDP paged to treatment room to evaluate the Pt.

## 2024-01-31 ENCOUNTER — Inpatient Hospital Stay (HOSPITAL_COMMUNITY)
Admit: 2024-01-31 | Discharge: 2024-01-31 | Disposition: A | Discharge status: Home / Self Care | Payer: Medicare Other | Attending: Internal Medicine

## 2024-01-31 DIAGNOSIS — R220 Localized swelling, mass and lump, head: Secondary | ICD-10-CM | POA: Diagnosis not present

## 2024-01-31 DIAGNOSIS — G928 Other toxic encephalopathy: Secondary | ICD-10-CM | POA: Diagnosis present

## 2024-01-31 DIAGNOSIS — Z86718 Personal history of other venous thrombosis and embolism: Secondary | ICD-10-CM | POA: Diagnosis not present

## 2024-01-31 DIAGNOSIS — E1122 Type 2 diabetes mellitus with diabetic chronic kidney disease: Secondary | ICD-10-CM | POA: Diagnosis present

## 2024-01-31 DIAGNOSIS — R918 Other nonspecific abnormal finding of lung field: Secondary | ICD-10-CM | POA: Diagnosis present

## 2024-01-31 DIAGNOSIS — Z6838 Body mass index (BMI) 38.0-38.9, adult: Secondary | ICD-10-CM | POA: Diagnosis not present

## 2024-01-31 DIAGNOSIS — Z794 Long term (current) use of insulin: Secondary | ICD-10-CM | POA: Diagnosis not present

## 2024-01-31 DIAGNOSIS — F17210 Nicotine dependence, cigarettes, uncomplicated: Secondary | ICD-10-CM | POA: Diagnosis present

## 2024-01-31 DIAGNOSIS — Z1152 Encounter for screening for COVID-19: Secondary | ICD-10-CM | POA: Diagnosis not present

## 2024-01-31 DIAGNOSIS — R4182 Altered mental status, unspecified: Secondary | ICD-10-CM | POA: Diagnosis not present

## 2024-01-31 DIAGNOSIS — G9340 Encephalopathy, unspecified: Principal | ICD-10-CM

## 2024-01-31 DIAGNOSIS — K219 Gastro-esophageal reflux disease without esophagitis: Secondary | ICD-10-CM | POA: Diagnosis present

## 2024-01-31 DIAGNOSIS — M47816 Spondylosis without myelopathy or radiculopathy, lumbar region: Secondary | ICD-10-CM | POA: Diagnosis present

## 2024-01-31 DIAGNOSIS — E1140 Type 2 diabetes mellitus with diabetic neuropathy, unspecified: Secondary | ICD-10-CM | POA: Diagnosis present

## 2024-01-31 DIAGNOSIS — Z7901 Long term (current) use of anticoagulants: Secondary | ICD-10-CM | POA: Diagnosis not present

## 2024-01-31 DIAGNOSIS — N1831 Chronic kidney disease, stage 3a: Secondary | ICD-10-CM | POA: Diagnosis present

## 2024-01-31 DIAGNOSIS — I129 Hypertensive chronic kidney disease with stage 1 through stage 4 chronic kidney disease, or unspecified chronic kidney disease: Secondary | ICD-10-CM | POA: Diagnosis present

## 2024-01-31 DIAGNOSIS — E7800 Pure hypercholesterolemia, unspecified: Secondary | ICD-10-CM | POA: Diagnosis present

## 2024-01-31 DIAGNOSIS — N329 Bladder disorder, unspecified: Secondary | ICD-10-CM | POA: Diagnosis present

## 2024-01-31 DIAGNOSIS — E2740 Unspecified adrenocortical insufficiency: Secondary | ICD-10-CM | POA: Diagnosis present

## 2024-01-31 DIAGNOSIS — Z7985 Long-term (current) use of injectable non-insulin antidiabetic drugs: Secondary | ICD-10-CM | POA: Diagnosis not present

## 2024-01-31 DIAGNOSIS — N179 Acute kidney failure, unspecified: Secondary | ICD-10-CM | POA: Diagnosis present

## 2024-01-31 DIAGNOSIS — G894 Chronic pain syndrome: Secondary | ICD-10-CM | POA: Diagnosis present

## 2024-01-31 DIAGNOSIS — R569 Unspecified convulsions: Secondary | ICD-10-CM

## 2024-01-31 DIAGNOSIS — M549 Dorsalgia, unspecified: Secondary | ICD-10-CM | POA: Diagnosis present

## 2024-01-31 DIAGNOSIS — R32 Unspecified urinary incontinence: Secondary | ICD-10-CM | POA: Diagnosis present

## 2024-01-31 DIAGNOSIS — J101 Influenza due to other identified influenza virus with other respiratory manifestations: Secondary | ICD-10-CM | POA: Diagnosis not present

## 2024-01-31 DIAGNOSIS — E66812 Obesity, class 2: Secondary | ICD-10-CM | POA: Diagnosis present

## 2024-01-31 LAB — GLUCOSE, CAPILLARY
Glucose-Capillary: 163 mg/dL — ABNORMAL HIGH (ref 70–99)
Glucose-Capillary: 152 mg/dL — ABNORMAL HIGH (ref 70–99)
Glucose-Capillary: 96 mg/dL (ref 70–99)

## 2024-01-31 LAB — CBC
HCT: 36.2 % — ABNORMAL LOW (ref 39.0–52.0)
Hemoglobin: 12.2 g/dL — ABNORMAL LOW (ref 13.0–17.0)
MCH: 30.3 pg (ref 26.0–34.0)
MCHC: 33.7 g/dL (ref 30.0–36.0)
MCV: 89.8 fL (ref 80.0–100.0)
Platelets: 152 10*3/uL (ref 150–400)
RBC: 4.03 MIL/uL — ABNORMAL LOW (ref 4.22–5.81)
RDW: 14.3 % (ref 11.5–15.5)
WBC: 5.4 10*3/uL (ref 4.0–10.5)
nRBC: 0 % (ref 0.0–0.2)

## 2024-01-31 LAB — HIV ANTIBODY (ROUTINE TESTING W REFLEX): HIV Screen 4th Generation wRfx: NONREACTIVE

## 2024-01-31 LAB — BASIC METABOLIC PANEL
Anion gap: 8 (ref 5–15)
BUN: 21 mg/dL (ref 8–23)
CO2: 27 mmol/L (ref 22–32)
Calcium: 8.7 mg/dL — ABNORMAL LOW (ref 8.9–10.3)
Chloride: 103 mmol/L (ref 98–111)
Creatinine, Ser: 1.42 mg/dL — ABNORMAL HIGH (ref 0.61–1.24)
GFR, Estimated: 54 mL/min — ABNORMAL LOW (ref >60)
Glucose, Bld: 155 mg/dL — ABNORMAL HIGH (ref 70–99)
Potassium: 4.4 mmol/L (ref 3.5–5.1)
Sodium: 138 mmol/L (ref 135–145)

## 2024-01-31 LAB — CBG MONITORING, ED: Glucose-Capillary: 107 mg/dL — ABNORMAL HIGH (ref 70–99)

## 2024-01-31 MED ORDER — HYDROCORTISONE 10 MG PO TABS
10.0000 mg | ORAL_TABLET | Freq: Two times a day (BID) | ORAL | Status: DC
  Administered 2024-02-01 (×2): 10 mg via ORAL
  Filled 2024-01-31 (×9): qty 1

## 2024-01-31 MED ORDER — INSULIN GLARGINE-YFGN 100 UNIT/ML ~~LOC~~ SOLN
25.0000 [IU] | Freq: Every day | SUBCUTANEOUS | Status: DC
  Administered 2024-02-01: 25 [IU] via SUBCUTANEOUS
  Filled 2024-01-31 (×2): qty 0.25

## 2024-01-31 MED ORDER — LORATADINE 10 MG PO TABS
10.0000 mg | ORAL_TABLET | Freq: Every day | ORAL | Status: DC
  Administered 2024-01-31 – 2024-02-01 (×2): 10 mg via ORAL
  Filled 2024-01-31 (×2): qty 1

## 2024-01-31 NOTE — Plan of Care (Signed)
  Problem: Acute Rehab PT Goals(only PT should resolve) Goal: Pt Will Go Supine/Side To Sit Outcome: Progressing Flowsheets (Taken 01/31/2024 1221) Pt will go Supine/Side to Sit:  with modified independence  with supervision Goal: Patient Will Transfer Sit To/From Stand Outcome: Progressing Flowsheets (Taken 01/31/2024 1221) Patient will transfer sit to/from stand:  with modified independence  with supervision Goal: Pt Will Transfer Bed To Chair/Chair To Bed Outcome: Progressing Flowsheets (Taken 01/31/2024 1221) Pt will Transfer Bed to Chair/Chair to Bed:  with modified independence  with supervision Goal: Pt Will Ambulate Outcome: Progressing Flowsheets (Taken 01/31/2024 1221) Pt will Ambulate:  100 feet  with supervision  with least restrictive assistive device   12:22 PM, 01/31/24 Ocie Bob, MPT Physical Therapist with [facility] 336 [phone] office [phone] mobile phone

## 2024-01-31 NOTE — Evaluation (Signed)
Occupational Therapy Evaluation Patient Details Name: Shawn Fleming MRN: [id number] DOB: 1956-10-20 Today's Date: 01/31/2024   History of Present Illness   Shawn Fleming  is a 68 y.o. male, with medical history significant for hypertension, hyperlipidemia, migraine headache, GERD, sleep apnea (currently not using CPAP due to being unable to tolerate it), back pain and obesity, diabetes mellitus, with recent diagnosis of left facial mass with cervical lymphadenopathy, following with Dr. Ellin Saba and ENT at [facility], with plan for biopsy end of the month, he is supposed to follow with Dr. Ellin Saba today, patient reports he woke up this morning, was able to get out of bed, all he remembers he was on the floor, ex-wife at bedside assist with the history, patient cannot provide exact history as he reports did not remember the exact details, but apparently he was on the floor, with significant lower extremity weakness, he appeared to be more lethargic, and confused, with son did not father did urinate in his bed which prompted him call EMS.  -In ED patient had multiple imaging including MRI brain with no acute intracranial findings, but has significant imaging including MRI brain, CTA lumbar spine and CTA chest abdomen pelvis, findings were concerning for new mass in left hilar area, and urinary bladder mass, and known left facial mass, workup significant for increased creatinine to 2.3, sodium of 134, glucose at 124, [facility] hospitalist consulted to admit. (per MD)     Clinical Impressions Pt agreeable to OT and PT co-evaluation. Pt is independent and works at baseline. Pt noted to require minimal hand held assist to pull to sit at EOB. Pt ambulated well in the room for toilet transfer and was able to stand and urinate without assist. Pt did have an episode of loss of balance while ambulating in the hall requiring him to sit and rest before walking the rest of the way. Good ability to complete ADL's.  Deferring to PT for balance and ambulation. Pt is not recommended for further acute OT services and will be discharged to care of nursing staff for remaining length of stay.      If plan is discharge home, recommend the following:   A little help with walking and/or transfers     Functional Status Assessment   Patient has had a recent decline in their functional status and demonstrates the ability to make significant improvements in function in a reasonable and predictable amount of time.     Equipment Recommendations   None recommended by OT              Precautions/Restrictions   Precautions Precautions: Fall Recall of Precautions/Restrictions: Intact Restrictions Weight Bearing Restrictions Per Provider Order: No     Mobility Bed Mobility Overal bed mobility: Needs Assistance Bed Mobility: Supine to Sit     Supine to sit: Min assist     General bed mobility comments: single hand held assist to pull to sit; labored effort    Transfers Overall transfer level: Needs assistance   Transfers: Sit to/from Stand, Bed to chair/wheelchair/BSC Sit to Stand: Supervision, Contact guard assist     Step pivot transfers: Supervision, Contact guard assist     General transfer comment: momentary loss of balance requiring pt to sit was needed halfway through ambulation; no issues with simple transfers within the room without AD.      Balance Overall balance assessment: Needs assistance Sitting-balance support: No upper extremity supported, Feet supported Sitting balance-Leahy Scale: Good Sitting balance - Comments: seated at EOB  Standing balance support: No upper extremity supported, During functional activity Standing balance-Leahy Scale: Fair Standing balance comment: without AD                           ADL either performed or assessed with clinical judgement   ADL Overall ADL's : Needs assistance/impaired     Grooming:  Supervision/safety;Contact guard assist;Standing   Upper Body Bathing: Modified independent;Sitting   Lower Body Bathing: Modified independent;Sitting/lateral leans       Lower Body Dressing: Modified independent;Sitting/lateral leans   Toilet Transfer: Supervision/safety;Contact guard assist;Ambulation Toilet Transfer Details (indicate cue type and reason): EOB to toilet without AD Toileting- Clothing Manipulation and Hygiene: Modified independent;Set up;Sitting/lateral lean       Functional mobility during ADLs: Supervision/safety;Contact guard assist General ADL Comments: Able to ambulate over 100 feet in the hall without AD; required rest due to instances of starting to lose balance.     Vision Baseline Vision/History: 1 Wears glasses;4 Cataracts Ability to See in Adequate Light: 1 Impaired Patient Visual Report: No change from baseline Vision Assessment?: No apparent visual deficits     Perception Perception: Not tested       Praxis Praxis: Not tested       Pertinent Vitals/Pain Pain Assessment Pain Assessment: Faces Faces Pain Scale: Hurts even more Pain Location: L side pain Pain Descriptors / Indicators: Grimacing, Guarding Pain Intervention(s): Monitored during session, Repositioned     Extremity/Trunk Assessment Upper Extremity Assessment Upper Extremity Assessment: Overall WFL for tasks assessed   Lower Extremity Assessment Lower Extremity Assessment: Defer to PT evaluation   Cervical / Trunk Assessment Cervical / Trunk Assessment: Normal   Communication Communication Communication: No apparent difficulties   Cognition Arousal: Alert Behavior During Therapy: WFL for tasks assessed/performed Cognition: No apparent impairments                               Following commands: Intact       Cueing  General Comments   Cueing Techniques: Verbal cues                 Home Living Family/patient expects to be discharged to::  Private residence Living Arrangements: Alone Available Help at Discharge: Family;Available PRN/intermittently (most of the time) Type of Home: Apartment Home Access: Level entry     Home Layout: One level     Bathroom Shower/Tub: Chief Strategy Officer: Standard Bathroom Accessibility: No   Home Equipment: None          Prior Functioning/Environment Prior Level of Function : Independent/Modified Independent             Mobility Comments: Community ambulator without AD; works; drives ADLs Comments: Independent; working                            Co-evaluation PT/OT/SLP Co-Evaluation/Treatment: Yes Reason for Co-Treatment: To address functional/ADL transfers   OT goals addressed during session: ADL's and self-care      AM-PAC OT "6 Clicks" Daily Activity     Outcome Measure Help from another person eating meals?: None Help from another person taking care of personal grooming?: None Help from another person toileting, which includes using toliet, bedpan, or urinal?: None Help from another person bathing (including washing, rinsing, drying)?: None Help from another person to put on and taking off regular upper body clothing?:  None Help from another person to put on and taking off regular lower body clothing?: None 6 Click Score: 24   End of Session    Activity Tolerance: Patient tolerated treatment well Patient left: in bed;with call bell/phone within reach;with family/visitor present  OT Visit Diagnosis: Unsteadiness on feet (R26.81);Other abnormalities of gait and mobility (R26.89)                Time: 5284-1324 OT Time Calculation (min): 24 min Charges:  OT General Charges $OT Visit: 1 Visit OT Evaluation $OT Eval Low Complexity: 1 Low  Danashia Landers OT, MOT   Danie Chandler 01/31/2024, 10:26 AM

## 2024-01-31 NOTE — Progress Notes (Signed)
EEG complete - results pending

## 2024-01-31 NOTE — Progress Notes (Addendum)
Progress Note   Patient: Shawn Fleming ZOX:[id number] DOB: Aug 29, 1956 DOA: 01/30/2024     0 DOS: the patient was seen and examined on 01/31/2024   Brief hospital course: 68 year old man with PMH of HTN, HLD, migraine, GERD, OSA, T2DM, recently diagnosed left facial mass with cervical lymphadenopathy, currently following up with Dr. Ellin Saba and ENT who presented to the ED with altered mental status, history of fall.  Patient reports an episode of urinary incontinence preceding his symptoms.  ED workup revealed no acute intracranial findings but patient seems to have a new left hilar mass and urinary bladder mass.  He also presented with AKI.  Assessment and Plan: AMS -With transient episode of altered mental status, confusion, had urine incontinence -ED discussed with neurology who recommended admission for routine EEG and neuroconsult in the morning to rule out seizures -Will keep on seizures precaution. -Patient currently back to baseline. -No acute findings on MRI brain -Patient on multiple medications including Xanax, oxycodone and gabapentin which may contribute to encephalopathy in the setting of worsening renal failure, but this event appears to be of acute sudden onset and rapid resolution.   -Home gabapentin being held. -Continue as needed oxycodone, Xanax.   -EEG. -Follow-up on neurology recs.   Left facial mass, with lymphadenopathy New finding of left hilar mass New finding of urinary bladder mass Concern for malignancy -Patient is following with Dr. Ellin Saba and an [facility] ENT specialist. Scheduled for facial biopsy 02/13/2024.   Patient had a PET scan on 01/23/2024. Dr. Ellin Saba informed of patient's admission. -For outpatient follow-up.   AKI on CKD Patient presented with creatinine of 2.3 from baseline of approximately 1.5. Likely prerenal due to poor p.o. intake. AKI resolved with IV fluids. -DC IV fluids. -Encourage p.o. intake.   History of  DVT -Continue with Eliquis   Diabetes mellitus, type II -Patient reports he is on 50 units of Lantus daily. -Blood sugars currently controlled. -Continue glargine insulin at half home dose. -Sliding scale insulin. -Diabetic diet.   Neuropathy Chronic pain syndrome Chronic lower back pain -Hold gabapentin till renal function improves -Continue with home dose oxycodone as as needed   Hyperlipidemia -Resume statin when stable   Adrenal insufficiency -Will give 100 mg IV cortisone every 12 hours for now, then transition to home dose hydrocortisone tomorrow if he continues to improve   GERD - Continue with PPI   Obesity class II Body mass index is 38.22 kg/m. On Zepbound  DVT PPX: Systemic anticoagulation with Eliquis.     Subjective: Patient states he is feeling better.  He appears to be back to his baseline.  States he is scheduled for biopsy of the facial mass on 02/13/2024.  Physical Exam: Vitals:   01/31/24 0700 01/31/24 0833 01/31/24 0900 01/31/24 1330  BP: 125/81 128/78 124/73 124/68  Pulse: 80 83 86 85  Resp: 18 20 20 20   Temp:  (!) 97.5 F (36.4 C) (!) 97.5 F (36.4 C) 98.6 F (37 C)  TempSrc:  Oral  Oral  SpO2: 99% 99% 94% 95%  Weight:  100.5 kg    Height:  5\' 4"  (1.626 m)       General: Alert, oriented X3  Eyes: Pupils equal, reactive  Oral cavity: moist mucous membranes  Head: Atraumatic, normocephalic. Facial swelling left side Neck: supple  Chest: clear to auscultation. No crackles, no wheezes  CVS: S1,S2 RRR. No murmurs  Abd: No distention, soft, non-tender. No masses palpable  Extr: No edema   MSK:  No joint deformities or swelling  Neurological: Grossly intact.   Data Reviewed:      Latest Ref Rng & Units 01/31/2024    2:54 AM 01/30/2024    9:24 AM 01/22/2024    1:07 PM  BMP  Glucose 70 - 99 mg/dL 295  621    308  657   BUN 8 - 23 mg/dL 21  23    23  17    Creatinine 0.61 - 1.24 mg/dL 8.46  9.62    9.52  8.41   BUN/Creat Ratio 10 -  24   11   Sodium 135 - 145 mmol/L 138  134    136  143   Potassium 3.5 - 5.1 mmol/L 4.4  4.9    4.7  4.7   Chloride 98 - 111 mmol/L 103  95    96  100   CO2 22 - 32 mmol/L 27  28  29    Calcium 8.9 - 10.3 mg/dL 8.7  9.4  32.4       Latest Ref Rng & Units 01/31/2024    2:54 AM 01/30/2024    9:24 AM 01/15/2024    2:13 PM  CBC  WBC 4.0 - 10.5 K/uL 5.4  10.0  10.7   Hemoglobin 13.0 - 17.0 g/dL 40.1  02.7    25.3  66.4   Hematocrit 39.0 - 52.0 % 36.2  39.4    40.0  44.3   Platelets 150 - 400 K/uL 152  196  261      Family Communication: Patient's son at bedside  Disposition: Status is: Inpatient Remains inpatient appropriate because: Undergoing workup for altered mental status, being treated for acute kidney injury  Planned Discharge Destination: Home    Time spent: 45 minutes  Author: Marcine Matar, MD 01/31/2024 4:06 PM  For on call review [email or web address].

## 2024-01-31 NOTE — Procedures (Signed)
Patient Name: Shawn Fleming  MRN: [id number]  Epilepsy Attending: Charlsie Quest  Referring Physician/Provider: Gloris Manchester, MD  Date: 01/31/2024 Duration: 22.11 mins  Patient history: 68yo m with transient episode of altered mental status, confusion, had urine incontinence. EEG to evaluate for seizure  Level of alertness: Awake  AEDs during EEG study: None  Technical aspects: This EEG study was done with scalp electrodes positioned according to the 10-20 International system of electrode placement. Electrical activity was reviewed with band pass filter of 1-70Hz , sensitivity of 7 uV/mm, display speed of 48mm/sec with a 60Hz  notched filter applied as appropriate. EEG data were recorded continuously and digitally stored.  Video monitoring was available and reviewed as appropriate.  Description: The posterior dominant rhythm consists of 9 Hz activity of moderate voltage (25-35 uV) seen predominantly in posterior head regions, symmetric and reactive to eye opening and eye closing. Hyperventilation and photic stimulation were not performed.     IMPRESSION: This study is within normal limits. No seizures or epileptiform discharges were seen throughout the recording.  A normal interictal EEG does not exclude the diagnosis of epilepsy.  Shawn Fleming [name]

## 2024-01-31 NOTE — Evaluation (Signed)
Physical Therapy Evaluation Patient Details Name: Shawn Fleming MRN: [id number] DOB: 1956-03-10 Today's Date: 01/31/2024  History of Present Illness  Shawn Fleming  is a 68 y.o. male, with medical history significant for hypertension, hyperlipidemia, migraine headache, GERD, sleep apnea (currently not using CPAP due to being unable to tolerate it), back pain and obesity, diabetes mellitus, with recent diagnosis of left facial mass with cervical lymphadenopathy, following with Dr. Ellin Saba and ENT at [facility], with plan for biopsy end of the month, he is supposed to follow with Dr. Ellin Saba today, patient reports he woke up this morning, was able to get out of bed, all he remembers he was on the floor, ex-wife at bedside assist with the history, patient cannot provide exact history as he reports did not remember the exact details, but apparently he was on the floor, with significant lower extremity weakness, he appeared to be more lethargic, and confused, with son did not father did urinate in his bed which prompted him call EMS.  -In ED patient had multiple imaging including MRI brain with no acute intracranial findings, but has significant imaging including MRI brain, CTA lumbar spine and CTA chest abdomen pelvis, findings were concerning for new mass in left hilar area, and urinary bladder mass, and known left facial mass, workup significant for increased creatinine to 2.3, sodium of 134, glucose at 124, [facility] hospitalist consulted to admit.   Clinical Impression  Patient had difficulty siting up at bedside with [facility] flat mostly due to left flank pain, able to ambulate in room, hallway without AD, but had one episode of feeling faint/dizziness and had to sit in chair for a few minutes before walking back to room without need for an AD.  Patient on room air with SpO2 above 95% during ambulation. Patient tolerated sitting up at bedside with his son present after therapy.  Patient will benefit from  continued skilled physical therapy in hospital and recommended venue below to increase strength, balance, endurance for safe ADLs and gait.         If plan is discharge home, recommend the following: Help with stairs or ramp for entrance;Assistance with cooking/housework;A little help with walking and/or transfers;A little help with bathing/dressing/bathroom   Can travel by private vehicle        Equipment Recommendations None recommended by PT  Recommendations for Other Services       Functional Status Assessment Patient has had a recent decline in their functional status and demonstrates the ability to make significant improvements in function in a reasonable and predictable amount of time.     Precautions / Restrictions Precautions Precautions: Fall Recall of Precautions/Restrictions: Intact Restrictions Weight Bearing Restrictions Per Provider Order: No      Mobility  Bed Mobility Overal bed mobility: Needs Assistance Bed Mobility: Supine to Sit     Supine to sit: Min assist     General bed mobility comments: increased time, labored movment    Transfers Overall transfer level: Needs assistance Equipment used: None Transfers: Sit to/from Stand, Bed to chair/wheelchair/BSC Sit to Stand: Supervision, Contact guard assist   Step pivot transfers: Supervision, Contact guard assist       General transfer comment: slightly labored movement, limited mostly due to episodes of dizziness    Ambulation/Gait Ambulation/Gait assistance: Contact guard assist Gait Distance (Feet): 65 Feet Assistive device: None Gait Pattern/deviations: Decreased step length - right, Decreased step length - left, Decreased stride length Gait velocity: decreased     General Gait Details: slightly  labored cadence with one episode of near loss of balance possibly due to dizziness, had to sit for 3-4 minutes before retruning to room without requiring use of an AD  Stairs             Wheelchair Mobility     Tilt Bed    Modified Rankin (Stroke Patients Only)       Balance Overall balance assessment: Needs assistance Sitting-balance support: Feet supported, No upper extremity supported Sitting balance-Leahy Scale: Good Sitting balance - Comments: seated at EOB   Standing balance support: No upper extremity supported, During functional activity Standing balance-Leahy Scale: Fair Standing balance comment: without AD                             Pertinent Vitals/Pain Pain Assessment Pain Assessment: Faces Faces Pain Scale: Hurts even more Pain Location: L side pain Pain Descriptors / Indicators: Grimacing, Guarding Pain Intervention(s): Limited activity within patient's tolerance, Monitored during session, Repositioned    Home Living Family/patient expects to be discharged to:: Private residence Living Arrangements: Alone Available Help at Discharge: Family;Available PRN/intermittently Type of Home: Apartment Home Access: Level entry       Home Layout: One level Home Equipment: None      Prior Function Prior Level of Function : Independent/Modified Independent             Mobility Comments: Community ambulator without AD; works; drives ADLs Comments: Independent; working     Extremity/Trunk Assessment   Upper Extremity Assessment Upper Extremity Assessment: Defer to OT evaluation    Lower Extremity Assessment Lower Extremity Assessment: Overall WFL for tasks assessed    Cervical / Trunk Assessment Cervical / Trunk Assessment: Normal  Communication   Communication Communication: No apparent difficulties    Cognition Arousal: Alert Behavior During Therapy: WFL for tasks assessed/performed   PT - Cognitive impairments: No apparent impairments                         Following commands: Intact       Cueing Cueing Techniques: Verbal cues     General Comments      Exercises     Assessment/Plan     PT Assessment Patient needs continued PT services  PT Problem List Decreased strength;Decreased activity tolerance;Decreased balance;Decreased mobility       PT Treatment Interventions DME instruction;Gait training;Stair training;Functional mobility training;Therapeutic exercise;Therapeutic activities;Balance training;Patient/family education    PT Goals (Current goals can be found in the Care Plan section)  Acute Rehab PT Goals Patient Stated Goal: return home with family to assist PT Goal Formulation: With patient/family Time For Goal Achievement: 02/04/24 Potential to Achieve Goals: Good    Frequency Min 3X/week     Co-evaluation PT/OT/SLP Co-Evaluation/Treatment: Yes Reason for Co-Treatment: To address functional/ADL transfers PT goals addressed during session: Mobility/safety with mobility;Balance;Proper use of DME OT goals addressed during session: ADL's and self-care       AM-PAC PT "6 Clicks" Mobility  Outcome Measure Help needed turning from your back to your side while in a flat bed without using bedrails?: A Little Help needed moving from lying on your back to sitting on the side of a flat bed without using bedrails?: A Little Help needed moving to and from a bed to a chair (including a wheelchair)?: None Help needed standing up from a chair using your arms (e.g., wheelchair or bedside chair)?: None Help needed to walk  in hospital room?: A Little Help needed climbing 3-5 steps with a railing? : A Little 6 Click Score: 20    End of Session   Activity Tolerance: Patient tolerated treatment well;Patient limited by fatigue Patient left: in bed;with call bell/phone within reach;with family/visitor present Nurse Communication: Mobility status PT Visit Diagnosis: Unsteadiness on feet (R26.81);Other abnormalities of gait and mobility (R26.89);Muscle weakness (generalized) (M62.81)    Time: 6962-9528 PT Time Calculation (min) (ACUTE ONLY): 27 min   Charges:   PT  Evaluation $PT Eval Moderate Complexity: 1 Mod PT Treatments $Therapeutic Activity: 23-37 mins PT General Charges $$ ACUTE PT VISIT: 1 Visit         12:20 PM, 01/31/24 Ocie Bob, MPT Physical Therapist with [facility] [phone] office [phone] mobile phone

## 2024-01-31 NOTE — Consult Note (Signed)
[facility] Consultation Oncology  Name: Shawn Fleming      MRN: [id number]    Location: N829/F621-30  Date: 01/31/2024 Time:4:42 PM   REFERRING PHYSICIAN: Dr. 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  He was seen by me on 01/15/2024 in the office for left facial soft tissue mass, 57 pound weight loss in the last 2 years although some of it from [facility] for the last 1 and half year.  I have ordered a PET scan which showed hypermetabolic enlargement of left muscle of mastication just beneath the zygomatic arch and left level 2, level 3 and posterior triangle lymph nodes and a less well-defined hypermetabolic tissue in the right parotid gland.  He was also evaluated by ENT at [facility].  He is scheduled to have biopsy of the facial mass on 02/13/2024.  He was seen in the ER on 01/30/2024 with altered mental status and history of fall.  In the ER MRI of the brain showed no intracranial metastatic disease.  Soft tissue masses of the scalp and the masticator space on the left were seen.  A CT angiogram of the chest, abdomen and pelvis showed 1.8 x 1.4 cm spiculated left hilar mass encasing the left lower lobe superior segment bronchus.  There is also a 1.6 x 1.4 x 1.2 cm hypodense mass along the posterior aspect of the bladder just posterior to the right UVJ.  Stable borderline enlarged bilateral inguinal lymph nodes nonspecific.  EEG  is being planned followed by neurology consultation to evaluate for seizures.  PAST MEDICAL HISTORY:   Past Medical History:  Diagnosis Date   AKI (acute kidney injury) (HCC) 07/29/2020   Arthritis    Diabetes (HCC)    GERD (gastroesophageal reflux disease)    History of kidney stones    Hypercholesteremia    Hypertension    Migraine    Sleep apnea      ALLERGIES: Allergies  Allergen Reactions   Prednisone Other (See Comments)    Unknown       MEDICATIONS: I have reviewed the patient's current medications.     PAST SURGICAL HISTORY Past Surgical History:  Procedure Laterality Date   ADENOIDECTOMY     APPENDECTOMY     CENTRAL VENOUS CATHETER INSERTION Left 04/20/2013   Procedure: INSERTION CENTRAL LINE LEFT SUBCLAVIAN;  Surgeon: Fabio Bering, MD;  Location: [facility];  Service: General;  Laterality: Left;   INCISION AND DRAINAGE PERIRECTAL ABSCESS N/A 04/20/2013   Procedure: SHARP SURGICAL DEBRIDEMENT PERINEAL INFECTION;  Surgeon: Fabio Bering, MD;  Location: [facility];  Service: General;  Laterality: N/A;   KNEE SURGERY Right    arthroscopic   SHOULDER SURGERY Bilateral    removal of bone.   TONSILLECTOMY      FAMILY HISTORY: Family History  Problem Relation Age of Onset   Cancer Mother    Dementia Father    Colon cancer Neg Hx    Rectal cancer Neg Hx    Stomach cancer Neg Hx    Esophageal cancer Neg Hx     SOCIAL HISTORY:  reports that he has quit smoking. His smoking use included cigarettes. He has a 11.5 pack-year smoking history. He has never used smokeless tobacco. He reports current alcohol use. He reports that he does not use drugs.  PERFORMANCE STATUS: The patient's performance status is 1 - Symptomatic but completely ambulatory  PHYSICAL EXAM: Most Recent Vital Signs: Blood pressure 124/68, pulse 85, temperature 98.6 F (37 C), temperature source Oral, resp. rate 20, height 5\' 4"  (1.626 m), weight 221 lb 9 oz (100.5 kg), SpO2 95%. BP 118/73   Pulse 89   Temp 97.8 F (36.6 C) (Oral)   Resp 20   Ht 5\' 4"  (1.626 m)   Wt 221 lb 9 oz (100.5 kg)   SpO2 97%   BMI 38.03 kg/m  General appearance: alert, cooperative, and appears stated age Neurologic: Grossly normal  LABORATORY DATA:  Results for orders placed or performed during the hospital encounter of 01/30/24 (from the past 48 hours)  Ammonia      Status: None   Collection Time: 01/30/24  9:19 AM  Result Value Ref Range   Ammonia 11 9 - 35 umol/L    Comment: Performed at [facility], [address] [place], [place] [address]  Ethanol     Status: None   Collection Time: 01/30/24  9:19 AM  Result Value Ref Range   Alcohol, Ethyl (B) <10 <10 mg/dL    Comment: (NOTE) Lowest detectable limit for serum alcohol is 10 mg/dL.  For medical purposes only. Performed at [facility], [address], [place], [place] [address]   Lactic acid, plasma     Status: None   Collection Time: 01/30/24  9:19 AM  Result Value Ref Range   Lactic Acid, Venous 1.9 0.5 - 1.9 mmol/L    Comment: Performed at [facility], [address], [place], [place] [address]  CBG monitoring, ED     Status: Abnormal   Collection Time: 01/30/24  9:22 AM  Result Value Ref Range   Glucose-Capillary 112 (H) 70 - 99 mg/dL    Comment: Glucose reference range applies only to samples taken after fasting for at least 8 hours.  CBC     Status: None   Collection Time: 01/30/24  9:24 AM  Result Value Ref Range   WBC 10.0 4.0 - 10.5 K/uL   RBC 4.38 4.22 - 5.81 MIL/uL   Hemoglobin 13.5 13.0 - 17.0 g/dL   HCT 08.6 57.8 - 46.9 %   MCV 90.0 80.0 - 100.0 fL   MCH 30.8 26.0 - 34.0 pg   MCHC 34.3 30.0 - 36.0 g/dL   RDW 62.9 52.8 - 41.3 %   Platelets 196 150 - 400 K/uL   nRBC 0.0 0.0 - 0.2 %    Comment: Performed at [facility], [address], [place], [place] [address]  Comprehensive metabolic panel     Status: Abnormal   Collection Time: 01/30/24  9:24 AM  Result Value Ref Range   Sodium 134 (L) 135 - 145 mmol/L   Potassium 4.9 3.5 - 5.1 mmol/L   Chloride 95 (L) 98 - 111 mmol/L   CO2 28 22 - 32 mmol/L   Glucose, Bld 124 (H) 70 - 99 mg/dL    Comment: Glucose reference range applies only to samples taken after fasting for at least 8 hours.   BUN 23 8 - 23 mg/dL   Creatinine, Ser 0.27 (H) 0.61 - 1.24 mg/dL   Calcium 9.4 8.9 - 25.3 mg/dL   Total Protein 7.8 6.5 - 8.1 g/dL    Albumin 4.2 3.5 - 5.0 g/dL   AST 26 15 - 41 U/L   ALT 18 0 - 44 U/L   Alkaline Phosphatase 61 38 - 126  U/L   Total Bilirubin 0.9 0.0 - 1.2 mg/dL   GFR, Estimated 36 (L) >60 mL/min    Comment: (NOTE) Calculated using the CKD-EPI Creatinine Equation (2021)    Anion gap 11 5 - 15    Comment: Performed at [facility], [address] [place], [place] [address]  Lipase, blood     Status: None   Collection Time: 01/30/24  9:24 AM  Result Value Ref Range   Lipase 23 11 - 51 U/L    Comment: Performed at [facility], [address], [place], [place] [address]  I-stat chem 8, ed     Status: Abnormal   Collection Time: 01/30/24  9:24 AM  Result Value Ref Range   Sodium 136 135 - 145 mmol/L   Potassium 4.7 3.5 - 5.1 mmol/L   Chloride 96 (L) 98 - 111 mmol/L   BUN 23 8 - 23 mg/dL   Creatinine, Ser 8.46 (H) 0.61 - 1.24 mg/dL   Glucose, Bld 962 (H) 70 - 99 mg/dL    Comment: Glucose reference range applies only to samples taken after fasting for at least 8 hours.   Calcium, Ion 1.15 1.15 - 1.40 mmol/L   TCO2 28 22 - 32 mmol/L   Hemoglobin 13.6 13.0 - 17.0 g/dL   HCT 95.2 84.1 - 32.4 %  Hemoglobin A1c     Status: Abnormal   Collection Time: 01/30/24  9:24 AM  Result Value Ref Range   Hgb A1c MFr Bld 6.6 (H) 4.8 - 5.6 %    Comment: (NOTE) Pre diabetes:          5.7%-6.4%  Diabetes:              >6.4%  Glycemic control for   <7.0% adults with diabetes    Mean Plasma Glucose 142.72 mg/dL    Comment: Performed at [facility], [address], [place], [place] [address]  Lactic acid, plasma     Status: None   Collection Time: 01/30/24 11:05 AM  Result Value Ref Range   Lactic Acid, Venous 0.9 0.5 - 1.9 mmol/L    Comment: Performed at [facility], [address] [place], [place] [address]  Blood gas, venous (at [facility] and [facility])     Status: Abnormal   Collection Time: 01/30/24 11:05 AM  Result Value Ref Range   pH, Ven 7.44 (H) 7.25 - 7.43   pCO2, Ven 51 44 - 60 mmHg   pO2, Ven 31  (LL) 32 - 45 mmHg    Comment: CRITICAL RESULT CALLED TO, READ BACK BY AND VERIFIED WITH: K EDWARDS RN [id number] K FORSYTH    Bicarbonate 34.6 (H) 20.0 - 28.0 mmol/L   Acid-Base Excess 8.8 (H) 0.0 - 2.0 mmol/L   O2 Saturation 54.6 %   Patient temperature 36.9    Collection site LEFT ANTECUBITAL    Drawn by Pat Kocher     Comment: Performed at [facility] [place], [address], [place], [place] [address]  POC CBG, ED     Status: Abnormal   Collection Time: 01/30/24  9:02 PM  Result Value Ref Range   Glucose-Capillary 206 (H) 70 - 99 mg/dL    Comment: Glucose reference range applies only to samples taken after fasting for at least 8 hours.  HIV Antibody (routine testing w rflx)     Status: None   Collection Time: 01/31/24  2:54 AM  Result Value Ref Range   HIV Screen 4th Generation wRfx Non Reactive Non  Reactive    Comment: Performed at [facility], [address] [place], [place] [address]  Basic metabolic panel     Status: Abnormal   Collection Time: 01/31/24  2:54 AM  Result Value Ref Range   Sodium 138 135 - 145 mmol/L   Potassium 4.4 3.5 - 5.1 mmol/L   Chloride 103 98 - 111 mmol/L   CO2 27 22 - 32 mmol/L   Glucose, Bld 155 (H) 70 - 99 mg/dL    Comment: Glucose reference range applies only to samples taken after fasting for at least 8 hours.   BUN 21 8 - 23 mg/dL   Creatinine, Ser 6.04 (H) 0.61 - 1.24 mg/dL   Calcium 8.7 (L) 8.9 - 10.3 mg/dL   GFR, Estimated 54 (L) >60 mL/min    Comment: (NOTE) Calculated using the CKD-EPI Creatinine Equation (2021)    Anion gap 8 5 - 15    Comment: Performed at [facility], [address], [place], [place] [address]  CBC     Status: Abnormal   Collection Time: 01/31/24  2:54 AM  Result Value Ref Range   WBC 5.4 4.0 - 10.5 K/uL   RBC 4.03 (L) 4.22 - 5.81 MIL/uL   Hemoglobin 12.2 (L) 13.0 - 17.0 g/dL   HCT 11.9 (L) 14.7 - 82.9 %   MCV 89.8 80.0 - 100.0 fL   MCH 30.3 26.0 - 34.0 pg   MCHC 33.7 30.0 - 36.0 g/dL   RDW 56.2 13.0 - 86.5  %   Platelets 152 150 - 400 K/uL   nRBC 0.0 0.0 - 0.2 %    Comment: Performed at [facility], [address] [place], [place] [address]  CBG monitoring, ED     Status: Abnormal   Collection Time: 01/31/24  8:02 AM  Result Value Ref Range   Glucose-Capillary 107 (H) 70 - 99 mg/dL    Comment: Glucose reference range applies only to samples taken after fasting for at least 8 hours.  Glucose, capillary     Status: Abnormal   Collection Time: 01/31/24 11:35 AM  Result Value Ref Range   Glucose-Capillary 163 (H) 70 - 99 mg/dL    Comment: Glucose reference range applies only to samples taken after fasting for at least 8 hours.  Glucose, capillary     Status: Abnormal   Collection Time: 01/31/24  4:37 PM  Result Value Ref Range   Glucose-Capillary 152 (H) 70 - 99 mg/dL    Comment: Glucose reference range applies only to samples taken after fasting for at least 8 hours.      RADIOGRAPHY: MR Brain W and Wo Contrast Result Date: 01/30/2024 CLINICAL DATA:  Metastatic disease evaluation. Confusion. Fall. History of hematologic malignancy and lymphadenopathy. EXAM: MRI HEAD WITHOUT AND WITH CONTRAST TECHNIQUE: Multiplanar, multiecho pulse sequences of the brain and surrounding structures were obtained without and with intravenous contrast. CONTRAST:  10mL GADAVIST GADOBUTROL 1 MMOL/ML IV SOLN COMPARISON:  Head CT same day.  PET scan 01/23/2024 FINDINGS: Brain: Fusion imaging does not show any acute or subacute infarction. No focal abnormality affects the brainstem or cerebellum. Cerebral hemispheres show mild chronic small-vessel ischemic changes of the white matter. No cortical or large vessel territory infarction. No intracranial mass lesion, hemorrhage, hydrocephalus or extra-axial collection. No abnormal brain or leptomeningeal enhancement occurs. Vascular: Major vessels at the base of the brain show flow. Skull and upper cervical spine: No bone abnormality seen. Sinuses/Orbits: Mucosal inflammatory  changes of the paranasal sinuses.  Orbits negative. Other: Soft tissue masses of the scalp and the masticator space on the left as shown previously. IMPRESSION: 1. No acute or reversible intracranial finding. No evidence of intracranial metastatic disease. Mild chronic small-vessel ischemic changes of the cerebral hemispheric white matter. 2. Soft tissue masses of the scalp and masticator space on the left as shown previously. Electronically Signed   By: Paulina Fusi M.D.   On: 01/30/2024 18:35   CT Angio Chest/Abd/Pel for Dissection W and/or Wo Contrast Result Date: 01/30/2024 CLINICAL DATA:  Larey Seat yesterday, seizure, low back pain, cough, history of hematologic malignancy and head and neck lymphadenopathy EXAM: CT ANGIOGRAPHY CHEST, ABDOMEN AND PELVIS TECHNIQUE: Non-contrast CT of the chest was initially obtained. Multidetector CT imaging through the chest, abdomen and pelvis was performed using the standard protocol during bolus administration of intravenous contrast. Multiplanar reconstructed images and MIPs were obtained and reviewed to evaluate the vascular anatomy. RADIATION DOSE REDUCTION: This exam was performed according to the departmental dose-optimization program which includes automated exposure control, adjustment of the mA and/or kV according to patient size and/or use of iterative reconstruction technique. CONTRAST:  75mL OMNIPAQUE IOHEXOL 350 MG/ML SOLN COMPARISON:  01/30/2024, 01/23/2024 FINDINGS: CTA CHEST FINDINGS Cardiovascular: No evidence of thoracic aortic aneurysm or dissection. The heart is unremarkable without pericardial effusion. There is adequate opacification of the central and segmental pulmonary arteries, with no filling defects or pulmonary emboli identified. Atherosclerosis of the aorta and coronary vasculature. Mediastinum/Nodes: No enlarged mediastinal, hilar, or axillary lymph nodes. Thyroid gland, trachea, and esophagus demonstrate no significant findings. Lungs/Pleura: 1.8 x  1.4 cm spiculated left hilar mass encasing the left lower lobe superior segment bronchus, reference image 60/6. This area appears hypermetabolic on recent PET scan, and is highly concerning for malignancy. Bronchoscopy may be useful for further evaluation. No acute airspace disease, effusion, or pneumothorax. Central airways are patent. Musculoskeletal: No acute or destructive bony abnormalities. Reconstructed images demonstrate no additional findings. Review of the MIP images confirms the above findings. CTA ABDOMEN AND PELVIS FINDINGS VASCULAR Aorta: Normal caliber aorta without aneurysm, dissection, vasculitis or significant stenosis. Mild atherosclerosis. Celiac: Patent without evidence of aneurysm, dissection, vasculitis or significant stenosis. SMA: Patent without evidence of aneurysm, dissection, vasculitis or significant stenosis. Renals: Both renal arteries are patent without evidence of aneurysm, dissection, vasculitis, fibromuscular dysplasia or significant stenosis. Mild atherosclerosis within the proximal left renal artery. IMA: Patent without evidence of aneurysm, dissection, vasculitis or significant stenosis. Inflow: Patent without evidence of aneurysm, dissection, vasculitis or significant stenosis. Veins: No obvious venous abnormality within the limitations of this arterial phase study. Review of the MIP images confirms the above findings. NON-VASCULAR Hepatobiliary: No focal liver abnormality is seen. No gallstones, gallbladder wall thickening, or biliary dilatation. Pancreas: Unremarkable. No pancreatic ductal dilatation or surrounding inflammatory changes. Spleen: Normal in size without focal abnormality. Adrenals/Urinary Tract: Kidneys enhance normally and symmetrically. No urinary tract calculi or obstructive uropathy. The adrenals are unremarkable. There is a 1.6 x 1.4 x 1.2 cm hyperdense mass along the posterior aspect of the bladder just superior to the right UVJ, reference image 164/4.  Cystoscopy is recommended for further evaluation. Stomach/Bowel: No bowel obstruction or ileus. The appendix is surgically absent. Diverticulosis of the descending and sigmoid colon without evidence of diverticulitis. No bowel wall thickening or inflammatory change. Lymphatic: Stable borderline enlarged lymph nodes within the bilateral inguinal regions. No other pathologic adenopathy. Reproductive: Prostate is unremarkable. Other: No free fluid or free intraperitoneal gas. Stable fat containing left inguinal hernia.  No bowel herniation. Musculoskeletal: No acute or destructive bony abnormalities. Reconstructed images demonstrate no additional findings. Review of the MIP images confirms the above findings. IMPRESSION: Vascular: 1. No evidence of thoracoabdominal aortic aneurysm or dissection. 2. No evidence of pulmonary embolus. 3.  Aortic Atherosclerosis (ICD10-I70.0). Nonvascular: 1. 1.8 x 1.4 cm spiculated left hilar mass, encasing the left lower lobe superior segmental bronchus at its origin, highly concerning for malignancy. This area appears hypermetabolic on recent PET scan. Bronchoscopy may be useful further evaluation. 2. 1.6 cm hyperdense mass along the posterior wall the bladder. Cystoscopy recommended for further evaluation. 3. Stable borderline enlarged bilateral inguinal lymph nodes, nonspecific. 4. Distal colonic diverticulosis without diverticulitis. Electronically Signed   By: Sharlet Salina M.D.   On: 01/30/2024 15:42   CT Head Wo Contrast Result Date: 01/30/2024 CLINICAL DATA:  Larey Seat yesterday, seizure, history of hematologic malignancy and lymphadenopathy EXAM: CT HEAD WITHOUT CONTRAST TECHNIQUE: Contiguous axial images were obtained from the base of the skull through the vertex without intravenous contrast. RADIATION DOSE REDUCTION: This exam was performed according to the departmental dose-optimization program which includes automated exposure control, adjustment of the mA and/or kV according  to patient size and/or use of iterative reconstruction technique. COMPARISON:  12/28/2023, 01/23/2024 FINDINGS: Brain: No acute infarct or hemorrhage. Lateral ventricles and midline structures are unremarkable. No acute extra-axial fluid collections. No mass effect. Vascular: No hyperdense vessel or unexpected calcification. Skull: Soft tissue nodularity along the outer table of the left parietal region of the calvarium is again noted, measuring up to 7 mm in thickness reference image 44/3, corresponding to hypermetabolic deposits on recent PET scan. Negative for fracture or focal lesion. Sinuses/Orbits: Enlargement of the left muscles of mastication is noted, measuring 4.1 x 2.2 cm reference image 11/3, previously measuring 3.8 x 1.8 cm. This was noted to be hypermetabolic on recent PET scan 01/23/2024. Stable left periauricular adenopathy, measuring up to 10 mm in short axis reference image 3/3, also hypermetabolic on recent PET scan. Paranasal sinuses are unremarkable. Other: None. IMPRESSION: 1. No acute infarct or hemorrhage. 2. Stable enlargement of the left muscles of mastication, subcutaneous soft tissue nodularity along the left parietal region of the calvarium, and left periauricular adenopathy, consistent with underlying malignancy as noted on recent PET scan. Electronically Signed   By: Sharlet Salina M.D.   On: 01/30/2024 15:28   DG Chest Portable 1 View Result Date: 01/30/2024 CLINICAL DATA:  Cough EXAM: PORTABLE CHEST 1 VIEW COMPARISON:  02/13/2019 FINDINGS: The heart size and mediastinal contours are within normal limits. Both lungs are clear. The visualized skeletal structures are unremarkable. IMPRESSION: No active disease. Electronically Signed   By: Duanne Guess D.O.   On: 01/30/2024 11:04   CT Lumbar Spine Wo Contrast Result Date: 01/30/2024 CLINICAL DATA:  Low back pain with cauda equina syndrome suspected EXAM: CT LUMBAR SPINE WITHOUT CONTRAST TECHNIQUE: Multidetector CT imaging of  the lumbar spine was performed without intravenous contrast administration. Multiplanar CT image reconstructions were also generated. RADIATION DOSE REDUCTION: This exam was performed according to the departmental dose-optimization program which includes automated exposure control, adjustment of the mA and/or kV according to patient size and/or use of iterative reconstruction technique. COMPARISON:  Lumbar MRI 01/11/2024 FINDINGS: Segmentation: 5 lumbar type vertebrae. Alignment: Normal. Vertebrae: No acute fracture or focal pathologic process. Paraspinal and other soft tissues: Negative. Disc levels: T12- L1: Spondylitic spurring.  No neural impingement. L1-L2: Disc space narrowing with ventral spondylitic spurring. No neural impingement. L2-L3: Disc space narrowing  with mild endplate and facet spurring. No neural compression. L3-L4: Disc space narrowing with endplate and facet spurring. Mild narrowing of the thecal sac. L4-L5: Disc narrowing with endplate and moderate facet spurring. Mild narrowing of the thecal sac and foramina. L5-S1:Disc space narrowing with endplate ridging. Degenerative facet spurring which is asymmetrically bulky on the right. IMPRESSION: 1. No acute finding. 2. 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 Electronically Signed   By: Tiburcio Pea M.D.   On: 01/30/2024 11:00         ASSESSMENT and PLAN:  1.  Left facial soft tissue mass: - I reviewed PET scan images (01/23/2024): Which showed hypermetabolic left facial mass beneath the zygomatic arch, hypermetabolic left level 2, level 3 and posterior triangle lymph nodes.  There is also left perihilar mass.  Less well-defined hypermetabolic tissue within the right parotid gland. - Reviewed CT angiogram chest/abdomen/pelvis images with the patient showing 1.8 x 1.4 cm left hilar mass.  1.6 cm hypodense mass along the posterior wall of the bladder. - Reviewed brain MRI: No  intracranial metastatic disease.  Soft tissue masses along the scalp on the left side seen. - Patient is scheduled for biopsy of the left facial mass on 02/13/2024. - I will message my office to see if we can move up his biopsy. - He will follow-up with me 5 to 7 days after the biopsy in the office.  All questions were answered. The patient knows to call the clinic with any problems, questions or concerns. We can certainly see the patient much sooner if necessary.    Doreatha Massed

## 2024-01-31 NOTE — TOC CM/SW Note (Signed)
Transition of Care [facility]) - Inpatient Brief Assessment   Patient Details  Name: JB DULWORTH MRN: [id number] Date of Birth: June 13, 1956  Transition of Care [facility]) CM/SW Contact:    Isabella Bowens, LCSWA Phone Number: 01/31/2024, 10:48 AM   Clinical Narrative:  Transition of Care Department [facility]) has reviewed patient and no TOC needs have been identified at this time. We will continue to monitor patient advancement through interdisciplinary progression rounds. If new patient transition needs arise, please place a TOC consult.  Transition of Care Asessment: Insurance and Status: Insurance coverage has been reviewed Patient has primary care physician: Yes Home environment has been reviewed: Single Family Home with 2 sons Prior level of function:: Independent Prior/Current Home Services: No current home services Social Drivers of Health Review: SDOH reviewed no interventions necessary Readmission risk has been reviewed: Yes Transition of care needs: no transition of care needs at this time

## 2024-01-31 NOTE — ED Notes (Signed)
ED TO INPATIENT HANDOFF REPORT  ED Nurse Name and Phone #: [phone]   S Name/Age/Gender Shawn Fleming 68 y.o. male Room/Bed: APA17/APA17  Code Status   Code Status: Full Code  Home/SNF/Other Home Patient oriented to: self, place, time, and situation Is this baseline? Yes   Triage Complete: Triage complete  Chief Complaint AMS (altered mental status) [R41.82]  Triage Note Pt arrived via POV from home c/o generalized back pain that began this morning. Pt denies injury, denies falling. Pt unable to describe the pain.   Pt reports the pain in his back is so bad, he is unable to wlak. Pt does endorse recent cough, and does have audible wheezing.    Allergies Allergies  Allergen Reactions   Prednisone Other (See Comments)    Unknown     Level of Care/Admitting Diagnosis ED Disposition     ED Disposition  Admit   Condition  --   Comment  Hospital Area: [facility] [[id number]]  Level of Care: Telemetry [5]  Covid Evaluation: Asymptomatic - no recent exposure (last 10 days) testing not required  Diagnosis: AMS (altered mental status) [[id number]]  Admitting Physician: Chiquita Loth  Attending Physician: Randol Kern, DAWOOD S [[id number]]          B Medical/Surgery History Past Medical History:  Diagnosis Date   AKI (acute kidney injury) (HCC) 07/29/2020   Arthritis    Diabetes (HCC)    GERD (gastroesophageal reflux disease)    History of kidney stones    Hypercholesteremia    Hypertension    Migraine    Sleep apnea    Past Surgical History:  Procedure Laterality Date   ADENOIDECTOMY     APPENDECTOMY     CENTRAL VENOUS CATHETER INSERTION Left 04/20/2013   Procedure: INSERTION CENTRAL LINE LEFT SUBCLAVIAN;  Surgeon: Fabio Bering, MD;  Location: [facility];  Service: General;  Laterality: Left;   INCISION AND DRAINAGE PERIRECTAL ABSCESS N/A 04/20/2013   Procedure: SHARP SURGICAL DEBRIDEMENT PERINEAL INFECTION;  Surgeon: Fabio Bering, MD;   Location: [facility];  Service: General;  Laterality: N/A;   KNEE SURGERY Right    arthroscopic   SHOULDER SURGERY Bilateral    removal of bone.   TONSILLECTOMY       A IV Location/Drains/Wounds Patient Lines/Drains/Airways Status     Active Line/Drains/Airways     Name Placement date Placement time Site Days   Peripheral IV 01/30/24 18 G 1.16" Left Antecubital 01/30/24  0924  Antecubital  1   Wound 06/08/13 Other (Comment) Buttocks Right;Left 06/08/13  --  Buttocks  3889            Intake/Output Last 24 hours  Intake/Output Summary (Last 24 hours) at 01/31/2024 0755 Last data filed at 01/31/2024 0702 Gross per 24 hour  Intake 2000.94 ml  Output 1500 ml  Net 500.94 ml    Labs/Imaging Results for orders placed or performed during the hospital encounter of 01/30/24 (from the past 48 hours)  Ammonia     Status: None   Collection Time: 01/30/24  9:19 AM  Result Value Ref Range   Ammonia 11 9 - 35 umol/L    Comment: Performed at [facility] [address] [place], [place] [address]  Ethanol     Status: None   Collection Time: 01/30/24  9:19 AM  Result Value Ref Range   Alcohol, Ethyl (B) <10 <10 mg/dL    Comment: (NOTE) Lowest detectable limit for serum alcohol is 10 mg/dL.  For medical purposes only. Performed at [facility], [address], [place], [place] [address]   Lactic acid, plasma     Status: None   Collection Time: 01/30/24  9:19 AM  Result Value Ref Range   Lactic Acid, Venous 1.9 0.5 - 1.9 mmol/L    Comment: Performed at [facility], [address], [place], [place] [address]  CBG monitoring, ED     Status: Abnormal   Collection Time: 01/30/24  9:22 AM  Result Value Ref Range   Glucose-Capillary 112 (H) 70 - 99 mg/dL    Comment: Glucose reference range applies only to samples taken after fasting for at least 8 hours.  CBC     Status: None   Collection Time: 01/30/24  9:24 AM  Result Value Ref Range   WBC 10.0 4.0 - 10.5 K/uL   RBC 4.38 4.22 - 5.81  MIL/uL   Hemoglobin 13.5 13.0 - 17.0 g/dL   HCT 09.8 11.9 - 14.7 %   MCV 90.0 80.0 - 100.0 fL   MCH 30.8 26.0 - 34.0 pg   MCHC 34.3 30.0 - 36.0 g/dL   RDW 82.9 56.2 - 13.0 %   Platelets 196 150 - 400 K/uL   nRBC 0.0 0.0 - 0.2 %    Comment: Performed at [facility], [address], [place], [place] [address]  Comprehensive metabolic panel     Status: Abnormal   Collection Time: 01/30/24  9:24 AM  Result Value Ref Range   Sodium 134 (L) 135 - 145 mmol/L   Potassium 4.9 3.5 - 5.1 mmol/L   Chloride 95 (L) 98 - 111 mmol/L   CO2 28 22 - 32 mmol/L   Glucose, Bld 124 (H) 70 - 99 mg/dL    Comment: Glucose reference range applies only to samples taken after fasting for at least 8 hours.   BUN 23 8 - 23 mg/dL   Creatinine, Ser 4.69 (H) 0.61 - 1.24 mg/dL   Calcium 9.4 8.9 - 62.9 mg/dL   Total Protein 7.8 6.5 - 8.1 g/dL   Albumin 4.2 3.5 - 5.0 g/dL   AST 26 15 - 41 U/L   ALT 18 0 - 44 U/L   Alkaline Phosphatase 61 38 - 126 U/L   Total Bilirubin 0.9 0.0 - 1.2 mg/dL   GFR, Estimated 36 (L) >60 mL/min    Comment: (NOTE) Calculated using the CKD-EPI Creatinine Equation (2021)    Anion gap 11 5 - 15    Comment: Performed at [facility], [address], [place], [place] [address]  Lipase, blood     Status: None   Collection Time: 01/30/24  9:24 AM  Result Value Ref Range   Lipase 23 11 - 51 U/L    Comment: Performed at [facility], [address], [place], [place] [address]  I-stat chem 8, ed     Status: Abnormal   Collection Time: 01/30/24  9:24 AM  Result Value Ref Range   Sodium 136 135 - 145 mmol/L   Potassium 4.7 3.5 - 5.1 mmol/L   Chloride 96 (L) 98 - 111 mmol/L   BUN 23 8 - 23 mg/dL   Creatinine, Ser 1.02 (H) 0.61 - 1.24 mg/dL   Glucose, Bld 725 (H) 70 - 99 mg/dL    Comment: Glucose reference range applies only to samples taken after fasting for at least 8 hours.   Calcium, Ion 1.15 1.15 - 1.40 mmol/L   TCO2 28 22 - 32 mmol/L   Hemoglobin  13.6 13.0 - 17.0 g/dL   HCT 13.0 86.5  - 78.4 %  Hemoglobin A1c     Status: Abnormal   Collection Time: 01/30/24  9:24 AM  Result Value Ref Range   Hgb A1c MFr Bld 6.6 (H) 4.8 - 5.6 %    Comment: (NOTE) Pre diabetes:          5.7%-6.4%  Diabetes:              >6.4%  Glycemic control for   <7.0% adults with diabetes    Mean Plasma Glucose 142.72 mg/dL    Comment: Performed at [facility], [address] [place], [place] [address]  Lactic acid, plasma     Status: None   Collection Time: 01/30/24 11:05 AM  Result Value Ref Range   Lactic Acid, Venous 0.9 0.5 - 1.9 mmol/L    Comment: Performed at [facility], [address] [place], [place] [address]  Blood gas, venous (at [facility] and [facility])     Status: Abnormal   Collection Time: 01/30/24 11:05 AM  Result Value Ref Range   pH, Ven 7.44 (H) 7.25 - 7.43   pCO2, Ven 51 44 - 60 mmHg   pO2, Ven 31 (LL) 32 - 45 mmHg    Comment: CRITICAL RESULT CALLED TO, READ BACK BY AND VERIFIED WITH: K EDWARDS RN [id number] K FORSYTH    Bicarbonate 34.6 (H) 20.0 - 28.0 mmol/L   Acid-Base Excess 8.8 (H) 0.0 - 2.0 mmol/L   O2 Saturation 54.6 %   Patient temperature 36.9    Collection site LEFT ANTECUBITAL    Drawn by Pat Kocher     Comment: Performed at [facility], [address] [place], [place] [address]  POC CBG, ED     Status: Abnormal   Collection Time: 01/30/24  9:02 PM  Result Value Ref Range   Glucose-Capillary 206 (H) 70 - 99 mg/dL    Comment: Glucose reference range applies only to samples taken after fasting for at least 8 hours.  Basic metabolic panel     Status: Abnormal   Collection Time: 01/31/24  2:54 AM  Result Value Ref Range   Sodium 138 135 - 145 mmol/L   Potassium 4.4 3.5 - 5.1 mmol/L   Chloride 103 98 - 111 mmol/L   CO2 27 22 - 32 mmol/L   Glucose, Bld 155 (H) 70 - 99 mg/dL    Comment: Glucose reference range applies only to samples taken after fasting for at least 8 hours.   BUN 21 8 - 23 mg/dL   Creatinine, Ser 1.02 (H) 0.61 - 1.24 mg/dL   Calcium 8.7  (L) 8.9 - 10.3 mg/dL   GFR, Estimated 54 (L) >60 mL/min    Comment: (NOTE) Calculated using the CKD-EPI Creatinine Equation (2021)    Anion gap 8 5 - 15    Comment: Performed at [facility], [address] [place], [place] [address]  CBC     Status: Abnormal   Collection Time: 01/31/24  2:54 AM  Result Value Ref Range   WBC 5.4 4.0 - 10.5 K/uL   RBC 4.03 (L) 4.22 - 5.81 MIL/uL   Hemoglobin 12.2 (L) 13.0 - 17.0 g/dL   HCT 64.4 (L) 03.4 - 74.2 %   MCV 89.8 80.0 - 100.0 fL   MCH 30.3 26.0 - 34.0 pg   MCHC 33.7 30.0 - 36.0 g/dL   RDW 59.5 63.8 - 75.6 %   Platelets 152 150 - 400  K/uL   nRBC 0.0 0.0 - 0.2 %    Comment: Performed at [facility], [address] [place], [place] [address]   MR Brain W and Wo Contrast Result Date: 01/30/2024 CLINICAL DATA:  Metastatic disease evaluation. Confusion. Fall. History of hematologic malignancy and lymphadenopathy. EXAM: MRI HEAD WITHOUT AND WITH CONTRAST TECHNIQUE: Multiplanar, multiecho pulse sequences of the brain and surrounding structures were obtained without and with intravenous contrast. CONTRAST:  10mL GADAVIST GADOBUTROL 1 MMOL/ML IV SOLN COMPARISON:  Head CT same day.  PET scan 01/23/2024 FINDINGS: Brain: Fusion imaging does not show any acute or subacute infarction. No focal abnormality affects the brainstem or cerebellum. Cerebral hemispheres show mild chronic small-vessel ischemic changes of the white matter. No cortical or large vessel territory infarction. No intracranial mass lesion, hemorrhage, hydrocephalus or extra-axial collection. No abnormal brain or leptomeningeal enhancement occurs. Vascular: Major vessels at the base of the brain show flow. Skull and upper cervical spine: No bone abnormality seen. Sinuses/Orbits: Mucosal inflammatory changes of the paranasal sinuses. Orbits negative. Other: Soft tissue masses of the scalp and the masticator space on the left as shown previously. IMPRESSION: 1. No acute or reversible intracranial  finding. No evidence of intracranial metastatic disease. Mild chronic small-vessel ischemic changes of the cerebral hemispheric white matter. 2. Soft tissue masses of the scalp and masticator space on the left as shown previously. Electronically Signed   By: Paulina Fusi M.D.   On: 01/30/2024 18:35   CT Angio Chest/Abd/Pel for Dissection W and/or Wo Contrast Result Date: 01/30/2024 CLINICAL DATA:  Larey Seat yesterday, seizure, low back pain, cough, history of hematologic malignancy and head and neck lymphadenopathy EXAM: CT ANGIOGRAPHY CHEST, ABDOMEN AND PELVIS TECHNIQUE: Non-contrast CT of the chest was initially obtained. Multidetector CT imaging through the chest, abdomen and pelvis was performed using the standard protocol during bolus administration of intravenous contrast. Multiplanar reconstructed images and MIPs were obtained and reviewed to evaluate the vascular anatomy. RADIATION DOSE REDUCTION: This exam was performed according to the departmental dose-optimization program which includes automated exposure control, adjustment of the mA and/or kV according to patient size and/or use of iterative reconstruction technique. CONTRAST:  75mL OMNIPAQUE IOHEXOL 350 MG/ML SOLN COMPARISON:  01/30/2024, 01/23/2024 FINDINGS: CTA CHEST FINDINGS Cardiovascular: No evidence of thoracic aortic aneurysm or dissection. The heart is unremarkable without pericardial effusion. There is adequate opacification of the central and segmental pulmonary arteries, with no filling defects or pulmonary emboli identified. Atherosclerosis of the aorta and coronary vasculature. Mediastinum/Nodes: No enlarged mediastinal, hilar, or axillary lymph nodes. Thyroid gland, trachea, and esophagus demonstrate no significant findings. Lungs/Pleura: 1.8 x 1.4 cm spiculated left hilar mass encasing the left lower lobe superior segment bronchus, reference image 60/6. This area appears hypermetabolic on recent PET scan, and is highly concerning for  malignancy. Bronchoscopy may be useful for further evaluation. No acute airspace disease, effusion, or pneumothorax. Central airways are patent. Musculoskeletal: No acute or destructive bony abnormalities. Reconstructed images demonstrate no additional findings. Review of the MIP images confirms the above findings. CTA ABDOMEN AND PELVIS FINDINGS VASCULAR Aorta: Normal caliber aorta without aneurysm, dissection, vasculitis or significant stenosis. Mild atherosclerosis. Celiac: Patent without evidence of aneurysm, dissection, vasculitis or significant stenosis. SMA: Patent without evidence of aneurysm, dissection, vasculitis or significant stenosis. Renals: Both renal arteries are patent without evidence of aneurysm, dissection, vasculitis, fibromuscular dysplasia or significant stenosis. Mild atherosclerosis within the proximal left renal artery. IMA: Patent without evidence of aneurysm, dissection, vasculitis or significant stenosis. Inflow: Patent without  evidence of aneurysm, dissection, vasculitis or significant stenosis. Veins: No obvious venous abnormality within the limitations of this arterial phase study. Review of the MIP images confirms the above findings. NON-VASCULAR Hepatobiliary: No focal liver abnormality is seen. No gallstones, gallbladder wall thickening, or biliary dilatation. Pancreas: Unremarkable. No pancreatic ductal dilatation or surrounding inflammatory changes. Spleen: Normal in size without focal abnormality. Adrenals/Urinary Tract: Kidneys enhance normally and symmetrically. No urinary tract calculi or obstructive uropathy. The adrenals are unremarkable. There is a 1.6 x 1.4 x 1.2 cm hyperdense mass along the posterior aspect of the bladder just superior to the right UVJ, reference image 164/4. Cystoscopy is recommended for further evaluation. Stomach/Bowel: No bowel obstruction or ileus. The appendix is surgically absent. Diverticulosis of the descending and sigmoid colon without  evidence of diverticulitis. No bowel wall thickening or inflammatory change. Lymphatic: Stable borderline enlarged lymph nodes within the bilateral inguinal regions. No other pathologic adenopathy. Reproductive: Prostate is unremarkable. Other: No free fluid or free intraperitoneal gas. Stable fat containing left inguinal hernia. No bowel herniation. Musculoskeletal: No acute or destructive bony abnormalities. Reconstructed images demonstrate no additional findings. Review of the MIP images confirms the above findings. IMPRESSION: Vascular: 1. No evidence of thoracoabdominal aortic aneurysm or dissection. 2. No evidence of pulmonary embolus. 3.  Aortic Atherosclerosis (ICD10-I70.0). Nonvascular: 1. 1.8 x 1.4 cm spiculated left hilar mass, encasing the left lower lobe superior segmental bronchus at its origin, highly concerning for malignancy. This area appears hypermetabolic on recent PET scan. Bronchoscopy may be useful further evaluation. 2. 1.6 cm hyperdense mass along the posterior wall the bladder. Cystoscopy recommended for further evaluation. 3. Stable borderline enlarged bilateral inguinal lymph nodes, nonspecific. 4. Distal colonic diverticulosis without diverticulitis. Electronically Signed   By: Sharlet Salina M.D.   On: 01/30/2024 15:42   CT Head Wo Contrast Result Date: 01/30/2024 CLINICAL DATA:  Larey Seat yesterday, seizure, history of hematologic malignancy and lymphadenopathy EXAM: CT HEAD WITHOUT CONTRAST TECHNIQUE: Contiguous axial images were obtained from the base of the skull through the vertex without intravenous contrast. RADIATION DOSE REDUCTION: This exam was performed according to the departmental dose-optimization program which includes automated exposure control, adjustment of the mA and/or kV according to patient size and/or use of iterative reconstruction technique. COMPARISON:  12/28/2023, 01/23/2024 FINDINGS: Brain: No acute infarct or hemorrhage. Lateral ventricles and midline  structures are unremarkable. No acute extra-axial fluid collections. No mass effect. Vascular: No hyperdense vessel or unexpected calcification. Skull: Soft tissue nodularity along the outer table of the left parietal region of the calvarium is again noted, measuring up to 7 mm in thickness reference image 44/3, corresponding to hypermetabolic deposits on recent PET scan. Negative for fracture or focal lesion. Sinuses/Orbits: Enlargement of the left muscles of mastication is noted, measuring 4.1 x 2.2 cm reference image 11/3, previously measuring 3.8 x 1.8 cm. This was noted to be hypermetabolic on recent PET scan 01/23/2024. Stable left periauricular adenopathy, measuring up to 10 mm in short axis reference image 3/3, also hypermetabolic on recent PET scan. Paranasal sinuses are unremarkable. Other: None. IMPRESSION: 1. No acute infarct or hemorrhage. 2. Stable enlargement of the left muscles of mastication, subcutaneous soft tissue nodularity along the left parietal region of the calvarium, and left periauricular adenopathy, consistent with underlying malignancy as noted on recent PET scan. Electronically Signed   By: Sharlet Salina M.D.   On: 01/30/2024 15:28   DG Chest Portable 1 View Result Date: 01/30/2024 CLINICAL DATA:  Cough EXAM: PORTABLE CHEST 1 VIEW  COMPARISON:  02/13/2019 FINDINGS: The heart size and mediastinal contours are within normal limits. Both lungs are clear. The visualized skeletal structures are unremarkable. IMPRESSION: No active disease. Electronically Signed   By: Duanne Guess D.O.   On: 01/30/2024 11:04   CT Lumbar Spine Wo Contrast Result Date: 01/30/2024 CLINICAL DATA:  Low back pain with cauda equina syndrome suspected EXAM: CT LUMBAR SPINE WITHOUT CONTRAST TECHNIQUE: Multidetector CT imaging of the lumbar spine was performed without intravenous contrast administration. Multiplanar CT image reconstructions were also generated. RADIATION DOSE REDUCTION: This exam was performed  according to the departmental dose-optimization program which includes automated exposure control, adjustment of the mA and/or kV according to patient size and/or use of iterative reconstruction technique. COMPARISON:  Lumbar MRI 01/11/2024 FINDINGS: Segmentation: 5 lumbar type vertebrae. Alignment: Normal. Vertebrae: No acute fracture or focal pathologic process. Paraspinal and other soft tissues: Negative. Disc levels: T12- L1: Spondylitic spurring.  No neural impingement. L1-L2: Disc space narrowing with ventral spondylitic spurring. No neural impingement. L2-L3: Disc space narrowing with mild endplate and facet spurring. No neural compression. L3-L4: Disc space narrowing with endplate and facet spurring. Mild narrowing of the thecal sac. L4-L5: Disc narrowing with endplate and moderate facet spurring. Mild narrowing of the thecal sac and foramina. L5-S1:Disc space narrowing with endplate ridging. Degenerative facet spurring which is asymmetrically bulky on the right. IMPRESSION: 1. No acute finding. 2. 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 Electronically Signed   By: Tiburcio Pea M.D.   On: 01/30/2024 11:00    Pending Labs Unresulted Labs (From admission, onward)     Start     Ordered   01/31/24 0500  HIV Antibody (routine testing w rflx)  (HIV Antibody (Routine testing w reflex) panel)  Tomorrow morning,   R        01/30/24 1945   01/30/24 1110  Rapid urine drug screen (hospital performed)  ONCE - STAT,   STAT        01/30/24 1109   01/30/24 0925  Urinalysis, Routine w reflex microscopic -Urine, Clean Catch  Once,   URGENT       Question:  Specimen Source  Answer:  Urine, Clean Catch   01/30/24 0925            Vitals/Pain Today's Vitals   01/31/24 0400 01/31/24 0500 01/31/24 0600 01/31/24 0700  BP: 114/71 116/69 107/70 125/81  Pulse: 76 80 75 80  Resp:  15  18  Temp:      TempSrc:      SpO2: 98% 97% 97% 99%  Weight:       Height:      PainSc:        Isolation Precautions No active isolations  Medications Medications  pantoprazole (PROTONIX) EC tablet 40 mg (40 mg Oral Given 01/30/24 2042)  alfuzosin (UROXATRAL) 24 hr tablet 10 mg (has no administration in time range)  apixaban (ELIQUIS) tablet 5 mg (5 mg Oral Given 01/30/24 2134)  oxyCODONE (Oxy IR/ROXICODONE) immediate release tablet 10 mg (10 mg Oral Given 01/30/24 2042)  insulin aspart (novoLOG) injection 0-15 Units (has no administration in time range)  insulin aspart (novoLOG) injection 0-5 Units (2 Units Subcutaneous Given 01/30/24 2137)  insulin glargine-yfgn (SEMGLEE) injection 15 Units (15 Units Subcutaneous Given 01/30/24 2103)  0.9 %  sodium chloride infusion ( Intravenous New Bag/Given 01/31/24 0646)  hydrocortisone sodium succinate (SOLU-CORTEF) 100 MG injection 100 mg (100 mg Intravenous Given 01/30/24  2039)  ALPRAZolam (XANAX) tablet 0.25 mg (has no administration in time range)  ipratropium-albuterol (DUONEB) 0.5-2.5 (3) MG/3ML nebulizer solution 3 mL (3 mLs Nebulization Given 01/30/24 1052)  ipratropium-albuterol (DUONEB) 0.5-2.5 (3) MG/3ML nebulizer solution 3 mL (3 mLs Nebulization Given 01/30/24 1348)  sodium chloride 0.9 % bolus 1,000 mL (0 mLs Intravenous Stopped 01/30/24 1350)  iohexol (OMNIPAQUE) 350 MG/ML injection 75 mL (75 mLs Intravenous Contrast Given 01/30/24 1304)  gadobutrol (GADAVIST) 1 MMOL/ML injection 10 mL (10 mLs Intravenous Contrast Given 01/30/24 1558)    Mobility walks     Focused Assessments    R Recommendations: See Admitting Provider Note  Report given to:   Additional Notes:

## 2024-02-01 ENCOUNTER — Ambulatory Visit: Payer: Medicare Other | Admitting: "Endocrinology

## 2024-02-01 DIAGNOSIS — J101 Influenza due to other identified influenza virus with other respiratory manifestations: Secondary | ICD-10-CM

## 2024-02-01 LAB — BASIC METABOLIC PANEL
Anion gap: 7 (ref 5–15)
BUN: 18 mg/dL (ref 8–23)
CO2: 25 mmol/L (ref 22–32)
Calcium: 8.2 mg/dL — ABNORMAL LOW (ref 8.9–10.3)
Chloride: 106 mmol/L (ref 98–111)
Creatinine, Ser: 1.15 mg/dL (ref 0.61–1.24)
GFR, Estimated: >60 mL/min (ref >60)
Glucose, Bld: 110 mg/dL — ABNORMAL HIGH (ref 70–99)
Potassium: 3.6 mmol/L (ref 3.5–5.1)
Sodium: 138 mmol/L (ref 135–145)

## 2024-02-01 LAB — RESP PANEL BY RT-PCR (RSV, FLU A&B, COVID)  RVPGX2
Influenza A by PCR: POSITIVE — AB
Influenza B by PCR: NEGATIVE
Resp Syncytial Virus by PCR: NEGATIVE
SARS Coronavirus 2 by RT PCR: NEGATIVE

## 2024-02-01 LAB — GLUCOSE, CAPILLARY
Glucose-Capillary: 86 mg/dL (ref 70–99)
Glucose-Capillary: 114 mg/dL — ABNORMAL HIGH (ref 70–99)

## 2024-02-01 MED ORDER — OSELTAMIVIR PHOSPHATE 75 MG PO CAPS
75.0000 mg | ORAL_CAPSULE | Freq: Two times a day (BID) | ORAL | Status: DC
  Administered 2024-02-01: 75 mg via ORAL
  Filled 2024-02-01: qty 1

## 2024-02-01 MED ORDER — GUAIFENESIN-DM 100-10 MG/5ML PO SYRP: 10.0000 mL | ORAL_SOLUTION | ORAL | Status: DC | PRN

## 2024-02-01 MED ORDER — BENZONATATE 100 MG PO CAPS
200.0000 mg | ORAL_CAPSULE | Freq: Three times a day (TID) | ORAL | Status: DC
  Administered 2024-02-01: 200 mg via ORAL
  Filled 2024-02-01: qty 2

## 2024-02-01 MED ORDER — GUAIFENESIN-DM 100-10 MG/5ML PO SYRP
5.0000 mL | ORAL_SOLUTION | ORAL | Status: DC | PRN
  Administered 2024-02-01 (×3): 5 mL via ORAL
  Filled 2024-02-01 (×3): qty 5

## 2024-02-01 MED ORDER — BENZONATATE 200 MG PO CAPS: 200.0000 mg | ORAL_CAPSULE | Freq: Three times a day (TID) | ORAL | 0 refills | Status: DC

## 2024-02-01 MED ORDER — OSELTAMIVIR PHOSPHATE 75 MG PO CAPS: 75.0000 mg | ORAL_CAPSULE | Freq: Two times a day (BID) | ORAL | 0 refills | Status: AC

## 2024-02-01 NOTE — Discharge Instructions (Signed)
Please be cautious with your insulin doses at home.  Your blood sugars have been in the normal range.

## 2024-02-01 NOTE — Discharge Summary (Signed)
Physician Discharge Summary   Patient: Shawn Fleming MRN: [id number] DOB: 1956-03-16  Admit date:     01/30/2024  Discharge date: 02/01/24  Discharge Physician: MDALA-GAUSI, Gwenette Greet   PCP: Benita Stabile, MD   Recommendations at discharge:    Follow up with Oncology as outpatient. Adjust home insulin dosing as needed.  Discharge Diagnoses: Principal Problem:   AMS (altered mental status) Active Problems:   AKI (acute kidney injury) (HCC)   Essential hypertension   Diabetes mellitus (HCC)   GERD (gastroesophageal reflux disease)   Adrenal insufficiency (HCC)   Chronic kidney disease, stage 3a (HCC)   Chronic low back pain   Lumbar spondylosis   Morbid obesity (HCC)   Altered mental status   Acute encephalopathy   Hilar mass   Facial mass  Resolved Problems:   * No resolved hospital problems. [facility] Course  68 year old man with PMH of HTN, HLD, migraine, GERD, OSA, T2DM, recently diagnosed left facial mass with cervical lymphadenopathy, currently following up with Dr. Ellin Saba and ENT who presented to the ED with altered mental status, history of fall. Patient reports an episode of urinary incontinence preceding his symptoms. ED workup revealed no acute intracranial findings but patient seems to have a new left hilar mass and urinary bladder mass. He also presented with AKI.   He tested positive for Influenza A on the day of discharge.   The rest of the hospital course is in problem-based format below:  AMS -With transient episode of altered mental status, confusion, had urine incontinence -ED discussed with neurology who recommended admission for routine EEG and neuroconsult in the morning to rule out seizures -No acute findings on MRI brain   -EEG was not concerning for seizures.  - Given eventual positive Influenza test, seems possible patient may have had a severe viral prodrome.   Influenza A infection -The patient developed cough, chest congestion  while hospitalized. -He tested positive for influenza A. -He was started on Tamiflu and discharged home on this medication.   Left facial mass, with lymphadenopathy New finding of left hilar mass New finding of urinary bladder mass -Concern for malignancy -Patient is following with Dr. Ellin Saba and an [facility] ENT specialist. Scheduled for facial biopsy 02/13/2024.   Patient had a PET scan on 01/23/2024. -Dr. Ellin Saba was informed of patient's admission and patient is being scheduled for biopsy at an earlier date.     AKI on CKD -Patient presented with creatinine of 2.3 from baseline of approximately 1.5. -Likely prerenal due to poor p.o. intake. -AKI resolved with IV fluids and creatinine was 1.15 on the day of discharge.   History of DVT -Continue with Eliquis   Diabetes mellitus, type II -Patient reports he is on 50 units of Lantus daily. -Blood sugars were in the normal range while hospitalized. -Patient was advised to be cautious with resuming his home medications.  Neuropathy Chronic pain syndrome Chronic lower back pain -Home medications were continued.   Hyperlipidemia -Home medications were resumed.   Adrenal insufficiency -He was treated with stress dose steroids on admission and then transitioned to his usual home regimen.   GERD - Continued with PPI   Obesity class II -Body mass index is 38.22 kg/m. -On Zepbound         Consultants: Oncology Procedures performed: EEG  Disposition: Home Diet recommendation:  Discharge Diet Orders (From admission, onward)     Start     Ordered   02/01/24 0000  Diet -  low sodium heart healthy        02/01/24 1223           Carb modified diet DISCHARGE MEDICATION: Allergies as of 02/01/2024       Reactions   Prednisone Other (See Comments)   Unknown         Medication List     TAKE these medications    ALPRAZolam 0.5 MG tablet Commonly known as: XANAX Take 0.5 mg by mouth 2 (two) times  daily.   apixaban 5 MG Tabs tablet Commonly known as: Eliquis Take 1 tablet (5 mg total) by mouth 2 (two) times daily. Please start around August 31, 2020 after you complete the initial starter pack   benzonatate 200 MG capsule Commonly known as: TESSALON Take 1 capsule (200 mg total) by mouth 3 (three) times daily. For cough   Fish Oil 1200 MG Caps Take 1 capsule by mouth daily.   furosemide 40 MG tablet Commonly known as: LASIX Take 40 mg by mouth daily.   gabapentin 600 MG tablet Commonly known as: NEURONTIN Take 600 mg by mouth 3 (three) times daily.   guaiFENesin-dextromethorphan 100-10 MG/5ML syrup Commonly known as: ROBITUSSIN DM Take 10 mLs by mouth every 4 (four) hours as needed for cough (chest congestion).   hydrocortisone 10 MG tablet Commonly known as: CORTEF TAKE 1 TABLET DAILY AT 8 A.M. AND 1 TABLET DAILY AT NOON   insulin glargine 100 UNIT/ML Solostar Pen Commonly known as: LANTUS Inject 50 Units into the skin daily.   Mounjaro 12.5 MG/0.5ML Pen Generic drug: tirzepatide Inject 12.5 mg into the skin once a week.   MULTIVITAMIN PO Take 1 tablet by mouth daily.   oseltamivir 75 MG capsule Commonly known as: TAMIFLU Take 1 capsule (75 mg total) by mouth 2 (two) times daily for 9 doses.   Oxycodone HCl 10 MG Tabs Take 10 mg by mouth 4 (four) times daily.   pantoprazole 40 MG tablet Commonly known as: PROTONIX Take 40 mg by mouth daily.   potassium chloride 10 MEQ tablet Commonly known as: KLOR-CON Take 10 mEq by mouth daily.   rosuvastatin 10 MG tablet Commonly known as: CRESTOR Take 10 mg by mouth daily.   Vitamin D3 125 MCG (5000 UT) Caps Take 2 capsules by mouth daily.        Discharge Exam: Filed Weights   01/30/24 0909 01/31/24 0833  Weight: 101 kg 100.5 kg   Physical Exam on Day of Discharge   General: Alert, cheerful, oriented X3. Left-sided facial swelling Oral cavity: moist mucous membranes  Neck: supple  Chest:  rhonchi CVS: S1,S2 RRR. No murmurs  Abd: No distention, soft, non-tender. No masses palpable  Extr: No edema    Condition at discharge: stable  The results of significant diagnostics from this hospitalization (including imaging, microbiology, ancillary and laboratory) are listed below for reference.   Imaging Studies: EEG adult Result Date: 01/31/2024 Charlsie Quest, MD     01/31/2024  5:13 PM Patient Name: BRYEN HINDERMAN MRN: [id number] Epilepsy Attending: Charlsie Quest Referring Physician/Provider: Gloris Manchester, MD Date: 01/31/2024 Duration: 22.11 mins Patient history: 68yo m with transient episode of altered mental status, confusion, had urine incontinence. EEG to evaluate for seizure Level of alertness: Awake AEDs during EEG study: None Technical aspects: This EEG study was done with scalp electrodes positioned according to the 10-20 International system of electrode placement. Electrical activity was reviewed with band pass filter of 1-70Hz , sensitivity of 7 uV/mm, display  speed of 21mm/sec with a 60Hz  notched filter applied as appropriate. EEG data were recorded continuously and digitally stored.  Video monitoring was available and reviewed as appropriate. Description: The posterior dominant rhythm consists of 9 Hz activity of moderate voltage (25-35 uV) seen predominantly in posterior head regions, symmetric and reactive to eye opening and eye closing. Hyperventilation and photic stimulation were not performed.   IMPRESSION: This study is within normal limits. No seizures or epileptiform discharges were seen throughout the recording. A normal interictal EEG does not exclude the diagnosis of epilepsy. Charlsie Quest   MR Brain W and Wo Contrast Result Date: 01/30/2024 CLINICAL DATA:  Metastatic disease evaluation. Confusion. Fall. History of hematologic malignancy and lymphadenopathy. EXAM: MRI HEAD WITHOUT AND WITH CONTRAST TECHNIQUE: Multiplanar, multiecho pulse sequences of the brain and  surrounding structures were obtained without and with intravenous contrast. CONTRAST:  10mL GADAVIST GADOBUTROL 1 MMOL/ML IV SOLN COMPARISON:  Head CT same day.  PET scan 01/23/2024 FINDINGS: Brain: Fusion imaging does not show any acute or subacute infarction. No focal abnormality affects the brainstem or cerebellum. Cerebral hemispheres show mild chronic small-vessel ischemic changes of the white matter. No cortical or large vessel territory infarction. No intracranial mass lesion, hemorrhage, hydrocephalus or extra-axial collection. No abnormal brain or leptomeningeal enhancement occurs. Vascular: Major vessels at the base of the brain show flow. Skull and upper cervical spine: No bone abnormality seen. Sinuses/Orbits: Mucosal inflammatory changes of the paranasal sinuses. Orbits negative. Other: Soft tissue masses of the scalp and the masticator space on the left as shown previously. IMPRESSION: 1. No acute or reversible intracranial finding. No evidence of intracranial metastatic disease. Mild chronic small-vessel ischemic changes of the cerebral hemispheric white matter. 2. Soft tissue masses of the scalp and masticator space on the left as shown previously. Electronically Signed   By: Paulina Fusi M.D.   On: 01/30/2024 18:35   CT Angio Chest/Abd/Pel for Dissection W and/or Wo Contrast Result Date: 01/30/2024 CLINICAL DATA:  Larey Seat yesterday, seizure, low back pain, cough, history of hematologic malignancy and head and neck lymphadenopathy EXAM: CT ANGIOGRAPHY CHEST, ABDOMEN AND PELVIS TECHNIQUE: Non-contrast CT of the chest was initially obtained. Multidetector CT imaging through the chest, abdomen and pelvis was performed using the standard protocol during bolus administration of intravenous contrast. Multiplanar reconstructed images and MIPs were obtained and reviewed to evaluate the vascular anatomy. RADIATION DOSE REDUCTION: This exam was performed according to the departmental dose-optimization program  which includes automated exposure control, adjustment of the mA and/or kV according to patient size and/or use of iterative reconstruction technique. CONTRAST:  75mL OMNIPAQUE IOHEXOL 350 MG/ML SOLN COMPARISON:  01/30/2024, 01/23/2024 FINDINGS: CTA CHEST FINDINGS Cardiovascular: No evidence of thoracic aortic aneurysm or dissection. The heart is unremarkable without pericardial effusion. There is adequate opacification of the central and segmental pulmonary arteries, with no filling defects or pulmonary emboli identified. Atherosclerosis of the aorta and coronary vasculature. Mediastinum/Nodes: No enlarged mediastinal, hilar, or axillary lymph nodes. Thyroid gland, trachea, and esophagus demonstrate no significant findings. Lungs/Pleura: 1.8 x 1.4 cm spiculated left hilar mass encasing the left lower lobe superior segment bronchus, reference image 60/6. This area appears hypermetabolic on recent PET scan, and is highly concerning for malignancy. Bronchoscopy may be useful for further evaluation. No acute airspace disease, effusion, or pneumothorax. Central airways are patent. Musculoskeletal: No acute or destructive bony abnormalities. Reconstructed images demonstrate no additional findings. Review of the MIP images confirms the above findings. CTA ABDOMEN AND PELVIS FINDINGS VASCULAR  Aorta: Normal caliber aorta without aneurysm, dissection, vasculitis or significant stenosis. Mild atherosclerosis. Celiac: Patent without evidence of aneurysm, dissection, vasculitis or significant stenosis. SMA: Patent without evidence of aneurysm, dissection, vasculitis or significant stenosis. Renals: Both renal arteries are patent without evidence of aneurysm, dissection, vasculitis, fibromuscular dysplasia or significant stenosis. Mild atherosclerosis within the proximal left renal artery. IMA: Patent without evidence of aneurysm, dissection, vasculitis or significant stenosis. Inflow: Patent without evidence of aneurysm,  dissection, vasculitis or significant stenosis. Veins: No obvious venous abnormality within the limitations of this arterial phase study. Review of the MIP images confirms the above findings. NON-VASCULAR Hepatobiliary: No focal liver abnormality is seen. No gallstones, gallbladder wall thickening, or biliary dilatation. Pancreas: Unremarkable. No pancreatic ductal dilatation or surrounding inflammatory changes. Spleen: Normal in size without focal abnormality. Adrenals/Urinary Tract: Kidneys enhance normally and symmetrically. No urinary tract calculi or obstructive uropathy. The adrenals are unremarkable. There is a 1.6 x 1.4 x 1.2 cm hyperdense mass along the posterior aspect of the bladder just superior to the right UVJ, reference image 164/4. Cystoscopy is recommended for further evaluation. Stomach/Bowel: No bowel obstruction or ileus. The appendix is surgically absent. Diverticulosis of the descending and sigmoid colon without evidence of diverticulitis. No bowel wall thickening or inflammatory change. Lymphatic: Stable borderline enlarged lymph nodes within the bilateral inguinal regions. No other pathologic adenopathy. Reproductive: Prostate is unremarkable. Other: No free fluid or free intraperitoneal gas. Stable fat containing left inguinal hernia. No bowel herniation. Musculoskeletal: No acute or destructive bony abnormalities. Reconstructed images demonstrate no additional findings. Review of the MIP images confirms the above findings. IMPRESSION: Vascular: 1. No evidence of thoracoabdominal aortic aneurysm or dissection. 2. No evidence of pulmonary embolus. 3.  Aortic Atherosclerosis (ICD10-I70.0). Nonvascular: 1. 1.8 x 1.4 cm spiculated left hilar mass, encasing the left lower lobe superior segmental bronchus at its origin, highly concerning for malignancy. This area appears hypermetabolic on recent PET scan. Bronchoscopy may be useful further evaluation. 2. 1.6 cm hyperdense mass along the posterior  wall the bladder. Cystoscopy recommended for further evaluation. 3. Stable borderline enlarged bilateral inguinal lymph nodes, nonspecific. 4. Distal colonic diverticulosis without diverticulitis. Electronically Signed   By: Sharlet Salina M.D.   On: 01/30/2024 15:42   CT Head Wo Contrast Result Date: 01/30/2024 CLINICAL DATA:  Larey Seat yesterday, seizure, history of hematologic malignancy and lymphadenopathy EXAM: CT HEAD WITHOUT CONTRAST TECHNIQUE: Contiguous axial images were obtained from the base of the skull through the vertex without intravenous contrast. RADIATION DOSE REDUCTION: This exam was performed according to the departmental dose-optimization program which includes automated exposure control, adjustment of the mA and/or kV according to patient size and/or use of iterative reconstruction technique. COMPARISON:  12/28/2023, 01/23/2024 FINDINGS: Brain: No acute infarct or hemorrhage. Lateral ventricles and midline structures are unremarkable. No acute extra-axial fluid collections. No mass effect. Vascular: No hyperdense vessel or unexpected calcification. Skull: Soft tissue nodularity along the outer table of the left parietal region of the calvarium is again noted, measuring up to 7 mm in thickness reference image 44/3, corresponding to hypermetabolic deposits on recent PET scan. Negative for fracture or focal lesion. Sinuses/Orbits: Enlargement of the left muscles of mastication is noted, measuring 4.1 x 2.2 cm reference image 11/3, previously measuring 3.8 x 1.8 cm. This was noted to be hypermetabolic on recent PET scan 01/23/2024. Stable left periauricular adenopathy, measuring up to 10 mm in short axis reference image 3/3, also hypermetabolic on recent PET scan. Paranasal sinuses are unremarkable. Other: None. IMPRESSION:  1. No acute infarct or hemorrhage. 2. Stable enlargement of the left muscles of mastication, subcutaneous soft tissue nodularity along the left parietal region of the calvarium,  and left periauricular adenopathy, consistent with underlying malignancy as noted on recent PET scan. Electronically Signed   By: Sharlet Salina M.D.   On: 01/30/2024 15:28   DG Chest Portable 1 View Result Date: 01/30/2024 CLINICAL DATA:  Cough EXAM: PORTABLE CHEST 1 VIEW COMPARISON:  02/13/2019 FINDINGS: The heart size and mediastinal contours are within normal limits. Both lungs are clear. The visualized skeletal structures are unremarkable. IMPRESSION: No active disease. Electronically Signed   By: Duanne Guess D.O.   On: 01/30/2024 11:04   CT Lumbar Spine Wo Contrast Result Date: 01/30/2024 CLINICAL DATA:  Low back pain with cauda equina syndrome suspected EXAM: CT LUMBAR SPINE WITHOUT CONTRAST TECHNIQUE: Multidetector CT imaging of the lumbar spine was performed without intravenous contrast administration. Multiplanar CT image reconstructions were also generated. RADIATION DOSE REDUCTION: This exam was performed according to the departmental dose-optimization program which includes automated exposure control, adjustment of the mA and/or kV according to patient size and/or use of iterative reconstruction technique. COMPARISON:  Lumbar MRI 01/11/2024 FINDINGS: Segmentation: 5 lumbar type vertebrae. Alignment: Normal. Vertebrae: No acute fracture or focal pathologic process. Paraspinal and other soft tissues: Negative. Disc levels: T12- L1: Spondylitic spurring.  No neural impingement. L1-L2: Disc space narrowing with ventral spondylitic spurring. No neural impingement. L2-L3: Disc space narrowing with mild endplate and facet spurring. No neural compression. L3-L4: Disc space narrowing with endplate and facet spurring. Mild narrowing of the thecal sac. L4-L5: Disc narrowing with endplate and moderate facet spurring. Mild narrowing of the thecal sac and foramina. L5-S1:Disc space narrowing with endplate ridging. Degenerative facet spurring which is asymmetrically bulky on the right. IMPRESSION: 1. No  acute finding. 2. 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 Electronically Signed   By: Tiburcio Pea M.D.   On: 01/30/2024 11:00   NM PET Image Initial (PI) Whole Body Result Date: 01/24/2024 CLINICAL DATA:  Initial treatment strategy for lymphadenopathy head neck. EXAM: NUCLEAR MEDICINE PET WHOLE BODY TECHNIQUE: 10.8 mCi F-18 FDG was injected intravenously. Full-ring PET imaging was performed from the head to foot after the radiotracer. CT data was obtained and used for attenuation correction and anatomic localization. Fasting blood glucose: 115 mg/dl COMPARISON:  None Available. FINDINGS: Mediastinal blood pool activity: SUV max 2.9 HEAD/NECK: Hypermetabolic enlargement of the LEFT temporalis muscle just beneath the zygomatic arch with SUV max equal 14.4 on image 33. asymmetric muscle thickening measures 19 mm on image 33. Additionally there are multiple LEFT level 2 and level 3 hypermetabolic lymph nodes. Example node just inferior to the tail the parotid gland on the LEFT with SUV max equal 11.9 on image 43. This nodule measures 21 mm. More inferiorly level 3 node on the LEFT measuring 10 mm on image 58 SUV max equal 10.6. Additionally, there are posterior triangle nodes on the LEFT measuring 10 mm each with SUV max equal 9.0 (image 52). Less well-defined hypermetabolic tissue within the RIGHT parotid gland measuring 11 mm on image 45) with SUV max equal 6.6. Incidental CT findings: none CHEST: No hypermetabolic mediastinal or hilar nodes. No suspicious pulmonary nodules on the CT scan. Incidental CT findings: none ABDOMEN/PELVIS: No abnormal hypermetabolic activity within the liver, pancreas, adrenal glands, or spleen. No hypermetabolic lymph nodes in the abdomen or pelvis. Moderate hypermetabolic inguinal nodes are  normal morphology with low metabolic activity (SUV max equal 5.9). Incidental CT findings: none SKELETON: No focal hypermetabolic  activity to suggest skeletal metastasis. Incidental CT findings: none EXTREMITIES: No abnormal hypermetabolic activity in the lower extremities. Incidental CT findings: none IMPRESSION: 1. Hypermetabolic enlargement of the LEFT muscle of mastication just beneath the zygomatic arch. 2. Hypermetabolic LEFT level II, level III and LEFT posterior triangle lymph nodes . 3. Less well-defined hypermetabolic tissue within the RIGHT parotid gland. 4. No evidence of metastatic disease in the chest abdomen or pelvis. 5. Moderately hypermetabolic inguinal nodes with normal morphology are indeterminate. Favor reactive. Electronically Signed   By: Genevive Bi M.D.   On: 01/24/2024 10:46   MR LUMBAR SPINE WO CONTRAST Result Date: 01/22/2024 CLINICAL DATA:  Low back pain with bilateral radiculopathy EXAM: MRI LUMBAR SPINE WITHOUT CONTRAST TECHNIQUE: Multiplanar, multisequence MR imaging of the lumbar spine was performed. No intravenous contrast was administered. COMPARISON:  09/10/2020 FINDINGS: Segmentation:  Standard. Alignment:  Trace retrolisthesis at L2-3. Vertebrae: No fracture, evidence of discitis, or bone lesion. Reactive subchondral marrow edema associated with the right L4-5 and L5-S1 facet joints. Conus medullaris and cauda equina: Conus extends to the L2 level. Conus and cauda equina appear normal. Paraspinal and other soft tissues: Colonic diverticulosis. No acute findings. Disc levels: T12-L1: Unremarkable. L1-L2: Minimal disc bulge without foraminal or canal stenosis. Unchanged. L2-L3: Mild broad-based disc bulge and mild bilateral facet arthropathy. No canal stenosis. Mild bilateral foraminal stenosis. Findings have progressed from prior. L3-L4: Annular disc bulge with mild-to-moderate bilateral facet arthropathy. Mild canal stenosis and mild bilateral foraminal stenosis. Findings have progressed from prior. L4-L5: Annular disc bulge with posterior annular fissure. Mild-to-moderate facet arthropathy. No  significant canal stenosis. Mild-to-moderate bilateral foraminal stenosis. No significant interval progression. L5-S1: Disc bulge and endplate spurring, eccentric to the right. Severe right-sided facet arthropathy. No canal stenosis. Moderate right and mild left foraminal stenosis. No significant interval progression. IMPRESSION: 1. Multilevel lumbar spondylosis, mildly progressed since 2021. 2. Mild canal stenosis at L3-4. 3. Mild-to-moderate bilateral foraminal stenosis at L4-5. 4. 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 5. Reactive subchondral marrow edema associated with the right L4-5 and L5-S1 facet joints, which may contribute to low back pain. Electronically Signed   By: Duanne Guess D.O.   On: 01/22/2024 11:37    Microbiology: Results for orders placed or performed during the hospital encounter of 01/30/24  Resp panel by RT-PCR (RSV, Flu A&B, Covid) Anterior Nasal Swab     Status: Abnormal   Collection Time: 02/01/24 10:14 AM   Specimen: Anterior Nasal Swab  Result Value Ref Range Status   SARS Coronavirus 2 by RT PCR NEGATIVE NEGATIVE Final    Comment: (NOTE) SARS-CoV-2 target nucleic acids are NOT DETECTED.  The SARS-CoV-2 RNA is generally detectable in upper respiratory specimens during the acute phase of infection. The lowest concentration of SARS-CoV-2 viral copies this assay can detect is 138 copies/mL. A negative result does not preclude SARS-Cov-2 infection and should not be used as the sole basis for treatment or other patient management decisions. A negative result may occur with  improper specimen collection/handling, submission of specimen other than nasopharyngeal swab, presence of viral mutation(s) within the areas targeted by this assay, and inadequate number of viral copies(<138 copies/mL). A negative result must be combined with clinical observations, patient history, and epidemiological information. The expected result is Negative.  Fact  Sheet for Patients:  BloggerCourse.com  Fact Sheet for Healthcare Providers:  SeriousBroker.it  This test is no t yet approved or cleared by the [place] and  has been authorized for detection and/or diagnosis of SARS-CoV-2 by FDA under an Emergency Use Authorization (EUA). This EUA will remain  in effect (meaning this test can be used) for the duration of the COVID-19 declaration under Section 564(b)(1) of the Act, 21 U.S.C.section 360bbb-3(b)(1), unless the authorization is terminated  or revoked sooner.       Influenza A by PCR POSITIVE (A) NEGATIVE Final   Influenza B by PCR NEGATIVE NEGATIVE Final    Comment: (NOTE) The Xpert Xpress SARS-CoV-2/FLU/RSV plus assay is intended as an aid in the diagnosis of influenza from Nasopharyngeal swab specimens and should not be used as a sole basis for treatment. Nasal washings and aspirates are unacceptable for Xpert Xpress SARS-CoV-2/FLU/RSV testing.  Fact Sheet for Patients: BloggerCourse.com  Fact Sheet for Healthcare Providers: SeriousBroker.it  This test is not yet approved or cleared by the [place] FDA and has been authorized for detection and/or diagnosis of SARS-CoV-2 by FDA under an Emergency Use Authorization (EUA). This EUA will remain in effect (meaning this test can be used) for the duration of the COVID-19 declaration under Section 564(b)(1) of the Act, 21 U.S.C. section 360bbb-3(b)(1), unless the authorization is terminated or revoked.     Resp Syncytial Virus by PCR NEGATIVE NEGATIVE Final    Comment: (NOTE) Fact Sheet for Patients: BloggerCourse.com  Fact Sheet for Healthcare Providers: SeriousBroker.it  This test is not yet approved or cleared by the [place] FDA and has been authorized for detection and/or diagnosis of SARS-CoV-2 by FDA  under an Emergency Use Authorization (EUA). This EUA will remain in effect (meaning this test can be used) for the duration of the COVID-19 declaration under Section 564(b)(1) of the Act, 21 U.S.C. section 360bbb-3(b)(1), unless the authorization is terminated or revoked.  Performed at [facility], [address] [place], [place] [address]     Labs: CBC: Recent Labs  Lab 01/30/24 0924 01/31/24 0254  WBC 10.0 5.4  HGB 13.5  13.6 12.2*  HCT 39.4  40.0 36.2*  MCV 90.0 89.8  PLT 196 152   Basic Metabolic Panel: Recent Labs  Lab 01/30/24 0924 01/31/24 0254 02/01/24 0459  NA 134*  136 138 138  K 4.9  4.7 4.4 3.6  CL 95*  96* 103 106  CO2 28 27 25   GLUCOSE 124*  121* 155* 110*  BUN 23  23 21 18   CREATININE 1.99*  2.30* 1.42* 1.15  CALCIUM 9.4 8.7* 8.2*   Liver Function Tests: Recent Labs  Lab 01/30/24 0924  AST 26  ALT 18  ALKPHOS 61  BILITOT 0.9  PROT 7.8  ALBUMIN 4.2   CBG: Recent Labs  Lab 01/31/24 1135 01/31/24 1637 01/31/24 2130 02/01/24 0728 02/01/24 1115  GLUCAP 163* 152* 96 86 114*    Discharge time spent: greater than 30 minutes.  Signed: MDALA-GAUSI, Gwenette Greet, MD [facility] 02/01/2024

## 2024-02-01 NOTE — Plan of Care (Signed)
  Problem: Coping: Goal: Ability to adjust to condition or change in health will improve Outcome: Not Met (add Reason)   Problem: Fluid Volume: Goal: Ability to maintain a balanced intake and output will improve Outcome: Not Met (add Reason)   Problem: Health Behavior/Discharge Planning: Goal: Ability to identify and utilize available resources and services will improve Outcome: Not Met (add Reason) Goal: Ability to manage health-related needs will improve Outcome: Not Met (add Reason)

## 2024-02-04 ENCOUNTER — Telehealth: Payer: Self-pay

## 2024-02-04 ENCOUNTER — Telehealth: Payer: Self-pay | Admitting: "Endocrinology

## 2024-02-04 ENCOUNTER — Encounter: Payer: Self-pay | Admitting: "Endocrinology

## 2024-02-04 ENCOUNTER — Ambulatory Visit (INDEPENDENT_AMBULATORY_CARE_PROVIDER_SITE_OTHER): Payer: Medicare Other | Admitting: "Endocrinology

## 2024-02-04 VITALS — BP 112/76 | HR 108 | Ht 64.0 in | Wt 221.4 lb

## 2024-02-04 DIAGNOSIS — E2740 Unspecified adrenocortical insufficiency: Secondary | ICD-10-CM

## 2024-02-04 DIAGNOSIS — E782 Mixed hyperlipidemia: Secondary | ICD-10-CM | POA: Diagnosis not present

## 2024-02-04 NOTE — Telephone Encounter (Signed)
Patient made aware that his $50 fee was waived due to hospitalization.

## 2024-02-04 NOTE — Progress Notes (Signed)
02/04/2024, 4:22 PM  Endocrinology follow-up note   Subjective:    Patient ID: Shawn UMSCHEID, male    DOB: Oct 24, 1956.  Shawn Fleming is being seen in follow-up for adrenal insufficiency.  He was also following up for type 2 DM, but he would like to address his diabetes with his PCP.  Patient is currently on workup for hypermetabolic , suspicious mass lesion on left temporal area.  PCP:   Benita Stabile, MD.   Past Medical History:  Diagnosis Date   AKI (acute kidney injury) (HCC) 07/29/2020   Arthritis    Diabetes (HCC)    GERD (gastroesophageal reflux disease)    History of kidney stones    Hypercholesteremia    Hypertension    Migraine    Sleep apnea     Past Surgical History:  Procedure Laterality Date   ADENOIDECTOMY     APPENDECTOMY     CENTRAL VENOUS CATHETER INSERTION Left 04/20/2013   Procedure: INSERTION CENTRAL LINE LEFT SUBCLAVIAN;  Surgeon: Fabio Bering, MD;  Location: [facility];  Service: General;  Laterality: Left;   INCISION AND DRAINAGE PERIRECTAL ABSCESS N/A 04/20/2013   Procedure: SHARP SURGICAL DEBRIDEMENT PERINEAL INFECTION;  Surgeon: Fabio Bering, MD;  Location: [facility];  Service: General;  Laterality: N/A;   KNEE SURGERY Right    arthroscopic   SHOULDER SURGERY Bilateral    removal of bone.   TONSILLECTOMY      Social History   Socioeconomic History   Marital status: Divorced    Spouse name: Not on file   Number of children: 6   Years of education: Not on file   Highest education level: Not on file  Occupational History   Occupation: Engineer, materials  Tobacco Use   Smoking status: Former    Current packs/day: 0.50    Average packs/day: 0.5 packs/day for 23.0 years (11.5 ttl pk-yrs)    Types: Cigarettes   Smokeless tobacco: Never  Vaping Use   Vaping status: Former  Substance and Sexual Activity   Alcohol use: Yes    Comment: rare   Drug use: No   Sexual  activity: Not Currently    Birth control/protection: None  Other Topics Concern   Not on file  Social History Narrative   Not on file   Social Drivers of Health   Financial Resource Strain: Not on file  Food Insecurity: No Food Insecurity (01/30/2024)   Hunger Vital Sign    Worried About Running Out of Food in the Last Year: Never true    Ran Out of Food in the Last Year: Never true  Transportation Needs: No Transportation Needs (01/31/2024)   PRAPARE - Administrator, Civil Service (Medical): No    Lack of Transportation (Non-Medical): No  Physical Activity: Not on file  Stress: Not on file  Social Connections: Patient Declined (01/31/2024)   Social Connection and Isolation Panel [NHANES]    Frequency of Communication with Friends and Family: Patient declined    Frequency of Social Gatherings with Friends and Family: Patient declined    Attends Religious Services: Patient declined    Database administrator or Organizations: Patient declined  Attends Banker Meetings: Patient declined    Marital Status: Patient declined    Family History  Problem Relation Age of Onset   Cancer Mother    Dementia Father    Colon cancer Neg Hx    Rectal cancer Neg Hx    Stomach cancer Neg Hx    Esophageal cancer Neg Hx     Outpatient Encounter Medications as of 02/04/2024  Medication Sig   ALPRAZolam (XANAX) 0.5 MG tablet Take 0.5 mg by mouth 2 (two) times daily.   apixaban (ELIQUIS) 5 MG TABS tablet Take 1 tablet (5 mg total) by mouth 2 (two) times daily. Please start around August 31, 2020 after you complete the initial starter pack   benzonatate (TESSALON) 200 MG capsule Take 1 capsule (200 mg total) by mouth 3 (three) times daily. For cough   Cholecalciferol (VITAMIN D3) 5000 units CAPS Take 2 capsules by mouth daily.    furosemide (LASIX) 40 MG tablet Take 40 mg by mouth daily.   gabapentin (NEURONTIN) 600 MG tablet Take 600 mg by mouth 3 (three) times daily.    guaiFENesin-dextromethorphan (ROBITUSSIN DM) 100-10 MG/5ML syrup Take 10 mLs by mouth every 4 (four) hours as needed for cough (chest congestion).   hydrocortisone (CORTEF) 10 MG tablet TAKE 1 TABLET DAILY AT 8 A.M. AND 1 TABLET DAILY AT NOON   insulin glargine (LANTUS) 100 UNIT/ML Solostar Pen Inject 50 Units into the skin daily.   Multiple Vitamins-Minerals (MULTIVITAMIN PO) Take 1 tablet by mouth daily.   Omega-3 Fatty Acids (FISH OIL) 1200 MG CAPS Take 1 capsule by mouth daily.   oseltamivir (TAMIFLU) 75 MG capsule Take 1 capsule (75 mg total) by mouth 2 (two) times daily for 9 doses.   Oxycodone HCl 10 MG TABS Take 10 mg by mouth 4 (four) times daily.   pantoprazole (PROTONIX) 40 MG tablet Take 40 mg by mouth daily.   potassium chloride (KLOR-CON) 10 MEQ tablet Take 10 mEq by mouth daily.   rosuvastatin (CRESTOR) 10 MG tablet Take 10 mg by mouth daily.   tirzepatide (MOUNJARO) 12.5 MG/0.5ML Pen Inject 12.5 mg into the skin once a week.   No facility-administered encounter medications on file as of 02/04/2024.    ALLERGIES: Allergies  Allergen Reactions   Prednisone Other (See Comments)    Unknown     VACCINATION STATUS: There is no immunization history for the selected administration types on file for this patient.  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  In the interim, he was switched to dexamethasone briefly so that he can undergo ACTH stimulation test which was significant for primary adrenal insufficiency-see below.  He remains on hydrocortisone 10 mg p.o. twice daily.  He is not on mineralocorticoid replacement. He also has type 2 diabetes and hyperlipidemia following up with his PCP.  His most recent A1c was said to be 6.7%.   Hyperlipidemia This is a chronic problem. The current episode started more than 1 year ago. The problem is controlled. Exacerbating diseases include diabetes and obesity. Pertinent negatives include no  myalgias. Current antihyperlipidemic treatment includes statins. Risk factors for coronary artery disease include diabetes mellitus, dyslipidemia, family history, hypertension, male sex, obesity and a sedentary lifestyle.        Objective:       02/04/2024    1:06 PM 02/01/2024    5:36 AM 01/31/2024    9:30 PM  Vitals with BMI  Height 5\' 4"   Weight 221 lbs 6 oz    BMI 37.98    Systolic 112 125 295  Diastolic 76 78 76  Pulse 108 78 87    BP 112/76   Pulse (!) 108   Ht 5\' 4"  (1.626 m)   Wt 221 lb 6.4 oz (100.4 kg)   BMI 38.00 kg/m   Wt Readings from Last 3 Encounters:  02/04/24 221 lb 6.4 oz (100.4 kg)  01/31/24 221 lb 9 oz (100.5 kg)  01/15/24 220 lb 14.4 oz (100.2 kg)        CMP ( most recent) CMP     Component Value Date/Time   NA 138 02/01/2024 0459   NA 143 01/22/2024 1307   K 3.6 02/01/2024 0459   CL 106 02/01/2024 0459   CO2 25 02/01/2024 0459   GLUCOSE 110 (H) 02/01/2024 0459   BUN 18 02/01/2024 0459   BUN 17 01/22/2024 1307   CREATININE 1.15 02/01/2024 0459   CALCIUM 8.2 (L) 02/01/2024 0459   PROT 7.8 01/30/2024 0924   PROT 7.0 01/22/2024 1307   ALBUMIN 4.2 01/30/2024 0924   ALBUMIN 4.5 01/22/2024 1307   AST 26 01/30/2024 0924   ALT 18 01/30/2024 0924   ALKPHOS 61 01/30/2024 0924   BILITOT 0.9 01/30/2024 0924   BILITOT 0.3 01/22/2024 1307   GFRNONAA >60 02/01/2024 0459   GFRAA 59 (L) 08/01/2020 0758     Diabetic Labs (most recent): Lab Results  Component Value Date   HGBA1C 6.6 (H) 01/30/2024   HGBA1C 7.0 06/14/2023   HGBA1C 7.1 08/08/2022     Lab Results  Component Value Date   TSH 2.130 01/22/2024   TSH 3.490 12/16/2021   TSH 0.622 07/29/2020   TSH 2.456 04/30/2013   FREET4 1.44 01/22/2024   FREET4 1.36 12/16/2021    Lipid Panel     Component Value Date/Time   CHOL 102 08/08/2022 0000   TRIG 147 08/08/2022 0000   HDL 31 (A) 08/08/2022 0000   LDLCALC 46 08/08/2022 0000      Assessment & Plan:   1) Adrenal  insufficiency  During the time of diagnosis, he was found to have profoundly low basal cortisol at 1.8.  ACTH stimulation test showed inadequate adrenal response with 12.2 at 30 minutes, 9.3 at 60 minutes.  More recently, he was switched briefly to dexamethasone to repeat ACTH stimulation test test which confirmed similar findings: Baseline 0.4, 30 minutes cortisol 6.7, 60 minutes cortisol 9.  Etiology of his adrenal insufficiency is not scheduled yet.  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    This establishes his adrenal insufficiency and I had a long discussion with him about the need for steroids replacement for life.  Sick day steroids rule was discussed with him that he can double his doses during days of severe distress, surgical procedure or severe illness.  He is advised to wear a medical alert depicting his diagnosis of adrenal insufficiency. -He is advised to continue hydrocortisone 10 mg p.o. daily at 8 AM and 10 mg p.o. daily at noon.  At this time, he will not need mineralocorticoid replacement.   2. 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  He wishes and manages his diabetes with his PCP. He is currently on Lantus 50 units nightly, Mounjaro 12.5 mg p.o. subcutaneously weekly.  - Lauris Poag has currently controlled symptomatic type 2 DM since  68 years of age. 3) Lipids/Hyperlipidemia: His recent LDL is 46.    He is advised to continue Crestor 10 mg p.o. nightly.  Side effects and precautions discussed with him.    4) Chronic Care/Health Maintenance:  -he  is on and Statin medications and  is encouraged to initiate and continue to follow up with Ophthalmology, Dentist,  Podiatrist at least yearly or according to recommendations, and advised to   stay away from smoking. I have recommended yearly flu vaccine and pneumonia vaccine at least every 5  years; moderate intensity exercise for up to 150 minutes weekly; and  sleep for at least 7 hours a day.  -He is advised to maintain close follow-up with his oncology team in light of the fact that his being worked up for suspicious head and neck mass lesion.  His recent ABI was negative for PAD in April 2021.  This study will be repeated in April 2026 or sooner if needed.   - he is  advised to maintain close follow up with Benita Stabile, MD for primary care needs, as well as his other providers for optimal and coordinated care.  I spent  22  minutes in the care of the patient today including review of labs from Thyroid Function, CMP, and other relevant labs ; imaging/biopsy records (current and previous including abstractions from other facilities); face-to-face time discussing  his lab results and symptoms, medications doses, his options of short and long term treatment based on the latest standards of care / guidelines;   and documenting the encounter.  Lauris Poag  participated in the discussions, expressed understanding, and voiced agreement with the above plans.  All questions were answered to his satisfaction. he is encouraged to contact clinic should he have any questions or concerns prior to his return visit.    Follow up plan: - Return in about 6 months (around 08/03/2024) for F/U with Pre-visit Labs.  Marquis Lunch, MD [facility] [address] [place], [place] [address] Phone: [phone]  Fax: [phone]    02/04/2024, 4:22 PM  This note was partially dictated with voice recognition software. Similar sounding words can be transcribed inadequately or may not  be corrected upon review.

## 2024-02-04 NOTE — Transitions of Care (Post Inpatient/ED Visit) (Signed)
   02/04/2024  Name: Shawn Fleming MRN: [id number] DOB: 1956-01-13  Today's TOC FU Call Status: Today's TOC FU Call Status:: Unsuccessful Call (1st Attempt) Unsuccessful Call (1st Attempt) Date: 02/04/24  Attempted to reach the patient regarding the most recent Inpatient/ED visit. Patient was called in an Outreach attempt to offer VBCI  30-day TOC program. Pt is eligible for program due to potential risk for readmission and/or high utilization. Unfortunately, I was not able to speak with the patient in regards to recent hospital discharge 02/01/24 AMS -With transient episode of altered mental status, confusion, He tested positive for Influenza A on the day of discharge.    Left a HIPAA compliant phone message message for patient including VBCI CM contact information with request for a call back in regard to recent hospital discharge  Follow Up Plan: Additional outreach attempts will be made to reach the patient to complete the Transitions of Care (Post Inpatient/ED visit) call.   Susa Loffler , BSN, RN [facility]   VBCI-Population Health RN Care Manager Direct Dial [phone]  Fax: [phone] Website: [email or web address]

## 2024-02-05 ENCOUNTER — Telehealth: Payer: Self-pay

## 2024-02-05 NOTE — Transitions of Care (Post Inpatient/ED Visit) (Signed)
02/05/2024  Name: Shawn Fleming MRN: [id number] DOB: 02/11/1956  Today's TOC FU Call Status: Today's TOC FU Call Status:: Successful TOC FU Call Completed Unsuccessful Call (1st Attempt) Date: 02/04/24 [facility] FU Call Complete Date: 02/05/24 Patient's Name and Date of Birth confirmed.  Transition Care Management Follow-up Telephone Call Date of Discharge: 02/01/24 [facility] Type of Discharge: Inpatient Admission Primary Inpatient Discharge Diagnosis:: AMS -With transient episode of altered mental status, confusion,positive Influenza test, seems possible patient may have had a severe viral prodrome. How have you been since you were released from the hospital?: Better Any questions or concerns?: No  Items Reviewed: Did you receive and understand the discharge instructions provided?: Yes Medications obtained,verified, and reconciled?: Yes (Medications Reviewed) Any new allergies since your discharge?: No Dietary orders reviewed?: Yes Type of Diet Ordered:: Reg Heart Healthy Do you have support at home?: Yes People in Home: significant other Ship broker and friends)  Medications Reviewed Today: Medications Reviewed Today     Reviewed by Johnnette Barrios, RN (Registered Nurse) on 02/05/24 at 1200  Med List Status: <None>   Medication Order Taking? Sig Documenting Provider Last Dose Status Informant  ALPRAZolam (XANAX) 0.5 MG tablet [id number] Yes Take 0.5 mg by mouth 2 (two) times daily. [provider] Taking Active Self  apixaban (ELIQUIS) 5 MG TABS tablet [id number] Yes Take 1 tablet (5 mg total) by mouth 2 (two) times daily. Please start around August 31, 2020 after you complete the initial starter pack Emokpae, Courage, MD Taking Active Self  benzonatate (TESSALON) 200 MG capsule [id number] Yes Take 1 capsule (200 mg total) by mouth 3 (three) times daily. For cough Mdala-Gausi, Gwenette Greet, MD Taking Active   Cholecalciferol (VITAMIN D3) 5000 units  CAPS [id number] Yes Take 2 capsules by mouth daily.  [provider] Taking Active Self  furosemide (LASIX) 40 MG tablet [id number] Yes Take 40 mg by mouth daily. [provider] Taking Active Self  gabapentin (NEURONTIN) 600 MG tablet [id number] Yes Take 600 mg by mouth 3 (three) times daily. [provider] Taking Active Self  guaiFENesin-dextromethorphan (ROBITUSSIN DM) 100-10 MG/5ML syrup [id number] Yes Take 10 mLs by mouth every 4 (four) hours as needed for cough (chest congestion). Mdala-Gausi, Gwenette Greet, MD Taking Active   hydrocortisone (CORTEF) 10 MG tablet [id number] Yes TAKE 1 TABLET DAILY AT 8 A.M. AND 1 TABLET DAILY AT Cleon Gustin, MD Taking Active Self  insulin glargine (LANTUS) 100 UNIT/ML Solostar Pen [id number] Yes Inject 50 Units into the skin daily. [provider] Taking Active Self           Med Note (WARD, ANGELICA G   Wed Jan 30, 2024  6:12 PM)    Multiple Vitamins-Minerals (MULTIVITAMIN PO) [id number] Yes Take 1 tablet by mouth daily. [provider] Taking Active Self  Omega-3 Fatty Acids (FISH OIL) 1200 MG CAPS [id number] Yes Take 1 capsule by mouth daily. [provider] Taking Active Self  oseltamivir (TAMIFLU) 75 MG capsule [id number] Yes Take 1 capsule (75 mg total) by mouth 2 (two) times daily for 9 doses. Mdala-Gausi, Gwenette Greet, MD Taking Active   Oxycodone HCl 10 MG TABS [id number] Yes Take 10 mg by mouth 4 (four) times daily. [provider] Taking Active Self  pantoprazole (PROTONIX) 40 MG tablet [id number] Yes Take 40 mg by mouth daily. [provider] Taking Active Self  potassium chloride (KLOR-CON) 10 MEQ tablet [id number] Yes Take 10 mEq by mouth daily. [provider] Taking Active Self  rosuvastatin (CRESTOR) 10 MG tablet [id number] Yes Take 10 mg by mouth daily. [provider] Taking Active Self  tirzepatide [facility]) 12.5 MG/0.5ML Pen [id number] Yes Inject  12.5 mg into the skin once a week.  Taking Active Self          Medication reconciliation / review completed based on most recent discharge summary and EHR medication list. Confirmed patient is taking all newly prescribed medications as instructed (any discrepancies are noted in review section)   Patient / Caregiver is aware of any changes to and / or  any dosage adjustments to medication regimen. Patient/ Caregiver denies questions at this time and reports no barriers to medication adherence.   Home Care and Equipment/Supplies: Were Home Health Services Ordered?: No Any new equipment or medical supplies ordered?: No  Functional Questionnaire: Do you need assistance with bathing/showering or dressing?: No Do you need assistance with meal preparation?: No Do you need assistance with eating?: No Do you have difficulty maintaining continence: No Do you need assistance with getting out of bed/getting out of a chair/moving?: No Do you have difficulty managing or taking your medications?: No  Follow up appointments reviewed: PCP Follow-up appointment confirmed?: Yes Date of PCP follow-up appointment?: 02/08/24 Follow-up Provider: Prudencio Pair Follow-up appointment confirmed?: Yes Date of Specialist follow-up appointment?: 02/04/24 Follow-Up Specialty Provider:: Endocrinologist, 2/28 BX 2/20 Do you need transportation to your follow-up appointment?: No Do you understand care options if your condition(s) worsen?: Yes-patient verbalized understanding  SDOH Interventions Today    Flowsheet Row Most Recent Value  SDOH Interventions   Food Insecurity Interventions Intervention Not Indicated  Housing Interventions Intervention Not Indicated  Transportation Interventions Intervention Not Indicated, Patient Resources (Friends/Family)  Utilities Interventions Intervention Not Indicated      Interventions Today    Flowsheet Row Most Recent Value  Chronic Disease   Chronic  disease during today's visit Other  General Interventions   General Interventions Discussed/Reviewed General Interventions Discussed, General Interventions Reviewed, Doctor Visits  Doctor Visits Discussed/Reviewed PCP, Specialist, Annual Wellness Visits, Doctor Visits Reviewed, Doctor Visits Discussed  PCP/Specialist Visits Compliance with follow-up visit  Exercise Interventions   Exercise Discussed/Reviewed Physical Activity, Exercise Reviewed  Physical Activity Discussed/Reviewed Physical Activity Reviewed  Nutrition Interventions   Nutrition Discussed/Reviewed Nutrition Reviewed  Pharmacy Interventions   Pharmacy Dicussed/Reviewed Medications and their functions  Safety Interventions   Safety Discussed/Reviewed Safety Reviewed       Benefits reviewed  Based on current information and Insurance plan -Reviewed benefits accessible to patient, including details about eligibility options for care and  available value based care options  if any areas of needs were identified.  Reviewed patient/  caregiver's ability to access and / or  ability with navigating the benefits system..Amb Referral made if indicted , refer to orders section of note for details   Reviewed goals for care Patient/ Caregiver  verbalizes understanding of instructions and care plan provided. Patient / Caregiver was encouraged to make informed decisions about their care, actively participate in managing their health condition, and implement lifestyle changes as needed to promote independence and self-management of health care. There were no reported  barriers to care.   TOC program  Patient is at high risk for readmission and/or has history of  high utilization  Discussed VBCI  TOC program and weekly calls to patient to assess condition/status, medication management  and provide support/education as indicated . Patient/ Caregiver voiced understanding and declined enrollment in the 30-day  TOC Program.   He states he is doing  well at home He is following his usual routine He has follow-up visits scheduled, he is managing  his medications without difficulty. He is able to drive and has support at home    The patient has been provided with contact information for the care management team and has been advised to call with any health-related questions or concerns. Follow up as indicated with Care Team , or sooner should any new problems arise.   Susa Loffler , BSN, RN [facility]   VBCI-Population Health RN Care Manager Direct Dial [phone]  Fax: [phone] Website: [email or web address]

## 2024-02-06 ENCOUNTER — Other Ambulatory Visit: Payer: Self-pay | Admitting: Student

## 2024-02-06 DIAGNOSIS — R220 Localized swelling, mass and lump, head: Secondary | ICD-10-CM

## 2024-02-06 NOTE — H&P (Incomplete)
Chief Complaint: Patient was seen in consultation today for left parotid gland mass with consideration for biopsy.  Referring Provider(s): Dr. Ernestene Kiel, MD  Supervising Physician: Marliss Coots  Patient Status: [facility] - Out-pt  Patient is Full Code  History of Present Illness: Shawn Fleming is a 68 y.o. male with PMHx notable for diabetes, HTN, HLD, OSA, migraines, GERD, arthritis, nephrolithiasis, and AKI.  Per Dr. Marice Potter progress not on 01/15/24: Left facial soft tissue mass: - Left facial mass for the last 4-5 months, gradually increasing.  No hearing loss/vision changes. - No fevers/night sweats.  57 pound weight loss in the last 2 years, has been on Mounjaro for the last 1 and half year. - Ultrasound (11/30/2023): Complex 3.6 x 1.1 x 2.9 cm cystic structure in front of the left ear. - CT head (12/27/2022): Spiculated soft tissue mass around the left zygomatic arch measuring 2.2 cm in thickness and 4.3 cm in length.  Multifocal lobulated and infiltrative appearing soft tissue along the deep posterior left scalp convexity might be in part abnormal left level 5 lymph nodes.  PET scan on 01/23/24: MPRESSION: 1. Hypermetabolic enlargement of the LEFT muscle of mastication just beneath the zygomatic arch. 2. Hypermetabolic LEFT level II, level III and LEFT posterior triangle lymph nodes . 3. Less well-defined hypermetabolic tissue within the RIGHT parotid gland. 4. No evidence of metastatic disease in the chest abdomen or pelvis. 5. Moderately hypermetabolic inguinal nodes with normal morphology are indeterminate. Favor reactive.   Interventional Radiology was requested for parotid gland biopsy. Request was reviewed and approved by Dr. Deanne Coffer. Patient is scheduled for same in IR today.  All labs and medications are within acceptable parameters. No pertinent allergies. Patient has been NPO since midnight.    Currently without any significant complaints. Patient alert and  laying in bed,calm. Denies any fevers, headache, chest pain, SOB, cough, abdominal pain, nausea, vomiting or bleeding.      Past Medical History:  Diagnosis Date   AKI (acute kidney injury) (HCC) 07/29/2020   Arthritis    Diabetes (HCC)    GERD (gastroesophageal reflux disease)    History of kidney stones    Hypercholesteremia    Hypertension    Migraine    Sleep apnea     Past Surgical History:  Procedure Laterality Date   ADENOIDECTOMY     APPENDECTOMY     CENTRAL VENOUS CATHETER INSERTION Left 04/20/2013   Procedure: INSERTION CENTRAL LINE LEFT SUBCLAVIAN;  Surgeon: Fabio Bering, MD;  Location: [facility];  Service: General;  Laterality: Left;   INCISION AND DRAINAGE PERIRECTAL ABSCESS N/A 04/20/2013   Procedure: SHARP SURGICAL DEBRIDEMENT PERINEAL INFECTION;  Surgeon: Fabio Bering, MD;  Location: [facility];  Service: General;  Laterality: N/A;   KNEE SURGERY Right    arthroscopic   SHOULDER SURGERY Bilateral    removal of bone.   TONSILLECTOMY      Allergies: Prednisone  Medications: Prior to Admission medications   Medication Sig Start Date End Date Taking? Authorizing Provider  ALPRAZolam Prudy Feeler) 0.5 MG tablet Take 0.5 mg by mouth 2 (two) times daily.    [provider]  apixaban (ELIQUIS) 5 MG TABS tablet Take 1 tablet (5 mg total) by mouth 2 (two) times daily. Please start around August 31, 2020 after you complete the initial starter pack 08/31/20   Shon Hale, MD  benzonatate (TESSALON) 200 MG capsule Take 1 capsule (200 mg total) by mouth 3 (three) times daily. For cough  02/01/24   Mdala-Gausi, Gwenette Greet, MD  Cholecalciferol (VITAMIN D3) 5000 units CAPS Take 2 capsules by mouth daily.     [provider]  furosemide (LASIX) 40 MG tablet Take 40 mg by mouth daily. 07/27/22   [provider]  gabapentin (NEURONTIN) 600 MG tablet Take 600 mg by mouth 3 (three) times daily. 01/02/24   [provider]   guaiFENesin-dextromethorphan (ROBITUSSIN DM) 100-10 MG/5ML syrup Take 10 mLs by mouth every 4 (four) hours as needed for cough (chest congestion). 02/01/24   Mdala-Gausi, Gwenette Greet, MD  hydrocortisone (CORTEF) 10 MG tablet TAKE 1 TABLET DAILY AT 8 A.M. AND 1 TABLET DAILY AT NOON 01/17/24   Roma Kayser, MD  insulin glargine (LANTUS) 100 UNIT/ML Solostar Pen Inject 50 Units into the skin daily.    [provider]  Multiple Vitamins-Minerals (MULTIVITAMIN PO) Take 1 tablet by mouth daily.    [provider]  Omega-3 Fatty Acids (FISH OIL) 1200 MG CAPS Take 1 capsule by mouth daily.    [provider]  oseltamivir (TAMIFLU) 75 MG capsule Take 1 capsule (75 mg total) by mouth 2 (two) times daily for 9 doses. 02/01/24 02/06/24  Mdala-Gausi, Gwenette Greet, MD  Oxycodone HCl 10 MG TABS Take 10 mg by mouth 4 (four) times daily. 07/05/20   [provider]  pantoprazole (PROTONIX) 40 MG tablet Take 40 mg by mouth daily. 03/21/20   [provider]  potassium chloride (KLOR-CON) 10 MEQ tablet Take 10 mEq by mouth daily. 07/11/22   [provider]  rosuvastatin (CRESTOR) 10 MG tablet Take 10 mg by mouth daily.    [provider]  tirzepatide Greggory Keen) 12.5 MG/0.5ML Pen Inject 12.5 mg into the skin once a week. 07/23/23        Family History  Problem Relation Age of Onset   Cancer Mother    Dementia Father    Colon cancer Neg Hx    Rectal cancer Neg Hx    Stomach cancer Neg Hx    Esophageal cancer Neg Hx     Social History   Socioeconomic History   Marital status: Divorced    Spouse name: Not on file   Number of children: 6   Years of education: Not on file   Highest education level: Not on file  Occupational History   Occupation: Engineer, materials  Tobacco Use   Smoking status: Former    Current packs/day: 0.50    Average packs/day: 0.5 packs/day for 23.0 years (11.5 ttl pk-yrs)    Types: Cigarettes   Smokeless tobacco:  Never  Vaping Use   Vaping status: Former  Substance and Sexual Activity   Alcohol use: Yes    Comment: rare   Drug use: No   Sexual activity: Not Currently    Birth control/protection: None  Other Topics Concern   Not on file  Social History Narrative   Not on file   Social Drivers of Health   Financial Resource Strain: Not on file  Food Insecurity: No Food Insecurity (02/05/2024)   Hunger Vital Sign    Worried About Running Out of Food in the Last Year: Never true    Ran Out of Food in the Last Year: Never true  Transportation Needs: No Transportation Needs (02/05/2024)   PRAPARE - Administrator, Civil Service (Medical): No    Lack of Transportation (Non-Medical): No  Physical Activity: Not on file  Stress: Not on file  Social Connections: Patient Declined (  01/31/2024)   Social Connection and Isolation Panel [NHANES]    Frequency of Communication with Friends and Family: Patient declined    Frequency of Social Gatherings with Friends and Family: Patient declined    Attends Religious Services: Patient declined    Database administrator or Organizations: Patient declined    Attends Banker Meetings: Patient declined    Marital Status: Patient declined     Review of Systems: A 12 point ROS discussed and pertinent positives are indicated in the HPI above.  All other systems are negative.   Vital Signs: BP 108/86 (BP Location: Right Arm)   Pulse 88   Temp 98.1 F (36.7 C) (Oral)   Resp 16   Ht 5\' 4"  (1.626 m)   Wt 217 lb (98.4 kg)   SpO2 93%   BMI 37.25 kg/m   Advance Care Plan: The advanced care place/surrogate decision maker was discussed at the time of visit and the patient did not wish to discuss or was not able to name a surrogate decision maker or provide an advance care plan.  Physical Exam Vitals reviewed.  Constitutional:      General: He is not in acute distress.    Appearance: Normal appearance.  HENT:     Mouth/Throat:      Mouth: Mucous membranes are moist.  Cardiovascular:     Rate and Rhythm: Normal rate and regular rhythm.     Pulses: Normal pulses.     Heart sounds: No murmur heard. Pulmonary:     Effort: Pulmonary effort is normal. No respiratory distress.     Breath sounds: Normal breath sounds.  Abdominal:     General: Abdomen is flat.     Tenderness: There is no abdominal tenderness.  Musculoskeletal:        General: Normal range of motion.  Skin:    General: Skin is warm and dry.  Neurological:     Mental Status: He is alert and oriented to person, place, and time.  Psychiatric:        Mood and Affect: Mood normal.        Behavior: Behavior normal.        Thought Content: Thought content normal.        Judgment: Judgment normal.     Imaging: EEG adult Result Date: 01/31/2024 Charlsie Quest, MD     01/31/2024  5:13 PM Patient Name: DERVIN VORE MRN: [id number] Epilepsy Attending: Charlsie Quest Referring Physician/Provider: Gloris Manchester, MD Date: 01/31/2024 Duration: 22.11 mins Patient history: 68yo m with transient episode of altered mental status, confusion, had urine incontinence. EEG to evaluate for seizure Level of alertness: Awake AEDs during EEG study: None Technical aspects: This EEG study was done with scalp electrodes positioned according to the 10-20 International system of electrode placement. Electrical activity was reviewed with band pass filter of 1-70Hz , sensitivity of 7 uV/mm, display speed of 54mm/sec with a 60Hz  notched filter applied as appropriate. EEG data were recorded continuously and digitally stored.  Video monitoring was available and reviewed as appropriate. Description: The posterior dominant rhythm consists of 9 Hz activity of moderate voltage (25-35 uV) seen predominantly in posterior head regions, symmetric and reactive to eye opening and eye closing. Hyperventilation and photic stimulation were not performed.   IMPRESSION: This study is within normal limits. No  seizures or epileptiform discharges were seen throughout the recording. A normal interictal EEG does not exclude the diagnosis of epilepsy. Priyanka [name]  MR Brain W and Wo Contrast Result Date: 01/30/2024 CLINICAL DATA:  Metastatic disease evaluation. Confusion. Fall. History of hematologic malignancy and lymphadenopathy. EXAM: MRI HEAD WITHOUT AND WITH CONTRAST TECHNIQUE: Multiplanar, multiecho pulse sequences of the brain and surrounding structures were obtained without and with intravenous contrast. CONTRAST:  10mL GADAVIST GADOBUTROL 1 MMOL/ML IV SOLN COMPARISON:  Head CT same day.  PET scan 01/23/2024 FINDINGS: Brain: Fusion imaging does not show any acute or subacute infarction. No focal abnormality affects the brainstem or cerebellum. Cerebral hemispheres show mild chronic small-vessel ischemic changes of the white matter. No cortical or large vessel territory infarction. No intracranial mass lesion, hemorrhage, hydrocephalus or extra-axial collection. No abnormal brain or leptomeningeal enhancement occurs. Vascular: Major vessels at the base of the brain show flow. Skull and upper cervical spine: No bone abnormality seen. Sinuses/Orbits: Mucosal inflammatory changes of the paranasal sinuses. Orbits negative. Other: Soft tissue masses of the scalp and the masticator space on the left as shown previously. IMPRESSION: 1. No acute or reversible intracranial finding. No evidence of intracranial metastatic disease. Mild chronic small-vessel ischemic changes of the cerebral hemispheric white matter. 2. Soft tissue masses of the scalp and masticator space on the left as shown previously. Electronically Signed   By: Paulina Fusi M.D.   On: 01/30/2024 18:35   CT Angio Chest/Abd/Pel for Dissection W and/or Wo Contrast Result Date: 01/30/2024 CLINICAL DATA:  Larey Seat yesterday, seizure, low back pain, cough, history of hematologic malignancy and head and neck lymphadenopathy EXAM: CT ANGIOGRAPHY CHEST, ABDOMEN  AND PELVIS TECHNIQUE: Non-contrast CT of the chest was initially obtained. Multidetector CT imaging through the chest, abdomen and pelvis was performed using the standard protocol during bolus administration of intravenous contrast. Multiplanar reconstructed images and MIPs were obtained and reviewed to evaluate the vascular anatomy. RADIATION DOSE REDUCTION: This exam was performed according to the departmental dose-optimization program which includes automated exposure control, adjustment of the mA and/or kV according to patient size and/or use of iterative reconstruction technique. CONTRAST:  75mL OMNIPAQUE IOHEXOL 350 MG/ML SOLN COMPARISON:  01/30/2024, 01/23/2024 FINDINGS: CTA CHEST FINDINGS Cardiovascular: No evidence of thoracic aortic aneurysm or dissection. The heart is unremarkable without pericardial effusion. There is adequate opacification of the central and segmental pulmonary arteries, with no filling defects or pulmonary emboli identified. Atherosclerosis of the aorta and coronary vasculature. Mediastinum/Nodes: No enlarged mediastinal, hilar, or axillary lymph nodes. Thyroid gland, trachea, and esophagus demonstrate no significant findings. Lungs/Pleura: 1.8 x 1.4 cm spiculated left hilar mass encasing the left lower lobe superior segment bronchus, reference image 60/6. This area appears hypermetabolic on recent PET scan, and is highly concerning for malignancy. Bronchoscopy may be useful for further evaluation. No acute airspace disease, effusion, or pneumothorax. Central airways are patent. Musculoskeletal: No acute or destructive bony abnormalities. Reconstructed images demonstrate no additional findings. Review of the MIP images confirms the above findings. CTA ABDOMEN AND PELVIS FINDINGS VASCULAR Aorta: Normal caliber aorta without aneurysm, dissection, vasculitis or significant stenosis. Mild atherosclerosis. Celiac: Patent without evidence of aneurysm, dissection, vasculitis or significant  stenosis. SMA: Patent without evidence of aneurysm, dissection, vasculitis or significant stenosis. Renals: Both renal arteries are patent without evidence of aneurysm, dissection, vasculitis, fibromuscular dysplasia or significant stenosis. Mild atherosclerosis within the proximal left renal artery. IMA: Patent without evidence of aneurysm, dissection, vasculitis or significant stenosis. Inflow: Patent without evidence of aneurysm, dissection, vasculitis or significant stenosis. Veins: No obvious venous abnormality within the limitations of this arterial phase study. Review of the MIP images  confirms the above findings. NON-VASCULAR Hepatobiliary: No focal liver abnormality is seen. No gallstones, gallbladder wall thickening, or biliary dilatation. Pancreas: Unremarkable. No pancreatic ductal dilatation or surrounding inflammatory changes. Spleen: Normal in size without focal abnormality. Adrenals/Urinary Tract: Kidneys enhance normally and symmetrically. No urinary tract calculi or obstructive uropathy. The adrenals are unremarkable. There is a 1.6 x 1.4 x 1.2 cm hyperdense mass along the posterior aspect of the bladder just superior to the right UVJ, reference image 164/4. Cystoscopy is recommended for further evaluation. Stomach/Bowel: No bowel obstruction or ileus. The appendix is surgically absent. Diverticulosis of the descending and sigmoid colon without evidence of diverticulitis. No bowel wall thickening or inflammatory change. Lymphatic: Stable borderline enlarged lymph nodes within the bilateral inguinal regions. No other pathologic adenopathy. Reproductive: Prostate is unremarkable. Other: No free fluid or free intraperitoneal gas. Stable fat containing left inguinal hernia. No bowel herniation. Musculoskeletal: No acute or destructive bony abnormalities. Reconstructed images demonstrate no additional findings. Review of the MIP images confirms the above findings. IMPRESSION: Vascular: 1. No evidence of  thoracoabdominal aortic aneurysm or dissection. 2. No evidence of pulmonary embolus. 3.  Aortic Atherosclerosis (ICD10-I70.0). Nonvascular: 1. 1.8 x 1.4 cm spiculated left hilar mass, encasing the left lower lobe superior segmental bronchus at its origin, highly concerning for malignancy. This area appears hypermetabolic on recent PET scan. Bronchoscopy may be useful further evaluation. 2. 1.6 cm hyperdense mass along the posterior wall the bladder. Cystoscopy recommended for further evaluation. 3. Stable borderline enlarged bilateral inguinal lymph nodes, nonspecific. 4. Distal colonic diverticulosis without diverticulitis. Electronically Signed   By: Sharlet Salina M.D.   On: 01/30/2024 15:42   CT Head Wo Contrast Result Date: 01/30/2024 CLINICAL DATA:  Larey Seat yesterday, seizure, history of hematologic malignancy and lymphadenopathy EXAM: CT HEAD WITHOUT CONTRAST TECHNIQUE: Contiguous axial images were obtained from the base of the skull through the vertex without intravenous contrast. RADIATION DOSE REDUCTION: This exam was performed according to the departmental dose-optimization program which includes automated exposure control, adjustment of the mA and/or kV according to patient size and/or use of iterative reconstruction technique. COMPARISON:  12/28/2023, 01/23/2024 FINDINGS: Brain: No acute infarct or hemorrhage. Lateral ventricles and midline structures are unremarkable. No acute extra-axial fluid collections. No mass effect. Vascular: No hyperdense vessel or unexpected calcification. Skull: Soft tissue nodularity along the outer table of the left parietal region of the calvarium is again noted, measuring up to 7 mm in thickness reference image 44/3, corresponding to hypermetabolic deposits on recent PET scan. Negative for fracture or focal lesion. Sinuses/Orbits: Enlargement of the left muscles of mastication is noted, measuring 4.1 x 2.2 cm reference image 11/3, previously measuring 3.8 x 1.8 cm. This  was noted to be hypermetabolic on recent PET scan 01/23/2024. Stable left periauricular adenopathy, measuring up to 10 mm in short axis reference image 3/3, also hypermetabolic on recent PET scan. Paranasal sinuses are unremarkable. Other: None. IMPRESSION: 1. No acute infarct or hemorrhage. 2. Stable enlargement of the left muscles of mastication, subcutaneous soft tissue nodularity along the left parietal region of the calvarium, and left periauricular adenopathy, consistent with underlying malignancy as noted on recent PET scan. Electronically Signed   By: Sharlet Salina M.D.   On: 01/30/2024 15:28   DG Chest Portable 1 View Result Date: 01/30/2024 CLINICAL DATA:  Cough EXAM: PORTABLE CHEST 1 VIEW COMPARISON:  02/13/2019 FINDINGS: The heart size and mediastinal contours are within normal limits. Both lungs are clear. The visualized skeletal structures are unremarkable. IMPRESSION: No  active disease. Electronically Signed   By: Duanne Guess D.O.   On: 01/30/2024 11:04   CT Lumbar Spine Wo Contrast Result Date: 01/30/2024 CLINICAL DATA:  Low back pain with cauda equina syndrome suspected EXAM: CT LUMBAR SPINE WITHOUT CONTRAST TECHNIQUE: Multidetector CT imaging of the lumbar spine was performed without intravenous contrast administration. Multiplanar CT image reconstructions were also generated. RADIATION DOSE REDUCTION: This exam was performed according to the departmental dose-optimization program which includes automated exposure control, adjustment of the mA and/or kV according to patient size and/or use of iterative reconstruction technique. COMPARISON:  Lumbar MRI 01/11/2024 FINDINGS: Segmentation: 5 lumbar type vertebrae. Alignment: Normal. Vertebrae: No acute fracture or focal pathologic process. Paraspinal and other soft tissues: Negative. Disc levels: T12- L1: Spondylitic spurring.  No neural impingement. L1-L2: Disc space narrowing with ventral spondylitic spurring. No neural impingement.  L2-L3: Disc space narrowing with mild endplate and facet spurring. No neural compression. L3-L4: Disc space narrowing with endplate and facet spurring. Mild narrowing of the thecal sac. L4-L5: Disc narrowing with endplate and moderate facet spurring. Mild narrowing of the thecal sac and foramina. L5-S1:Disc space narrowing with endplate ridging. Degenerative facet spurring which is asymmetrically bulky on the right. IMPRESSION: 1. No acute finding. 2. 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 Electronically Signed   By: Tiburcio Pea M.D.   On: 01/30/2024 11:00   NM PET Image Initial (PI) Whole Body Result Date: 01/24/2024 CLINICAL DATA:  Initial treatment strategy for lymphadenopathy head neck. EXAM: NUCLEAR MEDICINE PET WHOLE BODY TECHNIQUE: 10.8 mCi F-18 FDG was injected intravenously. Full-ring PET imaging was performed from the head to foot after the radiotracer. CT data was obtained and used for attenuation correction and anatomic localization. Fasting blood glucose: 115 mg/dl COMPARISON:  None Available. FINDINGS: Mediastinal blood pool activity: SUV max 2.9 HEAD/NECK: Hypermetabolic enlargement of the LEFT temporalis muscle just beneath the zygomatic arch with SUV max equal 14.4 on image 33. asymmetric muscle thickening measures 19 mm on image 33. Additionally there are multiple LEFT level 2 and level 3 hypermetabolic lymph nodes. Example node just inferior to the tail the parotid gland on the LEFT with SUV max equal 11.9 on image 43. This nodule measures 21 mm. More inferiorly level 3 node on the LEFT measuring 10 mm on image 58 SUV max equal 10.6. Additionally, there are posterior triangle nodes on the LEFT measuring 10 mm each with SUV max equal 9.0 (image 52). Less well-defined hypermetabolic tissue within the RIGHT parotid gland measuring 11 mm on image 45) with SUV max equal 6.6. Incidental CT findings: none CHEST: No hypermetabolic  mediastinal or hilar nodes. No suspicious pulmonary nodules on the CT scan. Incidental CT findings: none ABDOMEN/PELVIS: No abnormal hypermetabolic activity within the liver, pancreas, adrenal glands, or spleen. No hypermetabolic lymph nodes in the abdomen or pelvis. Moderate hypermetabolic inguinal nodes are normal morphology with low metabolic activity (SUV max equal 5.9). Incidental CT findings: none SKELETON: No focal hypermetabolic activity to suggest skeletal metastasis. Incidental CT findings: none EXTREMITIES: No abnormal hypermetabolic activity in the lower extremities. Incidental CT findings: none IMPRESSION: 1. Hypermetabolic enlargement of the LEFT muscle of mastication just beneath the zygomatic arch. 2. Hypermetabolic LEFT level II, level III and LEFT posterior triangle lymph nodes . 3. Less well-defined hypermetabolic tissue within the RIGHT parotid gland. 4. No evidence of metastatic disease in the chest abdomen or pelvis. 5. Moderately hypermetabolic inguinal nodes with normal morphology are  indeterminate. Favor reactive. Electronically Signed   By: Genevive Bi M.D.   On: 01/24/2024 10:46   MR LUMBAR SPINE WO CONTRAST Result Date: 01/22/2024 CLINICAL DATA:  Low back pain with bilateral radiculopathy EXAM: MRI LUMBAR SPINE WITHOUT CONTRAST TECHNIQUE: Multiplanar, multisequence MR imaging of the lumbar spine was performed. No intravenous contrast was administered. COMPARISON:  09/10/2020 FINDINGS: Segmentation:  Standard. Alignment:  Trace retrolisthesis at L2-3. Vertebrae: No fracture, evidence of discitis, or bone lesion. Reactive subchondral marrow edema associated with the right L4-5 and L5-S1 facet joints. Conus medullaris and cauda equina: Conus extends to the L2 level. Conus and cauda equina appear normal. Paraspinal and other soft tissues: Colonic diverticulosis. No acute findings. Disc levels: T12-L1: Unremarkable. L1-L2: Minimal disc bulge without foraminal or canal stenosis.  Unchanged. L2-L3: Mild broad-based disc bulge and mild bilateral facet arthropathy. No canal stenosis. Mild bilateral foraminal stenosis. Findings have progressed from prior. L3-L4: Annular disc bulge with mild-to-moderate bilateral facet arthropathy. Mild canal stenosis and mild bilateral foraminal stenosis. Findings have progressed from prior. L4-L5: Annular disc bulge with posterior annular fissure. Mild-to-moderate facet arthropathy. No significant canal stenosis. Mild-to-moderate bilateral foraminal stenosis. No significant interval progression. L5-S1: Disc bulge and endplate spurring, eccentric to the right. Severe right-sided facet arthropathy. No canal stenosis. Moderate right and mild left foraminal stenosis. No significant interval progression. IMPRESSION: 1. Multilevel lumbar spondylosis, mildly progressed since 2021. 2. Mild canal stenosis at L3-4. 3. Mild-to-moderate bilateral foraminal stenosis at L4-5. 4. 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 5. Reactive subchondral marrow edema associated with the right L4-5 and L5-S1 facet joints, which may contribute to low back pain. Electronically Signed   By: Duanne Guess D.O.   On: 01/22/2024 11:37    Labs:  CBC: Recent Labs    01/15/24 1413 01/30/24 0924 01/31/24 0254  WBC 10.7* 10.0 5.4  HGB 14.8 13.5  13.6 12.2*  HCT 44.3 39.4  40.0 36.2*  PLT 261 196 152    COAGS: No results for input(s): "INR", "APTT" in the last 8760 hours.  BMP: Recent Labs    01/15/24 1413 01/22/24 1307 01/30/24 0924 01/31/24 0254 02/01/24 0459  NA 137 143 134*  136 138 138  K 4.5 4.7 4.9  4.7 4.4 3.6  CL 99 100 95*  96* 103 106  CO2 28 29 28 27 25   GLUCOSE 172* 100* 124*  121* 155* 110*  BUN 18 17 23  23 21 18   CALCIUM 9.9 10.0 9.4 8.7* 8.2*  CREATININE 1.35* 1.52* 1.99*  2.30* 1.42* 1.15  GFRNONAA 57*  --  36* 54* >60    LIVER FUNCTION TESTS: Recent Labs    06/04/23 0812 01/15/24 1413 01/22/24 1307  01/30/24 0924  BILITOT 0.4 0.6 0.3 0.9  AST 16 18 16 26   ALT 14 18 16 18   ALKPHOS 79 69 86 61  PROT 6.8 7.8 7.0 7.8  ALBUMIN 4.3 4.4 4.5 4.2    TUMOR MARKERS: No results for input(s): "AFPTM", "CEA", "CA199", "CHROMGRNA" in the last 8760 hours.  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 IR was requested for biopsy. Patient presents for same in IR today.  Patient drank orange juice this morning and took his Eliquis last night.  Risks and benefits of left parotid gland lesion was discussed with the patient and/or patient's family including, but not limited to bleeding, infection, damage to adjacent structures or low yield requiring additional tests.  All of the questions were answered and there  is agreement to proceed.  Consent signed and in chart.    Thank you for allowing our service to participate in [facility] 's care.  Electronically Signed: Sable Feil, PA-C   02/07/2024, 7:37 AM      I spent a total of  30 Minutes  in face to face in clinical consultation, greater than 50% of which was counseling/coordinating care for left parotid gland mass with consideration for biopsy.

## 2024-02-07 ENCOUNTER — Other Ambulatory Visit: Payer: Self-pay

## 2024-02-07 ENCOUNTER — Ambulatory Visit (HOSPITAL_COMMUNITY)
Admit: 2024-02-07 | Discharge: 2024-02-07 | Disposition: A | Discharge status: Home / Self Care | Payer: Medicare Other | Attending: Otolaryngology | Admitting: Otolaryngology

## 2024-02-07 ENCOUNTER — Other Ambulatory Visit (HOSPITAL_COMMUNITY): Payer: Self-pay | Admitting: Otolaryngology

## 2024-02-07 DIAGNOSIS — C8290 Follicular lymphoma, unspecified, unspecified site: Secondary | ICD-10-CM | POA: Insufficient documentation

## 2024-02-07 DIAGNOSIS — Z720 Tobacco use: Secondary | ICD-10-CM

## 2024-02-07 DIAGNOSIS — Z7985 Long-term (current) use of injectable non-insulin antidiabetic drugs: Secondary | ICD-10-CM | POA: Diagnosis not present

## 2024-02-07 DIAGNOSIS — Z87442 Personal history of urinary calculi: Secondary | ICD-10-CM | POA: Insufficient documentation

## 2024-02-07 DIAGNOSIS — C07 Malignant neoplasm of parotid gland: Secondary | ICD-10-CM

## 2024-02-07 DIAGNOSIS — E119 Type 2 diabetes mellitus without complications: Secondary | ICD-10-CM | POA: Diagnosis not present

## 2024-02-07 DIAGNOSIS — M4726 Other spondylosis with radiculopathy, lumbar region: Secondary | ICD-10-CM | POA: Insufficient documentation

## 2024-02-07 DIAGNOSIS — I10 Essential (primary) hypertension: Secondary | ICD-10-CM | POA: Diagnosis not present

## 2024-02-07 DIAGNOSIS — Z7901 Long term (current) use of anticoagulants: Secondary | ICD-10-CM | POA: Diagnosis not present

## 2024-02-07 DIAGNOSIS — G4733 Obstructive sleep apnea (adult) (pediatric): Secondary | ICD-10-CM | POA: Insufficient documentation

## 2024-02-07 DIAGNOSIS — Z87891 Personal history of nicotine dependence: Secondary | ICD-10-CM | POA: Diagnosis not present

## 2024-02-07 DIAGNOSIS — M5116 Intervertebral disc disorders with radiculopathy, lumbar region: Secondary | ICD-10-CM | POA: Insufficient documentation

## 2024-02-07 DIAGNOSIS — R599 Enlarged lymph nodes, unspecified: Secondary | ICD-10-CM

## 2024-02-07 DIAGNOSIS — K219 Gastro-esophageal reflux disease without esophagitis: Secondary | ICD-10-CM | POA: Insufficient documentation

## 2024-02-07 DIAGNOSIS — R918 Other nonspecific abnormal finding of lung field: Secondary | ICD-10-CM | POA: Diagnosis present

## 2024-02-07 DIAGNOSIS — R220 Localized swelling, mass and lump, head: Secondary | ICD-10-CM | POA: Diagnosis not present

## 2024-02-07 DIAGNOSIS — I700 Atherosclerosis of aorta: Secondary | ICD-10-CM | POA: Diagnosis not present

## 2024-02-07 LAB — GLUCOSE, CAPILLARY: Glucose-Capillary: 118 mg/dL — ABNORMAL HIGH (ref 70–99)

## 2024-02-07 MED ORDER — MIDAZOLAM HCL 2 MG/2ML IJ SOLN
INTRAMUSCULAR | Status: AC
  Filled 2024-02-07: qty 2

## 2024-02-07 MED ORDER — FENTANYL CITRATE (PF) 100 MCG/2ML IJ SOLN
INTRAMUSCULAR | Status: AC | PRN
  Administered 2024-02-07 (×2): 25 ug via INTRAVENOUS

## 2024-02-07 MED ORDER — FENTANYL CITRATE (PF) 100 MCG/2ML IJ SOLN
INTRAMUSCULAR | Status: AC
  Filled 2024-02-07: qty 2

## 2024-02-07 MED ORDER — LIDOCAINE HCL (PF) 1 % IJ SOLN
10.0000 mL | Freq: Once | INTRAMUSCULAR | Status: AC
  Administered 2024-02-07: 10 mL via INTRADERMAL

## 2024-02-07 MED ORDER — SODIUM CHLORIDE 0.9 % IV SOLN: INTRAVENOUS | Status: DC

## 2024-02-07 MED ORDER — SODIUM CHLORIDE 0.9 % IV SOLN
INTRAVENOUS | Status: AC | PRN
  Administered 2024-02-07: 10 mL/h via INTRAVENOUS

## 2024-02-07 MED ORDER — MIDAZOLAM HCL 2 MG/2ML IJ SOLN
INTRAMUSCULAR | Status: AC | PRN
  Administered 2024-02-07: .5 mg via INTRAVENOUS
  Administered 2024-02-07: 1 mg via INTRAVENOUS

## 2024-02-07 NOTE — Procedures (Signed)
Interventional Radiology Procedure Note  Procedure: US guided core biopsy of left facial mass and left submandibular lymphadenopathy  Complications: None  Estimated Blood Loss: None  Recommendations: - DC home    Signed,  Sterling Big, MD

## 2024-02-07 NOTE — Sedation Documentation (Signed)
When asked about exact time of orange juice, states 3:45 AM

## 2024-02-07 NOTE — Sedation Documentation (Signed)
Dr Elby Showers aware patient took his Eliquis yesterday and that patient had approximately 6oz of orange juice at 0400. OK to proceed with procedure with one med only.

## 2024-02-08 ENCOUNTER — Ambulatory Visit: Payer: TRICARE For Life (TFL) | Admitting: Dietician

## 2024-02-13 ENCOUNTER — Other Ambulatory Visit (HOSPITAL_COMMUNITY): Payer: Medicare Other

## 2024-02-13 ENCOUNTER — Ambulatory Visit (HOSPITAL_COMMUNITY): Payer: Medicare Other

## 2024-02-13 ENCOUNTER — Encounter (HOSPITAL_COMMUNITY): Payer: Self-pay

## 2024-02-15 ENCOUNTER — Ambulatory Visit: Payer: Self-pay | Admitting: Dietician

## 2024-02-15 LAB — SURGICAL PATHOLOGY

## 2024-02-17 NOTE — Progress Notes (Signed)
 [facility] [address] [place] [address]    Clinic Day:  02/18/2024  Referring physician: Benita Stabile, MD  Patient Care Team: Benita Stabile, MD as PCP - General (Internal Medicine) Doreatha Massed, MD as Medical Oncologist (Medical Oncology) Therese Sarah, RN as Oncology Nurse Navigator (Medical Oncology)   ASSESSMENT & PLAN:   Assessment: 1.  Left facial soft tissue mass: - Left facial mass for the last 4-5 months, gradually increasing.  No hearing loss/vision changes. - No fevers/night sweats.  57 pound weight loss in the last 2 years, has been on Mounjaro for the last 1 and half year. - Ultrasound (11/30/2023): Complex 3.6 x 1.1 x 2.9 cm cystic structure in front of the left ear. - CT head (12/27/2022): Spiculated soft tissue mass around the left zygomatic arch measuring 2.2 cm in thickness and 4.3 cm in length.  Multifocal lobulated and infiltrative appearing soft tissue along the deep posterior left scalp convexity might be in part abnormal left level 5 lymph nodes. - PET scan (01/23/2024): Hypermetabolic enlargement of the left muscle of mastication just beneath the zygomatic arch with hypermetabolic left level 2, level 3, left posterior triangle lymph nodes.  Less well-defined hypermetabolic tissue within the right parotid gland.  Moderate hypermetabolic inguinal nodes with normal morphology are indeterminate.  1.8 x 1.4 spiculated left hilar mass encasing the left lower lobe superior segmental bronchus at its origin. - Left facial mass biopsy (02/07/2024): Follicular lymphoma, favoring classic type.  Proliferative rate by Ki-67 is low. - FLIPI score: 2 (age> 60, stage III/IV), intermediate high risk with 2-year OS 94%, 5-year OS 78% and 10-year OS 51%.   2. 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 Quit smoking 10-12 years ago of 0.5 to 0.75 ppd since age 63. Asbestos exposure in the [employer]. AFFF  exposure. -Mother died of lymphoma. No other family history of cancer. No family history of blood clots.    Plan: 1.  Stage IVB classical follicular lymphoma: - We have reviewed PET CT scan images.  We have also discussed biopsy results which is consistent with follicular lymphoma. - We talked about management with combination chemoimmunotherapy. - We discussed Bendamustine and obinutuzumab (GALLIUM trial) which showed improvement in PFS compared to Bendamustine and rituximab regimen. - We discussed schedule and side effects in detail. - He will need port placement. - Will check beta-2 microglobulin, LDH, uric acid, hepatitis B and C serology today. - Social work and palliative consult were placed.  I will see him back on day 8.  2.  ID prophylaxis: - Will start him on Bactrim 3 times weekly for PJP prophylaxis and acyclovir for zoster prophylaxis.  3.  Left hilar mass: - This was seen on the PET scan is hypermetabolic area. - We discussed biopsy by doing a bronchoscopy.  We also discussed possibility of starting chemoimmunotherapy and repeating scan in 3 months.  If the left hilar mass persists, we will pursue the bronchoscopy and biopsy at that time.  If it goes away, we can assume it is from the lymphoma.     Orders Placed This Encounter  Procedures   IR IMAGING GUIDED PORT INSERTION    Standing Status:   Future    Expiration Date:   02/17/2025    Reason for Exam (SYMPTOM  OR DIAGNOSIS REQUIRED):   chemotherapy administration    Preferred Imaging Location?:   [facility]  Beta 2 microglobulin, serum    Standing Status:   Future    Number of Occurrences:   1    Expected Date:   02/18/2024    Expiration Date:   02/17/2025   Hepatitis B core antibody, total    Standing Status:   Future    Number of Occurrences:   1    Expected Date:   02/18/2024    Expiration Date:   02/17/2025   Hepatitis B surface antibody,qualitative    Standing Status:   Future    Number of Occurrences:    1    Expected Date:   02/18/2024    Expiration Date:   02/17/2025   Hepatitis C antibody    Standing Status:   Future    Number of Occurrences:   1    Expected Date:   02/18/2024    Expiration Date:   02/17/2025   Hepatitis B surface antigen    Standing Status:   Future    Number of Occurrences:   1    Expected Date:   02/18/2024    Expiration Date:   02/17/2025   Uric acid    Standing Status:   Future    Number of Occurrences:   1    Expected Date:   02/18/2024    Expiration Date:   02/17/2025    Release to patient:   Immediate   Lactate dehydrogenase    Standing Status:   Future    Number of Occurrences:   1    Expected Date:   02/18/2024    Expiration Date:   02/17/2025      I,Katie Daubenspeck,acting as a scribe for Doreatha Massed, MD.,have documented all relevant documentation on the behalf of Doreatha Massed, MD,as directed by  Doreatha Massed, MD while in the presence of Doreatha Massed, MD.   I, Doreatha Massed MD, have reviewed the above documentation for accuracy and completeness, and I agree with the above.   Doreatha Massed, MD   3/3/20251:04 PM  CHIEF COMPLAINT:   Diagnosis: follicular lymphoma   Cancer Staging  Follicular lymphoma (HCC) Staging form: Hodgkin and Non-Hodgkin Lymphoma, AJCC 8th Edition - Clinical stage from 02/18/2024: Stage IV (Follicular lymphoma) - Unsigned    Prior Therapy: none  Current Therapy: Bendamustine and obinutuzumab   HISTORY OF PRESENT ILLNESS:   Oncology History   No history exists.     INTERVAL HISTORY:   Wyndham is a 68 y.o. male presenting to clinic today for follow up of follicular lymphoma. He was last seen by me on 01/15/24 in consultation.  Since his last visit, he underwent staging PET scan on 01/23/24 showing: hypermetabolic enlargement of left muscle of mastication just beneath zygomatic arch; hypermetabolic left-sided lymph nodes at level II, level III and posterior triangle; less well-defined  hypermetabolic tissue within right parotid gland; no evidence of metastatic disease in chest, abdomen, or pelvis; moderately hypermetabolic inguinal nodes with normal morphology, indeterminate but favor reactive.  He also underwent biopsy of the left preauricular soft tissue mass and lymph node on 02/07/24. Pathology revealed follicular lymphoma, favor classic. Flow pathology showed monoclonal B-cell population with co-expression of CD10 comprising 70% of all lymphocytes.  Of note, he presented to the ED on 01/30/24 with altered mental status/acute encephalopathy. A brain MRI performed at that time was negative for intracranial findings/metastatic disease.  Today, he states that he is doing well overall. His appetite level is at 8570%. His energy level is at 75%.  PAST MEDICAL  HISTORY:   Past Medical History: Past Medical History:  Diagnosis Date   AKI (acute kidney injury) (HCC) 07/29/2020   Arthritis    Diabetes (HCC)    GERD (gastroesophageal reflux disease)    History of kidney stones    Hypercholesteremia    Hypertension    Migraine    Sleep apnea     Surgical History: Past Surgical History:  Procedure Laterality Date   ADENOIDECTOMY     APPENDECTOMY     CENTRAL VENOUS CATHETER INSERTION Left 04/20/2013   Procedure: INSERTION CENTRAL LINE LEFT SUBCLAVIAN;  Surgeon: Fabio Bering, MD;  Location: [facility];  Service: General;  Laterality: Left;   INCISION AND DRAINAGE PERIRECTAL ABSCESS N/A 04/20/2013   Procedure: SHARP SURGICAL DEBRIDEMENT PERINEAL INFECTION;  Surgeon: Fabio Bering, MD;  Location: [facility];  Service: General;  Laterality: N/A;   KNEE SURGERY Right    arthroscopic   SHOULDER SURGERY Bilateral    removal of bone.   TONSILLECTOMY      Social History: Social History   Socioeconomic History   Marital status: Divorced    Spouse name: Not on file   Number of children: 6   Years of education: Not on file   Highest education level: Not on file  Occupational  History   Occupation: Engineer, materials  Tobacco Use   Smoking status: Former    Current packs/day: 0.50    Average packs/day: 0.5 packs/day for 23.0 years (11.5 ttl pk-yrs)    Types: Cigarettes   Smokeless tobacco: Never  Vaping Use   Vaping status: Former  Substance and Sexual Activity   Alcohol use: Yes    Comment: rare   Drug use: No   Sexual activity: Not Currently    Birth control/protection: None  Other Topics Concern   Not on file  Social History Narrative   Not on file   Social Drivers of Health   Financial Resource Strain: Not on file  Food Insecurity: No Food Insecurity (02/05/2024)   Hunger Vital Sign    Worried About Running Out of Food in the Last Year: Never true    Ran Out of Food in the Last Year: Never true  Transportation Needs: No Transportation Needs (02/05/2024)   PRAPARE - Administrator, Civil Service (Medical): No    Lack of Transportation (Non-Medical): No  Physical Activity: Not on file  Stress: Not on file  Social Connections: Patient Declined (01/31/2024)   Social Connection and Isolation Panel [NHANES]    Frequency of Communication with Friends and Family: Patient declined    Frequency of Social Gatherings with Friends and Family: Patient declined    Attends Religious Services: Patient declined    Database administrator or Organizations: Patient declined    Attends Banker Meetings: Patient declined    Marital Status: Patient declined  Intimate Partner Violence: Not At Risk (02/05/2024)   Humiliation, Afraid, Rape, and Kick questionnaire    Fear of Current or Ex-Partner: No    Emotionally Abused: No    Physically Abused: No    Sexually Abused: No    Family History: Family History  Problem Relation Age of Onset   Cancer Mother    Dementia Father    Colon cancer Neg Hx    Rectal cancer Neg Hx    Stomach cancer Neg Hx    Esophageal cancer Neg Hx     Current Medications:  Current Outpatient Medications:     ALPRAZolam (XANAX) 0.5  MG tablet, Take 0.5 mg by mouth 2 (two) times daily., Disp: , Rfl:    apixaban (ELIQUIS) 5 MG TABS tablet, Take 1 tablet (5 mg total) by mouth 2 (two) times daily. Please start around August 31, 2020 after you complete the initial starter pack, Disp: 60 tablet, Rfl: 3   benzonatate (TESSALON) 200 MG capsule, Take 1 capsule (200 mg total) by mouth 3 (three) times daily. For cough, Disp: 20 capsule, Rfl: 0   Cholecalciferol (VITAMIN D3) 5000 units CAPS, Take 2 capsules by mouth daily. , Disp: , Rfl:    furosemide (LASIX) 40 MG tablet, Take 40 mg by mouth daily., Disp: , Rfl:    gabapentin (NEURONTIN) 600 MG tablet, Take 600 mg by mouth 3 (three) times daily., Disp: , Rfl:    guaiFENesin-dextromethorphan (ROBITUSSIN DM) 100-10 MG/5ML syrup, Take 10 mLs by mouth every 4 (four) hours as needed for cough (chest congestion)., Disp: , Rfl:    hydrocortisone (CORTEF) 10 MG tablet, TAKE 1 TABLET DAILY AT 8 A.M. AND 1 TABLET DAILY AT NOON, Disp: 180 tablet, Rfl: 1   insulin glargine (LANTUS) 100 UNIT/ML Solostar Pen, Inject 50 Units into the skin daily., Disp: , Rfl:    Multiple Vitamins-Minerals (MULTIVITAMIN PO), Take 1 tablet by mouth daily., Disp: , Rfl:    Omega-3 Fatty Acids (FISH OIL) 1200 MG CAPS, Take 1 capsule by mouth daily., Disp: , Rfl:    Oxycodone HCl 10 MG TABS, Take 10 mg by mouth 4 (four) times daily., Disp: , Rfl:    pantoprazole (PROTONIX) 40 MG tablet, Take 40 mg by mouth daily., Disp: , Rfl:    potassium chloride (KLOR-CON) 10 MEQ tablet, Take 10 mEq by mouth daily., Disp: , Rfl:    rosuvastatin (CRESTOR) 10 MG tablet, Take 10 mg by mouth daily., Disp: , Rfl:    tirzepatide (MOUNJARO) 12.5 MG/0.5ML Pen, Inject 12.5 mg into the skin once a week., Disp: 6 mL, Rfl: 0   Allergies: Allergies  Allergen Reactions   Prednisone Other (See Comments)    Unknown     REVIEW OF SYSTEMS:   Review of Systems  Constitutional:  Negative for chills, fatigue and fever.   HENT:   Negative for lump/mass, mouth sores, nosebleeds, sore throat and trouble swallowing.   Eyes:  Negative for eye problems.  Respiratory:  Positive for cough. Negative for shortness of breath.   Cardiovascular:  Negative for chest pain, leg swelling and palpitations.  Gastrointestinal:  Negative for abdominal pain, constipation, diarrhea, nausea and vomiting.  Genitourinary:  Negative for bladder incontinence, difficulty urinating, dysuria, frequency, hematuria and nocturia.   Musculoskeletal:  Negative for arthralgias, back pain, flank pain, myalgias and neck pain.  Skin:  Negative for itching and rash.  Neurological:  Negative for dizziness, headaches and numbness.  Hematological:  Does not bruise/bleed easily.  Psychiatric/Behavioral:  Negative for depression, sleep disturbance and suicidal ideas. The patient is nervous/anxious.   All other systems reviewed and are negative.    VITALS:   Blood pressure 111/75, pulse 96, temperature (!) 97.2 F (36.2 C), temperature source Tympanic, resp. rate 18, height 5\' 5"  (1.651 m), weight 222 lb (100.7 kg), SpO2 96%.  Wt Readings from Last 3 Encounters:  02/18/24 222 lb (100.7 kg)  02/07/24 217 lb (98.4 kg)  02/04/24 221 lb 6.4 oz (100.4 kg)    Body mass index is 36.94 kg/m.  Performance status (ECOG): 1 - Symptomatic but completely ambulatory  PHYSICAL EXAM:   Physical Exam Vitals and  nursing note reviewed. Exam conducted with a chaperone present.  Constitutional:      Appearance: Normal appearance.  Cardiovascular:     Rate and Rhythm: Normal rate and regular rhythm.     Pulses: Normal pulses.     Heart sounds: Normal heart sounds.  Pulmonary:     Effort: Pulmonary effort is normal.     Breath sounds: Normal breath sounds.  Abdominal:     Palpations: Abdomen is soft. There is no hepatomegaly, splenomegaly or mass.     Tenderness: There is no abdominal tenderness.  Musculoskeletal:     Right lower leg: No edema.     Left  lower leg: No edema.  Lymphadenopathy:     Cervical: No cervical adenopathy.     Right cervical: No superficial, deep or posterior cervical adenopathy.    Left cervical: No superficial, deep or posterior cervical adenopathy.     Upper Body:     Right upper body: No supraclavicular or axillary adenopathy.     Left upper body: No supraclavicular or axillary adenopathy.  Neurological:     General: No focal deficit present.     Mental Status: He is alert and oriented to person, place, and time.  Psychiatric:        Mood and Affect: Mood normal.        Behavior: Behavior normal.    LABS:   CBC     Component Value Date/Time   WBC 5.4 01/31/2024 0254   RBC 4.03 (L) 01/31/2024 0254   HGB 12.2 (L) 01/31/2024 0254   HCT 36.2 (L) 01/31/2024 0254   PLT 152 01/31/2024 0254   MCV 89.8 01/31/2024 0254   MCH 30.3 01/31/2024 0254   MCHC 33.7 01/31/2024 0254   RDW 14.3 01/31/2024 0254   LYMPHSABS 1.7 01/15/2024 1413   MONOABS 0.7 01/15/2024 1413   EOSABS 0.3 01/15/2024 1413   BASOSABS 0.0 01/15/2024 1413    CMP      Component Value Date/Time   NA 138 02/01/2024 0459   NA 143 01/22/2024 1307   K 3.6 02/01/2024 0459   CL 106 02/01/2024 0459   CO2 25 02/01/2024 0459   GLUCOSE 110 (H) 02/01/2024 0459   BUN 18 02/01/2024 0459   BUN 17 01/22/2024 1307   CREATININE 1.15 02/01/2024 0459   CALCIUM 8.2 (L) 02/01/2024 0459   PROT 7.8 01/30/2024 0924   PROT 7.0 01/22/2024 1307   ALBUMIN 4.2 01/30/2024 0924   ALBUMIN 4.5 01/22/2024 1307   AST 26 01/30/2024 0924   ALT 18 01/30/2024 0924   ALKPHOS 61 01/30/2024 0924   BILITOT 0.9 01/30/2024 0924   BILITOT 0.3 01/22/2024 1307   GFRNONAA >60 02/01/2024 0459   GFRAA 59 (L) 08/01/2020 0758     No results found for: "CEA1", "CEA" / No results found for: "CEA1", "CEA" No results found for: "PSA1" No results found for: "AVW098" No results found for: "CAN125"  No results found for: "TOTALPROTELP", "ALBUMINELP", "A1GS", "A2GS", "BETS",  "BETA2SER", "GAMS", "MSPIKE", "SPEI" No results found for: "TIBC", "FERRITIN", "IRONPCTSAT" Lab Results  Component Value Date   LDH 109 02/18/2024   LDH 116 01/15/2024     STUDIES:   [place] CORE BIOPSY (LYMPH NODES) Result Date: 02/07/2024 INDICATION: Hypermetabolic soft tissue within the left masseter space and left submandibular lymph nodes. Patient presents for ultrasound-guided core biopsy of both regions. EXAM: ULTRASOUND BIOPSY OF THE LYMPH NODES; MUSCLE/SOFT TISSUE CORE BIOPSY MEDICATIONS: None. ANESTHESIA/SEDATION: Moderate (conscious) sedation was employed during this procedure. A  total of Versed 1.5 mg and Fentanyl 50 mcg was administered intravenously by the Radiology nurse. Moderate Sedation Time: 14 minutes. The patient's level of consciousness and vital signs were monitored continuously by radiology nursing throughout the procedure under my direct supervision. FLUOROSCOPY TIME:  None. COMPLICATIONS: None immediate. PROCEDURE: Informed written consent was obtained from the patient after a thorough discussion of the procedural risks, benefits and alternatives. All questions were addressed. Maximal Sterile Barrier Technique was utilized including caps, mask, sterile gowns, sterile gloves, sterile drape, hand hygiene and skin antiseptic. A timeout was performed prior to the initiation of the procedure. LEFT FACE MASS Ultrasound was used to interrogate the left face and deep masticator space. There is an ill-defined hypoechoic soft tissue mass in the region. The overlying skin was sterilely prepped and draped in the standard fashion using chlorhexidine skin prep. Local anesthesia was attained by infiltration with 1% lidocaine. A small dermatotomy was made. Under real-time ultrasound guidance, multiple 18 gauge core biopsies were obtained of the soft tissue mass. Biopsy specimens were placed in ultrasound and delivered to pathology for further analysis. Post biopsy imaging demonstrates no evidence of  complication. LEFT SUBMANDIBULAR LYMPHADENOPATHY Ultrasound was next used to investigate the left submandibular space. A large abnormal hypoechoic lymph node is identified. A suitable skin entry site was selected and marked. The overlying skin was sterilely prepped and draped in the standard fashion using chlorhexidine skin prep. Local anesthesia was attained by infiltration with 1% lidocaine. A small dermatotomy was made. Under real-time ultrasound guidance, multiple 18 gauge core biopsies were obtained coaxially. Biopsy specimens were placed in saline and delivered to pathology for further analysis. Post biopsy imaging demonstrates no evidence of complication. The patient tolerated both procedures well. IMPRESSION: 1. Successful ultrasound-guided core biopsy of left face/masseter space lesion. 2. Successful ultrasound-guided core biopsy of left submandibular lymph node. Electronically Signed   By: Malachy Moan M.D.   On: 02/07/2024 11:30   [place] CORE BIOPSY (SOFT TISSUE) Result Date: 02/07/2024 INDICATION: Hypermetabolic soft tissue within the left masseter space and left submandibular lymph nodes. Patient presents for ultrasound-guided core biopsy of both regions. EXAM: ULTRASOUND BIOPSY OF THE LYMPH NODES; MUSCLE/SOFT TISSUE CORE BIOPSY MEDICATIONS: None. ANESTHESIA/SEDATION: Moderate (conscious) sedation was employed during this procedure. A total of Versed 1.5 mg and Fentanyl 50 mcg was administered intravenously by the Radiology nurse. Moderate Sedation Time: 14 minutes. The patient's level of consciousness and vital signs were monitored continuously by radiology nursing throughout the procedure under my direct supervision. FLUOROSCOPY TIME:  None. COMPLICATIONS: None immediate. PROCEDURE: Informed written consent was obtained from the patient after a thorough discussion of the procedural risks, benefits and alternatives. All questions were addressed. Maximal Sterile Barrier Technique was utilized  including caps, mask, sterile gowns, sterile gloves, sterile drape, hand hygiene and skin antiseptic. A timeout was performed prior to the initiation of the procedure. LEFT FACE MASS Ultrasound was used to interrogate the left face and deep masticator space. There is an ill-defined hypoechoic soft tissue mass in the region. The overlying skin was sterilely prepped and draped in the standard fashion using chlorhexidine skin prep. Local anesthesia was attained by infiltration with 1% lidocaine. A small dermatotomy was made. Under real-time ultrasound guidance, multiple 18 gauge core biopsies were obtained of the soft tissue mass. Biopsy specimens were placed in ultrasound and delivered to pathology for further analysis. Post biopsy imaging demonstrates no evidence of complication. LEFT SUBMANDIBULAR LYMPHADENOPATHY Ultrasound was next used to investigate the left submandibular space. A  large abnormal hypoechoic lymph node is identified. A suitable skin entry site was selected and marked. The overlying skin was sterilely prepped and draped in the standard fashion using chlorhexidine skin prep. Local anesthesia was attained by infiltration with 1% lidocaine. A small dermatotomy was made. Under real-time ultrasound guidance, multiple 18 gauge core biopsies were obtained coaxially. Biopsy specimens were placed in saline and delivered to pathology for further analysis. Post biopsy imaging demonstrates no evidence of complication. The patient tolerated both procedures well. IMPRESSION: 1. Successful ultrasound-guided core biopsy of left face/masseter space lesion. 2. Successful ultrasound-guided core biopsy of left submandibular lymph node. Electronically Signed   By: Malachy Moan M.D.   On: 02/07/2024 11:30   EEG adult Result Date: 01/31/2024 Charlsie Quest, MD     01/31/2024  5:13 PM Patient Name: Shawn Fleming MRN: [id number] Epilepsy Attending: Charlsie Quest Referring Physician/Provider: Gloris Manchester, MD  Date: 01/31/2024 Duration: 22.11 mins Patient history: 68yo m with transient episode of altered mental status, confusion, had urine incontinence. EEG to evaluate for seizure Level of alertness: Awake AEDs during EEG study: None Technical aspects: This EEG study was done with scalp electrodes positioned according to the 10-20 International system of electrode placement. Electrical activity was reviewed with band pass filter of 1-70Hz , sensitivity of 7 uV/mm, display speed of 49mm/sec with a 60Hz  notched filter applied as appropriate. EEG data were recorded continuously and digitally stored.  Video monitoring was available and reviewed as appropriate. Description: The posterior dominant rhythm consists of 9 Hz activity of moderate voltage (25-35 uV) seen predominantly in posterior head regions, symmetric and reactive to eye opening and eye closing. Hyperventilation and photic stimulation were not performed.   IMPRESSION: This study is within normal limits. No seizures or epileptiform discharges were seen throughout the recording. A normal interictal EEG does not exclude the diagnosis of epilepsy. Charlsie Quest   MR Brain W and Wo Contrast Result Date: 01/30/2024 CLINICAL DATA:  Metastatic disease evaluation. Confusion. Fall. History of hematologic malignancy and lymphadenopathy. EXAM: MRI HEAD WITHOUT AND WITH CONTRAST TECHNIQUE: Multiplanar, multiecho pulse sequences of the brain and surrounding structures were obtained without and with intravenous contrast. CONTRAST:  10mL GADAVIST GADOBUTROL 1 MMOL/ML IV SOLN COMPARISON:  Head CT same day.  PET scan 01/23/2024 FINDINGS: Brain: Fusion imaging does not show any acute or subacute infarction. No focal abnormality affects the brainstem or cerebellum. Cerebral hemispheres show mild chronic small-vessel ischemic changes of the white matter. No cortical or large vessel territory infarction. No intracranial mass lesion, hemorrhage, hydrocephalus or extra-axial  collection. No abnormal brain or leptomeningeal enhancement occurs. Vascular: Major vessels at the base of the brain show flow. Skull and upper cervical spine: No bone abnormality seen. Sinuses/Orbits: Mucosal inflammatory changes of the paranasal sinuses. Orbits negative. Other: Soft tissue masses of the scalp and the masticator space on the left as shown previously. IMPRESSION: 1. No acute or reversible intracranial finding. No evidence of intracranial metastatic disease. Mild chronic small-vessel ischemic changes of the cerebral hemispheric white matter. 2. Soft tissue masses of the scalp and masticator space on the left as shown previously. Electronically Signed   By: Paulina Fusi M.D.   On: 01/30/2024 18:35   CT Angio Chest/Abd/Pel for Dissection W and/or Wo Contrast Result Date: 01/30/2024 CLINICAL DATA:  Larey Seat yesterday, seizure, low back pain, cough, history of hematologic malignancy and head and neck lymphadenopathy EXAM: CT ANGIOGRAPHY CHEST, ABDOMEN AND PELVIS TECHNIQUE: Non-contrast CT of the chest was initially obtained.  Multidetector CT imaging through the chest, abdomen and pelvis was performed using the standard protocol during bolus administration of intravenous contrast. Multiplanar reconstructed images and MIPs were obtained and reviewed to evaluate the vascular anatomy. RADIATION DOSE REDUCTION: This exam was performed according to the departmental dose-optimization program which includes automated exposure control, adjustment of the mA and/or kV according to patient size and/or use of iterative reconstruction technique. CONTRAST:  75mL OMNIPAQUE IOHEXOL 350 MG/ML SOLN COMPARISON:  01/30/2024, 01/23/2024 FINDINGS: CTA CHEST FINDINGS Cardiovascular: No evidence of thoracic aortic aneurysm or dissection. The heart is unremarkable without pericardial effusion. There is adequate opacification of the central and segmental pulmonary arteries, with no filling defects or pulmonary emboli identified.  Atherosclerosis of the aorta and coronary vasculature. Mediastinum/Nodes: No enlarged mediastinal, hilar, or axillary lymph nodes. Thyroid gland, trachea, and esophagus demonstrate no significant findings. Lungs/Pleura: 1.8 x 1.4 cm spiculated left hilar mass encasing the left lower lobe superior segment bronchus, reference image 60/6. This area appears hypermetabolic on recent PET scan, and is highly concerning for malignancy. Bronchoscopy may be useful for further evaluation. No acute airspace disease, effusion, or pneumothorax. Central airways are patent. Musculoskeletal: No acute or destructive bony abnormalities. Reconstructed images demonstrate no additional findings. Review of the MIP images confirms the above findings. CTA ABDOMEN AND PELVIS FINDINGS VASCULAR Aorta: Normal caliber aorta without aneurysm, dissection, vasculitis or significant stenosis. Mild atherosclerosis. Celiac: Patent without evidence of aneurysm, dissection, vasculitis or significant stenosis. SMA: Patent without evidence of aneurysm, dissection, vasculitis or significant stenosis. Renals: Both renal arteries are patent without evidence of aneurysm, dissection, vasculitis, fibromuscular dysplasia or significant stenosis. Mild atherosclerosis within the proximal left renal artery. IMA: Patent without evidence of aneurysm, dissection, vasculitis or significant stenosis. Inflow: Patent without evidence of aneurysm, dissection, vasculitis or significant stenosis. Veins: No obvious venous abnormality within the limitations of this arterial phase study. Review of the MIP images confirms the above findings. NON-VASCULAR Hepatobiliary: No focal liver abnormality is seen. No gallstones, gallbladder wall thickening, or biliary dilatation. Pancreas: Unremarkable. No pancreatic ductal dilatation or surrounding inflammatory changes. Spleen: Normal in size without focal abnormality. Adrenals/Urinary Tract: Kidneys enhance normally and symmetrically.  No urinary tract calculi or obstructive uropathy. The adrenals are unremarkable. There is a 1.6 x 1.4 x 1.2 cm hyperdense mass along the posterior aspect of the bladder just superior to the right UVJ, reference image 164/4. Cystoscopy is recommended for further evaluation. Stomach/Bowel: No bowel obstruction or ileus. The appendix is surgically absent. Diverticulosis of the descending and sigmoid colon without evidence of diverticulitis. No bowel wall thickening or inflammatory change. Lymphatic: Stable borderline enlarged lymph nodes within the bilateral inguinal regions. No other pathologic adenopathy. Reproductive: Prostate is unremarkable. Other: No free fluid or free intraperitoneal gas. Stable fat containing left inguinal hernia. No bowel herniation. Musculoskeletal: No acute or destructive bony abnormalities. Reconstructed images demonstrate no additional findings. Review of the MIP images confirms the above findings. IMPRESSION: Vascular: 1. No evidence of thoracoabdominal aortic aneurysm or dissection. 2. No evidence of pulmonary embolus. 3.  Aortic Atherosclerosis (ICD10-I70.0). Nonvascular: 1. 1.8 x 1.4 cm spiculated left hilar mass, encasing the left lower lobe superior segmental bronchus at its origin, highly concerning for malignancy. This area appears hypermetabolic on recent PET scan. Bronchoscopy may be useful further evaluation. 2. 1.6 cm hyperdense mass along the posterior wall the bladder. Cystoscopy recommended for further evaluation. 3. Stable borderline enlarged bilateral inguinal lymph nodes, nonspecific. 4. Distal colonic diverticulosis without diverticulitis. Electronically Signed   By: Casimiro Needle  Manson Passey M.D.   On: 01/30/2024 15:42   CT Head Wo Contrast Result Date: 01/30/2024 CLINICAL DATA:  Larey Seat yesterday, seizure, history of hematologic malignancy and lymphadenopathy EXAM: CT HEAD WITHOUT CONTRAST TECHNIQUE: Contiguous axial images were obtained from the base of the skull through the  vertex without intravenous contrast. RADIATION DOSE REDUCTION: This exam was performed according to the departmental dose-optimization program which includes automated exposure control, adjustment of the mA and/or kV according to patient size and/or use of iterative reconstruction technique. COMPARISON:  12/28/2023, 01/23/2024 FINDINGS: Brain: No acute infarct or hemorrhage. Lateral ventricles and midline structures are unremarkable. No acute extra-axial fluid collections. No mass effect. Vascular: No hyperdense vessel or unexpected calcification. Skull: Soft tissue nodularity along the outer table of the left parietal region of the calvarium is again noted, measuring up to 7 mm in thickness reference image 44/3, corresponding to hypermetabolic deposits on recent PET scan. Negative for fracture or focal lesion. Sinuses/Orbits: Enlargement of the left muscles of mastication is noted, measuring 4.1 x 2.2 cm reference image 11/3, previously measuring 3.8 x 1.8 cm. This was noted to be hypermetabolic on recent PET scan 01/23/2024. Stable left periauricular adenopathy, measuring up to 10 mm in short axis reference image 3/3, also hypermetabolic on recent PET scan. Paranasal sinuses are unremarkable. Other: None. IMPRESSION: 1. No acute infarct or hemorrhage. 2. Stable enlargement of the left muscles of mastication, subcutaneous soft tissue nodularity along the left parietal region of the calvarium, and left periauricular adenopathy, consistent with underlying malignancy as noted on recent PET scan. Electronically Signed   By: Sharlet Salina M.D.   On: 01/30/2024 15:28   DG Chest Portable 1 View Result Date: 01/30/2024 CLINICAL DATA:  Cough EXAM: PORTABLE CHEST 1 VIEW COMPARISON:  02/13/2019 FINDINGS: The heart size and mediastinal contours are within normal limits. Both lungs are clear. The visualized skeletal structures are unremarkable. IMPRESSION: No active disease. Electronically Signed   By: Duanne Guess D.O.    On: 01/30/2024 11:04   CT Lumbar Spine Wo Contrast Result Date: 01/30/2024 CLINICAL DATA:  Low back pain with cauda equina syndrome suspected EXAM: CT LUMBAR SPINE WITHOUT CONTRAST TECHNIQUE: Multidetector CT imaging of the lumbar spine was performed without intravenous contrast administration. Multiplanar CT image reconstructions were also generated. RADIATION DOSE REDUCTION: This exam was performed according to the departmental dose-optimization program which includes automated exposure control, adjustment of the mA and/or kV according to patient size and/or use of iterative reconstruction technique. COMPARISON:  Lumbar MRI 01/11/2024 FINDINGS: Segmentation: 5 lumbar type vertebrae. Alignment: Normal. Vertebrae: No acute fracture or focal pathologic process. Paraspinal and other soft tissues: Negative. Disc levels: T12- L1: Spondylitic spurring.  No neural impingement. L1-L2: Disc space narrowing with ventral spondylitic spurring. No neural impingement. L2-L3: Disc space narrowing with mild endplate and facet spurring. No neural compression. L3-L4: Disc space narrowing with endplate and facet spurring. Mild narrowing of the thecal sac. L4-L5: Disc narrowing with endplate and moderate facet spurring. Mild narrowing of the thecal sac and foramina. L5-S1:Disc space narrowing with endplate ridging. Degenerative facet spurring which is asymmetrically bulky on the right. IMPRESSION: 1. No acute finding. 2. 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 Electronically Signed   By: Tiburcio Pea M.D.   On: 01/30/2024 11:00   NM PET Image Initial (PI) Whole Body Result Date: 01/24/2024 CLINICAL DATA:  Initial treatment strategy for lymphadenopathy head neck. EXAM: NUCLEAR MEDICINE PET WHOLE BODY TECHNIQUE: 10.8 mCi  F-18 FDG was injected intravenously. Full-ring PET imaging was performed from the head to foot after the radiotracer. CT data was obtained and used  for attenuation correction and anatomic localization. Fasting blood glucose: 115 mg/dl COMPARISON:  None Available. FINDINGS: Mediastinal blood pool activity: SUV max 2.9 HEAD/NECK: Hypermetabolic enlargement of the LEFT temporalis muscle just beneath the zygomatic arch with SUV max equal 14.4 on image 33. asymmetric muscle thickening measures 19 mm on image 33. Additionally there are multiple LEFT level 2 and level 3 hypermetabolic lymph nodes. Example node just inferior to the tail the parotid gland on the LEFT with SUV max equal 11.9 on image 43. This nodule measures 21 mm. More inferiorly level 3 node on the LEFT measuring 10 mm on image 58 SUV max equal 10.6. Additionally, there are posterior triangle nodes on the LEFT measuring 10 mm each with SUV max equal 9.0 (image 52). Less well-defined hypermetabolic tissue within the RIGHT parotid gland measuring 11 mm on image 45) with SUV max equal 6.6. Incidental CT findings: none CHEST: No hypermetabolic mediastinal or hilar nodes. No suspicious pulmonary nodules on the CT scan. Incidental CT findings: none ABDOMEN/PELVIS: No abnormal hypermetabolic activity within the liver, pancreas, adrenal glands, or spleen. No hypermetabolic lymph nodes in the abdomen or pelvis. Moderate hypermetabolic inguinal nodes are normal morphology with low metabolic activity (SUV max equal 5.9). Incidental CT findings: none SKELETON: No focal hypermetabolic activity to suggest skeletal metastasis. Incidental CT findings: none EXTREMITIES: No abnormal hypermetabolic activity in the lower extremities. Incidental CT findings: none IMPRESSION: 1. Hypermetabolic enlargement of the LEFT muscle of mastication just beneath the zygomatic arch. 2. Hypermetabolic LEFT level II, level III and LEFT posterior triangle lymph nodes . 3. Less well-defined hypermetabolic tissue within the RIGHT parotid gland. 4. No evidence of metastatic disease in the chest abdomen or pelvis. 5. Moderately hypermetabolic  inguinal nodes with normal morphology are indeterminate. Favor reactive. Electronically Signed   By: Genevive Bi M.D.   On: 01/24/2024 10:46

## 2024-02-18 ENCOUNTER — Inpatient Hospital Stay: Payer: Medicare Other | Attending: Hematology | Admitting: Hematology

## 2024-02-18 ENCOUNTER — Ambulatory Visit (HOSPITAL_COMMUNITY): Payer: Medicare Other

## 2024-02-18 ENCOUNTER — Other Ambulatory Visit (HOSPITAL_COMMUNITY): Payer: Medicare Other

## 2024-02-18 ENCOUNTER — Inpatient Hospital Stay

## 2024-02-18 VITALS — BP 111/75 | HR 96 | Temp 97.2°F | Resp 18 | Ht 65.0 in | Wt 222.0 lb

## 2024-02-18 DIAGNOSIS — C8259 Diffuse follicle center lymphoma, extranodal and solid organ sites: Secondary | ICD-10-CM | POA: Diagnosis not present

## 2024-02-18 DIAGNOSIS — E119 Type 2 diabetes mellitus without complications: Secondary | ICD-10-CM | POA: Diagnosis not present

## 2024-02-18 DIAGNOSIS — C8299 Follicular lymphoma, unspecified, extranodal and solid organ sites: Secondary | ICD-10-CM | POA: Insufficient documentation

## 2024-02-18 DIAGNOSIS — C8290 Follicular lymphoma, unspecified, unspecified site: Secondary | ICD-10-CM | POA: Insufficient documentation

## 2024-02-18 DIAGNOSIS — R918 Other nonspecific abnormal finding of lung field: Secondary | ICD-10-CM | POA: Diagnosis not present

## 2024-02-18 DIAGNOSIS — Z5111 Encounter for antineoplastic chemotherapy: Secondary | ICD-10-CM | POA: Insufficient documentation

## 2024-02-18 DIAGNOSIS — Z5112 Encounter for antineoplastic immunotherapy: Secondary | ICD-10-CM | POA: Insufficient documentation

## 2024-02-18 LAB — HEPATITIS B SURFACE ANTIBODY,QUALITATIVE: Hep B S Ab: NONREACTIVE

## 2024-02-18 LAB — HEPATITIS C ANTIBODY: HCV Ab: NONREACTIVE

## 2024-02-18 LAB — LACTATE DEHYDROGENASE: LDH: 109 U/L (ref 98–192)

## 2024-02-18 LAB — HEPATITIS B SURFACE ANTIGEN: Hepatitis B Surface Ag: NONREACTIVE

## 2024-02-18 LAB — URIC ACID: Uric Acid, Serum: 7.5 mg/dL (ref 3.7–8.6)

## 2024-02-18 NOTE — Progress Notes (Signed)
 START OFF PATHWAY REGIMEN - Lymphoma and CLL   Custom Intervention:Medical: [Bendamustine, Obinutuzumab]:     Bendamustine      Obinutuzumab   **Always confirm dose/schedule in your pharmacy ordering system**  Patient Characteristics: Follicular Lymphoma, Grades 1, 2, and 3A, First Line, Stage III / IV, Symptomatic or Bulky Disease Disease Type: Follicular Lymphoma, Grade 1, 2, or 3A Disease Type: Not Applicable Disease Type: Not Applicable Line of Therapy: First Line Disease Characteristics: Symptomatic or Bulky Disease Intent of Therapy: Non-Curative / Palliative Intent, Discussed with Patient

## 2024-02-18 NOTE — Patient Instructions (Addendum)
 [facility] at [facility] [place] Discharge Instructions   You were seen and examined today by Dr. Ellin Saba.  He reviewed the results of your biopsy. 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 He discussed with you treating this condition with a combination of drugs. One chemo drug and one antibody. The chemo is called bendamustine. The antibody is called Gazyva.   We will arrange for you to have a chemotherapy education session prior to treatment.   You will need a port a cath to receive treatments.    Return as scheduled.    Thank you for choosing [facility] at [facility] to provide your oncology and hematology care.  To afford each patient quality time with our provider, please arrive at least 15 minutes before your scheduled appointment time.   If you have a lab appointment with the Cancer Center please come in thru the Main Entrance and check in at the main information desk.  You need to re-schedule your appointment should you arrive 10 or more minutes late.  We strive to give you quality time with our providers, and arriving late affects you and other patients whose appointments are after yours.  Also, if you no show three or more times for appointments you may be dismissed from the clinic at the providers discretion.     Again, thank you for choosing [facility].  Our hope is that these requests will decrease the amount of time that you wait before being seen by our physicians.       _____________________________________________________________  Should you have questions after your visit to [facility], please contact our office at [phone] and follow the prompts.  Our office hours are 8:00 a.m. and 4:30 p.m. Monday - Friday.  Please note that voicemails left after 4:00 p.m. may not be returned until the following business day.  We are closed weekends and major holidays.  You do have  access to a nurse 24-7, just call the main number to the clinic [phone] and do not press any options, hold on the line and a nurse will answer the phone.    For prescription refill requests, have your pharmacy contact our office and allow 72 hours.    Due to Covid, you will need to wear a mask upon entering the hospital. If you do not have a mask, a mask will be given to you at the Main Entrance upon arrival. For doctor visits, patients may have 1 support person age 69 or older with them. For treatment visits, patients can not have anyone with them due to social distancing guidelines and our immunocompromised population.

## 2024-02-19 ENCOUNTER — Other Ambulatory Visit: Payer: Self-pay

## 2024-02-19 LAB — BETA 2 MICROGLOBULIN, SERUM: Beta-2 Microglobulin: 4.1 mg/L — ABNORMAL HIGH (ref 0.6–2.4)

## 2024-02-19 LAB — HEPATITIS B CORE ANTIBODY, TOTAL: HEP B CORE AB: NEGATIVE

## 2024-02-19 NOTE — Patient Instructions (Signed)
 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  You will receive the drugs Gazyva and Bendamustine with the intent as palliative meaning to control and manage your symptoms and spread of the disease.  You will come two days in a row (D1 and D2) then once a week for the next two weeks (Day 8 and Day 15) with a week off from treatment.       You will see the doctor regularly throughout treatment.  We will obtain blood work from you prior to every treatment and monitor your results to make sure it is safe to give your treatment. The doctor monitors your response to treatment by the way you are feeling, your blood work, and by obtaining scans periodically.  There will be wait times while you are here for treatment.  It will take about 30 minutes to 1 hour for your lab work to result.  Then there will be wait times while pharmacy mixes your medications.    Medications you will receive in the clinic prior to your chemotherapy medications:  Aloxi:  ALOXI is used in adults to help prevent nausea and vomiting that happens with certain chemotherapy drugs.  Aloxi is a long acting medication, and will remain in your system for about two days.   Dexamethasone:  This is a steroid given prior to chemotherapy to help prevent allergic reactions; it may also help prevent and control nausea and diarrhea.    Benadryl:  This is a histamine blocker (different from the Pepcid) that helps prevent allergic/infusion reactions to your chemotherapy. This medication may cause dizziness/drowsiness.   Tylenol to help prevent infusion reactions to the treatment.     Obinutuzumab Shelly Bombard)   About This Drug Obinutuzumab is used to treat cancer. It is given in the vein (IV).   Possible Side Effects  A decrease in the number of white blood cells. This may raise your risk of infection.    Diarrhea (loose bowel movements)    Constipation (unable to move bowels)    While  you are getting this drug in your vein (IV), you may have a reaction to the drug. Sometimes you may be given medication to stop or lessen these side effects. Your nurse will check you closely for these signs: fever or shaking chills, flushing, facial swelling, feeling dizzy, headache, trouble breathing, rash, itching, chest tightness, or chest pain. These reactions may happen after your infusion. If this happens, call 911 for emergency care.    Tiredness    Bone and muscle pain    Cough    Upper respiratory infection   Note: Each of the side effects above was reported in 20% or greater of patients treated with obinutuzumab given in combination with chemotherapy. Not all possible side effects are included above.   Warnings and Precautions  Severe infusion reactions and allergic reactions which can be life-threatening. Signs of allergic reaction to this drug may be swelling of the face, feeling like your tongue or throat are swelling, trouble breathing, rash, itching, fever, chills, feeling dizzy, and/or feeling that your heart is beating in a fast or not normal way. If this happens, do not take another dose of this drug. You should get urgent medical treatment.    Severe decrease in the numbers of white blood cells and platelets which can be life-threatening    Severe infections, including viral, bacterial, and fungal, which can very rarely be life-threatening    Reactivation of  the hepatitis B virus if you have been exposed in the past, which may very rarely cause liver failure and be life-threatening    Changes in your central nervous system can happen which can rarely be life-threatening. The central nervous system is made up of your brain and spinal cord. You could feel extreme tiredness, agitation, confusion, have hallucinations (see or hear things that are not there), trouble understanding or speaking, loss of control of your bowels or bladder, eyesight changes, numbness or lack of  strength to your arms, legs, face, or body, seizures or coma. If you start to have any of these symptoms let your doctor know right away.    Tumor lysis syndrome: This drug may act on the cancer cells very quickly. This may affect how your kidneys work and rarely be life-threatening.   Note: Some of the side effects above are very rare. If you have concerns and/or questions, please discuss them with your medical team.   Important Information   Talk to your doctor before receiving any vaccinations during and while you are recovering from your treatment. Some vaccinations are not recommended during or following obinutuzumab.    This drug may be present in the saliva, tears, sweat, urine, stool, vomit, semen, and vaginal secretions. Talk to your doctor and/or your nurse about the necessary precautions to take during this time.   Treating Side Effects  Manage tiredness by pacing your activities for the day.    Be sure to include periods of rest between energy-draining activities.    To decrease the risk of infection, wash your hands regularly.    Avoid close contact with people who have a cold, the flu, or other infections.    Take your temperature as your doctor or nurse tells you, and whenever you feel like you may have a fever.    To decrease the risk of bleeding, use a soft toothbrush. Check with your nurse before using dental floss.    Be very careful when using knives or tools.    Use an electric shaver instead of a razor.    Keeping your pain under control is important to your wellbeing. Please tell your doctor or nurse if you are experiencing pain.    Get regular exercise, with your doctor's approval. If you feel too tired to exercise vigorously, try taking a short walk.    Drink plenty of fluids (a minimum of eight glasses per day is recommended).    If you throw up or have loose bowel movements, you should drink more fluids so that you do not become dehydrated (lack of water  in the body from losing too much fluid).    If you have diarrhea, eat low-fiber foods that are high in protein and calories and avoid foods that can irritate your digestive tracts or lead to cramping.    Ask your doctor or nurse about medicines that are available to help stop or lessen constipation and/or diarrhea.    If you are not able to move your bowels, check with your doctor or nurse before you use enemas, laxatives, or suppositories.    Infusion reactions may occur after your infusion. If this happens, call 911 for emergency care.   Food and Drug Interactions  There are no known interactions of obinutuzumab with food.    Tell your doctor and pharmacist about all the prescription and over-the-counter medicines and dietary supplements (vitamins, minerals, herbs, and others) that you are taking at this time. Also, check with your  doctor or pharmacist before starting any new prescription or over-the-counter medicines, or dietary supplements to make sure that there are no interactions.   When to Call the Doctor Call your doctor or nurse if you have any of these symptoms and/or any new or unusual symptoms:  Fever of 100.4 F (38 C) or higher    Chills    Tiredness or weakness that interferes with your daily activities    Feeling dizzy or lightheaded    Chest pain    Easy bleeding or bruising    Headache that does not go away    Confusion and/or agitation    Seizures. Symptoms of a seizure such as confusion, blacking out, passing out, loss of hearing or vision, blurred vision, unusual smells or tastes (such as burning rubber), trouble talking, tremors or shaking in parts or all of the body, repeated body movements, tense muscles that do not relax, and loss of control of urine and bowels. If you or your family member suspects you are having a seizure, call 911 right away.    Hallucinations    Trouble understanding or speaking    Blurry vision or changes in your eyesight     Numbness or lack of strength to your arms, legs, face, or body    Wheezing or trouble breathing    Coughing up yellow, green, or bloody mucus    Diarrhea, 4 times in one day or diarrhea with lack of strength or a feeling of being dizzy    No bowel movement in 3 days or when you feel uncomfortable.    Signs of tumor lysis: confusion or agitation, decreased urine, nausea/vomiting, diarrhea, muscle cramping, numbness and/or tingling, seizures.    Signs of infusion reaction: fever or shaking chills, flushing, facial swelling, feeling dizzy, headache, trouble breathing, rash, itching, chest tightness, or chest pain. If this happens, call 911 for emergency care.    Signs of allergic reaction: swelling of the face, feeling like your tongue or throat are swelling, trouble breathing, rash, itching, fever, chills, feeling dizzy, and/or feeling that your heart is beating in a fast or not normal way. If this happens, call 911 for emergency care.    Signs of possible liver problems: dark urine, pale bowel movements, bad stomach pain, feeling very tired and weak, unusual itching, or yellowing of the eyes or skin    If you think you may be pregnant   Reproduction Warnings  Pregnancy warning: This drug can have harmful effects on the unborn baby. Women of childbearing potential should use effective methods of birth control during your cancer treatment and for at least 6 months after treatment.    Breastfeeding warning: It is not known if this drug passes into breast milk. For this reason, women should not breastfeed during treatment and for 6 months after treatment because this drug could enter the breast milk and cause harm to a breastfeeding baby.    Fertility warning: Human fertility studies have not been done with this drug. Talk with your doctor or nurse if you plan to have children. Ask for information on sperm or egg banking.    Bendamustine Kathi Der, Bendeka)   About This Drug Bendamustine is  used to treat cancer. It is given in the vein (IV).  It takes 10 minutes to infuse.    Possible Side Effects  Bone marrow suppression. This is a decrease in the number of white blood cells, red blood cells, and platelets. This may raise your risk of infection, make  you tired and weak (fatigue), and raise your risk of bleeding.    Soreness of the mouth and throat. You may have red areas, white patches, or sores that hurt.    Nausea and vomiting (throwing up)    Diarrhea (loose bowel movements)    Constipation (not able to move bowels)    Fever    Tiredness    Changes in your liver function    Decreased appetite (decreased hunger)    Weight loss    Headache    Cough, trouble breathing    Rash   Note: Each of the side effects above was reported in 15% or greater of patients treated with bendamustine. Not all possible side effects are included above.   Warnings and Precautions    Severe bone marrow suppression and infections, which may be life-threatening.    Allergic reactions, including anaphylaxis are rare but may happen in some patients. Signs of allergic reaction to this drug may be swelling of the face, feeling like your tongue or throat are swelling, trouble breathing, rash, itching, fever, chills, feeling dizzy, and/or feeling that your heart is beating in a fast or not normal way. If this happens, do not take another dose of this drug. You should get urgent medical treatment.      While you are getting this drug in your vein (IV), you may have a reaction to the drug. Sometimes you may be given medication to stop or lessen these side effects. Your nurse will check you closely for these signs: fever or shaking chills, flushing, facial swelling, feeling dizzy, headache, trouble breathing, rash, itching, chest tightness, or chest pain. These reactions may happen after your infusion. If this happens, call 911 for emergency care    Skin and tissue irritation may involve  redness, pain, warmth, or swelling at the IV site. This happens if the drug leaks out of the vein and into nearby tissue.    Tumor lysis syndrome: This drug may act on the cancer cells very quickly. This may affect how your kidneys work.    Severe allergic skin reaction, which may be life-threatening. You may develop blisters on your skin that are filled with fluid or a severe red rash all over your body that may be painful.    Severe changes in your liver function, which may be life-threatening.    This drug may raise your risk of getting a second cancer such as leukemia.   Note: Some of the side effects above are very rare. If you have concerns and/or questions, please discuss them with your medical team.   Important Information  This drug may be present in the saliva, tears, sweat, urine, stool, vomit, semen, and vaginal secretions. Talk to your doctor and/or your nurse about the necessary precautions to take during this time.   Treating Side Effects  Manage tiredness by pacing your activities for the day.    Be sure to include periods of rest between energy-draining activities.    To decrease the risk of infections, wash your hands regularly.    Avoid close contact with people who have a cold, the flu, or other infections.    Take your temperature as your doctor or nurse tells you, and whenever you feel like you may have a fever.    To help decrease the risk of bleeding, use a soft toothbrush. Check with your nurse before using dental floss.    Be very careful when using knives or tools.    Use  an electric shaver instead of a razor.    Drink plenty of fluids (a minimum of eight glasses per day is recommended).    Mouth care is very important. Your mouth care should consist of routine, gentle cleaning of your teeth or dentures and rinsing your mouth with a mixture of 1/2 teaspoon of salt in 8 ounces of water or 1/2 teaspoon of baking soda in 8 ounces of water. This should be done  at least after each meal and at bedtime.    If you have mouth sores, avoid mouthwash that has alcohol. Also avoid alcohol and smoking because they can bother your mouth and throat.    To help with nausea and vomiting, eat small, frequent meals instead of three large meals a day. Choose foods and drinks that are at room temperature. Ask your nurse or doctor about other helpful tips and medicine that is available to help stop or lessen these symptoms.    If you throw up or have loose bowel movements, you should drink more fluids so that you do not become dehydrated (lack of water in the body from losing too much fluid).    If you have diarrhea, eat low-fiber foods that are high in protein and calories and avoid foods that can irritate your digestive tracts or lead to cramping.    If you are not able to move your bowels, check with your doctor or nurse before you use enemas, laxatives, or suppositories.    Ask your doctor or nurse about medicines that are available to help stop or lessen constipation and/or diarrhea.    Infusion reactions may happen after your infusion. If this happens, call 911 for emergency care.    To help with weight loss, drink fluids that contribute calories (whole milk, juice, soft drinks, sweetened beverages, milkshakes, and nutritional supplements) instead of water.    To help with decreased appetite, eat small, frequent meals. Eat foods high in calories and protein, such as meat, poultry, fish, dry beans, tofu, eggs, nuts, milk, yogurt, cheese, ice cream, pudding, and nutritional supplements.    Consider using sauces and spices to increase taste. Daily exercise, with your doctor's approval, may increase your appetite.    Keeping your pain under control is important to your well-being. Please tell your doctor or nurse if you are experiencing pain.    If you get a rash do not put anything on it unless your doctor or nurse says you may. Keep the area around the rash clean  and dry. Ask your doctor for medicine if your rash bothers you.   Food and Drug Interactions  There are no known interactions of bendamustine with food.    Check with your doctor or pharmacist about all other prescription medicines and over-the-counter medicines and dietary supplements (vitamins, minerals, herbs and others) you are taking before starting this medicine as there are known drug interactions with bendamustine. Also, check with your doctor or pharmacist before starting any new prescription or over-the-counter medicines, or dietary supplements to make sure that there are no interactions.   When to Call the Doctor Call your doctor or nurse if you have any of these symptoms and/or any new or unusual symptoms:    Fever of 100.4 F (38 C) or higher    Chills    Tiredness that interferes with your daily activities    Feeling dizzy or lightheaded    Coughing up yellow, green, or bloody mucus    Wheezing or trouble breathing  Headache that does not go away    Easy bleeding or bruising    No bowel movement in 3 days or when you feel uncomfortable.    Diarrhea, 4 times in one day or diarrhea with lack of strength or a feeling of being dizzy    Pain in your mouth or throat that makes it hard to eat or drink    Nausea that stops you from eating or drinking and/or is not relieved by prescribed medicines    Throwing up    Lasting loss of appetite or rapid weight loss of five pounds in a week    Flu-like symptoms: fever, headache, muscle and joint aches, and fatigue (low energy, feeling weak)    A new rash or a rash that is not relieved by prescribed medicines    Signs of allergic reaction: swelling of the face, feeling like your tongue or throat are swelling, trouble breathing, rash, itching, fever, chills, feeling dizzy, and/or feeling that your heart is beating in a fast or not normal way. If this happens, call 911 for emergency care.    Signs of infusion reaction:  fever or shaking chills, flushing, facial swelling, feeling dizzy, headache, trouble breathing, rash, itching, chest tightness, or chest pain. If this happens, call 911 for emergency care.    While you are getting this drug, please tell your nurse right away if you have any pain, redness, or swelling at the site of the IV infusion.    Signs of possible liver problems: dark urine, pale bowel movements, bad stomach pain, feeling very tired and weak, unusual itching, or yellowing of the eyes or skin    Signs of tumor lysis: confusion or agitation, decreased urine, nausea/vomiting, diarrhea, muscle cramping, numbness and/or tingling, seizures    If you think you may be pregnant, or may have impregnated your partner   Reproduction Warnings    Pregnancy warning: This drug can have harmful effects on the unborn baby. Women of childbearing potential should use effective methods of birth control during your cancer treatment and for at least 6 months after treatment. Men with male partners of childbearing potential should use effective methods of birth control during your cancer treatment and for at least 3 months after your cancer treatment. Let your doctor know right away if you think you may be pregnant or may have impregnated your partner.    Breastfeeding warning: It is not known if this drug passes into breast milk. For this reason, women should not breastfeed during treatment and for at least 1 week after treatment because this drug could enter the breast milk and cause harm to a breastfeeding baby.    Fertility warning: In men this drug may affect your ability to have children in the future. Talk with your doctor or nurse if you plan to have children. Ask for information on sperm banking.  SELF CARE ACTIVITIES WHILE ON CHEMOTHERAPY/IMMUNOTHERAPY:  Hydration Increase your fluid intake and drink at least 64 ounces (2 liters) of water/decaffeinated beverages per day after treatment. You can still  have your cup of coffee or soda but these beverages do not count as part of the 64 ounces that you need to drink daily. Limit alcohol intake.  Medications Continue taking your normal prescription medication as prescribed.  If you start any new herbal or new supplements please let us know first to make sure it is safe.  Mouth Care Have teeth cleaned professionally before starting treatment. Keep dentures and partial plates clean. Use  soft toothbrush and do not use mouthwashes that contain alcohol. Biotene is a good mouthwash that is available at most pharmacies or may be ordered by calling [phone]. Use warm salt water gargles (1 teaspoon salt per 1 quart warm water) before and after meals and at bedtime. If you are still having problems with your mouth or sores in your mouth please call the clinic. If you need dental work, please let the doctor know before you go for your appointment so that we can coordinate the best possible time for you in regards to your chemo regimen. You need to also let your dentist know that you are actively taking chemo. We may need to do labs prior to your dental appointment.  Skin Care Always use sunscreen that has not expired and with SPF (Sun Protection Factor) of 50 or higher. Wear hats to protect your head from the sun. Remember to use sunscreen on your hands, ears, face, & feet.  Use good moisturizing lotions such as udder cream, eucerin, or even Vaseline. Some chemotherapies can cause dry skin, color changes in your skin and nails.    Avoid long, hot showers or baths. Use gentle, fragrance-free soaps and laundry detergent. Use moisturizers, preferably creams or ointments rather than lotions because the thicker consistency is better at preventing skin dehydration. Apply the cream or ointment within 15 minutes of showering. Reapply moisturizer at night, and moisturize your hands every time after you wash them.   Infection Prevention Please wash your hands for at  least 30 seconds using warm soapy water. Handwashing is the #1 way to prevent the spread of germs. Stay away from sick people or people who are getting over a cold. If you develop respiratory systems such as green/yellow mucus production or productive cough or persistent cough let us know and we will see if you need an antibiotic. It is a good idea to keep a pair of gloves on when going into grocery stores/[employer] to decrease your risk of coming into contact with germs on the carts, etc. Carry alcohol hand gel with you at all times and use it frequently if out in public. If your temperature reaches 100.5 or higher please call the clinic and let us know.  If it is after hours or on the weekend please go to the ER if your temperature is over 100.4.  Please have your own personal thermometer at home to use.    Sex and bodily fluids If you are going to have sex, a condom must be used to protect the person that isn't taking immunotherapy. For a few days after treatment, immunotherapy can be excreted through your bodily fluids.  When using the toilet please close the lid and flush the toilet twice.  Do this for a few day after you have had immunotherapy.   Contraception It is not known for sure whether or not immunotherapy drugs can be passed on through semen or secretions from the vagina. Because of this some doctors advise people to use a barrier method if you have sex during treatment. This applies to vaginal, anal or oral sex.  Generally, doctors advise a barrier method only for the time you are actually having the treatment and for about a week after your treatment.  Advice like this can be worrying, but this does not mean that you have to avoid being intimate with your partner. You can still have close contact with your partner and continue to enjoy sex.  Animals If you have cats  or birds we ask that you not change the litter or change the cage.  Please have someone else do this for you while you are on  immunotherapy.   Food Safety During and After Cancer Treatment Food safety is important for people both during and after cancer treatment. Cancer and cancer treatments, such as chemotherapy, radiation therapy, and stem cell/bone marrow transplantation, often weaken the immune system. This makes it harder for your body to protect itself from foodborne illness, also called food poisoning. Foodborne illness is caused by eating food that contains harmful bacteria, parasites, or viruses.  Foods to avoid Some foods have a higher risk of becoming tainted with bacteria. These include: Unwashed fresh fruit and vegetables, especially leafy vegetables that can hide dirt and other contaminants Raw sprouts, such as alfalfa sprouts Raw or undercooked beef, especially ground beef, or other raw or undercooked meat and poultry Fatty, fried, or spicy foods immediately before or after treatment.  These can sit heavy on your stomach and make you feel nauseous. Raw or undercooked shellfish, such as oysters. Sushi and sashimi, which often contain raw fish.  Unpasteurized beverages, such as unpasteurized fruit juices, raw milk, raw yogurt, or cider Undercooked eggs, such as soft boiled, over easy, and poached; raw, unpasteurized eggs; or foods made with raw egg, such as homemade raw cookie dough and homemade mayonnaise  Simple steps for food safety  Shop smart. Do not buy food stored or displayed in an unclean area. Do not buy bruised or damaged fruits or vegetables. Do not buy cans that have cracks, dents, or bulges. Pick up foods that can spoil at the end of your shopping trip and store them in a cooler on the way home.  Prepare and clean up foods carefully. Rinse all fresh fruits and vegetables under running water, and dry them with a clean towel or paper towel. Clean the top of cans before opening them. After preparing food, wash your hands for 20 seconds with hot water and soap. Pay special attention to  areas between fingers and under nails. Clean your utensils and dishes with hot water and soap. Disinfect your kitchen and cutting boards using 1 teaspoon of liquid, unscented bleach mixed into 1 quart of water.    Dispose of old food. Eat canned and packaged food before its expiration date (the "use by" or "best before" date). Consume refrigerated leftovers within 3 to 4 days. After that time, throw out the food. Even if the food does not smell or look spoiled, it still may be unsafe. Some bacteria, such as Listeria, can grow even on foods stored in the refrigerator if they are kept for too long.  Take precautions when eating out. At restaurants, avoid buffets and salad bars where food sits out for a long time and comes in contact with many people. Food can become contaminated when someone with a virus, often a norovirus, or another "bug" handles it. Put any leftover food in a "to-go" container yourself, rather than having the server do it. And, refrigerate leftovers as soon as you get home. Choose restaurants that are clean and that are willing to prepare your food as you order it cooked.    SYMPTOMS TO REPORT AS SOON AS POSSIBLE AFTER TREATMENT:  FEVER GREATER THAN 100.4 F CHILLS WITH OR WITHOUT FEVER NAUSEA AND VOMITING THAT IS NOT CONTROLLED WITH YOUR NAUSEA MEDICATION UNUSUAL SHORTNESS OF BREATH UNUSUAL BRUISING OR BLEEDING TENDERNESS IN MOUTH AND THROAT WITH OR WITHOUT PRESENCE OF ULCERS URINARY PROBLEMS  BOWEL PROBLEMS UNUSUAL RASH     Wear comfortable clothing and clothing appropriate for easy access to any Portacath or PICC line. Let us know if there is anything that we can do to make your therapy better!   What to do if you need assistance after hours or on the weekends: CALL [phone].  HOLD on the line, do not hang up.  You will hear multiple messages but at the end you will be connected with a nurse triage line.  They will contact the doctor if necessary.  Most of the  time they will be able to assist you.  Do not call the hospital operator.    I have been informed and understand all of the instructions given to me and have received a copy. I have been instructed to call the clinic [phone] or my family physician as soon as possible for continued medical care, if indicated. I do not have any more questions at this time but understand that I may call the Cancer Center or the Patient Navigator at [phone] during office hours should I have questions or need assistance in obtaining follow-up care.

## 2024-02-21 ENCOUNTER — Inpatient Hospital Stay

## 2024-02-25 ENCOUNTER — Inpatient Hospital Stay

## 2024-02-25 ENCOUNTER — Other Ambulatory Visit: Payer: Self-pay

## 2024-02-25 ENCOUNTER — Other Ambulatory Visit: Payer: Self-pay | Admitting: *Deleted

## 2024-02-25 DIAGNOSIS — C8259 Diffuse follicle center lymphoma, extranodal and solid organ sites: Secondary | ICD-10-CM

## 2024-02-25 MED ORDER — LIDOCAINE-PRILOCAINE 2.5-2.5 % EX CREA: TOPICAL_CREAM | CUTANEOUS | 3 refills | Status: DC

## 2024-02-25 MED ORDER — DEXAMETHASONE 4 MG PO TABS: 8.0000 mg | ORAL_TABLET | Freq: Every day | ORAL | 1 refills | Status: DC

## 2024-02-25 MED ORDER — ACYCLOVIR 400 MG PO TABS: 400.0000 mg | ORAL_TABLET | Freq: Every day | ORAL | 5 refills | Status: DC

## 2024-02-25 MED ORDER — ALLOPURINOL 300 MG PO TABS: 300.0000 mg | ORAL_TABLET | Freq: Every day | ORAL | 0 refills | Status: DC

## 2024-02-25 MED ORDER — PROCHLORPERAZINE MALEATE 10 MG PO TABS: 10.0000 mg | ORAL_TABLET | Freq: Four times a day (QID) | ORAL | 1 refills | Status: DC | PRN

## 2024-02-25 MED ORDER — PROCHLORPERAZINE MALEATE 10 MG PO TABS
10.0000 mg | ORAL_TABLET | Freq: Four times a day (QID) | ORAL | 1 refills | Status: DC | PRN
Start: 2024-02-25 — End: 2024-02-25

## 2024-02-25 MED ORDER — SULFAMETHOXAZOLE-TRIMETHOPRIM 800-160 MG PO TABS: 1.0000 | ORAL_TABLET | ORAL | 5 refills | Status: DC

## 2024-02-25 MED ORDER — ONDANSETRON HCL 8 MG PO TABS: 8.0000 mg | ORAL_TABLET | Freq: Three times a day (TID) | ORAL | 1 refills | Status: DC | PRN

## 2024-02-25 MED ORDER — LIDOCAINE-PRILOCAINE 2.5-2.5 % EX CREA: TOPICAL_CREAM | CUTANEOUS | 3 refills | Status: DC | PRN

## 2024-02-25 MED ORDER — DEXAMETHASONE 4 MG PO TABS
8.0000 mg | ORAL_TABLET | Freq: Every day | ORAL | 1 refills | Status: DC
Start: 2024-02-25 — End: 2024-02-25

## 2024-02-25 MED ORDER — ALLOPURINOL 300 MG PO TABS
300.0000 mg | ORAL_TABLET | Freq: Every day | ORAL | 0 refills | Status: DC
Start: 2024-02-25 — End: 2024-04-01

## 2024-02-25 NOTE — Progress Notes (Signed)
 Pharmacist Chemotherapy Monitoring - Initial Assessment    Anticipated start date: 03/03/24   The following has been reviewed per standard work regarding the patient's treatment regimen: The patient's diagnosis, treatment plan and drug doses, and organ/hematologic function Lab orders and baseline tests specific to treatment regimen  The treatment plan start date, drug sequencing, and pre-medications Prior authorization status  Patient's documented medication list, including drug-drug interaction screen and prescriptions for anti-emetics and supportive care specific to the treatment regimen The drug concentrations, fluid compatibility, administration routes, and timing of the medications to be used The patient's access for treatment and lifetime cumulative dose history, if applicable  The patient's medication allergies and previous infusion related reactions, if applicable   Changes made to treatment plan:  pre-medications    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  Famotidine 20 mg IVPB x 1 premedication.  T.O. Dr Carilyn Goodpasture, PharmD   Follow up needed:  N/A   Stephens Shire, [facility], 02/25/2024  3:01 PM

## 2024-02-25 NOTE — Progress Notes (Signed)

## 2024-02-27 ENCOUNTER — Other Ambulatory Visit (HOSPITAL_COMMUNITY): Payer: Self-pay | Admitting: Student

## 2024-02-28 ENCOUNTER — Other Ambulatory Visit: Payer: Self-pay

## 2024-02-28 ENCOUNTER — Ambulatory Visit (HOSPITAL_COMMUNITY)
Admission: RE | Admit: 2024-02-28 | Discharge: 2024-02-28 | Disposition: A | Discharge status: Home / Self Care | Source: Ambulatory Visit | Attending: Hematology | Admitting: Hematology

## 2024-02-28 ENCOUNTER — Encounter (HOSPITAL_COMMUNITY): Payer: Self-pay

## 2024-02-28 DIAGNOSIS — K219 Gastro-esophageal reflux disease without esophagitis: Secondary | ICD-10-CM | POA: Diagnosis not present

## 2024-02-28 DIAGNOSIS — C8259 Diffuse follicle center lymphoma, extranodal and solid organ sites: Secondary | ICD-10-CM | POA: Insufficient documentation

## 2024-02-28 DIAGNOSIS — I10 Essential (primary) hypertension: Secondary | ICD-10-CM | POA: Insufficient documentation

## 2024-02-28 DIAGNOSIS — Z87891 Personal history of nicotine dependence: Secondary | ICD-10-CM | POA: Insufficient documentation

## 2024-02-28 DIAGNOSIS — Z794 Long term (current) use of insulin: Secondary | ICD-10-CM | POA: Insufficient documentation

## 2024-02-28 DIAGNOSIS — E119 Type 2 diabetes mellitus without complications: Secondary | ICD-10-CM | POA: Diagnosis not present

## 2024-02-28 DIAGNOSIS — Z7985 Long-term (current) use of injectable non-insulin antidiabetic drugs: Secondary | ICD-10-CM | POA: Insufficient documentation

## 2024-02-28 HISTORY — PX: IR IMAGING GUIDED PORT INSERTION: IMG5740

## 2024-02-28 LAB — GLUCOSE, CAPILLARY
Glucose-Capillary: 113 mg/dL — ABNORMAL HIGH (ref 70–99)
Glucose-Capillary: 118 mg/dL — ABNORMAL HIGH (ref 70–99)

## 2024-02-28 MED ORDER — LIDOCAINE-EPINEPHRINE 1 %-1:100000 IJ SOLN
INTRAMUSCULAR | Status: AC
  Filled 2024-02-28: qty 1

## 2024-02-28 MED ORDER — HEPARIN SOD (PORK) LOCK FLUSH 100 UNIT/ML IV SOLN: 500.0000 [IU] | Freq: Once | INTRAVENOUS | Status: DC

## 2024-02-28 MED ORDER — HEPARIN SOD (PORK) LOCK FLUSH 100 UNIT/ML IV SOLN
INTRAVENOUS | Status: AC
  Filled 2024-02-28: qty 5

## 2024-02-28 MED ORDER — LIDOCAINE-EPINEPHRINE (PF) 1 %-1:200000 IJ SOLN: 20.0000 mL | Freq: Once | INTRAMUSCULAR | Status: DC

## 2024-02-28 MED ORDER — MIDAZOLAM HCL 2 MG/2ML IJ SOLN
INTRAMUSCULAR | Status: AC | PRN
Start: 2024-02-28 — End: 2024-02-28
  Administered 2024-02-28 (×2): .5 mg via INTRAVENOUS
  Administered 2024-02-28: 1 mg via INTRAVENOUS

## 2024-02-28 MED ORDER — MIDAZOLAM HCL 2 MG/2ML IJ SOLN
INTRAMUSCULAR | Status: AC
  Filled 2024-02-28: qty 2

## 2024-02-28 MED ORDER — FENTANYL CITRATE (PF) 100 MCG/2ML IJ SOLN
INTRAMUSCULAR | Status: AC | PRN
  Administered 2024-02-28: 25 ug via INTRAVENOUS
  Administered 2024-02-28: 50 ug via INTRAVENOUS
  Administered 2024-02-28: 25 ug via INTRAVENOUS

## 2024-02-28 MED ORDER — FENTANYL CITRATE (PF) 100 MCG/2ML IJ SOLN
INTRAMUSCULAR | Status: AC
Start: 2024-02-28 — End: ?
  Filled 2024-02-28: qty 2

## 2024-02-28 NOTE — H&P (Signed)
 Chief Complaint: Patient was seen in consultation today for diffuse follicle center lymphoma  Referring Physician(s): Katragadda,Sreedhar  Supervising Physician: Gilmer Mor  Patient Status: [facility] - Out-pt  History of Present Illness: Shawn Fleming is a 68 y.o. 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  He now has plans for upcoming chemotherapy and is in need of durable venous access.   Patient presents to [facility] Radiology for procedure today in his usual state of health.   He has been NPO. His son is available for transportation home and post-procedure care.  He is agreeable to the procedure today.   Past Medical History:  Diagnosis Date   AKI (acute kidney injury) (HCC) 07/29/2020   Arthritis    Diabetes (HCC)    GERD (gastroesophageal reflux disease)    History of kidney stones    Hypercholesteremia    Hypertension    Migraine    Sleep apnea     Past Surgical History:  Procedure Laterality Date   ADENOIDECTOMY     APPENDECTOMY     CENTRAL VENOUS CATHETER INSERTION Left 04/20/2013   Procedure: INSERTION CENTRAL LINE LEFT SUBCLAVIAN;  Surgeon: Fabio Bering, MD;  Location: [facility] ORS;  Service: General;  Laterality: Left;   INCISION AND DRAINAGE PERIRECTAL ABSCESS N/A 04/20/2013   Procedure: SHARP SURGICAL DEBRIDEMENT PERINEAL INFECTION;  Surgeon: Fabio Bering, MD;  Location: [facility] OR[facility];  Service: General;  Laterality: N/A;   KNEE SURGERY Right    arthroscopic   SHOULDER SURGERY Bilateral    removal of bone.   TONSILLECTOMY      Allergies: Prednisone  Medications: Prior to Admission medications   Medication Sig Start Date End Date Taking? Authorizing Provider  ALPRAZolam Prudy Feeler) 0.5 MG tablet Take 0.5 mg by mouth 2 (two) times daily.   Yes [provider]  apixaban (ELIQUIS) 5 MG TABS tablet Take 1 tablet (5 mg total) by mouth 2 (two) times daily. Please start around August 31, 2020 after you complete the initial starter pack 08/31/20  Yes Emokpae, Courage, MD  Cholecalciferol (VITAMIN D3) 5000 units CAPS Take 2 capsules by mouth daily.    Yes [provider]  furosemide (LASIX) 40 MG tablet Take 40 mg by mouth daily. 07/27/22  Yes [provider]  gabapentin (NEURONTIN) 600 MG tablet Take 600 mg by mouth 3 (three) times daily. 01/02/24  Yes [provider]  guaiFENesin-dextromethorphan (ROBITUSSIN DM) 100-10 MG/5ML syrup Take 10 mLs by mouth every 4 (four) hours as needed for cough (chest congestion). 02/01/24  Yes Mdala-Gausi, Gwenette Greet, MD  hydrocortisone (CORTEF) 10 MG tablet TAKE 1 TABLET DAILY AT 8 A.M. AND 1 TABLET DAILY AT NOON 01/17/24  Yes Nida, Denman George, MD  insulin glargine (LANTUS) 100 UNIT/ML Solostar Pen Inject 50 Units into the skin daily.   Yes [provider]  Multiple Vitamins-Minerals (MULTIVITAMIN PO) Take 1 tablet by mouth daily.   Yes [provider]  Omega-3 Fatty Acids (FISH OIL) 1200 MG CAPS Take 1 capsule by mouth daily.   Yes [provider]  Oxycodone HCl 10 MG TABS Take 10 mg by mouth 4 (four) times daily. 07/05/20  Yes [provider]  pantoprazole (PROTONIX) 40 MG tablet Take 40 mg by mouth daily. 03/21/20  Yes [provider]  potassium chloride (KLOR-CON) 10 MEQ tablet Take 10 mEq by mouth daily. 07/11/22  Yes [provider]  rosuvastatin (CRESTOR) 10 MG tablet  Take 10 mg by mouth daily.   Yes [provider]  tirzepatide Greggory Keen) 12.5 MG/0.5ML Pen Inject 12.5 mg into the skin once a week. 07/23/23  Yes   acyclovir (ZOVIRAX) 400 MG tablet Take 1 tablet (400 mg total) by mouth daily. 02/25/24   Doreatha Massed, MD  allopurinol (ZYLOPRIM) 300 MG tablet Take 1 tablet (300 mg total) by mouth daily. 02/25/24   Doreatha Massed, MD  benzonatate (TESSALON) 200 MG capsule Take 1 capsule (200 mg total) by mouth 3 (three) times daily. For cough 02/01/24    Mdala-Gausi, Gwenette Greet, MD  dexamethasone (DECADRON) 4 MG tablet Take 2 tablets (8 mg total) by mouth daily. Start the day after chemotherapy for 2 days. Take with food. 02/25/24   Doreatha Massed, MD  lidocaine-prilocaine (EMLA) cream Apply topically as needed (Place small amount to port site 30 minutes prior to port access.). Apply to affected area once 02/25/24   Doreatha Massed, MD  ondansetron (ZOFRAN) 8 MG tablet Take 1 tablet (8 mg total) by mouth every 8 (eight) hours as needed for nausea or vomiting. Start on the second day after chemotherapy. Then take as needed for nausea or vomiting. 02/25/24   Doreatha Massed, MD  Palonosetron HCl (ALOXI IV) Inject into the vein.    [provider]  prochlorperazine (COMPAZINE) 10 MG tablet Take 1 tablet (10 mg total) by mouth every 6 (six) hours as needed for nausea or vomiting. 02/25/24   Doreatha Massed, MD  sulfamethoxazole-trimethoprim (BACTRIM DS) 800-160 MG tablet Take 1 tablet by mouth 3 (three) times a week. 02/25/24   Doreatha Massed, MD     Family History  Problem Relation Age of Onset   Cancer Mother    Dementia Father    Colon cancer Neg Hx    Rectal cancer Neg Hx    Stomach cancer Neg Hx    Esophageal cancer Neg Hx     Social History   Socioeconomic History   Marital status: Divorced    Spouse name: Not on file   Number of children: 6   Years of education: Not on file   Highest education level: Not on file  Occupational History   Occupation: Engineer, materials  Tobacco Use   Smoking status: Former    Current packs/day: 0.50    Average packs/day: 0.5 packs/day for 23.0 years (11.5 ttl pk-yrs)    Types: Cigarettes   Smokeless tobacco: Never  Vaping Use   Vaping status: Former  Substance and Sexual Activity   Alcohol use: Yes    Comment: rare   Drug use: No   Sexual activity: Not Currently    Birth control/protection: None  Other Topics Concern   Not on file  Social History  Narrative   Not on file   Social Drivers of Health   Financial Resource Strain: Not on file  Food Insecurity: No Food Insecurity (02/05/2024)   Hunger Vital Sign    Worried About Running Out of Food in the Last Year: Never true    Ran Out of Food in the Last Year: Never true  Transportation Needs: No Transportation Needs (02/05/2024)   PRAPARE - Administrator, Civil Service (Medical): No    Lack of Transportation (Non-Medical): No  Physical Activity: Not on file  Stress: Not on file  Social Connections: Patient Declined (01/31/2024)   Social Connection and Isolation Panel [NHANES]    Frequency of Communication with Friends and Family: Patient declined    Frequency of Social  Gatherings with Friends and Family: Patient declined    Attends Religious Services: Patient declined    Database administrator or Organizations: Patient declined    Attends Banker Meetings: Patient declined    Marital Status: Patient declined     Review of Systems: A 12 point ROS discussed and pertinent positives are indicated in the HPI above.  All other systems are negative.  Review of Systems  Constitutional:  Negative for fatigue and fever.  Respiratory:  Negative for cough and shortness of breath.   Cardiovascular:  Negative for chest pain.  Gastrointestinal:  Negative for abdominal pain, nausea and vomiting.  Musculoskeletal:  Negative for back pain.  Psychiatric/Behavioral:  Negative for behavioral problems and confusion.     Vital Signs: BP 119/80   Pulse 86   Temp 97.7 F (36.5 C) (Oral)   Resp 19   Ht 5\' 5"  (1.651 m)   Wt 212 lb (96.2 kg)   SpO2 92%   BMI 35.28 kg/m   Physical Exam Vitals and nursing note reviewed.  Constitutional:      General: He is not in acute distress.    Appearance: Normal appearance. He is not ill-appearing.  HENT:     Mouth/Throat:     Mouth: Mucous membranes are moist.     Pharynx: Oropharynx is clear.  Cardiovascular:     Rate  and Rhythm: Normal rate and regular rhythm.  Pulmonary:     Effort: Pulmonary effort is normal. No respiratory distress.     Breath sounds: Normal breath sounds.  Abdominal:     General: Abdomen is flat. There is no distension.     Palpations: Abdomen is soft.  Skin:    General: Skin is warm and dry.  Neurological:     General: No focal deficit present.     Mental Status: He is oriented to person, place, and time. Mental status is at baseline.  Psychiatric:        Mood and Affect: Mood normal.        Behavior: Behavior normal.        Thought Content: Thought content normal.        Judgment: Judgment normal.      MD Evaluation Airway: WNL Heart: WNL Abdomen: WNL Chest/ Lungs: WNL ASA  Classification: 3 Mallampati/Airway Score: Two   Imaging: [place] CORE BIOPSY (LYMPH NODES) Result Date: 02/07/2024 INDICATION: Hypermetabolic soft tissue within the left masseter space and left submandibular lymph nodes. Patient presents for ultrasound-guided core biopsy of both regions. EXAM: ULTRASOUND BIOPSY OF THE LYMPH NODES; MUSCLE/SOFT TISSUE CORE BIOPSY MEDICATIONS: None. ANESTHESIA/SEDATION: Moderate (conscious) sedation was employed during this procedure. A total of Versed 1.5 mg and Fentanyl 50 mcg was administered intravenously by the Radiology nurse. Moderate Sedation Time: 14 minutes. The patient's level of consciousness and vital signs were monitored continuously by radiology nursing throughout the procedure under my direct supervision. FLUOROSCOPY TIME:  None. COMPLICATIONS: None immediate. PROCEDURE: Informed written consent was obtained from the patient after a thorough discussion of the procedural risks, benefits and alternatives. All questions were addressed. Maximal Sterile Barrier Technique was utilized including caps, mask, sterile gowns, sterile gloves, sterile drape, hand hygiene and skin antiseptic. A timeout was performed prior to the initiation of the procedure. LEFT FACE MASS  Ultrasound was used to interrogate the left face and deep masticator space. There is an ill-defined hypoechoic soft tissue mass in the region. The overlying skin was sterilely prepped and draped in the standard fashion  using chlorhexidine skin prep. Local anesthesia was attained by infiltration with 1% lidocaine. A small dermatotomy was made. Under real-time ultrasound guidance, multiple 18 gauge core biopsies were obtained of the soft tissue mass. Biopsy specimens were placed in ultrasound and delivered to pathology for further analysis. Post biopsy imaging demonstrates no evidence of complication. LEFT SUBMANDIBULAR LYMPHADENOPATHY Ultrasound was next used to investigate the left submandibular space. A large abnormal hypoechoic lymph node is identified. A suitable skin entry site was selected and marked. The overlying skin was sterilely prepped and draped in the standard fashion using chlorhexidine skin prep. Local anesthesia was attained by infiltration with 1% lidocaine. A small dermatotomy was made. Under real-time ultrasound guidance, multiple 18 gauge core biopsies were obtained coaxially. Biopsy specimens were placed in saline and delivered to pathology for further analysis. Post biopsy imaging demonstrates no evidence of complication. The patient tolerated both procedures well. IMPRESSION: 1. Successful ultrasound-guided core biopsy of left face/masseter space lesion. 2. Successful ultrasound-guided core biopsy of left submandibular lymph node. Electronically Signed   By: Malachy Moan M.D.   On: 02/07/2024 11:30   [place] CORE BIOPSY (SOFT TISSUE) Result Date: 02/07/2024 INDICATION: Hypermetabolic soft tissue within the left masseter space and left submandibular lymph nodes. Patient presents for ultrasound-guided core biopsy of both regions. EXAM: ULTRASOUND BIOPSY OF THE LYMPH NODES; MUSCLE/SOFT TISSUE CORE BIOPSY MEDICATIONS: None. ANESTHESIA/SEDATION: Moderate (conscious) sedation was employed during  this procedure. A total of Versed 1.5 mg and Fentanyl 50 mcg was administered intravenously by the Radiology nurse. Moderate Sedation Time: 14 minutes. The patient's level of consciousness and vital signs were monitored continuously by radiology nursing throughout the procedure under my direct supervision. FLUOROSCOPY TIME:  None. COMPLICATIONS: None immediate. PROCEDURE: Informed written consent was obtained from the patient after a thorough discussion of the procedural risks, benefits and alternatives. All questions were addressed. Maximal Sterile Barrier Technique was utilized including caps, mask, sterile gowns, sterile gloves, sterile drape, hand hygiene and skin antiseptic. A timeout was performed prior to the initiation of the procedure. LEFT FACE MASS Ultrasound was used to interrogate the left face and deep masticator space. There is an ill-defined hypoechoic soft tissue mass in the region. The overlying skin was sterilely prepped and draped in the standard fashion using chlorhexidine skin prep. Local anesthesia was attained by infiltration with 1% lidocaine. A small dermatotomy was made. Under real-time ultrasound guidance, multiple 18 gauge core biopsies were obtained of the soft tissue mass. Biopsy specimens were placed in ultrasound and delivered to pathology for further analysis. Post biopsy imaging demonstrates no evidence of complication. LEFT SUBMANDIBULAR LYMPHADENOPATHY Ultrasound was next used to investigate the left submandibular space. A large abnormal hypoechoic lymph node is identified. A suitable skin entry site was selected and marked. The overlying skin was sterilely prepped and draped in the standard fashion using chlorhexidine skin prep. Local anesthesia was attained by infiltration with 1% lidocaine. A small dermatotomy was made. Under real-time ultrasound guidance, multiple 18 gauge core biopsies were obtained coaxially. Biopsy specimens were placed in saline and delivered to pathology  for further analysis. Post biopsy imaging demonstrates no evidence of complication. The patient tolerated both procedures well. IMPRESSION: 1. Successful ultrasound-guided core biopsy of left face/masseter space lesion. 2. Successful ultrasound-guided core biopsy of left submandibular lymph node. Electronically Signed   By: Malachy Moan M.D.   On: 02/07/2024 11:30   EEG adult Result Date: 01/31/2024 Charlsie Quest, MD     01/31/2024  5:13 PM Patient Name: Shawn Fleming  Shawn Fleming MRN: [id number] Epilepsy Attending: Charlsie Quest Referring Physician/Provider: Gloris Manchester, MD Date: 01/31/2024 Duration: 22.11 mins Patient history: 68yo m with transient episode of altered mental status, confusion, had urine incontinence. EEG to evaluate for seizure Level of alertness: Awake AEDs during EEG study: None Technical aspects: This EEG study was done with scalp electrodes positioned according to the 10-20 International system of electrode placement. Electrical activity was reviewed with band pass filter of 1-70Hz , sensitivity of 7 uV/mm, display speed of 32mm/sec with a 60Hz  notched filter applied as appropriate. EEG data were recorded continuously and digitally stored.  Video monitoring was available and reviewed as appropriate. Description: The posterior dominant rhythm consists of 9 Hz activity of moderate voltage (25-35 uV) seen predominantly in posterior head regions, symmetric and reactive to eye opening and eye closing. Hyperventilation and photic stimulation were not performed.   IMPRESSION: This study is within normal limits. No seizures or epileptiform discharges were seen throughout the recording. A normal interictal EEG does not exclude the diagnosis of epilepsy. Charlsie Quest   MR Brain W and Wo Contrast Result Date: 01/30/2024 CLINICAL DATA:  Metastatic disease evaluation. Confusion. Fall. History of hematologic malignancy and lymphadenopathy. EXAM: MRI HEAD WITHOUT AND WITH CONTRAST TECHNIQUE:  Multiplanar, multiecho pulse sequences of the brain and surrounding structures were obtained without and with intravenous contrast. CONTRAST:  10mL GADAVIST GADOBUTROL 1 MMOL/ML IV SOLN COMPARISON:  Head CT same day.  PET scan 01/23/2024 FINDINGS: Brain: Fusion imaging does not show any acute or subacute infarction. No focal abnormality affects the brainstem or cerebellum. Cerebral hemispheres show mild chronic small-vessel ischemic changes of the white matter. No cortical or large vessel territory infarction. No intracranial mass lesion, hemorrhage, hydrocephalus or extra-axial collection. No abnormal brain or leptomeningeal enhancement occurs. Vascular: Major vessels at the base of the brain show flow. Skull and upper cervical spine: No bone abnormality seen. Sinuses/Orbits: Mucosal inflammatory changes of the paranasal sinuses. Orbits negative. Other: Soft tissue masses of the scalp and the masticator space on the left as shown previously. IMPRESSION: 1. No acute or reversible intracranial finding. No evidence of intracranial metastatic disease. Mild chronic small-vessel ischemic changes of the cerebral hemispheric white matter. 2. Soft tissue masses of the scalp and masticator space on the left as shown previously. Electronically Signed   By: Paulina Fusi M.D.   On: 01/30/2024 18:35   CT Angio Chest/Abd/Pel for Dissection W and/or Wo Contrast Result Date: 01/30/2024 CLINICAL DATA:  Larey Seat yesterday, seizure, low back pain, cough, history of hematologic malignancy and head and neck lymphadenopathy EXAM: CT ANGIOGRAPHY CHEST, ABDOMEN AND PELVIS TECHNIQUE: Non-contrast CT of the chest was initially obtained. Multidetector CT imaging through the chest, abdomen and pelvis was performed using the standard protocol during bolus administration of intravenous contrast. Multiplanar reconstructed images and MIPs were obtained and reviewed to evaluate the vascular anatomy. RADIATION DOSE REDUCTION: This exam was performed  according to the departmental dose-optimization program which includes automated exposure control, adjustment of the mA and/or kV according to patient size and/or use of iterative reconstruction technique. CONTRAST:  75mL OMNIPAQUE IOHEXOL 350 MG/ML SOLN COMPARISON:  01/30/2024, 01/23/2024 FINDINGS: CTA CHEST FINDINGS Cardiovascular: No evidence of thoracic aortic aneurysm or dissection. The heart is unremarkable without pericardial effusion. There is adequate opacification of the central and segmental pulmonary arteries, with no filling defects or pulmonary emboli identified. Atherosclerosis of the aorta and coronary vasculature. Mediastinum/Nodes: No enlarged mediastinal, hilar, or axillary lymph nodes. Thyroid gland, trachea, and esophagus demonstrate  no significant findings. Lungs/Pleura: 1.8 x 1.4 cm spiculated left hilar mass encasing the left lower lobe superior segment bronchus, reference image 60/6. This area appears hypermetabolic on recent PET scan, and is highly concerning for malignancy. Bronchoscopy may be useful for further evaluation. No acute airspace disease, effusion, or pneumothorax. Central airways are patent. Musculoskeletal: No acute or destructive bony abnormalities. Reconstructed images demonstrate no additional findings. Review of the MIP images confirms the above findings. CTA ABDOMEN AND PELVIS FINDINGS VASCULAR Aorta: Normal caliber aorta without aneurysm, dissection, vasculitis or significant stenosis. Mild atherosclerosis. Celiac: Patent without evidence of aneurysm, dissection, vasculitis or significant stenosis. SMA: Patent without evidence of aneurysm, dissection, vasculitis or significant stenosis. Renals: Both renal arteries are patent without evidence of aneurysm, dissection, vasculitis, fibromuscular dysplasia or significant stenosis. Mild atherosclerosis within the proximal left renal artery. IMA: Patent without evidence of aneurysm, dissection, vasculitis or significant  stenosis. Inflow: Patent without evidence of aneurysm, dissection, vasculitis or significant stenosis. Veins: No obvious venous abnormality within the limitations of this arterial phase study. Review of the MIP images confirms the above findings. NON-VASCULAR Hepatobiliary: No focal liver abnormality is seen. No gallstones, gallbladder wall thickening, or biliary dilatation. Pancreas: Unremarkable. No pancreatic ductal dilatation or surrounding inflammatory changes. Spleen: Normal in size without focal abnormality. Adrenals/Urinary Tract: Kidneys enhance normally and symmetrically. No urinary tract calculi or obstructive uropathy. The adrenals are unremarkable. There is a 1.6 x 1.4 x 1.2 cm hyperdense mass along the posterior aspect of the bladder just superior to the right UVJ, reference image 164/4. Cystoscopy is recommended for further evaluation. Stomach/Bowel: No bowel obstruction or ileus. The appendix is surgically absent. Diverticulosis of the descending and sigmoid colon without evidence of diverticulitis. No bowel wall thickening or inflammatory change. Lymphatic: Stable borderline enlarged lymph nodes within the bilateral inguinal regions. No other pathologic adenopathy. Reproductive: Prostate is unremarkable. Other: No free fluid or free intraperitoneal gas. Stable fat containing left inguinal hernia. No bowel herniation. Musculoskeletal: No acute or destructive bony abnormalities. Reconstructed images demonstrate no additional findings. Review of the MIP images confirms the above findings. IMPRESSION: Vascular: 1. No evidence of thoracoabdominal aortic aneurysm or dissection. 2. No evidence of pulmonary embolus. 3.  Aortic Atherosclerosis (ICD10-I70.0). Nonvascular: 1. 1.8 x 1.4 cm spiculated left hilar mass, encasing the left lower lobe superior segmental bronchus at its origin, highly concerning for malignancy. This area appears hypermetabolic on recent PET scan. Bronchoscopy may be useful further  evaluation. 2. 1.6 cm hyperdense mass along the posterior wall the bladder. Cystoscopy recommended for further evaluation. 3. Stable borderline enlarged bilateral inguinal lymph nodes, nonspecific. 4. Distal colonic diverticulosis without diverticulitis. Electronically Signed   By: Sharlet Salina M.D.   On: 01/30/2024 15:42   CT Head Wo Contrast Result Date: 01/30/2024 CLINICAL DATA:  Larey Seat yesterday, seizure, history of hematologic malignancy and lymphadenopathy EXAM: CT HEAD WITHOUT CONTRAST TECHNIQUE: Contiguous axial images were obtained from the base of the skull through the vertex without intravenous contrast. RADIATION DOSE REDUCTION: This exam was performed according to the departmental dose-optimization program which includes automated exposure control, adjustment of the mA and/or kV according to patient size and/or use of iterative reconstruction technique. COMPARISON:  12/28/2023, 01/23/2024 FINDINGS: Brain: No acute infarct or hemorrhage. Lateral ventricles and midline structures are unremarkable. No acute extra-axial fluid collections. No mass effect. Vascular: No hyperdense vessel or unexpected calcification. Skull: Soft tissue nodularity along the outer table of the left parietal region of the calvarium is again noted, measuring up to 7  mm in thickness reference image 44/3, corresponding to hypermetabolic deposits on recent PET scan. Negative for fracture or focal lesion. Sinuses/Orbits: Enlargement of the left muscles of mastication is noted, measuring 4.1 x 2.2 cm reference image 11/3, previously measuring 3.8 x 1.8 cm. This was noted to be hypermetabolic on recent PET scan 01/23/2024. Stable left periauricular adenopathy, measuring up to 10 mm in short axis reference image 3/3, also hypermetabolic on recent PET scan. Paranasal sinuses are unremarkable. Other: None. IMPRESSION: 1. No acute infarct or hemorrhage. 2. Stable enlargement of the left muscles of mastication, subcutaneous soft tissue  nodularity along the left parietal region of the calvarium, and left periauricular adenopathy, consistent with underlying malignancy as noted on recent PET scan. Electronically Signed   By: Sharlet Salina M.D.   On: 01/30/2024 15:28   DG Chest Portable 1 View Result Date: 01/30/2024 CLINICAL DATA:  Cough EXAM: PORTABLE CHEST 1 VIEW COMPARISON:  02/13/2019 FINDINGS: The heart size and mediastinal contours are within normal limits. Both lungs are clear. The visualized skeletal structures are unremarkable. IMPRESSION: No active disease. Electronically Signed   By: Duanne Guess D.O.   On: 01/30/2024 11:04   CT Lumbar Spine Wo Contrast Result Date: 01/30/2024 CLINICAL DATA:  Low back pain with cauda equina syndrome suspected EXAM: CT LUMBAR SPINE WITHOUT CONTRAST TECHNIQUE: Multidetector CT imaging of the lumbar spine was performed without intravenous contrast administration. Multiplanar CT image reconstructions were also generated. RADIATION DOSE REDUCTION: This exam was performed according to the departmental dose-optimization program which includes automated exposure control, adjustment of the mA and/or kV according to patient size and/or use of iterative reconstruction technique. COMPARISON:  Lumbar MRI 01/11/2024 FINDINGS: Segmentation: 5 lumbar type vertebrae. Alignment: Normal. Vertebrae: No acute fracture or focal pathologic process. Paraspinal and other soft tissues: Negative. Disc levels: T12- L1: Spondylitic spurring.  No neural impingement. L1-L2: Disc space narrowing with ventral spondylitic spurring. No neural impingement. L2-L3: Disc space narrowing with mild endplate and facet spurring. No neural compression. L3-L4: Disc space narrowing with endplate and facet spurring. Mild narrowing of the thecal sac. L4-L5: Disc narrowing with endplate and moderate facet spurring. Mild narrowing of the thecal sac and foramina. L5-S1:Disc space narrowing with endplate ridging. Degenerative facet spurring which  is asymmetrically bulky on the right. IMPRESSION: 1. No acute finding. 2. 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 Electronically Signed   By: Tiburcio Pea M.D.   On: 01/30/2024 11:00    Labs:  CBC: Recent Labs    01/15/24 1413 01/30/24 0924 01/31/24 0254  WBC 10.7* 10.0 5.4  HGB 14.8 13.5  13.6 12.2*  HCT 44.3 39.4  40.0 36.2*  PLT 261 196 152    COAGS: No results for input(s): "INR", "APTT" in the last 8760 hours.  BMP: Recent Labs    01/15/24 1413 01/22/24 1307 01/30/24 0924 01/31/24 0254 02/01/24 0459  NA 137 143 134*  136 138 138  K 4.5 4.7 4.9  4.7 4.4 3.6  CL 99 100 95*  96* 103 106  CO2 28 29 28 27 25   GLUCOSE 172* 100* 124*  121* 155* 110*  BUN 18 17 23  23 21 18   CALCIUM 9.9 10.0 9.4 8.7* 8.2*  CREATININE 1.35* 1.52* 1.99*  2.30* 1.42* 1.15  GFRNONAA 57*  --  36* 54* >60    LIVER FUNCTION TESTS: Recent Labs    06/04/23 0812 01/15/24 1413 01/22/24 1307 01/30/24 0924  BILITOT 0.4  0.6 0.3 0.9  AST 16 18 16 26   ALT 14 18 16 18   ALKPHOS 79 69 86 61  PROT 6.8 7.8 7.0 7.8  ALBUMIN 4.3 4.4 4.5 4.2    TUMOR MARKERS: No results for input(s): "AFPTM", "CEA", "CA199", "CHROMGRNA" in the last 8760 hours.  Assessment and Plan: Patient with past medical history of HTN, GERD, DM presents with complaint of recently diagnosed lymphoma.  IR consulted for Port-A-Cath placement at the request of Dr. Ellin Saba. Case reviewed by Dr. Loreta Ave who approves patient for procedure.  Patient presents today in their usual state of health.  He has been NPO and is not currently on blood thinners.   Risks and benefits of image guided port-a-catheter placement was discussed with the patient including, but not limited to bleeding, infection, pneumothorax, or fibrin sheath development and need for additional procedures.  All of the patient's questions were answered, patient is agreeable to proceed. Consent  signed and in chart.   Advance Care Plan: The advanced care plan/surrogate decision maker was discussed at the time of visit and documented in the medical record.     Thank you for this interesting consult.  I greatly enjoyed meeting Shawn Fleming and look forward to participating in their care.  A copy of this report was sent to the requesting provider on this date.  Electronically Signed: Hoyt Koch, PA 02/28/2024, 11:43 AM   I spent a total of  30 Minutes   in face to face in clinical consultation, greater than 50% of which was counseling/coordinating care for lymphoma.

## 2024-02-28 NOTE — Consult Note (Deleted)
 Chief Complaint: Patient was seen in consultation today for diffuse follicle center lymphoma  Referring Physician(s): Katragadda,Sreedhar  Supervising Physician: Gilmer Mor  Patient Status: [facility] - Out-pt  History of Present Illness: Shawn Fleming is a 68 y.o. 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  He now has plans for upcoming chemotherapy and is in need of durable venous access.   Patient presents to [facility]iology for procedure today in his usual state of health.   He has been NPO. His son is available for transportation home and post-procedure care.  He is agreeable to the procedure today.   Past Medical History:  Diagnosis Date   AKI (acute kidney injury) (HCC) 07/29/2020   Arthritis    Diabetes (HCC)    GERD (gastroesophageal reflux disease)    History of kidney stones    Hypercholesteremia    Hypertension    Migraine    Sleep apnea     Past Surgical History:  Procedure Laterality Date   ADENOIDECTOMY     APPENDECTOMY     CENTRAL VENOUS CATHETER INSERTION Left 04/20/2013   Procedure: INSERTION CENTRAL LINE LEFT SUBCLAVIAN;  Surgeon: Fabio Bering, MD;  Location: [facility] ORS;  Service: General;  Laterality: Left;   INCISION AND DRAINAGE PERIRECTAL ABSCESS N/A 04/20/2013   Procedure: SHARP SURGICAL DEBRIDEMENT PERINEAL INFECTION;  Surgeon: Fabio Bering, MD;  Location: [facility];  Service: General;  Laterality: N/A;   KNEE SURGERY Right    arthroscopic   SHOULDER SURGERY Bilateral    removal of bone.   TONSILLECTOMY      Allergies: Prednisone  Medications: Prior to Admission medications   Medication Sig Start Date End Date Taking? Authorizing Provider  ALPRAZolam Prudy Feeler) 0.5 MG tablet Take 0.5 mg by mouth 2 (two) times daily.   Yes [provider]  apixaban (ELIQUIS) 5 MG TABS tablet Take 1 tablet (5 mg total) by mouth 2 (two) times daily. Please start around August 31, 2020 after you complete the initial starter pack 08/31/20  Yes Emokpae, Courage, MD  Cholecalciferol (VITAMIN D3) 5000 units CAPS Take 2 capsules by mouth daily.    Yes [provider]  furosemide (LASIX) 40 MG tablet Take 40 mg by mouth daily. 07/27/22  Yes [provider]  gabapentin (NEURONTIN) 600 MG tablet Take 600 mg by mouth 3 (three) times daily. 01/02/24  Yes [provider]  guaiFENesin-dextromethorphan (ROBITUSSIN DM) 100-10 MG/5ML syrup Take 10 mLs by mouth every 4 (four) hours as needed for cough (chest congestion). 02/01/24  Yes Mdala-Gausi, Gwenette Greet, MD  hydrocortisone (CORTEF) 10 MG tablet TAKE 1 TABLET DAILY AT 8 A.M. AND 1 TABLET DAILY AT NOON 01/17/24  Yes Nida, Denman George, MD  insulin glargine (LANTUS) 100 UNIT/ML Solostar Pen Inject 50 Units into the skin daily.   Yes [provider]  Multiple Vitamins-Minerals (MULTIVITAMIN PO) Take 1 tablet by mouth daily.   Yes [provider]  Omega-3 Fatty Acids (FISH OIL) 1200 MG CAPS Take 1 capsule by mouth daily.   Yes [provider]  Oxycodone HCl 10 MG TABS Take 10 mg by mouth 4 (four) times daily. 07/05/20  Yes [provider]  pantoprazole (PROTONIX) 40 MG tablet Take 40 mg by mouth daily. 03/21/20  Yes [provider]  potassium chloride (KLOR-CON) 10 MEQ tablet Take 10 mEq by mouth daily. 07/11/22  Yes [provider]  rosuvastatin (CRESTOR) 10 MG tablet  Take 10 mg by mouth daily.   Yes [provider]  tirzepatide Greggory Keen) 12.5 MG/0.5ML Pen Inject 12.5 mg into the skin once a week. 07/23/23  Yes   acyclovir (ZOVIRAX) 400 MG tablet Take 1 tablet (400 mg total) by mouth daily. 02/25/24   Doreatha Massed, MD  allopurinol (ZYLOPRIM) 300 MG tablet Take 1 tablet (300 mg total) by mouth daily. 02/25/24   Doreatha Massed, MD  benzonatate (TESSALON) 200 MG capsule Take 1 capsule (200 mg total) by mouth 3 (three) times daily. For cough 02/01/24    Mdala-Gausi, Gwenette Greet, MD  dexamethasone (DECADRON) 4 MG tablet Take 2 tablets (8 mg total) by mouth daily. Start the day after chemotherapy for 2 days. Take with food. 02/25/24   Doreatha Massed, MD  lidocaine-prilocaine (EMLA) cream Apply topically as needed (Place small amount to port site 30 minutes prior to port access.). Apply to affected area once 02/25/24   Doreatha Massed, MD  ondansetron (ZOFRAN) 8 MG tablet Take 1 tablet (8 mg total) by mouth every 8 (eight) hours as needed for nausea or vomiting. Start on the second day after chemotherapy. Then take as needed for nausea or vomiting. 02/25/24   Doreatha Massed, MD  Palonosetron HCl (ALOXI IV) Inject into the vein.    [provider]  prochlorperazine (COMPAZINE) 10 MG tablet Take 1 tablet (10 mg total) by mouth every 6 (six) hours as needed for nausea or vomiting. 02/25/24   Doreatha Massed, MD  sulfamethoxazole-trimethoprim (BACTRIM DS) 800-160 MG tablet Take 1 tablet by mouth 3 (three) times a week. 02/25/24   Doreatha Massed, MD     Family History  Problem Relation Age of Onset   Cancer Mother    Dementia Father    Colon cancer Neg Hx    Rectal cancer Neg Hx    Stomach cancer Neg Hx    Esophageal cancer Neg Hx     Social History   Socioeconomic History   Marital status: Divorced    Spouse name: Not on file   Number of children: 6   Years of education: Not on file   Highest education level: Not on file  Occupational History   Occupation: Engineer, materials  Tobacco Use   Smoking status: Former    Current packs/day: 0.50    Average packs/day: 0.5 packs/day for 23.0 years (11.5 ttl pk-yrs)    Types: Cigarettes   Smokeless tobacco: Never  Vaping Use   Vaping status: Former  Substance and Sexual Activity   Alcohol use: Yes    Comment: rare   Drug use: No   Sexual activity: Not Currently    Birth control/protection: None  Other Topics Concern   Not on file  Social History  Narrative   Not on file   Social Drivers of Health   Financial Resource Strain: Not on file  Food Insecurity: No Food Insecurity (02/05/2024)   Hunger Vital Sign    Worried About Running Out of Food in the Last Year: Never true    Ran Out of Food in the Last Year: Never true  Transportation Needs: No Transportation Needs (02/05/2024)   PRAPARE - Administrator, Civil Service (Medical): No    Lack of Transportation (Non-Medical): No  Physical Activity: Not on file  Stress: Not on file  Social Connections: Patient Declined (01/31/2024)   Social Connection and Isolation Panel [NHANES]    Frequency of Communication with Friends and Family: Patient declined    Frequency of Social  Gatherings with Friends and Family: Patient declined    Attends Religious Services: Patient declined    Database administrator or Organizations: Patient declined    Attends Banker Meetings: Patient declined    Marital Status: Patient declined     Review of Systems: A 12 point ROS discussed and pertinent positives are indicated in the HPI above.  All other systems are negative.  Review of Systems  Constitutional:  Negative for fatigue and fever.  Respiratory:  Negative for cough and shortness of breath.   Cardiovascular:  Negative for chest pain.  Gastrointestinal:  Negative for abdominal pain, nausea and vomiting.  Musculoskeletal:  Negative for back pain.  Psychiatric/Behavioral:  Negative for behavioral problems and confusion.     Vital Signs: BP 119/80   Pulse 86   Temp 97.7 F (36.5 C) (Oral)   Resp 19   Ht 5\' 5"  (1.651 m)   Wt 212 lb (96.2 kg)   SpO2 92%   BMI 35.28 kg/m   Physical Exam Vitals and nursing note reviewed.  Constitutional:      General: He is not in acute distress.    Appearance: Normal appearance. He is not ill-appearing.  HENT:     Mouth/Throat:     Mouth: Mucous membranes are moist.     Pharynx: Oropharynx is clear.  Cardiovascular:     Rate  and Rhythm: Normal rate and regular rhythm.  Pulmonary:     Effort: Pulmonary effort is normal. No respiratory distress.     Breath sounds: Normal breath sounds.  Abdominal:     General: Abdomen is flat. There is no distension.     Palpations: Abdomen is soft.  Skin:    General: Skin is warm and dry.  Neurological:     General: No focal deficit present.     Mental Status: He is oriented to person, place, and time. Mental status is at baseline.  Psychiatric:        Mood and Affect: Mood normal.        Behavior: Behavior normal.        Thought Content: Thought content normal.        Judgment: Judgment normal.      MD Evaluation Airway: WNL Heart: WNL Abdomen: WNL Chest/ Lungs: WNL ASA  Classification: 3 Mallampati/Airway Score: Two   Imaging: [place] CORE BIOPSY (LYMPH NODES) Result Date: 02/07/2024 INDICATION: Hypermetabolic soft tissue within the left masseter space and left submandibular lymph nodes. Patient presents for ultrasound-guided core biopsy of both regions. EXAM: ULTRASOUND BIOPSY OF THE LYMPH NODES; MUSCLE/SOFT TISSUE CORE BIOPSY MEDICATIONS: None. ANESTHESIA/SEDATION: Moderate (conscious) sedation was employed during this procedure. A total of Versed 1.5 mg and Fentanyl 50 mcg was administered intravenously by the Radiology nurse. Moderate Sedation Time: 14 minutes. The patient's level of consciousness and vital signs were monitored continuously by radiology nursing throughout the procedure under my direct supervision. FLUOROSCOPY TIME:  None. COMPLICATIONS: None immediate. PROCEDURE: Informed written consent was obtained from the patient after a thorough discussion of the procedural risks, benefits and alternatives. All questions were addressed. Maximal Sterile Barrier Technique was utilized including caps, mask, sterile gowns, sterile gloves, sterile drape, hand hygiene and skin antiseptic. A timeout was performed prior to the initiation of the procedure. LEFT FACE MASS  Ultrasound was used to interrogate the left face and deep masticator space. There is an ill-defined hypoechoic soft tissue mass in the region. The overlying skin was sterilely prepped and draped in the standard fashion  using chlorhexidine skin prep. Local anesthesia was attained by infiltration with 1% lidocaine. A small dermatotomy was made. Under real-time ultrasound guidance, multiple 18 gauge core biopsies were obtained of the soft tissue mass. Biopsy specimens were placed in ultrasound and delivered to pathology for further analysis. Post biopsy imaging demonstrates no evidence of complication. LEFT SUBMANDIBULAR LYMPHADENOPATHY Ultrasound was next used to investigate the left submandibular space. A large abnormal hypoechoic lymph node is identified. A suitable skin entry site was selected and marked. The overlying skin was sterilely prepped and draped in the standard fashion using chlorhexidine skin prep. Local anesthesia was attained by infiltration with 1% lidocaine. A small dermatotomy was made. Under real-time ultrasound guidance, multiple 18 gauge core biopsies were obtained coaxially. Biopsy specimens were placed in saline and delivered to pathology for further analysis. Post biopsy imaging demonstrates no evidence of complication. The patient tolerated both procedures well. IMPRESSION: 1. Successful ultrasound-guided core biopsy of left face/masseter space lesion. 2. Successful ultrasound-guided core biopsy of left submandibular lymph node. Electronically Signed   By: Malachy Moan M.D.   On: 02/07/2024 11:30   [place] CORE BIOPSY (SOFT TISSUE) Result Date: 02/07/2024 INDICATION: Hypermetabolic soft tissue within the left masseter space and left submandibular lymph nodes. Patient presents for ultrasound-guided core biopsy of both regions. EXAM: ULTRASOUND BIOPSY OF THE LYMPH NODES; MUSCLE/SOFT TISSUE CORE BIOPSY MEDICATIONS: None. ANESTHESIA/SEDATION: Moderate (conscious) sedation was employed during  this procedure. A total of Versed 1.5 mg and Fentanyl 50 mcg was administered intravenously by the Radiology nurse. Moderate Sedation Time: 14 minutes. The patient's level of consciousness and vital signs were monitored continuously by radiology nursing throughout the procedure under my direct supervision. FLUOROSCOPY TIME:  None. COMPLICATIONS: None immediate. PROCEDURE: Informed written consent was obtained from the patient after a thorough discussion of the procedural risks, benefits and alternatives. All questions were addressed. Maximal Sterile Barrier Technique was utilized including caps, mask, sterile gowns, sterile gloves, sterile drape, hand hygiene and skin antiseptic. A timeout was performed prior to the initiation of the procedure. LEFT FACE MASS Ultrasound was used to interrogate the left face and deep masticator space. There is an ill-defined hypoechoic soft tissue mass in the region. The overlying skin was sterilely prepped and draped in the standard fashion using chlorhexidine skin prep. Local anesthesia was attained by infiltration with 1% lidocaine. A small dermatotomy was made. Under real-time ultrasound guidance, multiple 18 gauge core biopsies were obtained of the soft tissue mass. Biopsy specimens were placed in ultrasound and delivered to pathology for further analysis. Post biopsy imaging demonstrates no evidence of complication. LEFT SUBMANDIBULAR LYMPHADENOPATHY Ultrasound was next used to investigate the left submandibular space. A large abnormal hypoechoic lymph node is identified. A suitable skin entry site was selected and marked. The overlying skin was sterilely prepped and draped in the standard fashion using chlorhexidine skin prep. Local anesthesia was attained by infiltration with 1% lidocaine. A small dermatotomy was made. Under real-time ultrasound guidance, multiple 18 gauge core biopsies were obtained coaxially. Biopsy specimens were placed in saline and delivered to pathology  for further analysis. Post biopsy imaging demonstrates no evidence of complication. The patient tolerated both procedures well. IMPRESSION: 1. Successful ultrasound-guided core biopsy of left face/masseter space lesion. 2. Successful ultrasound-guided core biopsy of left submandibular lymph node. Electronically Signed   By: Malachy Moan M.D.   On: 02/07/2024 11:30   EEG adult Result Date: 01/31/2024 Charlsie Quest, MD     01/31/2024  5:13 PM Patient Name: Mellody Dance  JODECI ROARTY MRN: [id number] Epilepsy Attending: Charlsie Quest Referring Physician/Provider: Gloris Manchester, MD Date: 01/31/2024 Duration: 22.11 mins Patient history: 68yo m with transient episode of altered mental status, confusion, had urine incontinence. EEG to evaluate for seizure Level of alertness: Awake AEDs during EEG study: None Technical aspects: This EEG study was done with scalp electrodes positioned according to the 10-20 International system of electrode placement. Electrical activity was reviewed with band pass filter of 1-70Hz , sensitivity of 7 uV/mm, display speed of 32mm/sec with a 60Hz  notched filter applied as appropriate. EEG data were recorded continuously and digitally stored.  Video monitoring was available and reviewed as appropriate. Description: The posterior dominant rhythm consists of 9 Hz activity of moderate voltage (25-35 uV) seen predominantly in posterior head regions, symmetric and reactive to eye opening and eye closing. Hyperventilation and photic stimulation were not performed.   IMPRESSION: This study is within normal limits. No seizures or epileptiform discharges were seen throughout the recording. A normal interictal EEG does not exclude the diagnosis of epilepsy. Charlsie Quest   MR Brain W and Wo Contrast Result Date: 01/30/2024 CLINICAL DATA:  Metastatic disease evaluation. Confusion. Fall. History of hematologic malignancy and lymphadenopathy. EXAM: MRI HEAD WITHOUT AND WITH CONTRAST TECHNIQUE:  Multiplanar, multiecho pulse sequences of the brain and surrounding structures were obtained without and with intravenous contrast. CONTRAST:  10mL GADAVIST GADOBUTROL 1 MMOL/ML IV SOLN COMPARISON:  Head CT same day.  PET scan 01/23/2024 FINDINGS: Brain: Fusion imaging does not show any acute or subacute infarction. No focal abnormality affects the brainstem or cerebellum. Cerebral hemispheres show mild chronic small-vessel ischemic changes of the white matter. No cortical or large vessel territory infarction. No intracranial mass lesion, hemorrhage, hydrocephalus or extra-axial collection. No abnormal brain or leptomeningeal enhancement occurs. Vascular: Major vessels at the base of the brain show flow. Skull and upper cervical spine: No bone abnormality seen. Sinuses/Orbits: Mucosal inflammatory changes of the paranasal sinuses. Orbits negative. Other: Soft tissue masses of the scalp and the masticator space on the left as shown previously. IMPRESSION: 1. No acute or reversible intracranial finding. No evidence of intracranial metastatic disease. Mild chronic small-vessel ischemic changes of the cerebral hemispheric white matter. 2. Soft tissue masses of the scalp and masticator space on the left as shown previously. Electronically Signed   By: Paulina Fusi M.D.   On: 01/30/2024 18:35   CT Angio Chest/Abd/Pel for Dissection W and/or Wo Contrast Result Date: 01/30/2024 CLINICAL DATA:  Larey Seat yesterday, seizure, low back pain, cough, history of hematologic malignancy and head and neck lymphadenopathy EXAM: CT ANGIOGRAPHY CHEST, ABDOMEN AND PELVIS TECHNIQUE: Non-contrast CT of the chest was initially obtained. Multidetector CT imaging through the chest, abdomen and pelvis was performed using the standard protocol during bolus administration of intravenous contrast. Multiplanar reconstructed images and MIPs were obtained and reviewed to evaluate the vascular anatomy. RADIATION DOSE REDUCTION: This exam was performed  according to the departmental dose-optimization program which includes automated exposure control, adjustment of the mA and/or kV according to patient size and/or use of iterative reconstruction technique. CONTRAST:  75mL OMNIPAQUE IOHEXOL 350 MG/ML SOLN COMPARISON:  01/30/2024, 01/23/2024 FINDINGS: CTA CHEST FINDINGS Cardiovascular: No evidence of thoracic aortic aneurysm or dissection. The heart is unremarkable without pericardial effusion. There is adequate opacification of the central and segmental pulmonary arteries, with no filling defects or pulmonary emboli identified. Atherosclerosis of the aorta and coronary vasculature. Mediastinum/Nodes: No enlarged mediastinal, hilar, or axillary lymph nodes. Thyroid gland, trachea, and esophagus demonstrate  no significant findings. Lungs/Pleura: 1.8 x 1.4 cm spiculated left hilar mass encasing the left lower lobe superior segment bronchus, reference image 60/6. This area appears hypermetabolic on recent PET scan, and is highly concerning for malignancy. Bronchoscopy may be useful for further evaluation. No acute airspace disease, effusion, or pneumothorax. Central airways are patent. Musculoskeletal: No acute or destructive bony abnormalities. Reconstructed images demonstrate no additional findings. Review of the MIP images confirms the above findings. CTA ABDOMEN AND PELVIS FINDINGS VASCULAR Aorta: Normal caliber aorta without aneurysm, dissection, vasculitis or significant stenosis. Mild atherosclerosis. Celiac: Patent without evidence of aneurysm, dissection, vasculitis or significant stenosis. SMA: Patent without evidence of aneurysm, dissection, vasculitis or significant stenosis. Renals: Both renal arteries are patent without evidence of aneurysm, dissection, vasculitis, fibromuscular dysplasia or significant stenosis. Mild atherosclerosis within the proximal left renal artery. IMA: Patent without evidence of aneurysm, dissection, vasculitis or significant  stenosis. Inflow: Patent without evidence of aneurysm, dissection, vasculitis or significant stenosis. Veins: No obvious venous abnormality within the limitations of this arterial phase study. Review of the MIP images confirms the above findings. NON-VASCULAR Hepatobiliary: No focal liver abnormality is seen. No gallstones, gallbladder wall thickening, or biliary dilatation. Pancreas: Unremarkable. No pancreatic ductal dilatation or surrounding inflammatory changes. Spleen: Normal in size without focal abnormality. Adrenals/Urinary Tract: Kidneys enhance normally and symmetrically. No urinary tract calculi or obstructive uropathy. The adrenals are unremarkable. There is a 1.6 x 1.4 x 1.2 cm hyperdense mass along the posterior aspect of the bladder just superior to the right UVJ, reference image 164/4. Cystoscopy is recommended for further evaluation. Stomach/Bowel: No bowel obstruction or ileus. The appendix is surgically absent. Diverticulosis of the descending and sigmoid colon without evidence of diverticulitis. No bowel wall thickening or inflammatory change. Lymphatic: Stable borderline enlarged lymph nodes within the bilateral inguinal regions. No other pathologic adenopathy. Reproductive: Prostate is unremarkable. Other: No free fluid or free intraperitoneal gas. Stable fat containing left inguinal hernia. No bowel herniation. Musculoskeletal: No acute or destructive bony abnormalities. Reconstructed images demonstrate no additional findings. Review of the MIP images confirms the above findings. IMPRESSION: Vascular: 1. No evidence of thoracoabdominal aortic aneurysm or dissection. 2. No evidence of pulmonary embolus. 3.  Aortic Atherosclerosis (ICD10-I70.0). Nonvascular: 1. 1.8 x 1.4 cm spiculated left hilar mass, encasing the left lower lobe superior segmental bronchus at its origin, highly concerning for malignancy. This area appears hypermetabolic on recent PET scan. Bronchoscopy may be useful further  evaluation. 2. 1.6 cm hyperdense mass along the posterior wall the bladder. Cystoscopy recommended for further evaluation. 3. Stable borderline enlarged bilateral inguinal lymph nodes, nonspecific. 4. Distal colonic diverticulosis without diverticulitis. Electronically Signed   By: Sharlet Salina M.D.   On: 01/30/2024 15:42   CT Head Wo Contrast Result Date: 01/30/2024 CLINICAL DATA:  Larey Seat yesterday, seizure, history of hematologic malignancy and lymphadenopathy EXAM: CT HEAD WITHOUT CONTRAST TECHNIQUE: Contiguous axial images were obtained from the base of the skull through the vertex without intravenous contrast. RADIATION DOSE REDUCTION: This exam was performed according to the departmental dose-optimization program which includes automated exposure control, adjustment of the mA and/or kV according to patient size and/or use of iterative reconstruction technique. COMPARISON:  12/28/2023, 01/23/2024 FINDINGS: Brain: No acute infarct or hemorrhage. Lateral ventricles and midline structures are unremarkable. No acute extra-axial fluid collections. No mass effect. Vascular: No hyperdense vessel or unexpected calcification. Skull: Soft tissue nodularity along the outer table of the left parietal region of the calvarium is again noted, measuring up to 7  mm in thickness reference image 44/3, corresponding to hypermetabolic deposits on recent PET scan. Negative for fracture or focal lesion. Sinuses/Orbits: Enlargement of the left muscles of mastication is noted, measuring 4.1 x 2.2 cm reference image 11/3, previously measuring 3.8 x 1.8 cm. This was noted to be hypermetabolic on recent PET scan 01/23/2024. Stable left periauricular adenopathy, measuring up to 10 mm in short axis reference image 3/3, also hypermetabolic on recent PET scan. Paranasal sinuses are unremarkable. Other: None. IMPRESSION: 1. No acute infarct or hemorrhage. 2. Stable enlargement of the left muscles of mastication, subcutaneous soft tissue  nodularity along the left parietal region of the calvarium, and left periauricular adenopathy, consistent with underlying malignancy as noted on recent PET scan. Electronically Signed   By: Sharlet Salina M.D.   On: 01/30/2024 15:28   DG Chest Portable 1 View Result Date: 01/30/2024 CLINICAL DATA:  Cough EXAM: PORTABLE CHEST 1 VIEW COMPARISON:  02/13/2019 FINDINGS: The heart size and mediastinal contours are within normal limits. Both lungs are clear. The visualized skeletal structures are unremarkable. IMPRESSION: No active disease. Electronically Signed   By: Duanne Guess D.O.   On: 01/30/2024 11:04   CT Lumbar Spine Wo Contrast Result Date: 01/30/2024 CLINICAL DATA:  Low back pain with cauda equina syndrome suspected EXAM: CT LUMBAR SPINE WITHOUT CONTRAST TECHNIQUE: Multidetector CT imaging of the lumbar spine was performed without intravenous contrast administration. Multiplanar CT image reconstructions were also generated. RADIATION DOSE REDUCTION: This exam was performed according to the departmental dose-optimization program which includes automated exposure control, adjustment of the mA and/or kV according to patient size and/or use of iterative reconstruction technique. COMPARISON:  Lumbar MRI 01/11/2024 FINDINGS: Segmentation: 5 lumbar type vertebrae. Alignment: Normal. Vertebrae: No acute fracture or focal pathologic process. Paraspinal and other soft tissues: Negative. Disc levels: T12- L1: Spondylitic spurring.  No neural impingement. L1-L2: Disc space narrowing with ventral spondylitic spurring. No neural impingement. L2-L3: Disc space narrowing with mild endplate and facet spurring. No neural compression. L3-L4: Disc space narrowing with endplate and facet spurring. Mild narrowing of the thecal sac. L4-L5: Disc narrowing with endplate and moderate facet spurring. Mild narrowing of the thecal sac and foramina. L5-S1:Disc space narrowing with endplate ridging. Degenerative facet spurring which  is asymmetrically bulky on the right. IMPRESSION: 1. No acute finding. 2. 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 Electronically Signed   By: Tiburcio Pea M.D.   On: 01/30/2024 11:00    Labs:  CBC: Recent Labs    01/15/24 1413 01/30/24 0924 01/31/24 0254  WBC 10.7* 10.0 5.4  HGB 14.8 13.5  13.6 12.2*  HCT 44.3 39.4  40.0 36.2*  PLT 261 196 152    COAGS: No results for input(s): "INR", "APTT" in the last 8760 hours.  BMP: Recent Labs    01/15/24 1413 01/22/24 1307 01/30/24 0924 01/31/24 0254 02/01/24 0459  NA 137 143 134*  136 138 138  K 4.5 4.7 4.9  4.7 4.4 3.6  CL 99 100 95*  96* 103 106  CO2 28 29 28 27 25   GLUCOSE 172* 100* 124*  121* 155* 110*  BUN 18 17 23  23 21 18   CALCIUM 9.9 10.0 9.4 8.7* 8.2*  CREATININE 1.35* 1.52* 1.99*  2.30* 1.42* 1.15  GFRNONAA 57*  --  36* 54* >60    LIVER FUNCTION TESTS: Recent Labs    06/04/23 0812 01/15/24 1413 01/22/24 1307 01/30/24 0924  BILITOT 0.4  0.6 0.3 0.9  AST 16 18 16 26   ALT 14 18 16 18   ALKPHOS 79 69 86 61  PROT 6.8 7.8 7.0 7.8  ALBUMIN 4.3 4.4 4.5 4.2    TUMOR MARKERS: No results for input(s): "AFPTM", "CEA", "CA199", "CHROMGRNA" in the last 8760 hours.  Assessment and Plan: Patient with past medical history of HTN, GERD, DM presents with complaint of recently diagnosed lymphoma.  IR consulted for Port-A-Cath placement at the request of Dr. Ellin Saba. Case reviewed by Dr. Loreta Ave who approves patient for procedure.  Patient presents today in their usual state of health.  He has been NPO and is not currently on blood thinners.   Risks and benefits of image guided port-a-catheter placement was discussed with the patient including, but not limited to bleeding, infection, pneumothorax, or fibrin sheath development and need for additional procedures.  All of the patient's questions were answered, patient is agreeable to proceed. Consent  signed and in chart.   Advance Care Plan: The advanced care plan/surrogate decision maker was discussed at the time of visit and documented in the medical record.     Thank you for this interesting consult.  I greatly enjoyed meeting Shawn Fleming and look forward to participating in their care.  A copy of this report was sent to the requesting provider on this date.  Electronically Signed: Hoyt Koch, PA 02/28/2024, 11:43 AM   I spent a total of  30 Minutes   in face to face in clinical consultation, greater than 50% of which was counseling/coordinating care for lymphoma.

## 2024-02-28 NOTE — Procedures (Signed)
 Interventional Radiology Procedure Note  Procedure: Placement of a right IJ approach single lumen PowerPort.  Tip is positioned at the superior cavoatrial junction and catheter is ready for immediate use.  Complications: None Recommendations:  - Ok to shower tomorrow - Do not submerge for 7 days - Routine line care   Signed,  Yvone Neu. Loreta Ave, DO, ABVM, RPVI

## 2024-03-03 ENCOUNTER — Other Ambulatory Visit

## 2024-03-03 ENCOUNTER — Inpatient Hospital Stay: Admitting: Dietician

## 2024-03-03 ENCOUNTER — Inpatient Hospital Stay

## 2024-03-03 ENCOUNTER — Ambulatory Visit

## 2024-03-03 VITALS — BP 91/63 | HR 98 | Temp 97.0°F | Resp 18 | Wt 222.8 lb

## 2024-03-03 DIAGNOSIS — C8259 Diffuse follicle center lymphoma, extranodal and solid organ sites: Secondary | ICD-10-CM

## 2024-03-03 DIAGNOSIS — Z5112 Encounter for antineoplastic immunotherapy: Secondary | ICD-10-CM | POA: Diagnosis not present

## 2024-03-03 LAB — MAGNESIUM: Magnesium: 2.1 mg/dL (ref 1.7–2.4)

## 2024-03-03 LAB — COMPREHENSIVE METABOLIC PANEL
ALT: 13 U/L (ref 0–44)
AST: 16 U/L (ref 15–41)
Albumin: 3.8 g/dL (ref 3.5–5.0)
Alkaline Phosphatase: 57 U/L (ref 38–126)
Anion gap: 11 (ref 5–15)
BUN: 18 mg/dL (ref 8–23)
CO2: 27 mmol/L (ref 22–32)
Calcium: 9.4 mg/dL (ref 8.9–10.3)
Chloride: 102 mmol/L (ref 98–111)
Creatinine, Ser: 1.47 mg/dL — ABNORMAL HIGH (ref 0.61–1.24)
GFR, Estimated: 52 mL/min — ABNORMAL LOW (ref >60)
Glucose, Bld: 206 mg/dL — ABNORMAL HIGH (ref 70–99)
Potassium: 3.9 mmol/L (ref 3.5–5.1)
Sodium: 140 mmol/L (ref 135–145)
Total Bilirubin: 0.6 mg/dL (ref 0.0–1.2)
Total Protein: 6.8 g/dL (ref 6.5–8.1)

## 2024-03-03 LAB — CBC WITH DIFFERENTIAL/PLATELET
Abs Immature Granulocytes: 0.03 10*3/uL (ref 0.00–0.07)
Basophils Absolute: 0.1 10*3/uL (ref 0.0–0.1)
Basophils Relative: 1 %
Eosinophils Absolute: 0.6 10*3/uL — ABNORMAL HIGH (ref 0.0–0.5)
Eosinophils Relative: 6 %
HCT: 39.9 % (ref 39.0–52.0)
Hemoglobin: 13.3 g/dL (ref 13.0–17.0)
Immature Granulocytes: 0 %
Lymphocytes Relative: 24 %
Lymphs Abs: 2.3 10*3/uL (ref 0.7–4.0)
MCH: 30.2 pg (ref 26.0–34.0)
MCHC: 33.3 g/dL (ref 30.0–36.0)
MCV: 90.7 fL (ref 80.0–100.0)
Monocytes Absolute: 0.8 10*3/uL (ref 0.1–1.0)
Monocytes Relative: 8 %
Neutro Abs: 5.8 10*3/uL (ref 1.7–7.7)
Neutrophils Relative %: 61 %
Platelets: 199 10*3/uL (ref 150–400)
RBC: 4.4 MIL/uL (ref 4.22–5.81)
RDW: 14.5 % (ref 11.5–15.5)
WBC: 9.5 10*3/uL (ref 4.0–10.5)
nRBC: 0 % (ref 0.0–0.2)

## 2024-03-03 LAB — URIC ACID: Uric Acid, Serum: 7.5 mg/dL (ref 3.7–8.6)

## 2024-03-03 MED ORDER — ACETAMINOPHEN 325 MG PO TABS
650.0000 mg | ORAL_TABLET | Freq: Once | ORAL | Status: AC
  Administered 2024-03-03: 650 mg via ORAL
  Filled 2024-03-03: qty 2

## 2024-03-03 MED ORDER — SODIUM CHLORIDE 0.9 % IV SOLN
20.0000 mg | Freq: Once | INTRAVENOUS | Status: AC
  Administered 2024-03-03: 20 mg via INTRAVENOUS
  Filled 2024-03-03: qty 2

## 2024-03-03 MED ORDER — SODIUM CHLORIDE 0.9 % IV SOLN
70.0000 mg/m2 | Freq: Once | INTRAVENOUS | Status: AC
  Administered 2024-03-03: 150 mg via INTRAVENOUS
  Filled 2024-03-03: qty 6

## 2024-03-03 MED ORDER — FAMOTIDINE IN NACL 20-0.9 MG/50ML-% IV SOLN
20.0000 mg | Freq: Once | INTRAVENOUS | Status: AC
  Administered 2024-03-03: 20 mg via INTRAVENOUS
  Filled 2024-03-03: qty 50

## 2024-03-03 MED ORDER — HEPARIN SOD (PORK) LOCK FLUSH 100 UNIT/ML IV SOLN
500.0000 [IU] | Freq: Once | INTRAVENOUS | Status: AC | PRN
  Administered 2024-03-03: 500 [IU]

## 2024-03-03 MED ORDER — SODIUM CHLORIDE 0.9 % IV SOLN
1000.0000 mg | Freq: Once | INTRAVENOUS | Status: AC
  Administered 2024-03-03: 1000 mg via INTRAVENOUS
  Filled 2024-03-03: qty 40

## 2024-03-03 MED ORDER — SODIUM CHLORIDE 0.9% FLUSH
10.0000 mL | INTRAVENOUS | Status: DC | PRN
  Administered 2024-03-03: 10 mL

## 2024-03-03 MED ORDER — LORAZEPAM 2 MG/ML IJ SOLN
0.5000 mg | Freq: Once | INTRAMUSCULAR | Status: AC
  Administered 2024-03-03: 0.5 mg via INTRAVENOUS
  Filled 2024-03-03: qty 1

## 2024-03-03 MED ORDER — INSULIN ASPART 100 UNIT/ML IJ SOLN
10.0000 [IU] | Freq: Once | INTRAMUSCULAR | Status: AC
  Administered 2024-03-03: 10 [IU] via SUBCUTANEOUS

## 2024-03-03 MED ORDER — SODIUM CHLORIDE 0.9 % IV SOLN
INTRAVENOUS | Status: DC
Start: 2024-03-03 — End: 2024-03-03

## 2024-03-03 MED ORDER — PALONOSETRON HCL INJECTION 0.25 MG/5ML
0.2500 mg | Freq: Once | INTRAVENOUS | Status: AC
  Administered 2024-03-03: 0.25 mg via INTRAVENOUS
  Filled 2024-03-03: qty 5

## 2024-03-03 MED ORDER — CETIRIZINE HCL 10 MG/ML IV SOLN
10.0000 mg | Freq: Once | INTRAVENOUS | Status: AC
  Administered 2024-03-03: 10 mg via INTRAVENOUS
  Filled 2024-03-03: qty 1

## 2024-03-03 NOTE — Progress Notes (Signed)
 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 He is receiving induction chemoimmunotherapy with bendamustine + obintuzumab q28d. Patient is under the care of Dr. Ellin Saba.  Past medical history includes HTN, right DVT, GERD, DM2, adrenal insufficiency, acute encephalopathy, neuropathy, CKD3, bilateral leg edema, HLD, anxiety, spinal stenosis  Met with patient in infusion. Patient is feeling anxious at visit as this is his first chemo. He reports appetite is at baseline. Usually eats 2 meals/day. Patient does not cook. He eats heat/serve meals. Patient drinks a mocha iced coffee in the morning. Sometimes eats breakfast (sausage/gravy bowl). If he does not eat breakfast, pt will have lunch. He likes chicken flavored cup of soup. Pt ate meatloaf, mashed potatoes, corn for dinner last night. He drinks glass of orange juice with supper. He keeps raisons at bedside in case he wakes up wanting a snack. Patient reports drinking quite a bit of water (2-3 bottles). He endorses intentional 50-60 lb weight loss in the last 2 years with mounjaro. Pt is hoping to get under 200 lbs.   Nutrition Focused Physical Exam: deferred   Medications: acyclovir, zyloprim, eliquis, tessalon, D3, decadron, lasix 40 mg, gabapentin, cortef, lantus, MVI, zofran, fish oil, oxycodone, klor-con, compazine, crestor, mounjaro 12.5 mg   Labs: glucose 206, Cr 1.47   Anthropometrics:   Height: 5'5" Weight: 222 lb 12.8 oz  UBW: 225-230 lb (s/p intentional wt loss on mounjaro) BMI: 37.08   NUTRITION DIAGNOSIS: Food and nutrition related knowledge deficit related to newly diagnosed cancer as evidenced by no prior need for associated nutrition information    INTERVENTION:  Educated on importance of adequate calorie and protein energy intake to prevent loss of LBM/maintain strength during treatment Encourage high protein snacks in between meals - handout with  ideas provided Discussed importance of hydration, recommend increasing to 4 bottles/day Contact information provided   MONITORING, EVALUATION, GOAL: Pt will tolerate adequate calories and protein to minimize wt loss during treatment   Next Visit: Monday April 14 during infusion

## 2024-03-03 NOTE — Progress Notes (Signed)
 Orders received to add Lorazepam 0.5 mg IV push x 1 as premedication.  V.O. Dr Carilyn Goodpasture, PharmD

## 2024-03-03 NOTE — Patient Instructions (Signed)
 [facility]  - A DEPT OF [facility]  Discharge Instructions: Thank you for choosing [facility] to provide your oncology and hematology care.  If you have a lab appointment with the Cancer Center - please note that after April 8th, 2024, all labs will be drawn in the cancer center.  You do not have to check in or register with the main entrance as you have in the past but will complete your check-in in the cancer center.  Wear comfortable clothing and clothing appropriate for easy access to any Portacath or PICC line.   We strive to give you quality time with your provider. You may need to reschedule your appointment if you arrive late (15 or more minutes).  Arriving late affects you and other patients whose appointments are after yours.  Also, if you miss three or more appointments without notifying the office, you may be dismissed from the clinic at the provider's discretion.      For prescription refill requests, have your pharmacy contact our office and allow 72 hours for refills to be completed.    Today you received the following chemotherapy and/or immunotherapy agents Gazyva and Bendamustine. Bendamustine Injection What is this medication? BENDAMUSTINE (BEN da MUS teen) treats leukemia and lymphoma. It works by slowing down the growth of cancer cells. This medicine may be used for other purposes; ask your health care provider or pharmacist if you have questions. COMMON BRAND NAME(S): Marilynne Halsted, VIVIMUSTA What should I tell my care team before I take this medication? They need to know if you have any of these conditions: Infection, especially a viral infection, such as chickenpox, cold sores, herpes Kidney disease Liver disease An unusual or allergic reaction to bendamustine, mannitol, other medications, foods, dyes, or preservatives Pregnant or trying to get pregnant Breast-feeding How should I use this medication? This  medication is injected into a vein. It is given by your care team in a hospital or clinic setting. Talk to your care team about the use of this medication in children. Special care may be needed. Overdosage: If you think you have taken too much of this medicine contact a poison control center or emergency room at once. NOTE: This medicine is only for you. Do not share this medicine with others. What if I miss a dose? Keep appointments for follow-up doses. It is important not to miss your dose. Call your care team if you are unable to keep an appointment. What may interact with this medication? Do not take this medication with any of the following: Clozapine This medication may also interact with the following: Atazanavir Cimetidine Ciprofloxacin Enoxacin Fluvoxamine Medications for seizures, such as carbamazepine, phenobarbital Mexiletine Rifampin Tacrine Thiabendazole Zileuton This list may not describe all possible interactions. Give your health care provider a list of all the medicines, herbs, non-prescription drugs, or dietary supplements you use. Also tell them if you smoke, drink alcohol, or use illegal drugs. Some items may interact with your medicine. What should I watch for while using this medication? Visit your care team for regular checks on your progress. This medication may make you feel generally unwell. This is not uncommon, as chemotherapy can affect healthy cells as well as cancer cells. Report any side effects. Continue your course of treatment even though you feel ill unless your care team tells you to stop. You may need blood work while taking this medication. This medication may increase your risk of getting an  infection. Call your care team for advice if you get a fever, chills, sore throat, or other symptoms of a cold or flu. Do not treat yourself. Try to avoid being around people who are sick. This medication may cause serious skin reactions. They can happen weeks  to months after starting the medication. Contact your care team right away if you notice fevers or flu-like symptoms with a rash. The rash may be red or purple and then turn into blisters or peeling of the skin. You may also notice a red rash with swelling of the face, lips, or lymph nodes in your neck or under your arms. In some patients, this medication may cause a serious brain infection that may cause death. If you have any problems seeing, thinking, speaking, walking, or standing, tell your care team right away. If you cannot reach your care team, urgently seek other source of medical care. This medication may increase your risk to bruise or bleed. Call your care team if you notice any unusual bleeding. Talk to your care team about your risk of cancer. You may be more at risk for certain types of cancer if you take this medication. Talk to your care team about your risk of skin cancer. You may be more at risk for skin cancer if you take this medication. Talk to your care team if you or your partner wish to become pregnant or think either of you might be pregnant. This medication can cause serious birth defects if taken during pregnancy or for up to 6 months after the last dose. A negative pregnancy test is required before starting this medication. A reliable form of contraception is recommended while taking this medication and for 6 months after the last dose. Talk to your care team about reliable forms of contraception. Wear a condom while taking this medication and for at least 3 months after the last dose. Do not breast-feed while taking this medication or for at least 1 week after the last dose. This medication may cause infertility. Talk to your care team if you are concerned about your fertility. What side effects may I notice from receiving this medication? Side effects that you should report to your care team as soon as possible: Allergic reactions--skin rash, itching, hives, swelling of the  face, lips, tongue, or throat Infection--fever, chills, cough, sore throat, wounds that don't heal, pain or trouble when passing urine, general feeling of discomfort or being unwell Infusion reactions--chest pain, shortness of breath or trouble breathing, feeling faint or lightheaded Liver injury--right upper belly pain, loss of appetite, nausea, light-colored stool, dark yellow or brown urine, yellowing skin or eyes, unusual weakness or fatigue Low red blood cell level--unusual weakness or fatigue, dizziness, headache, trouble breathing Painful swelling, warmth, or redness of the skin, blisters or sores at the infusion site Rash, fever, and swollen lymph nodes Redness, blistering, peeling, or loosening of the skin, including inside the mouth Tumor lysis syndrome (TLS)--nausea, vomiting, diarrhea, decrease in the amount of urine, dark urine, unusual weakness or fatigue, confusion, muscle pain or cramps, fast or irregular heartbeat, joint pain Unusual bruising or bleeding Side effects that usually do not require medical attention (report to your care team if they continue or are bothersome): Diarrhea Fatigue Headache Loss of appetite Nausea Vomiting This list may not describe all possible side effects. Call your doctor for medical advice about side effects. You may report side effects to FDA at 1-800-FDA-1088. Where should I keep my medication? This medication is given  in a hospital or clinic. It will not be stored at home. NOTE: This sheet is a summary. It may not cover all possible information. If you have questions about this medicine, talk to your doctor, pharmacist, or health care provider.  2024 Elsevier/Gold Standard (2022-03-28 00:00:00)Obinutuzumab Injection What is this medication? OBINUTUZUMAB (OH bi nue TOOZ ue mab) treats leukemia and lymphoma. It works by blocking a protein that causes cancer cells to grow and multiply. This helps to slow or stop the spread of cancer cells. It is  a monoclonal antibody. This medicine may be used for other purposes; ask your health care provider or pharmacist if you have questions. COMMON BRAND NAME(S): GAZYVA What should I tell my care team before I take this medication? They need to know if you have any of these conditions: Heart disease Infection, especially a viral infection, such as hepatitis B Lung or breathing disease Take medications that treat or prevent blood clots An unusual or allergic reaction to obinutuzumab, other medications, foods, dyes, or preservatives Pregnant or trying to get pregnant Breastfeeding How should I use this medication? This medication is for infusion into a vein. It is given by a care team in a hospital or clinic setting. Talk to your care team about the use of this medication in children. Special care may be needed. Overdosage: If you think you have taken too much of this medicine contact a poison control center or emergency room at once. NOTE: This medicine is only for you. Do not share this medicine with others. What if I miss a dose? Keep appointments for follow-up doses as directed. It is important not to miss your dose. Call your care team if you are unable to keep an appointment. What may interact with this medication? Live virus vaccines This list may not describe all possible interactions. Give your health care provider a list of all the medicines, herbs, non-prescription drugs, or dietary supplements you use. Also tell them if you smoke, drink alcohol, or use illegal drugs. Some items may interact with your medicine. What should I watch for while using this medication? Report any side effects that you notice during your treatment right away, such as changes in your breathing, fever, chills, dizziness or lightheadedness. These effects are more common with the first dose. Visit your care team for checks on your progress. You will need to have regular blood work. Report any other side effects.  The side effects of this medication can continue after you finish your treatment. Continue your course of treatment even though you feel ill unless your care team tells you to stop. Call your care team for advice if you get a fever, chills or sore throat, or other symptoms of a cold or flu. Do not treat yourself. This medication decreases your body's ability to fight infections. Try to avoid being around people who are sick. This medication may increase your risk to bruise or bleed. Call your care team if you notice any unusual bleeding. Do not become pregnant while taking this medication or for 6 months after stopping it. Inform your care team if you wish to become pregnant or think you might be pregnant. There is a potential for serious side effects to an unborn child. Talk to your care team or pharmacist for more information. Do not breast-feed an infant while taking this medication or for 6 months after stopping it. What side effects may I notice from receiving this medication? Side effects that you should report to your care  team as soon as possible: Allergic reactions--skin rash, itching, hives, swelling of the face, lips, tongue, or throat Bleeding--bloody or black, tar-like stools, vomiting blood or brown material that looks like coffee grounds, red or dark brown urine, small red or purple spots on skin, unusual bruising or bleeding Blood clot--pain, swelling, or warmth in the leg, shortness of breath, chest pain Dizziness, loss of balance or coordination, confusion or trouble speaking Infection--fever, chills, cough, sore throat, wounds that don't heal, pain or trouble when passing urine, general feeling of discomfort or being unwell Infusion reactions--chest pain, shortness of breath or trouble breathing, feeling faint or lightheaded Liver injury--right upper belly pain, loss of appetite, nausea, light-colored stool, dark yellow or brown urine, yellowing skin or eyes, unusual weakness or  fatigue Tumor lysis syndrome (TLS)--nausea, vomiting, diarrhea, decrease in the amount of urine, dark urine, unusual weakness or fatigue, confusion, muscle pain or cramps, fast or irregular heartbeat, joint pain Side effects that usually do not require medical attention (report to your care team if they continue or are bothersome): Bone, joint, or muscle pain Constipation Diarrhea Fatigue Runny or stuffy nose Sore throat This list may not describe all possible side effects. Call your doctor for medical advice about side effects. You may report side effects to FDA at 1-800-FDA-1088. Where should I keep my medication? This medication is only given in a hospital or clinic and will not be stored at home. NOTE: This sheet is a summary. It may not cover all possible information. If you have questions about this medicine, talk to your doctor, pharmacist, or health care provider.  2024 Elsevier/Gold Standard (2022-04-26 00:00:00)      To help prevent nausea and vomiting after your treatment, we encourage you to take your nausea medication as directed.  BELOW ARE SYMPTOMS THAT SHOULD BE REPORTED IMMEDIATELY: *FEVER GREATER THAN 100.4 F (38 C) OR HIGHER *CHILLS OR SWEATING *NAUSEA AND VOMITING THAT IS NOT CONTROLLED WITH YOUR NAUSEA MEDICATION *UNUSUAL SHORTNESS OF BREATH *UNUSUAL BRUISING OR BLEEDING *URINARY PROBLEMS (pain or burning when urinating, or frequent urination) *BOWEL PROBLEMS (unusual diarrhea, constipation, pain near the anus) TENDERNESS IN MOUTH AND THROAT WITH OR WITHOUT PRESENCE OF ULCERS (sore throat, sores in mouth, or a toothache) UNUSUAL RASH, SWELLING OR PAIN  UNUSUAL VAGINAL DISCHARGE OR ITCHING   Items with * indicate a potential emergency and should be followed up as soon as possible or go to the Emergency Department if any problems should occur.  Please show the CHEMOTHERAPY ALERT CARD or IMMUNOTHERAPY ALERT CARD at check-in to the Emergency Department and triage  nurse.  Should you have questions after your visit or need to cancel or reschedule your appointment, please contact [facility] [place] - A DEPT OF [facility] [phone]  and follow the prompts.  Office hours are 8:00 a.m. to 4:30 p.m. Monday - Friday. Please note that voicemails left after 4:00 p.m. may not be returned until the following business day.  We are closed weekends and major holidays. You have access to a nurse at all times for urgent questions. Please call the main number to the clinic [phone] and follow the prompts.  For any non-urgent questions, you may also contact your provider using MyChart. We now offer e-Visits for anyone 5 and older to request care online for non-urgent symptoms. For details visit [email or web address].   Also download the MyChart app! Go to the app store, search "MyChart", open the app, select [place], and log in with  your MyChart username and password.

## 2024-03-03 NOTE — Progress Notes (Signed)
 Received orders to add to premedication:  Lorazepam 0.5 mg IV x 1   Orders updated.  V.O. Dr Carilyn Goodpasture, PharmD

## 2024-03-03 NOTE — Progress Notes (Signed)
 Patient presents today for D1C1 Gazyva infusion. Heart rate 102 on arrival. Rechecl 99. Patient denies any changes since his last visit. Decadron administration directions printed off the [facility] and given to patient. Patient teaching performed and understanding verbalized.   Medication list printed off and given to patient. Directions of all new medication explained to patient. Understanding verbalized.   Glucose 206. Standing orders followed.   11:30 am. Patient sitting in chair, watching movies on his laptop. No complaints noted at this time. Vital signs stable.   Treatment given today per MD orders. Tolerated infusion without adverse affects. Vital signs stable. No complaints at this time. Discharged from clinic ambulatory in stable condition. Alert and oriented x 3. F/U with [facility] as scheduled.

## 2024-03-04 ENCOUNTER — Ambulatory Visit

## 2024-03-04 ENCOUNTER — Inpatient Hospital Stay

## 2024-03-04 VITALS — BP 123/70 | HR 96 | Temp 97.1°F | Resp 20

## 2024-03-04 DIAGNOSIS — C8259 Diffuse follicle center lymphoma, extranodal and solid organ sites: Secondary | ICD-10-CM

## 2024-03-04 DIAGNOSIS — Z5112 Encounter for antineoplastic immunotherapy: Secondary | ICD-10-CM | POA: Diagnosis not present

## 2024-03-04 MED ORDER — HEPARIN SOD (PORK) LOCK FLUSH 100 UNIT/ML IV SOLN
500.0000 [IU] | Freq: Once | INTRAVENOUS | Status: AC | PRN
  Administered 2024-03-04: 500 [IU]

## 2024-03-04 MED ORDER — SODIUM CHLORIDE 0.9% FLUSH
10.0000 mL | INTRAVENOUS | Status: DC | PRN
  Administered 2024-03-04: 10 mL

## 2024-03-04 MED ORDER — DEXAMETHASONE SODIUM PHOSPHATE 10 MG/ML IJ SOLN
10.0000 mg | Freq: Once | INTRAMUSCULAR | Status: AC
  Administered 2024-03-04: 10 mg via INTRAVENOUS
  Filled 2024-03-04: qty 1

## 2024-03-04 MED ORDER — SODIUM CHLORIDE 0.9 % IV SOLN
70.0000 mg/m2 | Freq: Once | INTRAVENOUS | Status: AC
  Administered 2024-03-04: 150 mg via INTRAVENOUS
  Filled 2024-03-04: qty 6

## 2024-03-04 MED ORDER — SODIUM CHLORIDE 0.9 % IV SOLN: INTRAVENOUS | Status: DC

## 2024-03-04 NOTE — Patient Instructions (Signed)
 [facility] [place] - A DEPT OF [facility]  Discharge Instructions: Thank you for choosing [facility] to provide your oncology and hematology care.  If you have a lab appointment with the Cancer Center - please note that after April 8th, 2024, all labs will be drawn in the cancer center.  You do not have to check in or register with the main entrance as you have in the past but will complete your check-in in the cancer center.  Wear comfortable clothing and clothing appropriate for easy access to any Portacath or PICC line.   We strive to give you quality time with your provider. You may need to reschedule your appointment if you arrive late (15 or more minutes).  Arriving late affects you and other patients whose appointments are after yours.  Also, if you miss three or more appointments without notifying the office, you may be dismissed from the clinic at the provider's discretion.      For prescription refill requests, have your pharmacy contact our office and allow 72 hours for refills to be completed.    Today you received the following chemotherapy and/or immunotherapy agents Bendamustine return as scheduled.   To help prevent nausea and vomiting after your treatment, we encourage you to take your nausea medication as directed.  BELOW ARE SYMPTOMS THAT SHOULD BE REPORTED IMMEDIATELY: *FEVER GREATER THAN 100.4 F (38 C) OR HIGHER *CHILLS OR SWEATING *NAUSEA AND VOMITING THAT IS NOT CONTROLLED WITH YOUR NAUSEA MEDICATION *UNUSUAL SHORTNESS OF BREATH *UNUSUAL BRUISING OR BLEEDING *URINARY PROBLEMS (pain or burning when urinating, or frequent urination) *BOWEL PROBLEMS (unusual diarrhea, constipation, pain near the anus) TENDERNESS IN MOUTH AND THROAT WITH OR WITHOUT PRESENCE OF ULCERS (sore throat, sores in mouth, or a toothache) UNUSUAL RASH, SWELLING OR PAIN  UNUSUAL VAGINAL DISCHARGE OR ITCHING   Items with * indicate a potential emergency and  should be followed up as soon as possible or go to the Emergency Department if any problems should occur.  Please show the CHEMOTHERAPY ALERT CARD or IMMUNOTHERAPY ALERT CARD at check-in to the Emergency Department and triage nurse.  Should you have questions after your visit or need to cancel or reschedule your appointment, please contact [facility] [place] - A DEPT OF Eligha Bridegroom [facility] [phone]  and follow the prompts.  Office hours are 8:00 a.m. to 4:30 p.m. Monday - Friday. Please note that voicemails left after 4:00 p.m. may not be returned until the following business day.  We are closed weekends and major holidays. You have access to a nurse at all times for urgent questions. Please call the main number to the clinic [phone] and follow the prompts.  For any non-urgent questions, you may also contact your provider using MyChart. We now offer e-Visits for anyone 98 and older to request care online for non-urgent symptoms. For details visit [email or web address].   Also download the MyChart app! Go to the app store, search "MyChart", open the app, select [place], and log in with your MyChart username and password.

## 2024-03-04 NOTE — Progress Notes (Signed)
 Patient presents today for [place] HR 108 Dr. Ellin Saba made aware, okay for treatment. Medication list that was given to patient yesterday was reviewed again with patient. Patient tolerated chemotherapy with no complaints voiced. Side effects with management reviewed understanding verbalized. Port site clean and dry with bruising from port insertion or swelling noted at site. When bandage removed blood present on gauze, a new gauze dressing applied. Good blood return noted before and after administration of chemotherapy. Patient left in satisfactory condition with VSS and no s/s of distress noted.

## 2024-03-05 ENCOUNTER — Inpatient Hospital Stay: Admitting: Licensed Clinical Social Worker

## 2024-03-05 DIAGNOSIS — C8259 Diffuse follicle center lymphoma, extranodal and solid organ sites: Secondary | ICD-10-CM

## 2024-03-05 NOTE — Progress Notes (Signed)
 [facility] Clinical Social Work  Initial Assessment   Shawn Fleming is a 68 y.o. year old male contacted by phone. Clinical Social Work was referred by medical provider for assessment of psychosocial needs.   SDOH (Social Determinants of Health) assessments performed: Yes SDOH Interventions    Flowsheet Row Telephone from 02/05/2024 in [facility]PART[facility]  SDOH Interventions   Food Insecurity Interventions Intervention Not Indicated  Housing Interventions Intervention Not Indicated  Transportation Interventions Intervention Not Indicated, Patient Resources (Friends/Family)  Utilities Interventions Intervention Not Indicated       SDOH Screenings   Food Insecurity: No Food Insecurity (02/05/2024)  Housing: Low Risk  (02/05/2024)  Transportation Needs: No Transportation Needs (02/05/2024)  Utilities: Not At Risk (02/05/2024)  Depression (PHQ2-9): Low Risk  (09/14/2023)  Social Connections: Patient Declined (01/31/2024)  Tobacco Use: Medium Risk (03/04/2024)     Distress Screen completed: No     No data to display            Family/Social Information:  Housing Arrangement: patient lives with 2 of his sons.   Family members/support persons in your life? Pt reports he has adequate support from family and friends, including his ex-wife who will be available to offer any assistance he may need.  Transportation concerns: no  Employment: Working part time as a Electrical engineer.  Pt is retired from [employer], stating at this time he continues to work to get out of the house versus a financial need.    Income source: Actor concerns: No Type of concern: None Food access concerns: no Religious or spiritual practice: Yes-Christian Advanced directives: Not known Services Currently in place:  none  Coping/ Adjustment to diagnosis: Patient understands treatment plan and what happens next? yes Concerns about diagnosis and/or treatment:  Overwhelmed by information Patient reported stressors: Adjusting to my illness Hopes and/or priorities: Pt's priority is to continue treatment w/ the hope of positive results. Patient enjoys time with family/ friends Current coping skills/ strengths: Capable of independent living , Motivation for treatment/growth , Physical Health , and Supportive family/friends     SUMMARY: Current SDOH Barriers:  No barriers identified at this time.   Clinical Social Work Clinical Goal(s):  No clinical social work goals at this time  Interventions: Discussed common feeling and emotions when being diagnosed with cancer, and the importance of support during treatment Informed patient of the support team roles and support services at [facility] Provided CSW contact information and encouraged patient to call with any questions or concerns Pt declined referral to [employer].   Follow Up Plan: Patient will contact CSW with any support or resource needs Patient verbalizes understanding of plan: Yes    Rachel Moulds, LCSW Clinical Social Worker [facility]

## 2024-03-05 NOTE — Progress Notes (Signed)
 24 hour call back,  No answer, message left.

## 2024-03-09 NOTE — Progress Notes (Signed)
 [facility] [address], [place] [address]    Clinic Day:  03/10/2024  Referring physician: Benita Stabile, MD  Patient Care Team: Benita Stabile, MD as PCP - General (Internal Medicine) Doreatha Massed, MD as Medical Oncologist (Medical Oncology) Therese Sarah, RN as Oncology Nurse Navigator (Medical Oncology)   ASSESSMENT & PLAN:   Assessment: 1.  Left facial soft tissue mass: - Left facial mass for the last 4-5 months, gradually increasing.  No hearing loss/vision changes. - No fevers/night sweats.  57 pound weight loss in the last 2 years, has been on Mounjaro for the last 1 and half year. - Ultrasound (11/30/2023): Complex 3.6 x 1.1 x 2.9 cm cystic structure in front of the left ear. - CT head (12/27/2022): Spiculated soft tissue mass around the left zygomatic arch measuring 2.2 cm in thickness and 4.3 cm in length.  Multifocal lobulated and infiltrative appearing soft tissue along the deep posterior left scalp convexity might be in part abnormal left level 5 lymph nodes. - PET scan (01/23/2024): Hypermetabolic enlargement of the left muscle of mastication just beneath the zygomatic arch with hypermetabolic left level 2, level 3, left posterior triangle lymph nodes.  Less well-defined hypermetabolic tissue within the right parotid gland.  Moderate hypermetabolic inguinal nodes with normal morphology are indeterminate.  1.8 x 1.4 spiculated left hilar mass encasing the left lower lobe superior segmental bronchus at its origin. - Left facial mass biopsy (02/07/2024): Follicular lymphoma, favoring classic type.  Proliferative rate by Ki-67 is low. - FLIPI score: 2 (age> 60, stage III/IV), intermediate high risk with 2-year OS 94%, 5-year OS 78% and 10-year OS 51%. - Cycle 1 Bendamustine and obinutuzumab (GALLIUM trial) started on 03/03/2024   2. 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 Quit smoking 10-12  years ago of 0.5 to 0.75 ppd since age 39. Asbestos exposure in the [employer]. AFFF exposure. -Mother died of lymphoma. No other family history of cancer. No family history of blood clots.    Plan: 1.  Stage IVB classical follicular lymphoma: - He received cycle 1 on 03/03/2024. - He felt that swelling of the left temporal region already improved.  He did not have any GI side effects.  Also noticed the scalp nodules are less prominent. - Reviewed labs today: Creatinine increased to 1.68 from 1.47.  Blood sugar is 308.  LFTs are normal.  CBC grossly normal. - He will receive 100 mL normal saline today.  Will decrease dexamethasone in the premeds to 10 mg with treatment.  He is already receiving Lantus 52 units daily.  Proceed with day 8 today.  RTC 03/31/2024.   2.  ID prophylaxis: - Continue Bactrim 3 times weekly and acyclovir twice daily.   3.  Left hilar mass, PET positive: - We will reevaluate with PET scan after 3 cycles.  If it is still positive, will consider bronchoscopy and biopsy.  4.  TLS prophylaxis: - Continue allopurinol.  Uric acid is 4.9 today.    No orders of the defined types were placed in this encounter.     I,Katie Daubenspeck,acting as a Neurosurgeon for Doreatha Massed, MD.,have documented all relevant documentation on the behalf of Doreatha Massed, MD,as directed by  Doreatha Massed, MD while in the presence of Doreatha Massed, MD.   I, Doreatha Massed MD, have reviewed the above documentation for accuracy and completeness, and I agree with the above.   Vern Claude  Ellin Saba, MD   3/24/202512:45 PM  CHIEF COMPLAINT:   Diagnosis: follicular lymphoma    Cancer Staging  Follicular lymphoma (HCC) Staging form: Hodgkin and Non-Hodgkin Lymphoma, AJCC 8th Edition - Clinical stage from 02/18/2024: Stage IV (Follicular lymphoma) - Unsigned    Prior Therapy: none  Current Therapy:  Bendamustine and obinutuzumab (GALLIUM trial)    HISTORY OF PRESENT  ILLNESS:   Oncology History  Follicular lymphoma (HCC)  02/18/2024 Initial Diagnosis   Follicular lymphoma (HCC)   03/03/2024 -  Chemotherapy   Patient is on Treatment Plan : NON-HODGKIN'S LYMPHOMA - FOLLICULAR INDUCTION Obinutuzumab +  Bendamustine q28d        INTERVAL HISTORY:   Shawn Fleming is a 68 y.o. male presenting to clinic today for follow up of follicular lymphoma. He was last seen by me on 02/18/24.  Today, he states that he is doing well overall. His appetite level is at 75%. His energy level is at 50%.  PAST MEDICAL HISTORY:   Past Medical History: Past Medical History:  Diagnosis Date   AKI (acute kidney injury) (HCC) 07/29/2020   Arthritis    Diabetes (HCC)    GERD (gastroesophageal reflux disease)    History of kidney stones    Hypercholesteremia    Hypertension    Migraine    Sleep apnea     Surgical History: Past Surgical History:  Procedure Laterality Date   ADENOIDECTOMY     APPENDECTOMY     CENTRAL VENOUS CATHETER INSERTION Left 04/20/2013   Procedure: INSERTION CENTRAL LINE LEFT SUBCLAVIAN;  Surgeon: Fabio Bering, MD;  Location: [facility];  Service: General;  Laterality: Left;   INCISION AND DRAINAGE PERIRECTAL ABSCESS N/A 04/20/2013   Procedure: SHARP SURGICAL DEBRIDEMENT PERINEAL INFECTION;  Surgeon: Fabio Bering, MD;  Location: [facility];  Service: General;  Laterality: N/A;   IR IMAGING GUIDED PORT INSERTION  02/28/2024   KNEE SURGERY Right    arthroscopic   SHOULDER SURGERY Bilateral    removal of bone.   TONSILLECTOMY      Social History: Social History   Socioeconomic History   Marital status: Divorced    Spouse name: Not on file   Number of children: 6   Years of education: Not on file   Highest education level: Not on file  Occupational History   Occupation: Engineer, materials  Tobacco Use   Smoking status: Former    Current packs/day: 0.50    Average packs/day: 0.5 packs/day for 23.0 years (11.5 ttl pk-yrs)    Types: Cigarettes    Smokeless tobacco: Never  Vaping Use   Vaping status: Former  Substance and Sexual Activity   Alcohol use: Yes    Comment: rare   Drug use: No   Sexual activity: Not Currently    Birth control/protection: None  Other Topics Concern   Not on file  Social History Narrative   Not on file   Social Drivers of Health   Financial Resource Strain: Not on file  Food Insecurity: No Food Insecurity (02/05/2024)   Hunger Vital Sign    Worried About Running Out of Food in the Last Year: Never true    Ran Out of Food in the Last Year: Never true  Transportation Needs: No Transportation Needs (02/05/2024)   PRAPARE - Administrator, Civil Service (Medical): No    Lack of Transportation (Non-Medical): No  Physical Activity: Not on file  Stress: Not on file  Social Connections: Patient Declined (01/31/2024)   Social  Connection and Isolation Panel [NHANES]    Frequency of Communication with Friends and Family: Patient declined    Frequency of Social Gatherings with Friends and Family: Patient declined    Attends Religious Services: Patient declined    Database administrator or Organizations: Patient declined    Attends Banker Meetings: Patient declined    Marital Status: Patient declined  Intimate Partner Violence: Not At Risk (02/05/2024)   Humiliation, Afraid, Rape, and Kick questionnaire    Fear of Current or Ex-Partner: No    Emotionally Abused: No    Physically Abused: No    Sexually Abused: No    Family History: Family History  Problem Relation Age of Onset   Cancer Mother    Dementia Father    Colon cancer Neg Hx    Rectal cancer Neg Hx    Stomach cancer Neg Hx    Esophageal cancer Neg Hx     Current Medications:  Current Outpatient Medications:    acyclovir (ZOVIRAX) 400 MG tablet, Take 1 tablet (400 mg total) by mouth daily., Disp: 30 tablet, Rfl: 5   allopurinol (ZYLOPRIM) 300 MG tablet, Take 1 tablet (300 mg total) by mouth daily., Disp: 30  tablet, Rfl: 0   ALPRAZolam (XANAX) 0.5 MG tablet, Take 0.5 mg by mouth 2 (two) times daily., Disp: , Rfl:    apixaban (ELIQUIS) 5 MG TABS tablet, Take 1 tablet (5 mg total) by mouth 2 (two) times daily. Please start around August 31, 2020 after you complete the initial starter pack, Disp: 60 tablet, Rfl: 3   benzonatate (TESSALON) 200 MG capsule, Take 1 capsule (200 mg total) by mouth 3 (three) times daily. For cough, Disp: 20 capsule, Rfl: 0   Cholecalciferol (VITAMIN D3) 5000 units CAPS, Take 2 capsules by mouth daily. , Disp: , Rfl:    furosemide (LASIX) 40 MG tablet, Take 40 mg by mouth daily., Disp: , Rfl:    gabapentin (NEURONTIN) 600 MG tablet, Take 600 mg by mouth 3 (three) times daily., Disp: , Rfl:    guaiFENesin-dextromethorphan (ROBITUSSIN DM) 100-10 MG/5ML syrup, Take 10 mLs by mouth every 4 (four) hours as needed for cough (chest congestion)., Disp: , Rfl:    hydrocortisone (CORTEF) 10 MG tablet, TAKE 1 TABLET DAILY AT 8 A.M. AND 1 TABLET DAILY AT NOON, Disp: 180 tablet, Rfl: 1   insulin glargine (LANTUS) 100 UNIT/ML Solostar Pen, Inject 50 Units into the skin daily., Disp: , Rfl:    lidocaine-prilocaine (EMLA) cream, Apply topically as needed (Place small amount to port site 30 minutes prior to port access.). Apply to affected area once, Disp: 30 g, Rfl: 3   Multiple Vitamins-Minerals (MULTIVITAMIN PO), Take 1 tablet by mouth daily., Disp: , Rfl:    Omega-3 Fatty Acids (FISH OIL) 1200 MG CAPS, Take 1 capsule by mouth daily., Disp: , Rfl:    ondansetron (ZOFRAN) 8 MG tablet, Take 1 tablet (8 mg total) by mouth every 8 (eight) hours as needed for nausea or vomiting. Start on the second day after chemotherapy. Then take as needed for nausea or vomiting., Disp: 30 tablet, Rfl: 1   Oxycodone HCl 10 MG TABS, Take 10 mg by mouth 4 (four) times daily., Disp: , Rfl:    Palonosetron HCl (ALOXI IV), Inject into the vein., Disp: , Rfl:    pantoprazole (PROTONIX) 40 MG tablet, Take 40 mg by  mouth daily., Disp: , Rfl:    potassium chloride (KLOR-CON) 10 MEQ tablet, Take 10 mEq  by mouth daily., Disp: , Rfl:    prochlorperazine (COMPAZINE) 10 MG tablet, Take 1 tablet (10 mg total) by mouth every 6 (six) hours as needed for nausea or vomiting., Disp: 30 tablet, Rfl: 1   rosuvastatin (CRESTOR) 10 MG tablet, Take 10 mg by mouth daily., Disp: , Rfl:    sulfamethoxazole-trimethoprim (BACTRIM DS) 800-160 MG tablet, Take 1 tablet by mouth 3 (three) times a week., Disp: 12 tablet, Rfl: 5   tirzepatide (MOUNJARO) 12.5 MG/0.5ML Pen, Inject 12.5 mg into the skin once a week., Disp: 6 mL, Rfl: 0 No current facility-administered medications for this visit.  Facility-Administered Medications Ordered in Other Visits:    0.9 %  sodium chloride infusion, , Intravenous, Continuous, Doreatha Massed, MD, Last Rate: 10 mL/hr at 03/10/24 1043, New Bag at 03/10/24 1043   0.9 %  sodium chloride infusion, , Intravenous, Once PRN, Doreatha Massed, MD, Last Rate: 999 mL/hr at 03/10/24 1043, New Bag at 03/10/24 1043   heparin lock flush 100 unit/mL, 500 Units, Intracatheter, Once PRN, Doreatha Massed, MD   sodium chloride flush (NS) 0.9 % injection 10 mL, 10 mL, Intracatheter, PRN, Doreatha Massed, MD   Allergies: Allergies  Allergen Reactions   Prednisone Other (See Comments)    Unknown     REVIEW OF SYSTEMS:   Review of Systems  Constitutional:  Negative for chills, fatigue and fever.  HENT:   Negative for lump/mass, mouth sores, nosebleeds, sore throat and trouble swallowing.   Eyes:  Negative for eye problems.  Respiratory:  Negative for cough and shortness of breath.   Cardiovascular:  Negative for chest pain, leg swelling and palpitations.  Gastrointestinal:  Negative for abdominal pain, constipation, diarrhea, nausea and vomiting.  Genitourinary:  Negative for bladder incontinence, difficulty urinating, dysuria, frequency, hematuria and nocturia.   Musculoskeletal:  Negative  for arthralgias, back pain, flank pain, myalgias and neck pain.  Skin:  Negative for itching and rash.  Neurological:  Negative for dizziness, headaches and numbness.  Hematological:  Does not bruise/bleed easily.  Psychiatric/Behavioral:  Positive for sleep disturbance. Negative for depression and suicidal ideas. The patient is not nervous/anxious.   All other systems reviewed and are negative.    VITALS:   There were no vitals taken for this visit.  Wt Readings from Last 3 Encounters:  03/10/24 219 lb 5.7 oz (99.5 kg)  03/03/24 222 lb 12.8 oz (101.1 kg)  02/28/24 212 lb (96.2 kg)    There is no height or weight on file to calculate BMI.  Performance status (ECOG): 1 - Symptomatic but completely ambulatory  PHYSICAL EXAM:   Physical Exam Vitals and nursing note reviewed. Exam conducted with a chaperone present.  Constitutional:      Appearance: Normal appearance.  Cardiovascular:     Rate and Rhythm: Normal rate and regular rhythm.     Pulses: Normal pulses.     Heart sounds: Normal heart sounds.  Pulmonary:     Effort: Pulmonary effort is normal.     Breath sounds: Normal breath sounds.  Abdominal:     Palpations: Abdomen is soft. There is no hepatomegaly, splenomegaly or mass.     Tenderness: There is no abdominal tenderness.  Musculoskeletal:     Right lower leg: No edema.     Left lower leg: No edema.  Lymphadenopathy:     Cervical: No cervical adenopathy.     Right cervical: No superficial, deep or posterior cervical adenopathy.    Left cervical: No superficial, deep or posterior  cervical adenopathy.     Upper Body:     Right upper body: No supraclavicular or axillary adenopathy.     Left upper body: No supraclavicular or axillary adenopathy.  Neurological:     General: No focal deficit present.     Mental Status: He is alert and oriented to person, place, and time.  Psychiatric:        Mood and Affect: Mood normal.        Behavior: Behavior normal.      LABS:   CBC     Component Value Date/Time   WBC 13.5 (H) 03/10/2024 0920   RBC 4.32 03/10/2024 0920   HGB 12.9 (L) 03/10/2024 0920   HCT 37.9 (L) 03/10/2024 0920   PLT 222 03/10/2024 0920   MCV 87.7 03/10/2024 0920   MCH 29.9 03/10/2024 0920   MCHC 34.0 03/10/2024 0920   RDW 13.6 03/10/2024 0920   LYMPHSABS 0.4 (L) 03/10/2024 0920   MONOABS 1.0 03/10/2024 0920   EOSABS 0.1 03/10/2024 0920   BASOSABS 0.0 03/10/2024 0920    CMP      Component Value Date/Time   NA 133 (L) 03/10/2024 0920   NA 143 01/22/2024 1307   K 4.4 03/10/2024 0920   CL 94 (L) 03/10/2024 0920   CO2 27 03/10/2024 0920   GLUCOSE 308 (H) 03/10/2024 0920   BUN 46 (H) 03/10/2024 0920   BUN 17 01/22/2024 1307   CREATININE 1.68 (H) 03/10/2024 0920   CALCIUM 9.5 03/10/2024 0920   PROT 6.5 03/10/2024 0920   PROT 7.0 01/22/2024 1307   ALBUMIN 3.7 03/10/2024 0920   ALBUMIN 4.5 01/22/2024 1307   AST 13 (L) 03/10/2024 0920   ALT 24 03/10/2024 0920   ALKPHOS 55 03/10/2024 0920   BILITOT 0.7 03/10/2024 0920   BILITOT 0.3 01/22/2024 1307   GFRNONAA 44 (L) 03/10/2024 0920   GFRAA 59 (L) 08/01/2020 0758     No results found for: "CEA1", "CEA" / No results found for: "CEA1", "CEA" No results found for: "PSA1" No results found for: "GUY403" No results found for: "CAN125"  No results found for: "TOTALPROTELP", "ALBUMINELP", "A1GS", "A2GS", "BETS", "BETA2SER", "GAMS", "MSPIKE", "SPEI" No results found for: "TIBC", "FERRITIN", "IRONPCTSAT" Lab Results  Component Value Date   LDH 109 02/18/2024   LDH 116 01/15/2024     STUDIES:   IR IMAGING GUIDED PORT INSERTION Result Date: 02/28/2024 INDICATION: 68 year old male referred for port catheter EXAM: IMAGE GUIDED PORT CATHETER MEDICATIONS: None ANESTHESIA/SEDATION: Moderate (conscious) sedation was employed during this procedure. A total of Versed 2.0 mg and Fentanyl 100 mcg was administered intravenously. Moderate Sedation Time: 19 minutes. The  patient's level of consciousness and vital signs were monitored continuously by radiology nursing throughout the procedure under my direct supervision. FLUOROSCOPY TIME:  Fluoroscopy Time:   (5 mGy). COMPLICATIONS: None PROCEDURE: Informed written consent was obtained from the patient after a discussion of the risks, benefits, and alternatives to treatment. Questions regarding the procedure were encouraged and answered. The right neck and chest were prepped with chlorhexidine in a sterile fashion, and a sterile drape was applied covering the operative field. Maximum barrier sterile technique with sterile gowns and gloves were used for the procedure. A timeout was performed prior to the initiation of the procedure. Ultrasound survey was performed with images stored and sent to PACs. Right IJ vein documented to be patent. The right neck and chest was prepped with chlorhexidine, and draped in the usual sterile fashion using maximum barrier technique (cap  and mask, sterile gown, sterile gloves, large sterile sheet, hand hygiene and cutaneous antiseptic). Local anesthesia was attained by infiltration with 1% lidocaine without epinephrine. Ultrasound demonstrated patency of the right internal jugular vein, and this was documented with an image. Under real-time ultrasound guidance, this vein was accessed with a 21 gauge micropuncture needle and image documentation was performed. A small dermatotomy was made at the access site with an 11 scalpel. A 0.018" wire was advanced into the SVC and used to estimate the length of the internal catheter. The access needle exchanged for a 72F micropuncture vascular sheath. The 0.018" wire was then removed and a 0.035" wire advanced into the IVC. An appropriate location for the subcutaneous reservoir was selected below the clavicle and an incision was made through the skin and underlying soft tissues. The subcutaneous tissues were then dissected using a combination of blunt and sharp  surgical technique and a pocket was formed. A single lumen power injectable portacatheter was then tunneled through the subcutaneous tissues from the pocket to the dermatotomy and the port reservoir placed within the subcutaneous pocket. The venous access site was then serially dilated and a peel away vascular sheath placed over the wire. The wire was removed and the port catheter advanced into position under fluoroscopic guidance. The catheter tip is positioned in the cavoatrial junction. This was documented with a spot image. The portacatheter was then tested and found to flush and aspirate well. The port was flushed with saline followed by 100 units/mL heparinized saline. The pocket was then closed in two layers using first subdermal inverted interrupted absorbable sutures followed by a running subcuticular suture. The epidermis was then sealed with Dermabond. The dermatotomy at the venous access site was also seal with Dermabond. Patient tolerated the procedure well and remained hemodynamically stable throughout. No complications encountered and no significant blood loss encountered IMPRESSION: Status post right IJ port catheter Signed, Yvone Neu. Miachel Roux, RPVI [facility] Radiology Electronically Signed   By: Gilmer Mor D.O.   On: 02/28/2024 14:32

## 2024-03-10 ENCOUNTER — Inpatient Hospital Stay (HOSPITAL_BASED_OUTPATIENT_CLINIC_OR_DEPARTMENT_OTHER): Admitting: Hematology

## 2024-03-10 ENCOUNTER — Inpatient Hospital Stay

## 2024-03-10 VITALS — BP 93/66 | HR 89 | Temp 96.7°F | Resp 18

## 2024-03-10 VITALS — BP 103/74 | HR 111 | Temp 96.4°F | Resp 20 | Wt 219.4 lb

## 2024-03-10 DIAGNOSIS — E1110 Type 2 diabetes mellitus with ketoacidosis without coma: Secondary | ICD-10-CM

## 2024-03-10 DIAGNOSIS — Z5112 Encounter for antineoplastic immunotherapy: Secondary | ICD-10-CM | POA: Diagnosis not present

## 2024-03-10 DIAGNOSIS — Z95828 Presence of other vascular implants and grafts: Secondary | ICD-10-CM

## 2024-03-10 DIAGNOSIS — C8259 Diffuse follicle center lymphoma, extranodal and solid organ sites: Secondary | ICD-10-CM | POA: Diagnosis not present

## 2024-03-10 DIAGNOSIS — R739 Hyperglycemia, unspecified: Secondary | ICD-10-CM

## 2024-03-10 DIAGNOSIS — E119 Type 2 diabetes mellitus without complications: Secondary | ICD-10-CM

## 2024-03-10 LAB — CBC WITH DIFFERENTIAL/PLATELET
Abs Immature Granulocytes: 0.14 10*3/uL — ABNORMAL HIGH (ref 0.00–0.07)
Basophils Absolute: 0 10*3/uL (ref 0.0–0.1)
Basophils Relative: 0 %
Eosinophils Absolute: 0.1 10*3/uL (ref 0.0–0.5)
Eosinophils Relative: 1 %
HCT: 37.9 % — ABNORMAL LOW (ref 39.0–52.0)
Hemoglobin: 12.9 g/dL — ABNORMAL LOW (ref 13.0–17.0)
Immature Granulocytes: 1 %
Lymphocytes Relative: 3 %
Lymphs Abs: 0.4 10*3/uL — ABNORMAL LOW (ref 0.7–4.0)
MCH: 29.9 pg (ref 26.0–34.0)
MCHC: 34 g/dL (ref 30.0–36.0)
MCV: 87.7 fL (ref 80.0–100.0)
Monocytes Absolute: 1 10*3/uL (ref 0.1–1.0)
Monocytes Relative: 7 %
Neutro Abs: 11.8 10*3/uL — ABNORMAL HIGH (ref 1.7–7.7)
Neutrophils Relative %: 88 %
Platelets: 222 10*3/uL (ref 150–400)
RBC: 4.32 MIL/uL (ref 4.22–5.81)
RDW: 13.6 % (ref 11.5–15.5)
WBC: 13.5 10*3/uL — ABNORMAL HIGH (ref 4.0–10.5)
nRBC: 0 % (ref 0.0–0.2)

## 2024-03-10 LAB — URIC ACID: Uric Acid, Serum: 4.9 mg/dL (ref 3.7–8.6)

## 2024-03-10 LAB — MAGNESIUM: Magnesium: 2.3 mg/dL (ref 1.7–2.4)

## 2024-03-10 LAB — COMPREHENSIVE METABOLIC PANEL
ALT: 24 U/L (ref 0–44)
AST: 13 U/L — ABNORMAL LOW (ref 15–41)
Albumin: 3.7 g/dL (ref 3.5–5.0)
Alkaline Phosphatase: 55 U/L (ref 38–126)
Anion gap: 12 (ref 5–15)
BUN: 46 mg/dL — ABNORMAL HIGH (ref 8–23)
CO2: 27 mmol/L (ref 22–32)
Calcium: 9.5 mg/dL (ref 8.9–10.3)
Chloride: 94 mmol/L — ABNORMAL LOW (ref 98–111)
Creatinine, Ser: 1.68 mg/dL — ABNORMAL HIGH (ref 0.61–1.24)
GFR, Estimated: 44 mL/min — ABNORMAL LOW (ref >60)
Glucose, Bld: 308 mg/dL — ABNORMAL HIGH (ref 70–99)
Potassium: 4.4 mmol/L (ref 3.5–5.1)
Sodium: 133 mmol/L — ABNORMAL LOW (ref 135–145)
Total Bilirubin: 0.7 mg/dL (ref 0.0–1.2)
Total Protein: 6.5 g/dL (ref 6.5–8.1)

## 2024-03-10 MED ORDER — CETIRIZINE HCL 10 MG/ML IV SOLN
10.0000 mg | Freq: Once | INTRAVENOUS | Status: AC
  Administered 2024-03-10: 10 mg via INTRAVENOUS
  Filled 2024-03-10: qty 1

## 2024-03-10 MED ORDER — ACETAMINOPHEN 325 MG PO TABS
650.0000 mg | ORAL_TABLET | Freq: Once | ORAL | Status: AC
Start: 2024-03-10 — End: 2024-03-10
  Administered 2024-03-10: 650 mg via ORAL
  Filled 2024-03-10: qty 2

## 2024-03-10 MED ORDER — SODIUM CHLORIDE 0.9 % IV SOLN: INTRAVENOUS | Status: DC

## 2024-03-10 MED ORDER — SODIUM CHLORIDE 0.9 % IV SOLN
1000.0000 mg | Freq: Once | INTRAVENOUS | Status: AC
  Administered 2024-03-10: 1000 mg via INTRAVENOUS
  Filled 2024-03-10: qty 40

## 2024-03-10 MED ORDER — LORAZEPAM 2 MG/ML IJ SOLN
0.5000 mg | Freq: Once | INTRAMUSCULAR | Status: AC
Start: 2024-03-10 — End: 2024-03-10
  Administered 2024-03-10: 0.5 mg via INTRAVENOUS
  Filled 2024-03-10: qty 1

## 2024-03-10 MED ORDER — METHYLPREDNISOLONE SODIUM SUCC 125 MG IJ SOLR: 125.0000 mg | Freq: Once | INTRAMUSCULAR | Status: DC | PRN

## 2024-03-10 MED ORDER — INSULIN ASPART 100 UNIT/ML IJ SOLN
10.0000 [IU] | Freq: Once | INTRAMUSCULAR | Status: AC
  Administered 2024-03-10: 10 [IU] via SUBCUTANEOUS
  Filled 2024-03-10: qty 1

## 2024-03-10 MED ORDER — DIPHENHYDRAMINE HCL 50 MG/ML IJ SOLN: 50.0000 mg | Freq: Once | INTRAMUSCULAR | Status: DC | PRN

## 2024-03-10 MED ORDER — ALBUTEROL SULFATE HFA 108 (90 BASE) MCG/ACT IN AERS: 2.0000 | INHALATION_SPRAY | Freq: Once | RESPIRATORY_TRACT | Status: DC | PRN

## 2024-03-10 MED ORDER — DEXAMETHASONE SODIUM PHOSPHATE 10 MG/ML IJ SOLN
10.0000 mg | Freq: Once | INTRAMUSCULAR | Status: AC
  Administered 2024-03-10: 10 mg via INTRAVENOUS
  Filled 2024-03-10: qty 1

## 2024-03-10 MED ORDER — EPINEPHRINE 0.3 MG/0.3ML IJ SOAJ: 0.3000 mg | Freq: Once | INTRAMUSCULAR | Status: DC | PRN

## 2024-03-10 MED ORDER — SODIUM CHLORIDE 0.9% FLUSH
10.0000 mL | INTRAVENOUS | Status: DC | PRN
  Administered 2024-03-10: 10 mL

## 2024-03-10 MED ORDER — SODIUM CHLORIDE 0.9% FLUSH
10.0000 mL | INTRAVENOUS | Status: DC | PRN
  Administered 2024-03-10: 10 mL via INTRAVENOUS

## 2024-03-10 MED ORDER — HEPARIN SOD (PORK) LOCK FLUSH 100 UNIT/ML IV SOLN
500.0000 [IU] | Freq: Once | INTRAVENOUS | Status: AC | PRN
  Administered 2024-03-10: 500 [IU]

## 2024-03-10 MED ORDER — SODIUM CHLORIDE 0.9 % IV SOLN: Freq: Once | INTRAVENOUS | Status: DC | PRN

## 2024-03-10 MED ORDER — SODIUM CHLORIDE 0.9 % IV SOLN: 20.0000 mg | Freq: Once | INTRAVENOUS | Status: DC

## 2024-03-10 MED ORDER — FAMOTIDINE IN NACL 20-0.9 MG/50ML-% IV SOLN: 20.0000 mg | Freq: Once | INTRAVENOUS | Status: DC | PRN

## 2024-03-10 MED ORDER — FAMOTIDINE IN NACL 20-0.9 MG/50ML-% IV SOLN
20.0000 mg | Freq: Once | INTRAVENOUS | Status: AC
Start: 2024-03-10 — End: 2024-03-10
  Administered 2024-03-10: 20 mg via INTRAVENOUS
  Filled 2024-03-10: qty 50

## 2024-03-10 NOTE — Patient Instructions (Addendum)
 [facility] at [facility] Discharge Instructions   You were seen and examined today by Dr. Ellin Saba.  He reviewed the results of your lab work which are normal/stable.   We will proceed with your treatment today.   Stop taking the dexamethasone pill at home.   Return as scheduled.    Thank you for choosing [facility] at [facility] to provide your oncology and hematology care.  To afford each patient quality time with our provider, please arrive at least 15 minutes before your scheduled appointment time.   If you have a lab appointment with the Cancer Center please come in thru the Main Entrance and check in at the main information desk.  You need to re-schedule your appointment should you arrive 10 or more minutes late.  We strive to give you quality time with our providers, and arriving late affects you and other patients whose appointments are after yours.  Also, if you no show three or more times for appointments you may be dismissed from the clinic at the providers discretion.     Again, thank you for choosing [facility].  Our hope is that these requests will decrease the amount of time that you wait before being seen by our physicians.       _____________________________________________________________  Should you have questions after your visit to [facility], please contact our office at [phone] and follow the prompts.  Our office hours are 8:00 a.m. and 4:30 p.m. Monday - Friday.  Please note that voicemails left after 4:00 p.m. may not be returned until the following business day.  We are closed weekends and major holidays.  You do have access to a nurse 24-7, just call the main number to the clinic [phone] and do not press any options, hold on the line and a nurse will answer the phone.    For prescription refill requests, have your pharmacy contact our office and allow 72 hours.    Due to  Covid, you will need to wear a mask upon entering the hospital. If you do not have a mask, a mask will be given to you at the Main Entrance upon arrival. For doctor visits, patients may have 1 support person age 22 or older with them. For treatment visits, patients can not have anyone with them due to social distancing guidelines and our immunocompromised population.

## 2024-03-10 NOTE — Progress Notes (Signed)
 Patient has been examined by Dr. Ellin Saba. Vital signs and labs have been reviewed by MD - ANC, Creatinine (1.68), LFTs, hemoglobin, and platelets are within treatment parameters per M.D. - pt may proceed with treatment.  Primary RN and pharmacy notified.

## 2024-03-10 NOTE — Patient Instructions (Signed)
 [facility] [place] - A DEPT OF [facility]  Discharge Instructions: Thank you for choosing [facility] to provide your oncology and hematology care.  If you have a lab appointment with the Cancer Center - please note that after April 8th, 2024, all labs will be drawn in the cancer center.  You do not have to check in or register with the main entrance as you have in the past but will complete your check-in in the cancer center.  Wear comfortable clothing and clothing appropriate for easy access to any Portacath or PICC line.   We strive to give you quality time with your provider. You may need to reschedule your appointment if you arrive late (15 or more minutes).  Arriving late affects you and other patients whose appointments are after yours.  Also, if you miss three or more appointments without notifying the office, you may be dismissed from the clinic at the provider's discretion.      For prescription refill requests, have your pharmacy contact our office and allow 72 hours for refills to be completed.    Today you received the following chemotherapy and/or immunotherapy agents Gazyva   To help prevent nausea and vomiting after your treatment, we encourage you to take your nausea medication as directed.  BELOW ARE SYMPTOMS THAT SHOULD BE REPORTED IMMEDIATELY: *FEVER GREATER THAN 100.4 F (38 C) OR HIGHER *CHILLS OR SWEATING *NAUSEA AND VOMITING THAT IS NOT CONTROLLED WITH YOUR NAUSEA MEDICATION *UNUSUAL SHORTNESS OF BREATH *UNUSUAL BRUISING OR BLEEDING *URINARY PROBLEMS (pain or burning when urinating, or frequent urination) *BOWEL PROBLEMS (unusual diarrhea, constipation, pain near the anus) TENDERNESS IN MOUTH AND THROAT WITH OR WITHOUT PRESENCE OF ULCERS (sore throat, sores in mouth, or a toothache) UNUSUAL RASH, SWELLING OR PAIN  UNUSUAL VAGINAL DISCHARGE OR ITCHING   Items with * indicate a potential emergency and should be followed up as  soon as possible or go to the Emergency Department if any problems should occur.  Please show the CHEMOTHERAPY ALERT CARD or IMMUNOTHERAPY ALERT CARD at check-in to the Emergency Department and triage nurse.  Should you have questions after your visit or need to cancel or reschedule your appointment, please contact [facility] [place] - A DEPT OF [facility] [phone]  and follow the prompts.  Office hours are 8:00 a.m. to 4:30 p.m. Monday - Friday. Please note that voicemails left after 4:00 p.m. may not be returned until the following business day.  We are closed weekends and major holidays. You have access to a nurse at all times for urgent questions. Please call the main number to the clinic [phone] and follow the prompts.  For any non-urgent questions, you may also contact your provider using MyChart. We now offer e-Visits for anyone 59 and older to request care online for non-urgent symptoms. For details visit [email or web address].   Also download the MyChart app! Go to the app store, search "MyChart", open the app, select [place], and log in with your MyChart username and password.

## 2024-03-10 NOTE — Progress Notes (Signed)
 Patients port flushed without difficulty.  Good blood return noted with no bruising or swelling noted at site.  Patient remains accessed for treatment.

## 2024-03-10 NOTE — Progress Notes (Signed)
 Labs reviewed with MD today . Ok to treat per MD. MD aware of blood sugar and kidney function. Will give bolus of saline per MD   Patient has both lymphoma and CLL.  Per Guidelines, will follow follicular lymphoma infusion rates today .    Blood pressure has been decreasing since Gazyva started, MD made aware, pt is asymptomatic. Per MD, will give another 500 ml bolus of saline  Treatment given per orders. Patient tolerated it well without problems. Vitals stable and discharged home from clinic ambulatory. Follow up as scheduled.

## 2024-03-10 NOTE — Progress Notes (Signed)
 Dexamethasone dose reduced to 10mg  per Chapman Moss, Rn/Dr. Ellin Saba. Pt will receive Novolog 10 units for elevated blood glucose.  Drusilla Kanner, PharmD, MBA

## 2024-03-17 ENCOUNTER — Inpatient Hospital Stay

## 2024-03-17 VITALS — BP 110/72 | HR 50 | Temp 97.1°F | Resp 17

## 2024-03-17 DIAGNOSIS — C8259 Diffuse follicle center lymphoma, extranodal and solid organ sites: Secondary | ICD-10-CM

## 2024-03-17 DIAGNOSIS — Z5112 Encounter for antineoplastic immunotherapy: Secondary | ICD-10-CM | POA: Diagnosis not present

## 2024-03-17 LAB — CBC WITH DIFFERENTIAL/PLATELET
Abs Immature Granulocytes: 0.07 10*3/uL (ref 0.00–0.07)
Basophils Absolute: 0 10*3/uL (ref 0.0–0.1)
Basophils Relative: 0 %
Eosinophils Absolute: 0.2 10*3/uL (ref 0.0–0.5)
Eosinophils Relative: 2 %
HCT: 32.3 % — ABNORMAL LOW (ref 39.0–52.0)
Hemoglobin: 10.8 g/dL — ABNORMAL LOW (ref 13.0–17.0)
Immature Granulocytes: 1 %
Lymphocytes Relative: 2 %
Lymphs Abs: 0.2 10*3/uL — ABNORMAL LOW (ref 0.7–4.0)
MCH: 30.2 pg (ref 26.0–34.0)
MCHC: 33.4 g/dL (ref 30.0–36.0)
MCV: 90.2 fL (ref 80.0–100.0)
Monocytes Absolute: 0.7 10*3/uL (ref 0.1–1.0)
Monocytes Relative: 7 %
Neutro Abs: 8.6 10*3/uL — ABNORMAL HIGH (ref 1.7–7.7)
Neutrophils Relative %: 88 %
Platelets: 133 10*3/uL — ABNORMAL LOW (ref 150–400)
RBC: 3.58 MIL/uL — ABNORMAL LOW (ref 4.22–5.81)
RDW: 15 % (ref 11.5–15.5)
WBC: 9.9 10*3/uL (ref 4.0–10.5)
nRBC: 0 % (ref 0.0–0.2)

## 2024-03-17 LAB — MAGNESIUM: Magnesium: 1.8 mg/dL (ref 1.7–2.4)

## 2024-03-17 LAB — COMPREHENSIVE METABOLIC PANEL WITH GFR
ALT: 22 U/L (ref 0–44)
AST: 20 U/L (ref 15–41)
Albumin: 3.1 g/dL — ABNORMAL LOW (ref 3.5–5.0)
Alkaline Phosphatase: 47 U/L (ref 38–126)
Anion gap: 11 (ref 5–15)
BUN: 22 mg/dL (ref 8–23)
CO2: 25 mmol/L (ref 22–32)
Calcium: 8.8 mg/dL — ABNORMAL LOW (ref 8.9–10.3)
Chloride: 98 mmol/L (ref 98–111)
Creatinine, Ser: 1.73 mg/dL — ABNORMAL HIGH (ref 0.61–1.24)
GFR, Estimated: 42 mL/min — ABNORMAL LOW (ref >60)
Glucose, Bld: 209 mg/dL — ABNORMAL HIGH (ref 70–99)
Potassium: 4.1 mmol/L (ref 3.5–5.1)
Sodium: 134 mmol/L — ABNORMAL LOW (ref 135–145)
Total Bilirubin: 0.8 mg/dL (ref 0.0–1.2)
Total Protein: 6.5 g/dL (ref 6.5–8.1)

## 2024-03-17 LAB — URIC ACID: Uric Acid, Serum: 5 mg/dL (ref 3.7–8.6)

## 2024-03-17 MED ORDER — SODIUM CHLORIDE 0.9% FLUSH
10.0000 mL | INTRAVENOUS | Status: AC
  Administered 2024-03-17: 10 mL

## 2024-03-17 MED ORDER — CETIRIZINE HCL 10 MG/ML IV SOLN
10.0000 mg | Freq: Once | INTRAVENOUS | Status: AC
  Administered 2024-03-17: 10 mg via INTRAVENOUS
  Filled 2024-03-17: qty 1

## 2024-03-17 MED ORDER — LORAZEPAM 2 MG/ML IJ SOLN
0.5000 mg | Freq: Once | INTRAMUSCULAR | Status: AC
  Administered 2024-03-17: 0.5 mg via INTRAVENOUS
  Filled 2024-03-17: qty 1

## 2024-03-17 MED ORDER — ACETAMINOPHEN 325 MG PO TABS
650.0000 mg | ORAL_TABLET | Freq: Once | ORAL | Status: AC
  Administered 2024-03-17: 650 mg via ORAL
  Filled 2024-03-17: qty 2

## 2024-03-17 MED ORDER — SODIUM CHLORIDE 0.9 % IV SOLN
20.0000 mg | Freq: Once | INTRAVENOUS | Status: AC
  Administered 2024-03-17: 20 mg via INTRAVENOUS
  Filled 2024-03-17: qty 20

## 2024-03-17 MED ORDER — SODIUM CHLORIDE 0.9 % IV SOLN: INTRAVENOUS | Status: DC

## 2024-03-17 MED ORDER — SODIUM CHLORIDE 0.9% FLUSH
10.0000 mL | INTRAVENOUS | Status: DC | PRN
Start: 2024-03-17 — End: 2024-03-17
  Administered 2024-03-17: 10 mL

## 2024-03-17 MED ORDER — FAMOTIDINE IN NACL 20-0.9 MG/50ML-% IV SOLN
20.0000 mg | Freq: Once | INTRAVENOUS | Status: AC
  Administered 2024-03-17: 20 mg via INTRAVENOUS
  Filled 2024-03-17: qty 50

## 2024-03-17 MED ORDER — SODIUM CHLORIDE 0.9 % IV SOLN
1000.0000 mg | Freq: Once | INTRAVENOUS | Status: AC
  Administered 2024-03-17: 1000 mg via INTRAVENOUS
  Filled 2024-03-17: qty 40

## 2024-03-17 MED ORDER — HEPARIN SOD (PORK) LOCK FLUSH 100 UNIT/ML IV SOLN
500.0000 [IU] | Freq: Once | INTRAVENOUS | Status: AC | PRN
  Administered 2024-03-17: 500 [IU]

## 2024-03-17 NOTE — Progress Notes (Signed)
 Patient presents today for Gazyva infusion per providers order.  Labs and vital signs within parameters for treatment.  No new complaints at this time.    Treatment given today per MD orders.  Stable during infusion without adverse affects.  Vital signs stable.  No complaints at this time.  Discharge from clinic ambulatory in stable condition.  Alert and oriented X 3.  Follow up with [facility] as scheduled.

## 2024-03-17 NOTE — Patient Instructions (Signed)
 [facility] [place] - A DEPT OF [facility]  Discharge Instructions: Thank you for choosing [facility] to provide your oncology and hematology care.  If you have a lab appointment with the Cancer Center - please note that after April 8th, 2024, all labs will be drawn in the cancer center.  You do not have to check in or register with the main entrance as you have in the past but will complete your check-in in the cancer center.  Wear comfortable clothing and clothing appropriate for easy access to any Portacath or PICC line.   We strive to give you quality time with your provider. You may need to reschedule your appointment if you arrive late (15 or more minutes).  Arriving late affects you and other patients whose appointments are after yours.  Also, if you miss three or more appointments without notifying the office, you may be dismissed from the clinic at the provider's discretion.      For prescription refill requests, have your pharmacy contact our office and allow 72 hours for refills to be completed.    Today you received the following chemotherapy and/or immunotherapy agents Gazyva   To help prevent nausea and vomiting after your treatment, we encourage you to take your nausea medication as directed.  BELOW ARE SYMPTOMS THAT SHOULD BE REPORTED IMMEDIATELY: *FEVER GREATER THAN 100.4 F (38 C) OR HIGHER *CHILLS OR SWEATING *NAUSEA AND VOMITING THAT IS NOT CONTROLLED WITH YOUR NAUSEA MEDICATION *UNUSUAL SHORTNESS OF BREATH *UNUSUAL BRUISING OR BLEEDING *URINARY PROBLEMS (pain or burning when urinating, or frequent urination) *BOWEL PROBLEMS (unusual diarrhea, constipation, pain near the anus) TENDERNESS IN MOUTH AND THROAT WITH OR WITHOUT PRESENCE OF ULCERS (sore throat, sores in mouth, or a toothache) UNUSUAL RASH, SWELLING OR PAIN  UNUSUAL VAGINAL DISCHARGE OR ITCHING   Items with * indicate a potential emergency and should be followed up as  soon as possible or go to the Emergency Department if any problems should occur.  Please show the CHEMOTHERAPY ALERT CARD or IMMUNOTHERAPY ALERT CARD at check-in to the Emergency Department and triage nurse.  Should you have questions after your visit or need to cancel or reschedule your appointment, please contact [facility] [place] - A DEPT OF [facility] [phone]  and follow the prompts.  Office hours are 8:00 a.m. to 4:30 p.m. Monday - Friday. Please note that voicemails left after 4:00 p.m. may not be returned until the following business day.  We are closed weekends and major holidays. You have access to a nurse at all times for urgent questions. Please call the main number to the clinic [phone] and follow the prompts.  For any non-urgent questions, you may also contact your provider using MyChart. We now offer e-Visits for anyone 59 and older to request care online for non-urgent symptoms. For details visit [email or web address].   Also download the MyChart app! Go to the app store, search "MyChart", open the app, select [place], and log in with your MyChart username and password.

## 2024-03-30 NOTE — Progress Notes (Signed)
 [facility] [address], [place] [address]    Clinic Day:  03/31/2024  Referring physician: Omie Bickers, MD  Patient Care Team: Shawn Bickers, MD as PCP - General (Internal Medicine) Shawn Boros, MD as Medical Oncologist (Medical Oncology) Gerhard Knuckles, RN as Oncology Nurse Navigator (Medical Oncology)   ASSESSMENT & PLAN:   Assessment: 1.  Left facial soft tissue mass: - Left facial mass for the last 4-5 months, gradually increasing.  No hearing loss/vision changes. - No fevers/night sweats.  57 pound weight loss in the last 2 years, has been on Mounjaro for the last 1 and half year. - Ultrasound (11/30/2023): Complex 3.6 x 1.1 x 2.9 cm cystic structure in front of the left ear. - CT head (12/27/2022): Spiculated soft tissue mass around the left zygomatic arch measuring 2.2 cm in thickness and 4.3 cm in length.  Multifocal lobulated and infiltrative appearing soft tissue along the deep posterior left scalp convexity might be in part abnormal left level 5 lymph nodes. - PET scan (01/23/2024): Hypermetabolic enlargement of the left muscle of mastication just beneath the zygomatic arch with hypermetabolic left level 2, level 3, left posterior triangle lymph nodes.  Less well-defined hypermetabolic tissue within the right parotid gland.  Moderate hypermetabolic inguinal nodes with normal morphology are indeterminate.  1.8 x 1.4 spiculated left hilar mass encasing the left lower lobe superior segmental bronchus at its origin. - Left facial mass biopsy (02/07/2024): Follicular lymphoma, favoring classic type.  Proliferative rate by Ki-67 is low. - FLIPI score: 2 (age> 60, stage III/IV), intermediate high risk with 2-year OS 94%, 5-year OS 78% and 10-year OS 51%. - Cycle 1 Bendamustine and obinutuzumab (GALLIUM trial) started on 03/03/2024   2. 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 Quit smoking 10-12  years ago of 0.5 to 0.75 ppd since age 31. Asbestos exposure in the [employer]. AFFF exposure. -Mother died of lymphoma. No other family history of cancer. No family history of blood clots.    Plan: 1.  Stage IVB classical follicular lymphoma: - He has completed cycle 1 of Bendamustine and obinutuzumab on 03/03/2024. - No major GI side effects.  Had 2 episodes of urinary incontinence when he slept more than 6 hours.  Denies any dysuria. - Left temporal swelling has decreased completely. - Labs today: Normal LFTs.  Creatinine 1.27.  CBC grossly normal. - Proceed with cycle 2 today with Bendamustine at 70 mg/m.  RTC 4 weeks for follow-up.   2.  ID prophylaxis: - He is taking Bactrim Monday Wednesday and Friday.  He has not started acyclovir.  I have sent a prescription to [facility] again.  He will start taking it twice daily.   3.  Left hilar mass, PET positive: - Will follow-up on the repeat PET scan after 3 cycles.  If it is still positive will consider bronchoscopy and biopsy.  It is presumed to be lymphoma.   4.  TLS prophylaxis: - Completed allopurinol 1 month.  Uric acid is 5.1 today.  Will closely monitor.    No orders of the defined types were placed in this encounter.     I,Shawn Fleming,acting as a Neurosurgeon for Shawn Boros, MD.,have documented all relevant documentation on the behalf of Shawn Boros, MD,as directed by  Shawn Boros, MD while in the presence of Shawn Boros, MD.   I, Shawn Boros MD, have reviewed the above documentation for accuracy  and completeness, and I agree with the above.   Shawn Boros, MD   4/14/20259:44 AM  CHIEF COMPLAINT:   Diagnosis: follicular lymphoma    Cancer Staging  Follicular lymphoma (HCC) Staging form: Hodgkin and Non-Hodgkin Lymphoma, AJCC 8th Edition - Clinical stage from 02/18/2024: Stage IV (Follicular lymphoma) - Unsigned    Prior Therapy: none  Current Therapy:  Bendamustine and  obinutuzumab (GALLIUM trial)    HISTORY OF PRESENT ILLNESS:   Oncology History  Follicular lymphoma (HCC)  02/18/2024 Initial Diagnosis   Follicular lymphoma (HCC)   03/03/2024 -  Chemotherapy   Patient is on Treatment Plan : NON-HODGKIN'S LYMPHOMA - FOLLICULAR INDUCTION Obinutuzumab +  Bendamustine q28d        INTERVAL HISTORY:   Shawn Fleming is a 68 y.o. male presenting to clinic today for follow up of follicular lymphoma. He was last seen by me on 03/10/24.  Today, he states that he is doing well overall. His appetite level is at 90%. His energy level is at 60%.  PAST MEDICAL HISTORY:   Past Medical History: Past Medical History:  Diagnosis Date   AKI (acute kidney injury) (HCC) 07/29/2020   Arthritis    Diabetes (HCC)    GERD (gastroesophageal reflux disease)    History of kidney stones    Hypercholesteremia    Hypertension    Migraine    Sleep apnea     Surgical History: Past Surgical History:  Procedure Laterality Date   ADENOIDECTOMY     APPENDECTOMY     CENTRAL VENOUS CATHETER INSERTION Left 04/20/2013   Procedure: INSERTION CENTRAL LINE LEFT SUBCLAVIAN;  Surgeon: Lovena Rubinstein, MD;  Location: [facility];  Service: General;  Laterality: Left;   INCISION AND DRAINAGE PERIRECTAL ABSCESS N/A 04/20/2013   Procedure: SHARP SURGICAL DEBRIDEMENT PERINEAL INFECTION;  Surgeon: Lovena Rubinstein, MD;  Location: [facility];  Service: General;  Laterality: N/A;   IR IMAGING GUIDED PORT INSERTION  02/28/2024   KNEE SURGERY Right    arthroscopic   SHOULDER SURGERY Bilateral    removal of bone.   TONSILLECTOMY      Social History: Social History   Socioeconomic History   Marital status: Divorced    Spouse name: Not on file   Number of children: 6   Years of education: Not on file   Highest education level: Not on file  Occupational History   Occupation: Engineer, materials  Tobacco Use   Smoking status: Former    Current packs/day: 0.50    Average packs/day: 0.5 packs/day for 23.0  years (11.5 ttl pk-yrs)    Types: Cigarettes   Smokeless tobacco: Never  Vaping Use   Vaping status: Former  Substance and Sexual Activity   Alcohol use: Yes    Comment: rare   Drug use: No   Sexual activity: Not Currently    Birth control/protection: None  Other Topics Concern   Not on file  Social History Narrative   Not on file   Social Drivers of Health   Financial Resource Strain: Not on file  Food Insecurity: No Food Insecurity (02/05/2024)   Hunger Vital Sign    Worried About Running Out of Food in the Last Year: Never true    Ran Out of Food in the Last Year: Never true  Transportation Needs: No Transportation Needs (02/05/2024)   PRAPARE - Administrator, Civil Service (Medical): No    Lack of Transportation (Non-Medical): No  Physical Activity: Not on file  Stress: Not  on file  Social Connections: Patient Declined (01/31/2024)   Social Connection and Isolation Panel [NHANES]    Frequency of Communication with Friends and Family: Patient declined    Frequency of Social Gatherings with Friends and Family: Patient declined    Attends Religious Services: Patient declined    Database administrator or Organizations: Patient declined    Attends Banker Meetings: Patient declined    Marital Status: Patient declined  Intimate Partner Violence: Not At Risk (02/05/2024)   Humiliation, Afraid, Rape, and Kick questionnaire    Fear of Current or Ex-Partner: No    Emotionally Abused: No    Physically Abused: No    Sexually Abused: No    Family History: Family History  Problem Relation Age of Onset   Cancer Mother    Dementia Father    Colon cancer Neg Hx    Rectal cancer Neg Hx    Stomach cancer Neg Hx    Esophageal cancer Neg Hx     Current Medications:  Current Outpatient Medications:    alfuzosin (UROXATRAL) 10 MG 24 hr tablet, Take 10 mg by mouth daily., Disp: , Rfl:    allopurinol (ZYLOPRIM) 300 MG tablet, Take 1 tablet (300 mg total) by  mouth daily., Disp: 30 tablet, Rfl: 0   ALPRAZolam (XANAX) 0.5 MG tablet, Take 0.5 mg by mouth 2 (two) times daily., Disp: , Rfl:    apixaban (ELIQUIS) 5 MG TABS tablet, Take 1 tablet (5 mg total) by mouth 2 (two) times daily. Please start around August 31, 2020 after you complete the initial starter pack, Disp: 60 tablet, Rfl: 3   benzonatate (TESSALON) 200 MG capsule, Take 1 capsule (200 mg total) by mouth 3 (three) times daily. For cough, Disp: 20 capsule, Rfl: 0   Cholecalciferol (VITAMIN D3) 5000 units CAPS, Take 2 capsules by mouth daily. , Disp: , Rfl:    furosemide (LASIX) 40 MG tablet, Take 40 mg by mouth daily., Disp: , Rfl:    gabapentin (NEURONTIN) 600 MG tablet, Take 600 mg by mouth 3 (three) times daily., Disp: , Rfl:    guaiFENesin-dextromethorphan (ROBITUSSIN DM) 100-10 MG/5ML syrup, Take 10 mLs by mouth every 4 (four) hours as needed for cough (chest congestion)., Disp: , Rfl:    hydrocortisone (CORTEF) 10 MG tablet, TAKE 1 TABLET DAILY AT 8 A.M. AND 1 TABLET DAILY AT NOON, Disp: 180 tablet, Rfl: 1   insulin glargine (LANTUS) 100 UNIT/ML Solostar Pen, Inject 50 Units into the skin daily., Disp: , Rfl:    lidocaine-prilocaine (EMLA) cream, Apply topically as needed (Place small amount to port site 30 minutes prior to port access.). Apply to affected area once, Disp: 30 g, Rfl: 3   Multiple Vitamins-Minerals (MULTIVITAMIN PO), Take 1 tablet by mouth daily., Disp: , Rfl:    Omega-3 Fatty Acids (FISH OIL) 1200 MG CAPS, Take 1 capsule by mouth daily., Disp: , Rfl:    ondansetron (ZOFRAN) 8 MG tablet, Take 1 tablet (8 mg total) by mouth every 8 (eight) hours as needed for nausea or vomiting. Start on the second day after chemotherapy. Then take as needed for nausea or vomiting., Disp: 30 tablet, Rfl: 1   Oxycodone HCl 10 MG TABS, Take 10 mg by mouth 4 (four) times daily., Disp: , Rfl:    Palonosetron HCl (ALOXI IV), Inject into the vein., Disp: , Rfl:    pantoprazole (PROTONIX) 40 MG  tablet, Take 40 mg by mouth daily., Disp: , Rfl:  potassium chloride (KLOR-CON) 10 MEQ tablet, Take 10 mEq by mouth daily., Disp: , Rfl:    prochlorperazine (COMPAZINE) 10 MG tablet, Take 1 tablet (10 mg total) by mouth every 6 (six) hours as needed for nausea or vomiting., Disp: 30 tablet, Rfl: 1   rosuvastatin (CRESTOR) 10 MG tablet, Take 10 mg by mouth daily., Disp: , Rfl:    sulfamethoxazole-trimethoprim (BACTRIM DS) 800-160 MG tablet, Take 1 tablet by mouth 3 (three) times a week., Disp: 12 tablet, Rfl: 5   tirzepatide (MOUNJARO) 12.5 MG/0.5ML Pen, Inject 12.5 mg into the skin once a week., Disp: 6 mL, Rfl: 0   acyclovir (ZOVIRAX) 400 MG tablet, Take 1 tablet (400 mg total) by mouth daily., Disp: 30 tablet, Rfl: 5   Allergies: Allergies  Allergen Reactions   Prednisone Other (See Comments)    Unknown     REVIEW OF SYSTEMS:   Review of Systems  Constitutional:  Negative for chills, fatigue and fever.  HENT:   Negative for lump/mass, mouth sores, nosebleeds, sore throat and trouble swallowing.   Eyes:  Negative for eye problems.  Respiratory:  Negative for cough and shortness of breath.   Cardiovascular:  Negative for chest pain, leg swelling and palpitations.  Gastrointestinal:  Negative for abdominal pain, constipation, diarrhea, nausea and vomiting.  Genitourinary:  Negative for bladder incontinence, difficulty urinating, dysuria, frequency, hematuria and nocturia.   Musculoskeletal:  Negative for arthralgias, back pain, flank pain, myalgias and neck pain.  Skin:  Negative for itching and rash.  Neurological:  Negative for dizziness, headaches and numbness.  Hematological:  Does not bruise/bleed easily.  Psychiatric/Behavioral:  Negative for depression, sleep disturbance and suicidal ideas. The patient is not nervous/anxious.   All other systems reviewed and are negative.    VITALS:   Temperature (!) 97.2 F (36.2 C), temperature source Tympanic.  Wt Readings from Last 3  Encounters:  03/31/24 223 lb 5.2 oz (101.3 kg)  03/17/24 220 lb 14.4 oz (100.2 kg)  03/10/24 219 lb 5.7 oz (99.5 kg)    There is no height or weight on file to calculate BMI.  Performance status (ECOG): 1 - Symptomatic but completely ambulatory  PHYSICAL EXAM:   Physical Exam Vitals and nursing note reviewed. Exam conducted with a chaperone present.  Constitutional:      Appearance: Normal appearance.  Cardiovascular:     Rate and Rhythm: Normal rate and regular rhythm.     Pulses: Normal pulses.     Heart sounds: Normal heart sounds.  Pulmonary:     Effort: Pulmonary effort is normal.     Breath sounds: Normal breath sounds.  Abdominal:     Palpations: Abdomen is soft. There is no hepatomegaly, splenomegaly or mass.     Tenderness: There is no abdominal tenderness.  Musculoskeletal:     Right lower leg: No edema.     Left lower leg: No edema.  Lymphadenopathy:     Cervical: No cervical adenopathy.     Right cervical: No superficial, deep or posterior cervical adenopathy.    Left cervical: No superficial, deep or posterior cervical adenopathy.     Upper Body:     Right upper body: No supraclavicular or axillary adenopathy.     Left upper body: No supraclavicular or axillary adenopathy.  Neurological:     General: No focal deficit present.     Mental Status: He is alert and oriented to person, place, and time.  Psychiatric:        Mood and  Affect: Mood normal.        Behavior: Behavior normal.     LABS:   CBC     Component Value Date/Time   WBC 8.1 03/31/2024 0829   RBC 3.74 (L) 03/31/2024 0829   HGB 11.2 (L) 03/31/2024 0829   HCT 34.4 (L) 03/31/2024 0829   PLT 217 03/31/2024 0829   MCV 92.0 03/31/2024 0829   MCH 29.9 03/31/2024 0829   MCHC 32.6 03/31/2024 0829   RDW 14.9 03/31/2024 0829   LYMPHSABS 0.6 (L) 03/31/2024 0829   MONOABS 0.9 03/31/2024 0829   EOSABS 0.3 03/31/2024 0829   BASOSABS 0.1 03/31/2024 0829    CMP      Component Value Date/Time    NA 134 (L) 03/31/2024 0829   NA 143 01/22/2024 1307   K 4.5 03/31/2024 0829   CL 94 (L) 03/31/2024 0829   CO2 31 03/31/2024 0829   GLUCOSE 243 (H) 03/31/2024 0829   BUN 15 03/31/2024 0829   BUN 17 01/22/2024 1307   CREATININE 1.27 (H) 03/31/2024 0829   CALCIUM 8.9 03/31/2024 0829   PROT 6.0 (L) 03/31/2024 0829   PROT 7.0 01/22/2024 1307   ALBUMIN 3.2 (L) 03/31/2024 0829   ALBUMIN 4.5 01/22/2024 1307   AST 18 03/31/2024 0829   ALT 14 03/31/2024 0829   ALKPHOS 65 03/31/2024 0829   BILITOT 0.4 03/31/2024 0829   BILITOT 0.3 01/22/2024 1307   GFRNONAA >60 03/31/2024 0829   GFRAA 59 (L) 08/01/2020 0758     No results found for: "CEA1", "CEA" / No results found for: "CEA1", "CEA" No results found for: "PSA1" No results found for: "TKZ601" No results found for: "CAN125"  No results found for: "TOTALPROTELP", "ALBUMINELP", "A1GS", "A2GS", "BETS", "BETA2SER", "GAMS", "MSPIKE", "SPEI" No results found for: "TIBC", "FERRITIN", "IRONPCTSAT" Lab Results  Component Value Date   LDH 109 02/18/2024   LDH 116 01/15/2024     STUDIES:   No results found.

## 2024-03-31 ENCOUNTER — Inpatient Hospital Stay: Admitting: Dietician

## 2024-03-31 ENCOUNTER — Encounter: Payer: Self-pay | Admitting: Hematology

## 2024-03-31 ENCOUNTER — Inpatient Hospital Stay: Attending: Hematology

## 2024-03-31 ENCOUNTER — Inpatient Hospital Stay (HOSPITAL_BASED_OUTPATIENT_CLINIC_OR_DEPARTMENT_OTHER): Admitting: Hematology

## 2024-03-31 ENCOUNTER — Inpatient Hospital Stay

## 2024-03-31 ENCOUNTER — Inpatient Hospital Stay: Admitting: Hematology

## 2024-03-31 VITALS — BP 91/61 | HR 88 | Temp 97.7°F | Resp 18

## 2024-03-31 DIAGNOSIS — C8259 Diffuse follicle center lymphoma, extranodal and solid organ sites: Secondary | ICD-10-CM

## 2024-03-31 DIAGNOSIS — Z5112 Encounter for antineoplastic immunotherapy: Secondary | ICD-10-CM | POA: Diagnosis present

## 2024-03-31 DIAGNOSIS — C8299 Follicular lymphoma, unspecified, extranodal and solid organ sites: Secondary | ICD-10-CM | POA: Insufficient documentation

## 2024-03-31 DIAGNOSIS — Z5111 Encounter for antineoplastic chemotherapy: Secondary | ICD-10-CM | POA: Insufficient documentation

## 2024-03-31 LAB — COMPREHENSIVE METABOLIC PANEL WITH GFR
ALT: 14 U/L (ref 0–44)
AST: 18 U/L (ref 15–41)
Albumin: 3.2 g/dL — ABNORMAL LOW (ref 3.5–5.0)
Alkaline Phosphatase: 65 U/L (ref 38–126)
Anion gap: 9 (ref 5–15)
BUN: 15 mg/dL (ref 8–23)
CO2: 31 mmol/L (ref 22–32)
Calcium: 8.9 mg/dL (ref 8.9–10.3)
Chloride: 94 mmol/L — ABNORMAL LOW (ref 98–111)
Creatinine, Ser: 1.27 mg/dL — ABNORMAL HIGH (ref 0.61–1.24)
GFR, Estimated: >60 mL/min (ref >60)
Glucose, Bld: 243 mg/dL — ABNORMAL HIGH (ref 70–99)
Potassium: 4.5 mmol/L (ref 3.5–5.1)
Sodium: 134 mmol/L — ABNORMAL LOW (ref 135–145)
Total Bilirubin: 0.4 mg/dL (ref 0.0–1.2)
Total Protein: 6 g/dL — ABNORMAL LOW (ref 6.5–8.1)

## 2024-03-31 LAB — CBC WITH DIFFERENTIAL/PLATELET
Abs Immature Granulocytes: 0.04 10*3/uL (ref 0.00–0.07)
Basophils Absolute: 0.1 10*3/uL (ref 0.0–0.1)
Basophils Relative: 1 %
Eosinophils Absolute: 0.3 10*3/uL (ref 0.0–0.5)
Eosinophils Relative: 3 %
HCT: 34.4 % — ABNORMAL LOW (ref 39.0–52.0)
Hemoglobin: 11.2 g/dL — ABNORMAL LOW (ref 13.0–17.0)
Immature Granulocytes: 1 %
Lymphocytes Relative: 7 %
Lymphs Abs: 0.6 10*3/uL — ABNORMAL LOW (ref 0.7–4.0)
MCH: 29.9 pg (ref 26.0–34.0)
MCHC: 32.6 g/dL (ref 30.0–36.0)
MCV: 92 fL (ref 80.0–100.0)
Monocytes Absolute: 0.9 10*3/uL (ref 0.1–1.0)
Monocytes Relative: 12 %
Neutro Abs: 6.2 10*3/uL (ref 1.7–7.7)
Neutrophils Relative %: 76 %
Platelets: 217 10*3/uL (ref 150–400)
RBC: 3.74 MIL/uL — ABNORMAL LOW (ref 4.22–5.81)
RDW: 14.9 % (ref 11.5–15.5)
WBC: 8.1 10*3/uL (ref 4.0–10.5)
nRBC: 0 % (ref 0.0–0.2)

## 2024-03-31 LAB — URIC ACID: Uric Acid, Serum: 5.1 mg/dL (ref 3.7–8.6)

## 2024-03-31 LAB — MAGNESIUM: Magnesium: 2 mg/dL (ref 1.7–2.4)

## 2024-03-31 MED ORDER — FAMOTIDINE IN NACL 20-0.9 MG/50ML-% IV SOLN
20.0000 mg | Freq: Once | INTRAVENOUS | Status: AC
Start: 2024-03-31 — End: 2024-03-31
  Administered 2024-03-31: 20 mg via INTRAVENOUS
  Filled 2024-03-31: qty 50

## 2024-03-31 MED ORDER — HEPARIN SOD (PORK) LOCK FLUSH 100 UNIT/ML IV SOLN
500.0000 [IU] | Freq: Once | INTRAVENOUS | Status: AC | PRN
  Administered 2024-03-31: 500 [IU]

## 2024-03-31 MED ORDER — SODIUM CHLORIDE 0.9% FLUSH
10.0000 mL | INTRAVENOUS | Status: DC | PRN
Start: 2024-03-31 — End: 2024-03-31
  Administered 2024-03-31: 10 mL

## 2024-03-31 MED ORDER — ACYCLOVIR 400 MG PO TABS: 400.0000 mg | ORAL_TABLET | Freq: Every day | ORAL | 5 refills | Status: DC

## 2024-03-31 MED ORDER — LORAZEPAM 2 MG/ML IJ SOLN
0.5000 mg | Freq: Once | INTRAMUSCULAR | Status: AC
  Administered 2024-03-31: 0.5 mg via INTRAVENOUS
  Filled 2024-03-31: qty 1

## 2024-03-31 MED ORDER — PALONOSETRON HCL INJECTION 0.25 MG/5ML
0.2500 mg | Freq: Once | INTRAVENOUS | Status: AC
  Administered 2024-03-31: 0.25 mg via INTRAVENOUS
  Filled 2024-03-31: qty 5

## 2024-03-31 MED ORDER — SODIUM CHLORIDE 0.9 % IV SOLN
70.0000 mg/m2 | Freq: Once | INTRAVENOUS | Status: AC
  Administered 2024-03-31: 150 mg via INTRAVENOUS
  Filled 2024-03-31: qty 6

## 2024-03-31 MED ORDER — ACETAMINOPHEN 325 MG PO TABS
650.0000 mg | ORAL_TABLET | Freq: Once | ORAL | Status: AC
  Administered 2024-03-31: 650 mg via ORAL
  Filled 2024-03-31: qty 2

## 2024-03-31 MED ORDER — SODIUM CHLORIDE 0.9 % IV SOLN: INTRAVENOUS | Status: DC

## 2024-03-31 MED ORDER — CETIRIZINE HCL 10 MG/ML IV SOLN
10.0000 mg | Freq: Once | INTRAVENOUS | Status: AC
  Administered 2024-03-31: 10 mg via INTRAVENOUS
  Filled 2024-03-31: qty 1

## 2024-03-31 MED ORDER — SODIUM CHLORIDE 0.9 % IV SOLN
20.0000 mg | Freq: Once | INTRAVENOUS | Status: AC
  Administered 2024-03-31: 20 mg via INTRAVENOUS
  Filled 2024-03-31: qty 20

## 2024-03-31 MED ORDER — SODIUM CHLORIDE 0.9 % IV SOLN
1000.0000 mg | Freq: Once | INTRAVENOUS | Status: AC
  Administered 2024-03-31: 1000 mg via INTRAVENOUS
  Filled 2024-03-31: qty 40

## 2024-03-31 NOTE — Patient Instructions (Signed)

## 2024-03-31 NOTE — Progress Notes (Signed)
 Patient presents today for Gazyva/Bendeka infusion. Patient is in satisfactory condition with no new complaints voiced.  Vital signs are stable.  Labs reviewed by Dr. Cheree Cords during the office visit and all labs are within treatment parameters. Patient's blood sugar noted to be 243 today. Patient stated he did not want the 10 units of Novolog per Dr.Katragadda's standing orders. Patient took 50 units of insulin at home prior to arrival which he normally takes daily. We will proceed with treatment per MD orders.   On patient's 2nd increase of Gazyva, patient's  O2 dropped to 86%, RN placed him on 2L O2 which increased to 94%. Patient denies any shortness but wheezing is noted while patient is sleeping. Dr.Katragadda made aware. Patient ambulated to the bathroom and O2 was checked  without the oxygen, and oxygen levels remained 89%-90%. After infusion was completed patient's O2 noted to be 92% without oxygen. Provider made aware and stated  patient is okay to be discharged home advised patient to report to the ER if any shortness of breath or wheezing gets worse. Patient made aware and verbalized understanding.  Treatment given today per MD orders. Tolerated infusion without adverse affects. Vital signs stable. No complaints at this time. Discharged from clinic ambulatory in stable condition. Alert and oriented x 3. F/U with [facility] as scheduled.

## 2024-03-31 NOTE — Progress Notes (Signed)
 Nutrition Follow-up:  Pt with follicular lymphoma. He is receiving induction chemoimmunotherapy with bendamustine + obintuzumab q28d. Patient is under the care of Dr. Cheree Cords.   Met with patient in infusion. Patient reports appetite is too good. Eating 2- 2.5 meals/day. Patient is sleepy during visit. Does not provide recall today. He is drinking 3 Pedialyte as well as 3-4 bottles of water. He denies nausea, vomiting, diarrhea, constipation.  Medications: reviewed   Labs: Na 134, glucose 243, Cr 1.27, albumin 3.2  Anthropometrics: Wt 223 lb 5.2 oz today increased  3/31 - 220 lb 14.4 oz 2/17 - 221 lb 6.4 oz    NUTRITION DIAGNOSIS: Food and nutrition related knowledge deficit improving    INTERVENTION:  Encourage good sources of lean proteins at every meal    MONITORING, EVALUATION, GOAL: wt trends, intake   NEXT VISIT: To be scheduled as needed

## 2024-03-31 NOTE — Progress Notes (Signed)
 Confirmed Bendeka at 70 mg/m2 - updated dose in plan.  V.O. Dr Davina Ester, PharmD

## 2024-03-31 NOTE — Progress Notes (Signed)
Patient has been examined by Dr. Ellin Saba. Vital signs (HR 113) and labs have been reviewed by MD - ANC, Creatinine, LFTs, hemoglobin, and platelets are within treatment parameters per M.D. - pt may proceed with treatment.  Primary RN and pharmacy notified.

## 2024-03-31 NOTE — Patient Instructions (Signed)
 [facility] - A DEPT OF [facility]  Discharge Instructions: Thank you for choosing [facility] to provide your oncology and hematology care.  If you have a lab appointment with the Cancer Center - please note that after April 8th, 2024, all labs will be drawn in the cancer center.  You do not have to check in or register with the main entrance as you have in the past but will complete your check-in in the cancer center.  Wear comfortable clothing and clothing appropriate for easy access to any Portacath or PICC line.   We strive to give you quality time with your provider. You may need to reschedule your appointment if you arrive late (15 or more minutes).  Arriving late affects you and other patients whose appointments are after yours.  Also, if you miss three or more appointments without notifying the office, you may be dismissed from the clinic at the provider's discretion.      For prescription refill requests, have your pharmacy contact our office and allow 72 hours for refills to be completed.    Today you received the following chemotherapy and/or immunotherapy agents Gazyva and Bendeka   To help prevent nausea and vomiting after your treatment, we encourage you to take your nausea medication as directed.    BELOW ARE SYMPTOMS THAT SHOULD BE REPORTED IMMEDIATELY: *FEVER GREATER THAN 100.4 F (38 C) OR HIGHER *CHILLS OR SWEATING *NAUSEA AND VOMITING THAT IS NOT CONTROLLED WITH YOUR NAUSEA MEDICATION *UNUSUAL SHORTNESS OF BREATH *UNUSUAL BRUISING OR BLEEDING *URINARY PROBLEMS (pain or burning when urinating, or frequent urination) *BOWEL PROBLEMS (unusual diarrhea, constipation, pain near the anus) TENDERNESS IN MOUTH AND THROAT WITH OR WITHOUT PRESENCE OF ULCERS (sore throat, sores in mouth, or a toothache) UNUSUAL RASH, SWELLING OR PAIN  UNUSUAL VAGINAL DISCHARGE OR ITCHING   Items with * indicate a potential emergency and should be  followed up as soon as possible or go to the Emergency Department if any problems should occur.  Please show the CHEMOTHERAPY ALERT CARD or IMMUNOTHERAPY ALERT CARD at check-in to the Emergency Department and triage nurse.  Should you have questions after your visit or need to cancel or reschedule your appointment, please contact [facility] [place] - A DEPT OF [facility] [phone]  and follow the prompts.  Office hours are 8:00 a.m. to 4:30 p.m. Monday - Friday. Please note that voicemails left after 4:00 p.m. may not be returned until the following business day.  We are closed weekends and major holidays. You have access to a nurse at all times for urgent questions. Please call the main number to the clinic [phone] and follow the prompts.  For any non-urgent questions, you may also contact your provider using MyChart. We now offer e-Visits for anyone 54 and older to request care online for non-urgent symptoms. For details visit [email or web address].   Also download the MyChart app! Go to the app store, search "MyChart", open the app, select [place], and log in with your MyChart username and password.

## 2024-04-01 ENCOUNTER — Other Ambulatory Visit: Payer: Self-pay | Admitting: *Deleted

## 2024-04-01 ENCOUNTER — Inpatient Hospital Stay

## 2024-04-01 VITALS — BP 111/69 | HR 89 | Temp 96.2°F | Resp 20

## 2024-04-01 DIAGNOSIS — C8259 Diffuse follicle center lymphoma, extranodal and solid organ sites: Secondary | ICD-10-CM

## 2024-04-01 DIAGNOSIS — Z5112 Encounter for antineoplastic immunotherapy: Secondary | ICD-10-CM | POA: Diagnosis not present

## 2024-04-01 LAB — HEPATITIS B CORE ANTIBODY, TOTAL: HEP B CORE AB: NEGATIVE

## 2024-04-01 MED ORDER — HEPARIN SOD (PORK) LOCK FLUSH 100 UNIT/ML IV SOLN
500.0000 [IU] | Freq: Once | INTRAVENOUS | Status: AC | PRN
  Administered 2024-04-01: 500 [IU]

## 2024-04-01 MED ORDER — DEXAMETHASONE SODIUM PHOSPHATE 10 MG/ML IJ SOLN
10.0000 mg | Freq: Once | INTRAMUSCULAR | Status: AC
  Administered 2024-04-01: 10 mg via INTRAVENOUS
  Filled 2024-04-01: qty 1

## 2024-04-01 MED ORDER — SODIUM CHLORIDE 0.9% FLUSH
10.0000 mL | INTRAVENOUS | Status: DC | PRN
  Administered 2024-04-01: 10 mL

## 2024-04-01 MED ORDER — SODIUM CHLORIDE 0.9 % IV SOLN
70.0000 mg/m2 | Freq: Once | INTRAVENOUS | Status: AC
  Administered 2024-04-01: 150 mg via INTRAVENOUS
  Filled 2024-04-01: qty 6

## 2024-04-01 MED ORDER — SULFAMETHOXAZOLE-TRIMETHOPRIM 800-160 MG PO TABS: 1.0000 | ORAL_TABLET | ORAL | 5 refills | Status: DC

## 2024-04-01 MED ORDER — SODIUM CHLORIDE 0.9 % IV SOLN
INTRAVENOUS | Status: DC
Start: 2024-04-01 — End: 2024-04-01

## 2024-04-01 MED ORDER — ACYCLOVIR 400 MG PO TABS: 400.0000 mg | ORAL_TABLET | Freq: Every day | ORAL | 5 refills | Status: DC

## 2024-04-01 MED ORDER — ALLOPURINOL 300 MG PO TABS
300.0000 mg | ORAL_TABLET | Freq: Every day | ORAL | 0 refills | Status: DC
Start: 2024-04-01 — End: 2024-07-07

## 2024-04-01 NOTE — Progress Notes (Signed)
Patient tolerated chemotherapy with no complaints voiced. Side effects with management reviewed understanding verbalized. Port site clean and dry with no bruising or swelling noted at site. Good blood return noted before and after administration of chemotherapy. Band aid applied. Patient left in satisfactory condition with VSS and no s/s of distress noted. 

## 2024-04-01 NOTE — Patient Instructions (Signed)
 [facility] [place] - A DEPT OF [facility]  Discharge Instructions: Thank you for choosing [facility] to provide your oncology and hematology care.  If you have a lab appointment with the Cancer Center - please note that after April 8th, 2024, all labs will be drawn in the cancer center.  You do not have to check in or register with the main entrance as you have in the past but will complete your check-in in the cancer center.  Wear comfortable clothing and clothing appropriate for easy access to any Portacath or PICC line.   We strive to give you quality time with your provider. You may need to reschedule your appointment if you arrive late (15 or more minutes).  Arriving late affects you and other patients whose appointments are after yours.  Also, if you miss three or more appointments without notifying the office, you may be dismissed from the clinic at the provider's discretion.      For prescription refill requests, have your pharmacy contact our office and allow 72 hours for refills to be completed.    Today you received the following chemotherapy and/or immunotherapy agents Bendamustine, return as scheduled.   To help prevent nausea and vomiting after your treatment, we encourage you to take your nausea medication as directed.  BELOW ARE SYMPTOMS THAT SHOULD BE REPORTED IMMEDIATELY: *FEVER GREATER THAN 100.4 F (38 C) OR HIGHER *CHILLS OR SWEATING *NAUSEA AND VOMITING THAT IS NOT CONTROLLED WITH YOUR NAUSEA MEDICATION *UNUSUAL SHORTNESS OF BREATH *UNUSUAL BRUISING OR BLEEDING *URINARY PROBLEMS (pain or burning when urinating, or frequent urination) *BOWEL PROBLEMS (unusual diarrhea, constipation, pain near the anus) TENDERNESS IN MOUTH AND THROAT WITH OR WITHOUT PRESENCE OF ULCERS (sore throat, sores in mouth, or a toothache) UNUSUAL RASH, SWELLING OR PAIN  UNUSUAL VAGINAL DISCHARGE OR ITCHING   Items with * indicate a potential emergency and  should be followed up as soon as possible or go to the Emergency Department if any problems should occur.  Please show the CHEMOTHERAPY ALERT CARD or IMMUNOTHERAPY ALERT CARD at check-in to the Emergency Department and triage nurse.  Should you have questions after your visit or need to cancel or reschedule your appointment, please contact [facility] [place] - A DEPT OF [facility] [phone]  and follow the prompts.  Office hours are 8:00 a.m. to 4:30 p.m. Monday - Friday. Please note that voicemails left after 4:00 p.m. may not be returned until the following business day.  We are closed weekends and major holidays. You have access to a nurse at all times for urgent questions. Please call the main number to the clinic [phone] and follow the prompts.  For any non-urgent questions, you may also contact your provider using MyChart. We now offer e-Visits for anyone 71 and older to request care online for non-urgent symptoms. For details visit [email or web address].   Also download the MyChart app! Go to the app store, search "MyChart", open the app, select [place], and log in with your MyChart username and password.

## 2024-04-10 ENCOUNTER — Telehealth: Payer: Self-pay | Admitting: *Deleted

## 2024-04-10 ENCOUNTER — Ambulatory Visit (HOSPITAL_COMMUNITY)
Admission: RE | Admit: 2024-04-10 | Discharge: 2024-04-10 | Disposition: A | Discharge status: Home / Self Care | Source: Ambulatory Visit | Attending: Nurse Practitioner | Admitting: Nurse Practitioner

## 2024-04-10 ENCOUNTER — Other Ambulatory Visit (HOSPITAL_COMMUNITY): Payer: Self-pay | Admitting: Nurse Practitioner

## 2024-04-10 ENCOUNTER — Emergency Department (HOSPITAL_COMMUNITY)
Admission: EM | Admit: 2024-04-10 | Discharge: 2024-04-10 | Disposition: A | Discharge status: Home / Self Care | Attending: Emergency Medicine | Admitting: Emergency Medicine

## 2024-04-10 ENCOUNTER — Other Ambulatory Visit: Payer: Self-pay

## 2024-04-10 ENCOUNTER — Encounter (HOSPITAL_COMMUNITY): Payer: Self-pay

## 2024-04-10 DIAGNOSIS — R509 Fever, unspecified: Secondary | ICD-10-CM | POA: Insufficient documentation

## 2024-04-10 DIAGNOSIS — Z794 Long term (current) use of insulin: Secondary | ICD-10-CM | POA: Insufficient documentation

## 2024-04-10 DIAGNOSIS — R052 Subacute cough: Secondary | ICD-10-CM | POA: Diagnosis not present

## 2024-04-10 DIAGNOSIS — R059 Cough, unspecified: Secondary | ICD-10-CM | POA: Diagnosis present

## 2024-04-10 DIAGNOSIS — Z7901 Long term (current) use of anticoagulants: Secondary | ICD-10-CM | POA: Diagnosis not present

## 2024-04-10 DIAGNOSIS — R0602 Shortness of breath: Secondary | ICD-10-CM

## 2024-04-10 HISTORY — DX: Malignant (primary) neoplasm, unspecified: C80.1

## 2024-04-10 LAB — URINALYSIS, W/ REFLEX TO CULTURE (INFECTION SUSPECTED)
Bacteria, UA: NONE SEEN
Bilirubin Urine: NEGATIVE
Glucose, UA: 150 mg/dL — AB
Hgb urine dipstick: NEGATIVE
Ketones, ur: NEGATIVE mg/dL
Leukocytes,Ua: NEGATIVE
Nitrite: NEGATIVE
Protein, ur: NEGATIVE mg/dL
Specific Gravity, Urine: 1.014 (ref 1.005–1.030)
pH: 7 (ref 5.0–8.0)

## 2024-04-10 LAB — CBC WITH DIFFERENTIAL/PLATELET
Abs Immature Granulocytes: 0.14 10*3/uL — ABNORMAL HIGH (ref 0.00–0.07)
Basophils Absolute: 0.1 10*3/uL (ref 0.0–0.1)
Basophils Relative: 1 %
Eosinophils Absolute: 0.1 10*3/uL (ref 0.0–0.5)
Eosinophils Relative: 1 %
HCT: 36.8 % — ABNORMAL LOW (ref 39.0–52.0)
Hemoglobin: 12 g/dL — ABNORMAL LOW (ref 13.0–17.0)
Immature Granulocytes: 2 %
Lymphocytes Relative: 4 %
Lymphs Abs: 0.3 10*3/uL — ABNORMAL LOW (ref 0.7–4.0)
MCH: 30.2 pg (ref 26.0–34.0)
MCHC: 32.6 g/dL (ref 30.0–36.0)
MCV: 92.5 fL (ref 80.0–100.0)
Monocytes Absolute: 1 10*3/uL (ref 0.1–1.0)
Monocytes Relative: 13 %
Neutro Abs: 6.5 10*3/uL (ref 1.7–7.7)
Neutrophils Relative %: 79 %
Platelets: 211 10*3/uL (ref 150–400)
RBC: 3.98 MIL/uL — ABNORMAL LOW (ref 4.22–5.81)
RDW: 15.1 % (ref 11.5–15.5)
WBC: 8.1 10*3/uL (ref 4.0–10.5)
nRBC: 0 % (ref 0.0–0.2)

## 2024-04-10 LAB — COMPREHENSIVE METABOLIC PANEL WITH GFR
ALT: 14 U/L (ref 0–44)
AST: 18 U/L (ref 15–41)
Albumin: 3.6 g/dL (ref 3.5–5.0)
Alkaline Phosphatase: 66 U/L (ref 38–126)
Anion gap: 10 (ref 5–15)
BUN: 19 mg/dL (ref 8–23)
CO2: 27 mmol/L (ref 22–32)
Calcium: 9.3 mg/dL (ref 8.9–10.3)
Chloride: 100 mmol/L (ref 98–111)
Creatinine, Ser: 1.58 mg/dL — ABNORMAL HIGH (ref 0.61–1.24)
GFR, Estimated: 47 mL/min — ABNORMAL LOW (ref >60)
Glucose, Bld: 214 mg/dL — ABNORMAL HIGH (ref 70–99)
Potassium: 4.6 mmol/L (ref 3.5–5.1)
Sodium: 137 mmol/L (ref 135–145)
Total Bilirubin: 0.6 mg/dL (ref 0.0–1.2)
Total Protein: 6.9 g/dL (ref 6.5–8.1)

## 2024-04-10 LAB — PROTIME-INR
INR: 1.1 (ref 0.8–1.2)
Prothrombin Time: 14.7 s (ref 11.4–15.2)

## 2024-04-10 LAB — LACTIC ACID, PLASMA
Lactic Acid, Venous: 1.8 mmol/L (ref 0.5–1.9)
Lactic Acid, Venous: 1.4 mmol/L (ref 0.5–1.9)

## 2024-04-10 LAB — RESP PANEL BY RT-PCR (RSV, FLU A&B, COVID)  RVPGX2
Influenza A by PCR: NEGATIVE
Influenza B by PCR: NEGATIVE
Resp Syncytial Virus by PCR: NEGATIVE
SARS Coronavirus 2 by RT PCR: NEGATIVE

## 2024-04-10 MED ORDER — SODIUM CHLORIDE 0.9 % IV SOLN
2.0000 g | Freq: Once | INTRAVENOUS | Status: AC
  Administered 2024-04-10: 2 g via INTRAVENOUS
  Filled 2024-04-10: qty 20

## 2024-04-10 MED ORDER — ACETAMINOPHEN 325 MG PO TABS
650.0000 mg | ORAL_TABLET | Freq: Once | ORAL | Status: AC
  Administered 2024-04-10: 650 mg via ORAL
  Filled 2024-04-10: qty 2

## 2024-04-10 MED ORDER — SODIUM CHLORIDE 0.9 % IV SOLN
500.0000 mg | Freq: Once | INTRAVENOUS | Status: AC
  Administered 2024-04-10: 500 mg via INTRAVENOUS
  Filled 2024-04-10: qty 5

## 2024-04-10 MED ORDER — SODIUM CHLORIDE 0.9 % IV BOLUS
1000.0000 mL | Freq: Once | INTRAVENOUS | Status: AC
  Administered 2024-04-10: 1000 mL via INTRAVENOUS

## 2024-04-10 NOTE — ED Provider Notes (Signed)
 [place] EMERGENCY DEPARTMENT AT [facility] Provider Note   CSN: [id number] Arrival date & time: 04/10/24  1709     History {Add pertinent medical, surgical, social history, OB history to HPI:1} Chief Complaint  Patient presents with   Fever    Shawn Fleming is a 68 y.o. male.  Patient states he has had a cough for last couple weeks that seems to be getting better.  Patient was seen by the family doctor who stated his temperature was 101.  The family doctor called Dr. Katragadda and Dr. Cheree Cords felt like the patient should be examined in the emergency department   Fever      Home Medications Prior to Admission medications   Medication Sig Start Date End Date Taking? Authorizing Provider  acyclovir  (ZOVIRAX ) 400 MG tablet Take 1 tablet (400 mg total) by mouth daily. 04/01/24  Yes Paulett Boros, MD  alfuzosin  (UROXATRAL ) 10 MG 24 hr tablet Take 10 mg by mouth daily. 03/18/24  Yes [provider]  allopurinol  (ZYLOPRIM ) 300 MG tablet Take 1 tablet (300 mg total) by mouth daily. 04/01/24  Yes Paulett Boros, MD  ALPRAZolam  (XANAX ) 0.5 MG tablet Take 0.5 mg by mouth 2 (two) times daily.   Yes [provider]  apixaban  (ELIQUIS ) 5 MG TABS tablet Take 1 tablet (5 mg total) by mouth 2 (two) times daily. Please start around August 31, 2020 after you complete the initial starter pack 08/31/20  Yes Emokpae, Courage, MD  Cholecalciferol (VITAMIN D3) 5000 units CAPS Take 2 capsules by mouth daily.    Yes [provider]  furosemide (LASIX) 40 MG tablet Take 40 mg by mouth daily. 07/27/22  Yes [provider]  gabapentin (NEURONTIN) 600 MG tablet Take 600 mg by mouth 3 (three) times daily. 01/02/24  Yes [provider]  hydrocortisone  (CORTEF ) 10 MG tablet TAKE 1 TABLET DAILY AT 8 A.M. AND 1 TABLET DAILY AT NOON 01/17/24  Yes Nida, Gebreselassie W, MD  insulin  glargine (LANTUS ) 100 UNIT/ML Solostar Pen Inject 50 Units into  the skin daily.   Yes [provider]  lidocaine -prilocaine  (EMLA ) cream Apply topically as needed (Place small amount to port site 30 minutes prior to port access.). Apply to affected area once 02/25/24  Yes Paulett Boros, MD  Multiple Vitamins-Minerals (MULTIVITAMIN PO) Take 1 tablet by mouth daily.   Yes [provider]  Omega-3 Fatty Acids (FISH OIL) 1200 MG CAPS Take 1 capsule by mouth daily.   Yes [provider]  ondansetron  (ZOFRAN ) 8 MG tablet Take 1 tablet (8 mg total) by mouth every 8 (eight) hours as needed for nausea or vomiting. Start on the second day after chemotherapy. Then take as needed for nausea or vomiting. 02/25/24  Yes Paulett Boros, MD  Oxycodone  HCl 10 MG TABS Take 10 mg by mouth 4 (four) times daily. 07/05/20  Yes [provider]  pantoprazole  (PROTONIX ) 40 MG tablet Take 40 mg by mouth daily. 03/21/20  Yes [provider]  potassium chloride  (KLOR-CON ) 10 MEQ tablet Take 10 mEq by mouth daily. 07/11/22  Yes [provider]  prochlorperazine  (COMPAZINE ) 10 MG tablet Take 1 tablet (10 mg total) by mouth every 6 (six) hours as needed for nausea or vomiting. 02/25/24  Yes Paulett Boros, MD  rosuvastatin  (CRESTOR ) 10 MG tablet Take 10 mg by mouth daily.   Yes [provider]  sulfamethoxazole -trimethoprim  (BACTRIM  DS) 800-160 MG tablet Take 1 tablet by mouth 3 (three) times a week. 04/02/24  Yes Paulett Boros, MD  tirzepatide  (MOUNJARO ) 12.5 MG/0.5ML Pen Inject 12.5 mg into the skin once a week. 07/23/23  Yes       Allergies    Prednisone     Review of Systems   Review of Systems  Constitutional:  Positive for fever.    Physical Exam Updated Vital Signs BP 102/75   Pulse 86   Temp 98.6 F (37 C) (Oral)   Resp 16   Ht 5\' 5"  (1.651 m)   Wt 98.4 kg   SpO2 94%   BMI 36.11 kg/m  Physical Exam  ED Results / Procedures / Treatments   Labs (all labs ordered are listed, but only  abnormal results are displayed) Labs Reviewed  COMPREHENSIVE METABOLIC PANEL WITH GFR - Abnormal; Notable for the following components:      Result Value   Glucose, Bld 214 (*)    Creatinine, Ser 1.58 (*)    GFR, Estimated 47 (*)    All other components within normal limits  CBC WITH DIFFERENTIAL/PLATELET - Abnormal; Notable for the following components:   RBC 3.98 (*)    Hemoglobin 12.0 (*)    HCT 36.8 (*)    Lymphs Abs 0.3 (*)    Abs Immature Granulocytes 0.14 (*)    All other components within normal limits  URINALYSIS, W/ REFLEX TO CULTURE (INFECTION SUSPECTED) - Abnormal; Notable for the following components:   Glucose, UA 150 (*)    All other components within normal limits  RESP PANEL BY RT-PCR (RSV, FLU A&B, COVID)  RVPGX2  CULTURE, BLOOD (ROUTINE X 2)  CULTURE, BLOOD (ROUTINE X 2)  LACTIC ACID, PLASMA  LACTIC ACID, PLASMA  PROTIME-INR    EKG None  Radiology DG Chest 2 View Result Date: 04/10/2024 CLINICAL DATA:  Shortness of breath EXAM: CHEST - 2 VIEW COMPARISON:  Chest x-ray 01/30/2024 FINDINGS: Right chest port catheter tip ends in the SVC. The heart size and mediastinal contours are within normal limits. Both lungs are clear. The visualized skeletal structures are unremarkable. IMPRESSION: No active cardiopulmonary disease. Electronically Signed   By: Tyron Gallon M.D.   On: 04/10/2024 17:44    Procedures Procedures  {Document cardiac monitor, telemetry assessment procedure when appropriate:1}  Medications Ordered in ED Medications  cefTRIAXone  (ROCEPHIN ) 2 g in sodium chloride  0.9 % 100 mL IVPB (0 g Intravenous Stopped 04/10/24 1922)  azithromycin  (ZITHROMAX ) 500 mg in sodium chloride  0.9 % 250 mL IVPB (0 mg Intravenous Stopped 04/10/24 2109)  sodium chloride  0.9 % bolus 1,000 mL (0 mLs Intravenous Stopped 04/10/24 2109)  acetaminophen  (TYLENOL ) tablet 650 mg (650 mg Oral Given 04/10/24 1949)    ED Course/ Medical Decision Making/ A&P   {   Click here for  ABCD2, HEART and other calculatorsREFRESH Note before signing :1}                              Medical Decision Making Amount and/or Complexity of Data Reviewed Labs: ordered.  Risk OTC drugs.   Labs and chest x-ray were unremarkable.  I spoke with Dr. Orvis Blare and she stated the patient can follow-up as an outpatient  {Document critical care time when appropriate:1} {Document review of labs and clinical decision tools ie heart score, Chads2Vasc2 etc:1}  {Document your independent review of radiology images, and any outside records:1} {Document your discussion with family members, caretakers, and with consultants:1} {Document social determinants of health affecting pt's care:1} {Document your decision making  why or why not admission, treatments were needed:1} Final Clinical Impression(s) / ED Diagnoses Final diagnoses:  Subacute cough    Rx / DC Orders ED Discharge Orders     None

## 2024-04-10 NOTE — Progress Notes (Signed)
 Elink is following code sepsis.

## 2024-04-10 NOTE — ED Triage Notes (Signed)
 Pt feverish, SOB, coughing. Pt is a cancer pt, Dr. Linnell Richardson recommended the ED

## 2024-04-10 NOTE — Discharge Instructions (Signed)
 Follow-up with your family doctor next week.  If you develop fevers and chills come back for recheck

## 2024-04-10 NOTE — Telephone Encounter (Addendum)
 Received a call from Dr. Quentin Brunner office advising that patient was in their office with SOB, productive cough and low grade fever.  Breathing treatment given in office and labs done.  Patient sent for chest x ray.  Per Dr. Katragadda, patient was contacted.  He does not have a thermometer at home, however is on his way to get one.  Advised him to keep a check on his temp and to report to the ER if >100.5.  Verbalized understanding.  States he feels some better since breathing treatment.  Will follow up on lab work and touch base with patient tomorrow morning.

## 2024-04-10 NOTE — Progress Notes (Signed)
 CODE SEPSIS - PHARMACY COMMUNICATION  **Broad Spectrum Antibiotics should be administered within 1 hour of Sepsis diagnosis**  Time Code Sepsis Called/Page Received: 1733  Antibiotics Ordered: Ceftriaxone  & azithromycin   Time of 1st antibiotic administration: 1820  Additional action taken by pharmacy: N/A  Page Boast 04/10/2024  5:36 PM

## 2024-04-15 LAB — CULTURE, BLOOD (ROUTINE X 2)
Culture: NO GROWTH
Culture: NO GROWTH
Special Requests: ADEQUATE
Special Requests: ADEQUATE

## 2024-04-27 NOTE — Progress Notes (Signed)
 [facility] [address] [place] [address]    Clinic Day:  04/28/2024  Referring physician: Omie Bickers, MD  Patient Care Team: Omie Bickers, MD as PCP - General (Internal Medicine) Shawn Boros, MD as Medical Oncologist (Medical Oncology) Gerhard Knuckles, RN as Oncology Nurse Navigator (Medical Oncology)   ASSESSMENT & PLAN:   Assessment: 1.  Left facial soft tissue mass: - Left facial mass for the last 4-5 months, gradually increasing.  No hearing loss/vision changes. - No fevers/night sweats.  57 pound weight loss in the last 2 years, has been on Mounjaro  for the last 1 and half year. - Ultrasound (11/30/2023): Complex 3.6 x 1.1 x 2.9 cm cystic structure in front of the left ear. - CT head (12/27/2022): Spiculated soft tissue mass around the left zygomatic arch measuring 2.2 cm in thickness and 4.3 cm in length.  Multifocal lobulated and infiltrative appearing soft tissue along the deep posterior left scalp convexity might be in part abnormal left level 5 lymph nodes. - PET scan (01/23/2024): Hypermetabolic enlargement of the left muscle of mastication just beneath the zygomatic arch with hypermetabolic left level 2, level 3, left posterior triangle lymph nodes.  Less well-defined hypermetabolic tissue within the right parotid gland.  Moderate hypermetabolic inguinal nodes with normal morphology are indeterminate.  1.8 x 1.4 spiculated left hilar mass encasing the left lower lobe superior segmental bronchus at its origin. - Left facial mass biopsy (02/07/2024): Follicular lymphoma, favoring classic type.  Proliferative rate by Ki-67 is low. - FLIPI score: 2 (age> 60, stage III/IV), intermediate high risk with 2-year OS 94%, 5-year OS 78% and 10-year OS 51%. - Cycle 1 Bendamustine  and obinutuzumab  (GALLIUM trial) started on 03/03/2024   2. 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 Quit smoking 10-12  years ago of 0.5 to 0.75 ppd since age 71. Asbestos exposure in the [employer]. AFFF exposure. -Mother died of lymphoma. No other family history of cancer. No family history of blood clots.    Plan: 1.  Stage IVB classical follicular lymphoma: - He received cycle 2 on 03/31/2024. - He was evaluated on 04/10/2024 in the ER with fever.  I have reviewed blood cultures which did not grow any organism.  Respiratory panel was negative.  UA was normal.  Chest x-ray was normal. - Labs today: Normal LFTs.  Creatinine 1.57 and stable.  Platelet count is mildly low at 112.  White count is normal. - He may proceed with cycle 3 today at Bendamustine  70 mg/m.  Will add long-acting G-CSF on day 3 because of recent fever. - Blood pressure is 89/61.  He reports occasional dizziness.  He has fallen at home about 3 times in the last 3 months.  He is not on any blood pressure medications.  He will receive 1 L fluid today and half liter tomorrow.  If this continues to be a problem, will consider starting him on midodrine.   2.  ID prophylaxis: - Continue Bactrim  on Monday Wednesday and Friday and acyclovir  twice daily.   3.  Left hilar mass, PET positive: - We will follow-up on repeat PET scan after 3 cycles.  If it is still positive will consider bronchoscopy and biopsy.  We think it is from lymphoma at this point.   4.  TLS prophylaxis: - Uric acid today is 4.  Continue allopurinol  daily until he finishes the bottle on 05/01/2024.  No orders of the defined types were placed in this encounter.     I,Shawn Fleming,acting as a Neurosurgeon for Shawn Boros, MD.,have documented all relevant documentation on the behalf of Shawn Boros, MD,as directed by  Shawn Boros, MD while in the presence of Shawn Boros, MD.   I, Shawn Boros MD, have reviewed the above documentation for accuracy and completeness, and I agree with the above.   Shawn Boros, MD   5/12/20259:20  AM  CHIEF COMPLAINT:   Diagnosis: follicular lymphoma    Cancer Staging  Follicular lymphoma (HCC) Staging form: Hodgkin and Non-Hodgkin Lymphoma, AJCC 8th Edition - Clinical stage from 02/18/2024: Stage IV (Follicular lymphoma) - Unsigned    Prior Therapy: none  Current Therapy:  Bendamustine  and obinutuzumab  (GALLIUM trial)    HISTORY OF PRESENT ILLNESS:   Oncology History  Follicular lymphoma (HCC)  02/18/2024 Initial Diagnosis   Follicular lymphoma (HCC)   03/03/2024 -  Chemotherapy   Patient is on Treatment Plan : NON-HODGKIN'S LYMPHOMA - FOLLICULAR INDUCTION Obinutuzumab  +  Bendamustine  q28d        INTERVAL HISTORY:   Shawn Fleming is a 68 y.o. male presenting to clinic today for follow up of follicular lymphoma. He was last seen by me on 03/31/24.  Today, he states that he is doing well overall. His appetite level is at 100%. His energy level is at 50%.  PAST MEDICAL HISTORY:   Past Medical History: Past Medical History:  Diagnosis Date   AKI (acute kidney injury) (HCC) 07/29/2020   Arthritis    Cancer (HCC)    Diabetes (HCC)    GERD (gastroesophageal reflux disease)    History of kidney stones    Hypercholesteremia    Hypertension    Migraine    Sleep apnea     Surgical History: Past Surgical History:  Procedure Laterality Date   ADENOIDECTOMY     APPENDECTOMY     CENTRAL VENOUS CATHETER INSERTION Left 04/20/2013   Procedure: INSERTION CENTRAL LINE LEFT SUBCLAVIAN;  Surgeon: Lovena Rubinstein, MD;  Location: [facility];  Service: General;  Laterality: Left;   INCISION AND DRAINAGE PERIRECTAL ABSCESS N/A 04/20/2013   Procedure: SHARP SURGICAL DEBRIDEMENT PERINEAL INFECTION;  Surgeon: Lovena Rubinstein, MD;  Location: [facility];  Service: General;  Laterality: N/A;   IR IMAGING GUIDED PORT INSERTION  02/28/2024   KNEE SURGERY Right    arthroscopic   SHOULDER SURGERY Bilateral    removal of bone.   TONSILLECTOMY      Social History: Social History   Socioeconomic  History   Marital status: Divorced    Spouse name: Not on file   Number of children: 6   Years of education: Not on file   Highest education level: Not on file  Occupational History   Occupation: Engineer, materials  Tobacco Use   Smoking status: Former    Current packs/day: 0.50    Average packs/day: 0.5 packs/day for 23.0 years (11.5 ttl pk-yrs)    Types: Cigarettes   Smokeless tobacco: Never  Vaping Use   Vaping status: Former  Substance and Sexual Activity   Alcohol use: Yes    Comment: rare   Drug use: No   Sexual activity: Not Currently    Birth control/protection: None  Other Topics Concern   Not on file  Social History Narrative   Not on file   Social Drivers of Health   Financial Resource Strain: Not on file  Food Insecurity: No Food Insecurity (02/05/2024)   Hunger  Vital Sign    Worried About Programme researcher, broadcasting/film/video in the Last Year: Never true    Ran Out of Food in the Last Year: Never true  Transportation Needs: No Transportation Needs (02/05/2024)   PRAPARE - Administrator, Civil Service (Medical): No    Lack of Transportation (Non-Medical): No  Physical Activity: Not on file  Stress: Not on file  Social Connections: Patient Declined (01/31/2024)   Social Connection and Isolation Panel [NHANES]    Frequency of Communication with Friends and Family: Patient declined    Frequency of Social Gatherings with Friends and Family: Patient declined    Attends Religious Services: Patient declined    Database administrator or Organizations: Patient declined    Attends Banker Meetings: Patient declined    Marital Status: Patient declined  Intimate Partner Violence: Not At Risk (02/05/2024)   Humiliation, Afraid, Rape, and Kick questionnaire    Fear of Current or Ex-Partner: No    Emotionally Abused: No    Physically Abused: No    Sexually Abused: No    Family History: Family History  Problem Relation Age of Onset   Cancer Mother    Dementia  Father    Colon cancer Neg Hx    Rectal cancer Neg Hx    Stomach cancer Neg Hx    Esophageal cancer Neg Hx     Current Medications:  Current Outpatient Medications:    acyclovir  (ZOVIRAX ) 400 MG tablet, Take 1 tablet (400 mg total) by mouth daily., Disp: 30 tablet, Rfl: 5   alfuzosin  (UROXATRAL ) 10 MG 24 hr tablet, Take 10 mg by mouth daily., Disp: , Rfl:    allopurinol  (ZYLOPRIM ) 300 MG tablet, Take 1 tablet (300 mg total) by mouth daily., Disp: 30 tablet, Rfl: 0   ALPRAZolam  (XANAX ) 0.5 MG tablet, Take 0.5 mg by mouth 2 (two) times daily., Disp: , Rfl:    apixaban  (ELIQUIS ) 5 MG TABS tablet, Take 1 tablet (5 mg total) by mouth 2 (two) times daily. Please start around August 31, 2020 after you complete the initial starter pack, Disp: 60 tablet, Rfl: 3   Cholecalciferol (VITAMIN D3) 5000 units CAPS, Take 2 capsules by mouth daily. , Disp: , Rfl:    furosemide (LASIX) 40 MG tablet, Take 40 mg by mouth daily., Disp: , Rfl:    gabapentin (NEURONTIN) 600 MG tablet, Take 600 mg by mouth 3 (three) times daily., Disp: , Rfl:    hydrocortisone  (CORTEF ) 10 MG tablet, TAKE 1 TABLET DAILY AT 8 A.M. AND 1 TABLET DAILY AT NOON, Disp: 180 tablet, Rfl: 1   insulin  glargine (LANTUS ) 100 UNIT/ML Solostar Pen, Inject 50 Units into the skin daily., Disp: , Rfl:    lidocaine -prilocaine  (EMLA ) cream, Apply topically as needed (Place small amount to port site 30 minutes prior to port access.). Apply to affected area once, Disp: 30 g, Rfl: 3   Multiple Vitamins-Minerals (MULTIVITAMIN PO), Take 1 tablet by mouth daily., Disp: , Rfl:    Omega-3 Fatty Acids (FISH OIL) 1200 MG CAPS, Take 1 capsule by mouth daily., Disp: , Rfl:    ondansetron  (ZOFRAN ) 8 MG tablet, Take 1 tablet (8 mg total) by mouth every 8 (eight) hours as needed for nausea or vomiting. Start on the second day after chemotherapy. Then take as needed for nausea or vomiting., Disp: 30 tablet, Rfl: 1   Oxycodone  HCl 10 MG TABS, Take 10 mg by mouth 4  (four) times daily., Disp: ,  Rfl:    pantoprazole  (PROTONIX ) 40 MG tablet, Take 40 mg by mouth daily., Disp: , Rfl:    potassium chloride  (KLOR-CON ) 10 MEQ tablet, Take 10 mEq by mouth daily., Disp: , Rfl:    prochlorperazine  (COMPAZINE ) 10 MG tablet, Take 1 tablet (10 mg total) by mouth every 6 (six) hours as needed for nausea or vomiting., Disp: 30 tablet, Rfl: 1   rosuvastatin  (CRESTOR ) 10 MG tablet, Take 10 mg by mouth daily., Disp: , Rfl:    sulfamethoxazole -trimethoprim  (BACTRIM  DS) 800-160 MG tablet, Take 1 tablet by mouth 3 (three) times a week., Disp: 12 tablet, Rfl: 5   tirzepatide  (MOUNJARO ) 12.5 MG/0.5ML Pen, Inject 12.5 mg into the skin once a week., Disp: 6 mL, Rfl: 0   tiZANidine (ZANAFLEX) 2 MG tablet, Take 2 mg by mouth 2 (two) times daily as needed., Disp: , Rfl:    Allergies: Allergies  Allergen Reactions   Prednisone  Other (See Comments)    Lightheadness and dizziness    REVIEW OF SYSTEMS:   Review of Systems  Constitutional:  Negative for chills, fatigue and fever.  HENT:   Negative for lump/mass, mouth sores, nosebleeds, sore throat and trouble swallowing.   Eyes:  Negative for eye problems.  Respiratory:  Negative for cough and shortness of breath.   Cardiovascular:  Negative for chest pain, leg swelling and palpitations.  Gastrointestinal:  Negative for abdominal pain, constipation, diarrhea, nausea and vomiting.  Genitourinary:  Negative for bladder incontinence, difficulty urinating, dysuria, frequency, hematuria and nocturia.   Musculoskeletal:  Negative for arthralgias, back pain, flank pain, myalgias and neck pain.  Skin:  Negative for itching and rash.  Neurological:  Negative for dizziness, headaches and numbness.  Hematological:  Does not bruise/bleed easily.  Psychiatric/Behavioral:  Negative for depression, sleep disturbance and suicidal ideas. The patient is not nervous/anxious.   All other systems reviewed and are negative.    VITALS:   There  were no vitals taken for this visit.  Wt Readings from Last 3 Encounters:  04/28/24 217 lb (98.4 kg)  04/10/24 217 lb (98.4 kg)  03/31/24 223 lb 5.2 oz (101.3 kg)    There is no height or weight on file to calculate BMI.  Performance status (ECOG): 1 - Symptomatic but completely ambulatory  PHYSICAL EXAM:   Physical Exam Vitals and nursing note reviewed. Exam conducted with a chaperone present.  Constitutional:      Appearance: Normal appearance.  Cardiovascular:     Rate and Rhythm: Normal rate and regular rhythm.     Pulses: Normal pulses.     Heart sounds: Normal heart sounds.  Pulmonary:     Effort: Pulmonary effort is normal.     Breath sounds: Normal breath sounds.  Abdominal:     Palpations: Abdomen is soft. There is no hepatomegaly, splenomegaly or mass.     Tenderness: There is no abdominal tenderness.  Musculoskeletal:     Right lower leg: No edema.     Left lower leg: No edema.  Lymphadenopathy:     Cervical: No cervical adenopathy.     Right cervical: No superficial, deep or posterior cervical adenopathy.    Left cervical: No superficial, deep or posterior cervical adenopathy.     Upper Body:     Right upper body: No supraclavicular or axillary adenopathy.     Left upper body: No supraclavicular or axillary adenopathy.  Neurological:     General: No focal deficit present.     Mental Status: He is alert  and oriented to person, place, and time.  Psychiatric:        Mood and Affect: Mood normal.        Behavior: Behavior normal.     LABS:   CBC     Component Value Date/Time   WBC 4.3 04/28/2024 0749   RBC 3.73 (L) 04/28/2024 0749   HGB 11.2 (L) 04/28/2024 0749   HCT 34.4 (L) 04/28/2024 0749   PLT 112 (L) 04/28/2024 0749   MCV 92.2 04/28/2024 0749   MCH 30.0 04/28/2024 0749   MCHC 32.6 04/28/2024 0749   RDW 16.2 (H) 04/28/2024 0749   LYMPHSABS 0.9 04/28/2024 0749   MONOABS 0.8 04/28/2024 0749   EOSABS 0.4 04/28/2024 0749   BASOSABS 0.1 04/28/2024  0749    CMP      Component Value Date/Time   NA 136 04/28/2024 0749   NA 143 01/22/2024 1307   K 4.1 04/28/2024 0749   CL 101 04/28/2024 0749   CO2 27 04/28/2024 0749   GLUCOSE 207 (H) 04/28/2024 0749   BUN 16 04/28/2024 0749   BUN 17 01/22/2024 1307   CREATININE 1.57 (H) 04/28/2024 0749   CALCIUM  9.4 04/28/2024 0749   PROT 6.5 04/28/2024 0749   PROT 7.0 01/22/2024 1307   ALBUMIN 3.6 04/28/2024 0749   ALBUMIN 4.5 01/22/2024 1307   AST 20 04/28/2024 0749   ALT 16 04/28/2024 0749   ALKPHOS 66 04/28/2024 0749   BILITOT 0.5 04/28/2024 0749   BILITOT 0.3 01/22/2024 1307   GFRNONAA 48 (L) 04/28/2024 0749   GFRAA 59 (L) 08/01/2020 0758     No results found for: "CEA1", "CEA" / No results found for: "CEA1", "CEA" No results found for: "PSA1" No results found for: "BJY782" No results found for: "CAN125"  No results found for: "TOTALPROTELP", "ALBUMINELP", "A1GS", "A2GS", "BETS", "BETA2SER", "GAMS", "MSPIKE", "SPEI" No results found for: "TIBC", "FERRITIN", "IRONPCTSAT" Lab Results  Component Value Date   LDH 109 02/18/2024   LDH 116 01/15/2024     STUDIES:   DG Chest 2 View Result Date: 04/10/2024 CLINICAL DATA:  Shortness of breath EXAM: CHEST - 2 VIEW COMPARISON:  Chest x-ray 01/30/2024 FINDINGS: Right chest port catheter tip ends in the SVC. The heart size and mediastinal contours are within normal limits. Both lungs are clear. The visualized skeletal structures are unremarkable. IMPRESSION: No active cardiopulmonary disease. Electronically Signed   By: Tyron Gallon M.D.   On: 04/10/2024 17:44

## 2024-04-28 ENCOUNTER — Inpatient Hospital Stay: Admitting: Hematology

## 2024-04-28 ENCOUNTER — Inpatient Hospital Stay (HOSPITAL_BASED_OUTPATIENT_CLINIC_OR_DEPARTMENT_OTHER): Admitting: Hematology

## 2024-04-28 ENCOUNTER — Inpatient Hospital Stay: Attending: Hematology

## 2024-04-28 ENCOUNTER — Inpatient Hospital Stay

## 2024-04-28 VITALS — BP 105/67 | HR 93 | Temp 97.7°F | Resp 17

## 2024-04-28 DIAGNOSIS — Z5111 Encounter for antineoplastic chemotherapy: Secondary | ICD-10-CM | POA: Diagnosis present

## 2024-04-28 DIAGNOSIS — Z794 Long term (current) use of insulin: Secondary | ICD-10-CM | POA: Insufficient documentation

## 2024-04-28 DIAGNOSIS — Z79899 Other long term (current) drug therapy: Secondary | ICD-10-CM | POA: Insufficient documentation

## 2024-04-28 DIAGNOSIS — C8259 Diffuse follicle center lymphoma, extranodal and solid organ sites: Secondary | ICD-10-CM

## 2024-04-28 DIAGNOSIS — E119 Type 2 diabetes mellitus without complications: Secondary | ICD-10-CM | POA: Insufficient documentation

## 2024-04-28 DIAGNOSIS — Z5189 Encounter for other specified aftercare: Secondary | ICD-10-CM | POA: Insufficient documentation

## 2024-04-28 DIAGNOSIS — Z452 Encounter for adjustment and management of vascular access device: Secondary | ICD-10-CM | POA: Diagnosis not present

## 2024-04-28 DIAGNOSIS — R7989 Other specified abnormal findings of blood chemistry: Secondary | ICD-10-CM

## 2024-04-28 DIAGNOSIS — R7309 Other abnormal glucose: Secondary | ICD-10-CM

## 2024-04-28 DIAGNOSIS — Z5112 Encounter for antineoplastic immunotherapy: Secondary | ICD-10-CM | POA: Insufficient documentation

## 2024-04-28 DIAGNOSIS — R739 Hyperglycemia, unspecified: Secondary | ICD-10-CM

## 2024-04-28 DIAGNOSIS — C8299 Follicular lymphoma, unspecified, extranodal and solid organ sites: Secondary | ICD-10-CM | POA: Diagnosis present

## 2024-04-28 LAB — COMPREHENSIVE METABOLIC PANEL WITH GFR
ALT: 16 U/L (ref 0–44)
AST: 20 U/L (ref 15–41)
Albumin: 3.6 g/dL (ref 3.5–5.0)
Alkaline Phosphatase: 66 U/L (ref 38–126)
Anion gap: 8 (ref 5–15)
BUN: 16 mg/dL (ref 8–23)
CO2: 27 mmol/L (ref 22–32)
Calcium: 9.4 mg/dL (ref 8.9–10.3)
Chloride: 101 mmol/L (ref 98–111)
Creatinine, Ser: 1.57 mg/dL — ABNORMAL HIGH (ref 0.61–1.24)
GFR, Estimated: 48 mL/min — ABNORMAL LOW (ref >60)
Glucose, Bld: 207 mg/dL — ABNORMAL HIGH (ref 70–99)
Potassium: 4.1 mmol/L (ref 3.5–5.1)
Sodium: 136 mmol/L (ref 135–145)
Total Bilirubin: 0.5 mg/dL (ref 0.0–1.2)
Total Protein: 6.5 g/dL (ref 6.5–8.1)

## 2024-04-28 LAB — CBC WITH DIFFERENTIAL/PLATELET
Abs Immature Granulocytes: 0.07 10*3/uL (ref 0.00–0.07)
Basophils Absolute: 0.1 10*3/uL (ref 0.0–0.1)
Basophils Relative: 1 %
Eosinophils Absolute: 0.4 10*3/uL (ref 0.0–0.5)
Eosinophils Relative: 9 %
HCT: 34.4 % — ABNORMAL LOW (ref 39.0–52.0)
Hemoglobin: 11.2 g/dL — ABNORMAL LOW (ref 13.0–17.0)
Immature Granulocytes: 2 %
Lymphocytes Relative: 20 %
Lymphs Abs: 0.9 10*3/uL (ref 0.7–4.0)
MCH: 30 pg (ref 26.0–34.0)
MCHC: 32.6 g/dL (ref 30.0–36.0)
MCV: 92.2 fL (ref 80.0–100.0)
Monocytes Absolute: 0.8 10*3/uL (ref 0.1–1.0)
Monocytes Relative: 18 %
Neutro Abs: 2.1 10*3/uL (ref 1.7–7.7)
Neutrophils Relative %: 50 %
Platelets: 112 10*3/uL — ABNORMAL LOW (ref 150–400)
RBC: 3.73 MIL/uL — ABNORMAL LOW (ref 4.22–5.81)
RDW: 16.2 % — ABNORMAL HIGH (ref 11.5–15.5)
WBC: 4.3 10*3/uL (ref 4.0–10.5)
nRBC: 0 % (ref 0.0–0.2)

## 2024-04-28 LAB — MAGNESIUM: Magnesium: 2 mg/dL (ref 1.7–2.4)

## 2024-04-28 LAB — URIC ACID: Uric Acid, Serum: 4 mg/dL (ref 3.7–8.6)

## 2024-04-28 MED ORDER — SODIUM CHLORIDE 0.9 % IV SOLN
1000.0000 mg | Freq: Once | INTRAVENOUS | Status: AC
  Administered 2024-04-28: 1000 mg via INTRAVENOUS
  Filled 2024-04-28: qty 40

## 2024-04-28 MED ORDER — SODIUM CHLORIDE 0.9% FLUSH
10.0000 mL | INTRAVENOUS | Status: DC | PRN
Start: 2024-04-28 — End: 2024-04-28
  Administered 2024-04-28: 10 mL

## 2024-04-28 MED ORDER — SODIUM CHLORIDE 0.9 % IV SOLN
70.0000 mg/m2 | Freq: Once | INTRAVENOUS | Status: AC
  Administered 2024-04-28: 150 mg via INTRAVENOUS
  Filled 2024-04-28: qty 6

## 2024-04-28 MED ORDER — HEPARIN SOD (PORK) LOCK FLUSH 100 UNIT/ML IV SOLN
500.0000 [IU] | Freq: Once | INTRAVENOUS | Status: AC | PRN
  Administered 2024-04-28: 500 [IU]

## 2024-04-28 MED ORDER — SODIUM CHLORIDE 0.9 % IV SOLN
20.0000 mg | Freq: Once | INTRAVENOUS | Status: AC
  Administered 2024-04-28: 20 mg via INTRAVENOUS
  Filled 2024-04-28: qty 20

## 2024-04-28 MED ORDER — FAMOTIDINE IN NACL 20-0.9 MG/50ML-% IV SOLN
20.0000 mg | Freq: Once | INTRAVENOUS | Status: AC
  Administered 2024-04-28: 20 mg via INTRAVENOUS
  Filled 2024-04-28: qty 50

## 2024-04-28 MED ORDER — CETIRIZINE HCL 10 MG/ML IV SOLN
10.0000 mg | Freq: Once | INTRAVENOUS | Status: AC
  Administered 2024-04-28: 10 mg via INTRAVENOUS
  Filled 2024-04-28: qty 1

## 2024-04-28 MED ORDER — SODIUM CHLORIDE 0.9 % IV SOLN: Freq: Once | INTRAVENOUS | Status: AC

## 2024-04-28 MED ORDER — LORAZEPAM 2 MG/ML IJ SOLN
0.2500 mg | Freq: Once | INTRAMUSCULAR | Status: AC
  Administered 2024-04-28: 0.25 mg via INTRAVENOUS
  Filled 2024-04-28: qty 1

## 2024-04-28 MED ORDER — INSULIN ASPART 100 UNIT/ML IJ SOLN
10.0000 [IU] | Freq: Once | INTRAMUSCULAR | Status: AC
  Administered 2024-04-28: 10 [IU] via SUBCUTANEOUS
  Filled 2024-04-28: qty 1

## 2024-04-28 MED ORDER — LORAZEPAM 2 MG/ML IJ SOLN: 0.5000 mg | Freq: Once | INTRAMUSCULAR | Status: DC

## 2024-04-28 MED ORDER — PALONOSETRON HCL INJECTION 0.25 MG/5ML
0.2500 mg | Freq: Once | INTRAVENOUS | Status: AC
  Administered 2024-04-28: 0.25 mg via INTRAVENOUS
  Filled 2024-04-28: qty 5

## 2024-04-28 MED ORDER — ACETAMINOPHEN 325 MG PO TABS
650.0000 mg | ORAL_TABLET | Freq: Once | ORAL | Status: AC
  Administered 2024-04-28: 650 mg via ORAL
  Filled 2024-04-28: qty 2

## 2024-04-28 MED ORDER — SODIUM CHLORIDE 0.9 % IV SOLN: INTRAVENOUS | Status: DC

## 2024-04-28 NOTE — Patient Instructions (Signed)

## 2024-04-28 NOTE — Progress Notes (Signed)
 Patient presents today for chemotherapy infusion. Patient is in satisfactory condition with no new complaints voiced.  Vital signs are stable.  Labs reviewed by Dr. Cheree Cords during the office visit.  Creatinine today is 1.57.  We will give NS 1 L over 2 hours today and NS 500 mL tomorrow per Dr. Cheree Cords.  All other labs are within treatment parameters.  Glucose today is 207.  We will give Novolog  10 units SQ x one dose today per standing orders by Dr. Cheree Cords.  We will proceed with treatment per MD orders.   Patient tolerated treatment well with no complaints voiced.  Patient left ambulatory in stable condition.  Vital signs stable at discharge.  Follow up as scheduled.

## 2024-04-28 NOTE — Progress Notes (Signed)
 Patient has been examined by Dr. Ellin Saba. Vital signs and labs have been reviewed by MD - ANC, Creatinine (1.57), LFTs, hemoglobin, and platelets are within treatment parameters per M.D. - pt may proceed with treatment.  Primary RN and pharmacy notified.

## 2024-04-28 NOTE — Progress Notes (Signed)
 Received orders to add pegfilgrastim to day 3 of each chemotherapy cycle.  Stimufend 6 mg subcutaneous x 1 on day 3 added to each cycle.  Ok to treat with BP - patient to receive fluids prior to treatment.  Confirmed Bendeka  at 70 mg/m2 - updated dose in plan.   Decrease dose of lorazepam  to 0.25 mg IV x 1   T.O. Dr Davina Ester, PharmD

## 2024-04-28 NOTE — Patient Instructions (Signed)
 [facility] [place] - A DEPT OF [place].  [facility]  Discharge Instructions: Thank you for choosing [facility] to provide your oncology and hematology care.  If you have a lab appointment with the Cancer Center - please note that after April 8th, 2024, all labs will be drawn in the cancer center.  You do not have to check in or register with the main entrance as you have in the past but will complete your check-in in the cancer center.  Wear comfortable clothing and clothing appropriate for easy access to any Portacath or PICC line.   We strive to give you quality time with your provider. You may need to reschedule your appointment if you arrive late (15 or more minutes).  Arriving late affects you and other patients whose appointments are after yours.  Also, if you miss three or more appointments without notifying the office, you may be dismissed from the clinic at the provider's discretion.      For prescription refill requests, have your pharmacy contact our office and allow 72 hours for refills to be completed.    Today you received the following chemotherapy and/or immunotherapy agents Gazyva /Bendeka .  Obinutuzumab  Injection What is this medication? OBINUTUZUMAB  (OH bi nue TOOZ ue mab) treats leukemia and lymphoma. It works by blocking a protein that causes cancer cells to grow and multiply. This helps to slow or stop the spread of cancer cells. It is a monoclonal antibody. This medicine may be used for other purposes; ask your health care provider or pharmacist if you have questions. COMMON BRAND NAME(S): GAZYVA  What should I tell my care team before I take this medication? They need to know if you have any of these conditions: Heart disease Infection, especially a viral infection, such as hepatitis B Lung or breathing disease Take medications that treat or prevent blood clots An unusual or allergic reaction to obinutuzumab , other medications, foods, dyes,  or preservatives Pregnant or trying to get pregnant Breastfeeding How should I use this medication? This medication is for infusion into a vein. It is given by a care team in a hospital or clinic setting. Talk to your care team about the use of this medication in children. Special care may be needed. Overdosage: If you think you have taken too much of this medicine contact a poison control center or emergency room at once. NOTE: This medicine is only for you. Do not share this medicine with others. What if I miss a dose? Keep appointments for follow-up doses as directed. It is important not to miss your dose. Call your care team if you are unable to keep an appointment. What may interact with this medication? Live virus vaccines This list may not describe all possible interactions. Give your health care provider a list of all the medicines, herbs, non-prescription drugs, or dietary supplements you use. Also tell them if you smoke, drink alcohol, or use illegal drugs. Some items may interact with your medicine. What should I watch for while using this medication? Report any side effects that you notice during your treatment right away, such as changes in your breathing, fever, chills, dizziness or lightheadedness. These effects are more common with the first dose. Visit your care team for checks on your progress. You will need to have regular blood work. Report any other side effects. The side effects of this medication can continue after you finish your treatment. Continue your course of treatment even though you feel ill unless  your care team tells you to stop. Call your care team for advice if you get a fever, chills or sore throat, or other symptoms of a cold or flu. Do not treat yourself. This medication decreases your body's ability to fight infections. Try to avoid being around people who are sick. This medication may increase your risk to bruise or bleed. Call your care team if you notice any  unusual bleeding. Do not become pregnant while taking this medication or for 6 months after stopping it. Inform your care team if you wish to become pregnant or think you might be pregnant. There is a potential for serious side effects to an unborn child. Talk to your care team or pharmacist for more information. Do not breast-feed an infant while taking this medication or for 6 months after stopping it. What side effects may I notice from receiving this medication? Side effects that you should report to your care team as soon as possible: Allergic reactions--skin rash, itching, hives, swelling of the face, lips, tongue, or throat Bleeding--bloody or black, tar-like stools, vomiting blood or [place] material that looks like coffee grounds, red or dark Arley Garant urine, small red or purple spots on skin, unusual bruising or bleeding Blood clot--pain, swelling, or warmth in the leg, shortness of breath, chest pain Dizziness, loss of balance or coordination, confusion or trouble speaking Infection--fever, chills, cough, sore throat, wounds that don't heal, pain or trouble when passing urine, general feeling of discomfort or being unwell Infusion reactions--chest pain, shortness of breath or trouble breathing, feeling faint or lightheaded Liver injury--right upper belly pain, loss of appetite, nausea, light-colored stool, dark yellow or [place]ika Coker urine, yellowing skin or eyes, unusual weakness or fatigue Tumor lysis syndrome (TLS)--nausea, vomiting, diarrhea, decrease in the amount of urine, dark urine, unusual weakness or fatigue, confusion, muscle pain or cramps, fast or irregular heartbeat, joint pain Side effects that usually do not require medical attention (report to your care team if they continue or are bothersome): Bone, joint, or muscle pain Constipation Diarrhea Fatigue Runny or stuffy nose Sore throat This list may not describe all possible side effects. Call your doctor for medical advice about side  effects. You may report side effects to FDA at 1-800-FDA-1088. Where should I keep my medication? This medication is only given in a hospital or clinic and will not be stored at home. NOTE: This sheet is a summary. It may not cover all possible information. If you have questions about this medicine, talk to your doctor, pharmacist, or health care provider.  2024 Elsevier/Gold Standard (2022-04-26 00:00:00)   Bendamustine  Injection What is this medication? BENDAMUSTINE  (BEN da MUS teen) treats leukemia and lymphoma. It works by slowing down the growth of cancer cells. This medicine may be used for other purposes; ask your health care provider or pharmacist if you have questions. COMMON BRAND NAME(S): BELRAPZO , BENDEKA , Treanda , VIVIMUSTA  What should I tell my care team before I take this medication? They need to know if you have any of these conditions: Infection, especially a viral infection, such as chickenpox, cold sores, herpes Kidney disease Liver disease An unusual or allergic reaction to bendamustine , mannitol, other medications, foods, dyes, or preservatives Pregnant or trying to get pregnant Breast-feeding How should I use this medication? This medication is injected into a vein. It is given by your care team in a hospital or clinic setting. Talk to your care team about the use of this medication in children. Special care may be needed. Overdosage: If you  think you have taken too much of this medicine contact a poison control center or emergency room at once. NOTE: This medicine is only for you. Do not share this medicine with others. What if I miss a dose? Keep appointments for follow-up doses. It is important not to miss your dose. Call your care team if you are unable to keep an appointment. What may interact with this medication? Do not take this medication with any of the following: Clozapine This medication may also interact with the  following: Atazanavir Cimetidine Ciprofloxacin  Enoxacin Fluvoxamine Medications for seizures, such as carbamazepine, phenobarbital Mexiletine Rifampin Tacrine Thiabendazole Zileuton This list may not describe all possible interactions. Give your health care provider a list of all the medicines, herbs, non-prescription drugs, or dietary supplements you use. Also tell them if you smoke, drink alcohol, or use illegal drugs. Some items may interact with your medicine. What should I watch for while using this medication? Visit your care team for regular checks on your progress. This medication may make you feel generally unwell. This is not uncommon, as chemotherapy can affect healthy cells as well as cancer cells. Report any side effects. Continue your course of treatment even though you feel ill unless your care team tells you to stop. You may need blood work while taking this medication. This medication may increase your risk of getting an infection. Call your care team for advice if you get a fever, chills, sore throat, or other symptoms of a cold or flu. Do not treat yourself. Try to avoid being around people who are sick. This medication may cause serious skin reactions. They can happen weeks to months after starting the medication. Contact your care team right away if you notice fevers or flu-like symptoms with a rash. The rash may be red or purple and then turn into blisters or peeling of the skin. You may also notice a red rash with swelling of the face, lips, or lymph nodes in your neck or under your arms. In some patients, this medication may cause a serious brain infection that may cause death. If you have any problems seeing, thinking, speaking, walking, or standing, tell your care team right away. If you cannot reach your care team, urgently seek other source of medical care. This medication may increase your risk to bruise or bleed. Call your care team if you notice any unusual  bleeding. Talk to your care team about your risk of cancer. You may be more at risk for certain types of cancer if you take this medication. Talk to your care team about your risk of skin cancer. You may be more at risk for skin cancer if you take this medication. Talk to your care team if you or your partner wish to become pregnant or think either of you might be pregnant. This medication can cause serious birth defects if taken during pregnancy or for up to 6 months after the last dose. A negative pregnancy test is required before starting this medication. A reliable form of contraception is recommended while taking this medication and for 6 months after the last dose. Talk to your care team about reliable forms of contraception. Wear a condom while taking this medication and for at least 3 months after the last dose. Do not breast-feed while taking this medication or for at least 1 week after the last dose. This medication may cause infertility. Talk to your care team if you are concerned about your fertility. What side effects may I  notice from receiving this medication? Side effects that you should report to your care team as soon as possible: Allergic reactions--skin rash, itching, hives, swelling of the face, lips, tongue, or throat Infection--fever, chills, cough, sore throat, wounds that don't heal, pain or trouble when passing urine, general feeling of discomfort or being unwell Infusion reactions--chest pain, shortness of breath or trouble breathing, feeling faint or lightheaded Liver injury--right upper belly pain, loss of appetite, nausea, light-colored stool, dark yellow or Imoni Kohen urine, yellowing skin or eyes, unusual weakness or fatigue Low red blood cell level--unusual weakness or fatigue, dizziness, headache, trouble breathing Painful swelling, warmth, or redness of the skin, blisters or sores at the infusion site Rash, fever, and swollen lymph nodes Redness, blistering, peeling, or  loosening of the skin, including inside the mouth Tumor lysis syndrome (TLS)--nausea, vomiting, diarrhea, decrease in the amount of urine, dark urine, unusual weakness or fatigue, confusion, muscle pain or cramps, fast or irregular heartbeat, joint pain Unusual bruising or bleeding Side effects that usually do not require medical attention (report to your care team if they continue or are bothersome): Diarrhea Fatigue Headache Loss of appetite Nausea Vomiting This list may not describe all possible side effects. Call your doctor for medical advice about side effects. You may report side effects to FDA at 1-800-FDA-1088. Where should I keep my medication? This medication is given in a hospital or clinic. It will not be stored at home. NOTE: This sheet is a summary. It may not cover all possible information. If you have questions about this medicine, talk to your doctor, pharmacist, or health care provider.  2024 Elsevier/Gold Standard (2022-03-28 00:00:00)       To help prevent nausea and vomiting after your treatment, we encourage you to take your nausea medication as directed.  BELOW ARE SYMPTOMS THAT SHOULD BE REPORTED IMMEDIATELY: *FEVER GREATER THAN 100.4 F (38 C) OR HIGHER *CHILLS OR SWEATING *NAUSEA AND VOMITING THAT IS NOT CONTROLLED WITH YOUR NAUSEA MEDICATION *UNUSUAL SHORTNESS OF BREATH *UNUSUAL BRUISING OR BLEEDING *URINARY PROBLEMS (pain or burning when urinating, or frequent urination) *BOWEL PROBLEMS (unusual diarrhea, constipation, pain near the anus) TENDERNESS IN MOUTH AND THROAT WITH OR WITHOUT PRESENCE OF ULCERS (sore throat, sores in mouth, or a toothache) UNUSUAL RASH, SWELLING OR PAIN  UNUSUAL VAGINAL DISCHARGE OR ITCHING   Items with * indicate a potential emergency and should be followed up as soon as possible or go to the Emergency Department if any problems should occur.  Please show the CHEMOTHERAPY ALERT CARD or IMMUNOTHERAPY ALERT CARD at check-in to  the Emergency Department and triage nurse.  Should you have questions after your visit or need to cancel or reschedule your appointment, please contact [facility] [place] - A DEPT OF [facility] [phone]  and follow the prompts.  Office hours are 8:00 a.m. to 4:30 p.m. Monday - Friday. Please note that voicemails left after 4:00 p.m. may not be returned until the following business day.  We are closed weekends and major holidays. You have access to a nurse at all times for urgent questions. Please call the main number to the clinic [phone] and follow the prompts.  For any non-urgent questions, you may also contact your provider using MyChart. We now offer e-Visits for anyone 23 and older to request care online for non-urgent symptoms. For details visit [email or web address].   Also download the MyChart app! Go to the app store, search "MyChart", open the app, select [place], and  log in with your MyChart username and password.

## 2024-04-29 ENCOUNTER — Inpatient Hospital Stay

## 2024-04-30 ENCOUNTER — Inpatient Hospital Stay

## 2024-04-30 VITALS — BP 98/68 | HR 92 | Temp 97.2°F | Resp 18

## 2024-04-30 DIAGNOSIS — Z5112 Encounter for antineoplastic immunotherapy: Secondary | ICD-10-CM | POA: Diagnosis not present

## 2024-04-30 DIAGNOSIS — C8259 Diffuse follicle center lymphoma, extranodal and solid organ sites: Secondary | ICD-10-CM

## 2024-04-30 MED ORDER — DEXAMETHASONE SODIUM PHOSPHATE 10 MG/ML IJ SOLN
10.0000 mg | Freq: Once | INTRAMUSCULAR | Status: AC
  Administered 2024-04-30: 10 mg via INTRAVENOUS
  Filled 2024-04-30: qty 1

## 2024-04-30 MED ORDER — SODIUM CHLORIDE 0.9 % IV SOLN: INTRAVENOUS | Status: DC

## 2024-04-30 MED ORDER — SODIUM CHLORIDE 0.9 % IV SOLN
70.0000 mg/m2 | Freq: Once | INTRAVENOUS | Status: AC
  Administered 2024-04-30: 150 mg via INTRAVENOUS
  Filled 2024-04-30: qty 6

## 2024-04-30 MED ORDER — HEPARIN SOD (PORK) LOCK FLUSH 100 UNIT/ML IV SOLN
500.0000 [IU] | Freq: Once | INTRAVENOUS | Status: AC | PRN
Start: 2024-04-30 — End: 2024-04-30
  Administered 2024-04-30: 500 [IU]

## 2024-04-30 MED ORDER — SODIUM CHLORIDE 0.9% FLUSH
10.0000 mL | INTRAVENOUS | Status: DC | PRN
  Administered 2024-04-30: 10 mL

## 2024-04-30 NOTE — Patient Instructions (Signed)
 [facility] [place] - A DEPT OF [place]  Discharge Instructions: Thank you for choosing [facility] to provide your oncology and hematology care.  If you have a lab appointment with the Cancer Center - please note that after April 8th, 2024, all labs will be drawn in the cancer center.  You do not have to check in or register with the main entrance as you have in the past but will complete your check-in in the cancer center.  Wear comfortable clothing and clothing appropriate for easy access to any Portacath or PICC line.   We strive to give you quality time with your provider. You may need to reschedule your appointment if you arrive late (15 or more minutes).  Arriving late affects you and other patients whose appointments are after yours.  Also, if you miss three or more appointments without notifying the office, you may be dismissed from the clinic at the provider's discretion.      For prescription refill requests, have your pharmacy contact our office and allow 72 hours for refills to be completed.    Today you received the following chemotherapy and/or immunotherapy agents Bendeka . Bendamustine  Injection What is this medication? BENDAMUSTINE  (BEN da MUS teen) treats leukemia and lymphoma. It works by slowing down the growth of cancer cells. This medicine may be used for other purposes; ask your health care provider or pharmacist if you have questions. COMMON BRAND NAME(S): BELRAPZO , BENDEKA , Treanda , VIVIMUSTA  What should I tell my care team before I take this medication? They need to know if you have any of these conditions: Infection, especially a viral infection, such as chickenpox, cold sores, herpes Kidney disease Liver disease An unusual or allergic reaction to bendamustine , mannitol, other medications, foods, dyes, or preservatives Pregnant or trying to get pregnant Breast-feeding How should I use this medication? This medication is  injected into a vein. It is given by your care team in a hospital or clinic setting. Talk to your care team about the use of this medication in children. Special care may be needed. Overdosage: If you think you have taken too much of this medicine contact a poison control center or emergency room at once. NOTE: This medicine is only for you. Do not share this medicine with others. What if I miss a dose? Keep appointments for follow-up doses. It is important not to miss your dose. Call your care team if you are unable to keep an appointment. What may interact with this medication? Do not take this medication with any of the following: Clozapine This medication may also interact with the following: Atazanavir Cimetidine Ciprofloxacin  Enoxacin Fluvoxamine Medications for seizures, such as carbamazepine, phenobarbital Mexiletine Rifampin Tacrine Thiabendazole Zileuton This list may not describe all possible interactions. Give your health care provider a list of all the medicines, herbs, non-prescription drugs, or dietary supplements you use. Also tell them if you smoke, drink alcohol, or use illegal drugs. Some items may interact with your medicine. What should I watch for while using this medication? Visit your care team for regular checks on your progress. This medication may make you feel generally unwell. This is not uncommon, as chemotherapy can affect healthy cells as well as cancer cells. Report any side effects. Continue your course of treatment even though you feel ill unless your care team tells you to stop. You may need blood work while taking this medication. This medication may increase your risk of getting an infection. Call  your care team for advice if you get a fever, chills, sore throat, or other symptoms of a cold or flu. Do not treat yourself. Try to avoid being around people who are sick. This medication may cause serious skin reactions. They can happen weeks to months after  starting the medication. Contact your care team right away if you notice fevers or flu-like symptoms with a rash. The rash may be red or purple and then turn into blisters or peeling of the skin. You may also notice a red rash with swelling of the face, lips, or lymph nodes in your neck or under your arms. In some patients, this medication may cause a serious brain infection that may cause death. If you have any problems seeing, thinking, speaking, walking, or standing, tell your care team right away. If you cannot reach your care team, urgently seek other source of medical care. This medication may increase your risk to bruise or bleed. Call your care team if you notice any unusual bleeding. Talk to your care team about your risk of cancer. You may be more at risk for certain types of cancer if you take this medication. Talk to your care team about your risk of skin cancer. You may be more at risk for skin cancer if you take this medication. Talk to your care team if you or your partner wish to become pregnant or think either of you might be pregnant. This medication can cause serious birth defects if taken during pregnancy or for up to 6 months after the last dose. A negative pregnancy test is required before starting this medication. A reliable form of contraception is recommended while taking this medication and for 6 months after the last dose. Talk to your care team about reliable forms of contraception. Wear a condom while taking this medication and for at least 3 months after the last dose. Do not breast-feed while taking this medication or for at least 1 week after the last dose. This medication may cause infertility. Talk to your care team if you are concerned about your fertility. What side effects may I notice from receiving this medication? Side effects that you should report to your care team as soon as possible: Allergic reactions--skin rash, itching, hives, swelling of the face, lips,  tongue, or throat Infection--fever, chills, cough, sore throat, wounds that don't heal, pain or trouble when passing urine, general feeling of discomfort or being unwell Infusion reactions--chest pain, shortness of breath or trouble breathing, feeling faint or lightheaded Liver injury--right upper belly pain, loss of appetite, nausea, light-colored stool, dark yellow or brown urine, yellowing skin or eyes, unusual weakness or fatigue Low red blood cell level--unusual weakness or fatigue, dizziness, headache, trouble breathing Painful swelling, warmth, or redness of the skin, blisters or sores at the infusion site Rash, fever, and swollen lymph nodes Redness, blistering, peeling, or loosening of the skin, including inside the mouth Tumor lysis syndrome (TLS)--nausea, vomiting, diarrhea, decrease in the amount of urine, dark urine, unusual weakness or fatigue, confusion, muscle pain or cramps, fast or irregular heartbeat, joint pain Unusual bruising or bleeding Side effects that usually do not require medical attention (report to your care team if they continue or are bothersome): Diarrhea Fatigue Headache Loss of appetite Nausea Vomiting This list may not describe all possible side effects. Call your doctor for medical advice about side effects. You may report side effects to FDA at 1-800-FDA-1088. Where should I keep my medication? This medication is given in a  hospital or clinic. It will not be stored at home. NOTE: This sheet is a summary. It may not cover all possible information. If you have questions about this medicine, talk to your doctor, pharmacist, or health care provider.  2024 Elsevier/Gold Standard (2022-03-28 00:00:00)      To help prevent nausea and vomiting after your treatment, we encourage you to take your nausea medication as directed.  BELOW ARE SYMPTOMS THAT SHOULD BE REPORTED IMMEDIATELY: *FEVER GREATER THAN 100.4 F (38 C) OR HIGHER *CHILLS OR SWEATING *NAUSEA AND  VOMITING THAT IS NOT CONTROLLED WITH YOUR NAUSEA MEDICATION *UNUSUAL SHORTNESS OF BREATH *UNUSUAL BRUISING OR BLEEDING *URINARY PROBLEMS (pain or burning when urinating, or frequent urination) *BOWEL PROBLEMS (unusual diarrhea, constipation, pain near the anus) TENDERNESS IN MOUTH AND THROAT WITH OR WITHOUT PRESENCE OF ULCERS (sore throat, sores in mouth, or a toothache) UNUSUAL RASH, SWELLING OR PAIN  UNUSUAL VAGINAL DISCHARGE OR ITCHING   Items with * indicate a potential emergency and should be followed up as soon as possible or go to the Emergency Department if any problems should occur.  Please show the CHEMOTHERAPY ALERT CARD or IMMUNOTHERAPY ALERT CARD at check-in to the Emergency Department and triage nurse.  Should you have questions after your visit or need to cancel or reschedule your appointment, please contact [facility] [place] - A DEPT OF [facility] [phone]  and follow the prompts.  Office hours are 8:00 a.m. to 4:30 p.m. Monday - Friday. Please note that voicemails left after 4:00 p.m. may not be returned until the following business day.  We are closed weekends and major holidays. You have access to a nurse at all times for urgent questions. Please call the main number to the clinic [phone] and follow the prompts.  For any non-urgent questions, you may also contact your provider using MyChart. We now offer e-Visits for anyone 49 and older to request care online for non-urgent symptoms. For details visit [email or web address].   Also download the MyChart app! Go to the app store, search "MyChart", open the app, select [place], and log in with your MyChart username and password.

## 2024-04-30 NOTE — Progress Notes (Signed)
 Patient presents today for D2C3 Bendeka   . Blood pressure low on arrival. Patient asymptomatic. Patient has no complaints of any side effects related to treatment.   Message received from Dr. Katragadda to infuse 500 mls of normal saline over 30 minutes for blood pressure.   Bendeka  given today per MD orders. Tolerated infusion without adverse affects. Vital signs stable. No complaints at this time. Discharged from clinic ambulatory in stable condition. Alert and oriented x 3. F/U with [facility] as scheduled.

## 2024-05-01 ENCOUNTER — Inpatient Hospital Stay

## 2024-05-01 ENCOUNTER — Other Ambulatory Visit: Payer: Self-pay | Admitting: Hematology

## 2024-05-01 VITALS — BP 108/67 | HR 74 | Temp 96.5°F | Resp 18

## 2024-05-01 VITALS — BP 89/64 | HR 106 | Temp 97.0°F | Resp 18

## 2024-05-01 DIAGNOSIS — Z5112 Encounter for antineoplastic immunotherapy: Secondary | ICD-10-CM | POA: Diagnosis not present

## 2024-05-01 DIAGNOSIS — C8259 Diffuse follicle center lymphoma, extranodal and solid organ sites: Secondary | ICD-10-CM

## 2024-05-01 MED ORDER — MIDODRINE HCL 5 MG PO TABS: 5.0000 mg | ORAL_TABLET | Freq: Three times a day (TID) | ORAL | 2 refills | Status: DC

## 2024-05-01 MED ORDER — HEPARIN SOD (PORK) LOCK FLUSH 100 UNIT/ML IV SOLN
500.0000 [IU] | Freq: Once | INTRAVENOUS | Status: AC
  Administered 2024-05-01: 500 [IU] via INTRAVENOUS

## 2024-05-01 MED ORDER — PEGFILGRASTIM-FPGK 6 MG/0.6ML ~~LOC~~ SOSY
6.0000 mg | PREFILLED_SYRINGE | Freq: Once | SUBCUTANEOUS | Status: AC
Start: 2024-05-01 — End: 2024-05-01
  Administered 2024-05-01: 6 mg via SUBCUTANEOUS
  Filled 2024-05-01: qty 0.6

## 2024-05-01 MED ORDER — SODIUM CHLORIDE 0.9% FLUSH
10.0000 mL | INTRAVENOUS | Status: DC | PRN
Start: 2024-05-01 — End: 2024-05-01
  Administered 2024-05-01: 10 mL via INTRAVENOUS

## 2024-05-01 MED ORDER — SODIUM CHLORIDE 0.9 % IV SOLN: INTRAVENOUS | Status: DC

## 2024-05-01 NOTE — Progress Notes (Signed)
Stimufend injection given per orders. Patient tolerated it well without problems. Vitals stable and discharged home from clinic ambulatory. Follow up as scheduled.

## 2024-05-01 NOTE — Progress Notes (Signed)
 Patient presents today for [facility] injection. Blood pressure 89/64 on arrival. Heart rate 106. Patient denies dizziness but states he feels light headed per patient's words. See MAR for medication list. Patient does not take blood pressure medications. Message sent to Dr. Cheree Cords and orders received to infuse 500 mls of normal saline over 2 hours. Per Dr. Katragadda Midodrine 5 mg sent to [facility] [address]. Patient to take 5mg  Midodrine three times per day and to start taking today. Instructions given to patient and understanding verbalized.

## 2024-05-01 NOTE — Patient Instructions (Signed)
 [facility] [place] - A DEPT OF [facility]  Discharge Instructions: Thank you for choosing [facility] to provide your oncology and hematology care.  If you have a lab appointment with the Cancer Center - please note that after April 8th, 2024, all labs will be drawn in the cancer center.  You do not have to check in or register with the main entrance as you have in the past but will complete your check-in in the cancer center.  Wear comfortable clothing and clothing appropriate for easy access to any Portacath or PICC line.   We strive to give you quality time with your provider. You may need to reschedule your appointment if you arrive late (15 or more minutes).  Arriving late affects you and other patients whose appointments are after yours.  Also, if you miss three or more appointments without notifying the office, you may be dismissed from the clinic at the provider's discretion.      For prescription refill requests, have your pharmacy contact our office and allow 72 hours for refills to be completed.    Today you received the following chemotherapy and/or immunotherapy agents 500 mls of normal saline.       To help prevent nausea and vomiting after your treatment, we encourage you to take your nausea medication as directed.  BELOW ARE SYMPTOMS THAT SHOULD BE REPORTED IMMEDIATELY: *FEVER GREATER THAN 100.4 F (38 C) OR HIGHER *CHILLS OR SWEATING *NAUSEA AND VOMITING THAT IS NOT CONTROLLED WITH YOUR NAUSEA MEDICATION *UNUSUAL SHORTNESS OF BREATH *UNUSUAL BRUISING OR BLEEDING *URINARY PROBLEMS (pain or burning when urinating, or frequent urination) *BOWEL PROBLEMS (unusual diarrhea, constipation, pain near the anus) TENDERNESS IN MOUTH AND THROAT WITH OR WITHOUT PRESENCE OF ULCERS (sore throat, sores in mouth, or a toothache) UNUSUAL RASH, SWELLING OR PAIN  UNUSUAL VAGINAL DISCHARGE OR ITCHING   Items with * indicate a potential emergency and  should be followed up as soon as possible or go to the Emergency Department if any problems should occur.  Please show the CHEMOTHERAPY ALERT CARD or IMMUNOTHERAPY ALERT CARD at check-in to the Emergency Department and triage nurse.  Should you have questions after your visit or need to cancel or reschedule your appointment, please contact [facility] [place] - A DEPT OF [facility] [phone]  and follow the prompts.  Office hours are 8:00 a.m. to 4:30 p.m. Monday - Friday. Please note that voicemails left after 4:00 p.m. may not be returned until the following business day.  We are closed weekends and major holidays. You have access to a nurse at all times for urgent questions. Please call the main number to the clinic [phone] and follow the prompts.  For any non-urgent questions, you may also contact your provider using MyChart. We now offer e-Visits for anyone 41 and older to request care online for non-urgent symptoms. For details visit [email or web address].   Also download the MyChart app! Go to the app store, search "MyChart", open the app, select , and log in with your MyChart username and password.

## 2024-05-01 NOTE — Progress Notes (Signed)
 Blood pressure is low today, will give fluids per MD order and reevaluate per MD.

## 2024-05-01 NOTE — Progress Notes (Signed)
 500 ml of normal saline given today per MD orders. Tolerated infusion without adverse affects. Vital signs stable. No complaints at this time. Discharged from clinic ambulatory in stable condition. Alert and oriented x 3. F/U with [facility] as scheduled.

## 2024-05-07 ENCOUNTER — Other Ambulatory Visit: Payer: Self-pay | Admitting: Hematology

## 2024-05-08 ENCOUNTER — Encounter: Payer: Self-pay | Admitting: Hematology

## 2024-05-09 NOTE — Progress Notes (Signed)
 Patient called with questions about Bactrim  and Allopurinol . Patient couldn't remember if he was supposed to continue the Bactrim  or was to stop it. Also wanted to know if the Allopurinol  was needing to be refilled. Reviewed Shawn Fleming last note with patient and plan from 04/28/2024 states-   Plan: 1.  Stage IVB classical follicular lymphoma: - He received cycle 2 on 03/31/2024. - He was evaluated on 04/10/2024 in the ER with fever.  I have reviewed blood cultures which did not grow any organism.  Respiratory panel was negative.  UA was normal.  Chest x-ray was normal. - Labs today: Normal LFTs.  Creatinine 1.57 and stable.  Platelet count is mildly low at 112.  White count is normal. - He may proceed with cycle 3 today at Bendamustine  70 mg/m.  Will add long-acting G-CSF on day 3 because of recent fever. - Blood pressure is 89/61.  He reports occasional dizziness.  He has fallen at home about 3 times in the last 3 months.  He is not on any blood pressure medications.  He will receive 1 L fluid today and half liter tomorrow.  If this continues to be a problem, will consider starting him on midodrine .   2.  ID prophylaxis: - Continue Bactrim  on Monday Wednesday and Friday and acyclovir  twice daily.   3.  Left hilar mass, PET positive: - We will follow-up on repeat PET scan after 3 cycles.  If it is still positive will consider bronchoscopy and biopsy.  We think it is from lymphoma at this point.   4.  TLS prophylaxis: - Uric acid today is 4.  Continue allopurinol  daily until he finishes the bottle on 05/01/2024.    Patient verbalizes understanding and is agreeable with plan that he will continue to take the Bactrim  on Monday, Wednesday, and Friday and will not get Allopurinol  refill.

## 2024-05-20 ENCOUNTER — Other Ambulatory Visit: Payer: Self-pay | Admitting: "Endocrinology

## 2024-05-26 ENCOUNTER — Inpatient Hospital Stay

## 2024-05-26 ENCOUNTER — Inpatient Hospital Stay (HOSPITAL_BASED_OUTPATIENT_CLINIC_OR_DEPARTMENT_OTHER): Admitting: Hematology

## 2024-05-26 ENCOUNTER — Inpatient Hospital Stay: Attending: Hematology

## 2024-05-26 VITALS — HR 100

## 2024-05-26 VITALS — BP 96/68 | HR 91 | Temp 96.9°F | Resp 18

## 2024-05-26 DIAGNOSIS — Z5112 Encounter for antineoplastic immunotherapy: Secondary | ICD-10-CM | POA: Diagnosis present

## 2024-05-26 DIAGNOSIS — C8299 Follicular lymphoma, unspecified, extranodal and solid organ sites: Secondary | ICD-10-CM | POA: Insufficient documentation

## 2024-05-26 DIAGNOSIS — I959 Hypotension, unspecified: Secondary | ICD-10-CM | POA: Insufficient documentation

## 2024-05-26 DIAGNOSIS — C8259 Diffuse follicle center lymphoma, extranodal and solid organ sites: Secondary | ICD-10-CM

## 2024-05-26 DIAGNOSIS — Z5189 Encounter for other specified aftercare: Secondary | ICD-10-CM | POA: Diagnosis not present

## 2024-05-26 DIAGNOSIS — Z5111 Encounter for antineoplastic chemotherapy: Secondary | ICD-10-CM | POA: Insufficient documentation

## 2024-05-26 LAB — CBC WITH DIFFERENTIAL/PLATELET
Abs Immature Granulocytes: 0.02 10*3/uL (ref 0.00–0.07)
Basophils Absolute: 0.1 10*3/uL (ref 0.0–0.1)
Basophils Relative: 1 %
Eosinophils Absolute: 0.2 10*3/uL (ref 0.0–0.5)
Eosinophils Relative: 3 %
HCT: 32.3 % — ABNORMAL LOW (ref 39.0–52.0)
Hemoglobin: 10.8 g/dL — ABNORMAL LOW (ref 13.0–17.0)
Immature Granulocytes: 0 %
Lymphocytes Relative: 20 %
Lymphs Abs: 1.1 10*3/uL (ref 0.7–4.0)
MCH: 31.6 pg (ref 26.0–34.0)
MCHC: 33.4 g/dL (ref 30.0–36.0)
MCV: 94.4 fL (ref 80.0–100.0)
Monocytes Absolute: 0.9 10*3/uL (ref 0.1–1.0)
Monocytes Relative: 18 %
Neutro Abs: 3.1 10*3/uL (ref 1.7–7.7)
Neutrophils Relative %: 58 %
Platelets: 173 10*3/uL (ref 150–400)
RBC: 3.42 MIL/uL — ABNORMAL LOW (ref 4.22–5.81)
RDW: 15.5 % (ref 11.5–15.5)
WBC: 5.3 10*3/uL (ref 4.0–10.5)
nRBC: 0 % (ref 0.0–0.2)

## 2024-05-26 LAB — COMPREHENSIVE METABOLIC PANEL WITH GFR
ALT: 13 U/L (ref 0–44)
AST: 20 U/L (ref 15–41)
Albumin: 3.6 g/dL (ref 3.5–5.0)
Alkaline Phosphatase: 57 U/L (ref 38–126)
Anion gap: 9 (ref 5–15)
BUN: 21 mg/dL (ref 8–23)
CO2: 27 mmol/L (ref 22–32)
Calcium: 9.2 mg/dL (ref 8.9–10.3)
Chloride: 102 mmol/L (ref 98–111)
Creatinine, Ser: 1.57 mg/dL — ABNORMAL HIGH (ref 0.61–1.24)
GFR, Estimated: 48 mL/min — ABNORMAL LOW (ref >60)
Glucose, Bld: 162 mg/dL — ABNORMAL HIGH (ref 70–99)
Potassium: 4.7 mmol/L (ref 3.5–5.1)
Sodium: 138 mmol/L (ref 135–145)
Total Bilirubin: 0.5 mg/dL (ref 0.0–1.2)
Total Protein: 6.3 g/dL — ABNORMAL LOW (ref 6.5–8.1)

## 2024-05-26 LAB — URIC ACID: Uric Acid, Serum: 5.1 mg/dL (ref 3.7–8.6)

## 2024-05-26 LAB — MAGNESIUM: Magnesium: 2.1 mg/dL (ref 1.7–2.4)

## 2024-05-26 MED ORDER — HEPARIN SOD (PORK) LOCK FLUSH 100 UNIT/ML IV SOLN: 500.0000 [IU] | Freq: Once | INTRAVENOUS | Status: DC

## 2024-05-26 MED ORDER — LORAZEPAM 2 MG/ML IJ SOLN
0.2500 mg | Freq: Once | INTRAMUSCULAR | Status: AC
  Administered 2024-05-26: 0.25 mg via INTRAVENOUS
  Filled 2024-05-26: qty 1

## 2024-05-26 MED ORDER — SODIUM CHLORIDE 0.9 % IV SOLN
1000.0000 mg | Freq: Once | INTRAVENOUS | Status: AC
  Administered 2024-05-26: 1000 mg via INTRAVENOUS
  Filled 2024-05-26: qty 40

## 2024-05-26 MED ORDER — SODIUM CHLORIDE 0.9% FLUSH
10.0000 mL | Freq: Once | INTRAVENOUS | Status: AC
  Administered 2024-05-26: 10 mL via INTRAVENOUS

## 2024-05-26 MED ORDER — HEPARIN SOD (PORK) LOCK FLUSH 100 UNIT/ML IV SOLN
500.0000 [IU] | Freq: Once | INTRAVENOUS | Status: AC | PRN
  Administered 2024-05-26: 500 [IU]

## 2024-05-26 MED ORDER — SODIUM CHLORIDE 0.9 % IV SOLN: INTRAVENOUS | Status: DC

## 2024-05-26 MED ORDER — CETIRIZINE HCL 10 MG/ML IV SOLN
10.0000 mg | Freq: Once | INTRAVENOUS | Status: AC
  Administered 2024-05-26: 10 mg via INTRAVENOUS
  Filled 2024-05-26: qty 1

## 2024-05-26 MED ORDER — SODIUM CHLORIDE 0.9% FLUSH
10.0000 mL | INTRAVENOUS | Status: DC | PRN
  Administered 2024-05-26: 10 mL

## 2024-05-26 MED ORDER — ACETAMINOPHEN 325 MG PO TABS
650.0000 mg | ORAL_TABLET | Freq: Once | ORAL | Status: AC
  Administered 2024-05-26: 650 mg via ORAL
  Filled 2024-05-26: qty 2

## 2024-05-26 MED ORDER — FAMOTIDINE IN NACL 20-0.9 MG/50ML-% IV SOLN
20.0000 mg | Freq: Once | INTRAVENOUS | Status: AC
  Administered 2024-05-26: 20 mg via INTRAVENOUS
  Filled 2024-05-26: qty 50

## 2024-05-26 MED ORDER — SODIUM CHLORIDE 0.9 % IV SOLN
70.0000 mg/m2 | Freq: Once | INTRAVENOUS | Status: AC
  Administered 2024-05-26: 150 mg via INTRAVENOUS
  Filled 2024-05-26: qty 6

## 2024-05-26 MED ORDER — PALONOSETRON HCL INJECTION 0.25 MG/5ML
0.2500 mg | Freq: Once | INTRAVENOUS | Status: AC
  Administered 2024-05-26: 0.25 mg via INTRAVENOUS
  Filled 2024-05-26: qty 5

## 2024-05-26 MED ORDER — SODIUM CHLORIDE 0.9 % IV SOLN
20.0000 mg | Freq: Once | INTRAVENOUS | Status: AC
  Administered 2024-05-26: 20 mg via INTRAVENOUS
  Filled 2024-05-26: qty 2

## 2024-05-26 NOTE — Progress Notes (Signed)
 [facility] [address] [place] [address]    Clinic Day:  05/26/2024  Referring physician: Omie Bickers, MD  Patient Care Team: Omie Bickers, MD as PCP - General (Internal Medicine) Paulett Boros, MD as Medical Oncologist (Medical Oncology) Gerhard Knuckles, RN as Oncology Nurse Navigator (Medical Oncology)   ASSESSMENT & PLAN:   Assessment: 1.  Left facial soft tissue mass: - Left facial mass for the last 4-5 months, gradually increasing.  No hearing loss/vision changes. - No fevers/night sweats.  57 pound weight loss in the last 2 years, has been on Mounjaro  for the last 1 and half year. - Ultrasound (11/30/2023): Complex 3.6 x 1.1 x 2.9 cm cystic structure in front of the left ear. - CT head (12/27/2022): Spiculated soft tissue mass around the left zygomatic arch measuring 2.2 cm in thickness and 4.3 cm in length.  Multifocal lobulated and infiltrative appearing soft tissue along the deep posterior left scalp convexity might be in part abnormal left level 5 lymph nodes. - PET scan (01/23/2024): Hypermetabolic enlargement of the left muscle of mastication just beneath the zygomatic arch with hypermetabolic left level 2, level 3, left posterior triangle lymph nodes.  Less well-defined hypermetabolic tissue within the right parotid gland.  Moderate hypermetabolic inguinal nodes with normal morphology are indeterminate.  1.8 x 1.4 spiculated left hilar mass encasing the left lower lobe superior segmental bronchus at its origin. - Left facial mass biopsy (02/07/2024): Follicular lymphoma, favoring classic type.  Proliferative rate by Ki-67 is low. - FLIPI score: 2 (age> 60, stage III/IV), intermediate high risk with 2-year OS 94%, 5-year OS 78% and 10-year OS 51%. - Cycle 1 Bendamustine  and obinutuzumab  (GALLIUM trial) started on 03/03/2024   2. 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 Quit smoking 10-12  years ago of 0.5 to 0.75 ppd since age 49. Asbestos exposure in the [employer]. AFFF exposure. -Mother died of lymphoma. No other family history of cancer. No family history of blood clots.    Plan: 1.  Stage IVB classical follicular lymphoma: - She received cycle 3 of Bendamustine  and obinutuzumab  on 04/28/2024. - Denied any GI side effects.  Denied any infections.  No falls reported. - He is continuing to work 4 days a week. - Labs today: CKD stable with creatinine 1.57.  LFTs are normal.  CBC grossly normal with anemia from myelosuppression. - Proceed with cycle 4 today.  RTC 4 weeks for follow-up with repeat PET scan to evaluate response.   2.  ID prophylaxis: - Continue Bactrim  on Monday Wednesday and Friday.   3.  Left hilar mass, PET positive: - We will follow-up on repeat PET scan.  If the left hilar mass is still positive, will consider bronchoscopy and biopsy after completion of chemotherapy.   4.  Hypotension: - Dizziness has improved since midodrine  was started at last visit.  Denies any further falls. - Continue midodrine  5 mg 3 times daily.  Blood pressure today is 95/71.    Orders Placed This Encounter  Procedures   NM PET Image Restage (PS) Skull Base to Thigh (F-18 FDG)    Standing Status:   Future    Expected Date:   06/25/2024    Expiration Date:   05/26/2025    If indicated for the ordered procedure, I authorize the administration of a radiopharmaceutical per Radiology protocol:   Yes    Preferred imaging location?:   [facility]      I,Helena R Teague,acting as a Neurosurgeon for [employer], MD.,have documented all relevant documentation on the behalf of Paulett Boros, MD,as directed by  Paulett Boros, MD while in the presence of Paulett Boros, MD.  I, Paulett Boros MD, have reviewed the above documentation for accuracy and completeness, and I agree with the above.    Paulett Boros, MD   6/9/202510:27 AM  CHIEF COMPLAINT:    Diagnosis: follicular lymphoma    Cancer Staging  Follicular lymphoma (HCC) Staging form: Hodgkin and Non-Hodgkin Lymphoma, AJCC 8th Edition - Clinical stage from 02/18/2024: Stage IV (Follicular lymphoma) - Unsigned    Prior Therapy: none  Current Therapy:  Bendamustine  and obinutuzumab  (GALLIUM trial)    HISTORY OF PRESENT ILLNESS:   Oncology History  Follicular lymphoma (HCC)  02/18/2024 Initial Diagnosis   Follicular lymphoma (HCC)   03/03/2024 -  Chemotherapy   Patient is on Treatment Plan : NON-HODGKIN'S LYMPHOMA - FOLLICULAR INDUCTION Obinutuzumab  +  Bendamustine  q28d        INTERVAL HISTORY:   Shawn Fleming is a 68 y.o. male presenting to clinic today for follow up of follicular lymphoma. He was last seen by me on 04/28/24.  Today, he states that he is doing well overall. His appetite level is at 100%. His energy level is at 75%.  PAST MEDICAL HISTORY:   Past Medical History: Past Medical History:  Diagnosis Date   AKI (acute kidney injury) (HCC) 07/29/2020   Arthritis    Cancer (HCC)    Diabetes (HCC)    GERD (gastroesophageal reflux disease)    History of kidney stones    Hypercholesteremia    Hypertension    Migraine    Sleep apnea     Surgical History: Past Surgical History:  Procedure Laterality Date   ADENOIDECTOMY     APPENDECTOMY     CENTRAL VENOUS CATHETER INSERTION Left 04/20/2013   Procedure: INSERTION CENTRAL LINE LEFT SUBCLAVIAN;  Surgeon: Lovena Rubinstein, MD;  Location: [facility];  Service: General;  Laterality: Left;   INCISION AND DRAINAGE PERIRECTAL ABSCESS N/A 04/20/2013   Procedure: SHARP SURGICAL DEBRIDEMENT PERINEAL INFECTION;  Surgeon: Lovena Rubinstein, MD;  Location: [facility];  Service: General;  Laterality: N/A;   IR IMAGING GUIDED PORT INSERTION  02/28/2024   KNEE SURGERY Right    arthroscopic   SHOULDER SURGERY Bilateral    removal of bone.   TONSILLECTOMY      Social History: Social History   Socioeconomic History   Marital status:  Divorced    Spouse name: Not on file   Number of children: 6   Years of education: Not on file   Highest education level: Not on file  Occupational History   Occupation: Engineer, materials  Tobacco Use   Smoking status: Former    Current packs/day: 0.50    Average packs/day: 0.5 packs/day for 23.0 years (11.5 ttl pk-yrs)    Types: Cigarettes   Smokeless tobacco: Never  Vaping Use   Vaping status: Former  Substance and Sexual Activity   Alcohol use: Yes    Comment: rare   Drug use: No   Sexual activity: Not Currently    Birth control/protection: None  Other Topics Concern   Not on file  Social History Narrative   Not on file   Social Drivers of Health   Financial Resource Strain: Not on file  Food Insecurity: No Food Insecurity (02/05/2024)   Hunger Vital Sign    Worried About Running  Out of Food in the Last Year: Never true    Ran Out of Food in the Last Year: Never true  Transportation Needs: No Transportation Needs (02/05/2024)   PRAPARE - Administrator, Civil Service (Medical): No    Lack of Transportation (Non-Medical): No  Physical Activity: Not on file  Stress: Not on file  Social Connections: Patient Declined (01/31/2024)   Social Connection and Isolation Panel [NHANES]    Frequency of Communication with Friends and Family: Patient declined    Frequency of Social Gatherings with Friends and Family: Patient declined    Attends Religious Services: Patient declined    Database administrator or Organizations: Patient declined    Attends Banker Meetings: Patient declined    Marital Status: Patient declined  Intimate Partner Violence: Not At Risk (02/05/2024)   Humiliation, Afraid, Rape, and Kick questionnaire    Fear of Current or Ex-Partner: No    Emotionally Abused: No    Physically Abused: No    Sexually Abused: No    Family History: Family History  Problem Relation Age of Onset   Cancer Mother    Dementia Father    Colon cancer Neg  Hx    Rectal cancer Neg Hx    Stomach cancer Neg Hx    Esophageal cancer Neg Hx     Current Medications:  Current Outpatient Medications:    acyclovir  (ZOVIRAX ) 400 MG tablet, Take 1 tablet (400 mg total) by mouth daily., Disp: 30 tablet, Rfl: 5   alfuzosin  (UROXATRAL ) 10 MG 24 hr tablet, Take 10 mg by mouth daily., Disp: , Rfl:    allopurinol  (ZYLOPRIM ) 300 MG tablet, Take 1 tablet (300 mg total) by mouth daily., Disp: 30 tablet, Rfl: 0   ALPRAZolam  (XANAX ) 0.5 MG tablet, Take 0.5 mg by mouth 2 (two) times daily., Disp: , Rfl:    apixaban  (ELIQUIS ) 5 MG TABS tablet, Take 1 tablet (5 mg total) by mouth 2 (two) times daily. Please start around August 31, 2020 after you complete the initial starter pack, Disp: 60 tablet, Rfl: 3   Cholecalciferol (VITAMIN D3) 5000 units CAPS, Take 2 capsules by mouth daily. , Disp: , Rfl:    dexamethasone  (DECADRON ) 1 MG tablet, TAKE 1 TABLET BY MOUTH DAILY WITH BREAKFAST., Disp: 15 tablet, Rfl: 0   furosemide (LASIX) 40 MG tablet, Take 40 mg by mouth daily., Disp: , Rfl:    gabapentin (NEURONTIN) 600 MG tablet, Take 600 mg by mouth 3 (three) times daily., Disp: , Rfl:    hydrocortisone  (CORTEF ) 10 MG tablet, TAKE 1 TABLET DAILY AT 8 A.M. AND 1 TABLET DAILY AT NOON, Disp: 180 tablet, Rfl: 1   insulin  glargine (LANTUS ) 100 UNIT/ML Solostar Pen, Inject 50 Units into the skin daily., Disp: , Rfl:    lidocaine -prilocaine  (EMLA ) cream, Apply topically as needed (Place small amount to port site 30 minutes prior to port access.). Apply to affected area once, Disp: 30 g, Rfl: 3   midodrine  (PROAMATINE ) 5 MG tablet, Take 1 tablet (5 mg total) by mouth 3 (three) times daily with meals., Disp: 90 tablet, Rfl: 2   Multiple Vitamins-Minerals (MULTIVITAMIN PO), Take 1 tablet by mouth daily., Disp: , Rfl:    Omega-3 Fatty Acids (FISH OIL) 1200 MG CAPS, Take 1 capsule by mouth daily., Disp: , Rfl:    ondansetron  (ZOFRAN ) 8 MG tablet, Take 1 tablet (8 mg total) by mouth every  8 (eight) hours as needed for nausea or vomiting.  Start on the second day after chemotherapy. Then take as needed for nausea or vomiting., Disp: 30 tablet, Rfl: 1   Oxycodone  HCl 10 MG TABS, Take 10 mg by mouth 4 (four) times daily., Disp: , Rfl:    pantoprazole  (PROTONIX ) 40 MG tablet, Take 40 mg by mouth daily., Disp: , Rfl:    potassium chloride  (KLOR-CON ) 10 MEQ tablet, Take 10 mEq by mouth daily., Disp: , Rfl:    prochlorperazine  (COMPAZINE ) 10 MG tablet, Take 1 tablet (10 mg total) by mouth every 6 (six) hours as needed for nausea or vomiting., Disp: 30 tablet, Rfl: 1   rosuvastatin  (CRESTOR ) 10 MG tablet, Take 10 mg by mouth daily., Disp: , Rfl:    sulfamethoxazole -trimethoprim  (BACTRIM  DS) 800-160 MG tablet, Take 1 tablet by mouth 3 (three) times a week., Disp: 12 tablet, Rfl: 5   tirzepatide  (MOUNJARO ) 12.5 MG/0.5ML Pen, Inject 12.5 mg into the skin once a week., Disp: 6 mL, Rfl: 0   tiZANidine (ZANAFLEX) 2 MG tablet, Take 2 mg by mouth 2 (two) times daily as needed., Disp: , Rfl:    Allergies: Allergies  Allergen Reactions   Prednisone  Other (See Comments)    Lightheadness and dizziness    REVIEW OF SYSTEMS:   Review of Systems  Constitutional:  Negative for chills, fatigue and fever.  HENT:   Negative for lump/mass, mouth sores, nosebleeds, sore throat and trouble swallowing.   Eyes:  Negative for eye problems.  Respiratory:  Positive for shortness of breath (On exertion). Negative for cough.   Cardiovascular:  Negative for chest pain, leg swelling and palpitations.  Gastrointestinal:  Negative for abdominal pain, constipation, diarrhea, nausea and vomiting.  Genitourinary:  Negative for bladder incontinence, difficulty urinating, dysuria, frequency, hematuria and nocturia.   Musculoskeletal:  Negative for arthralgias, back pain, flank pain, myalgias and neck pain.  Skin:  Negative for itching and rash.  Neurological:  Negative for dizziness, headaches and numbness.   Hematological:  Does not bruise/bleed easily.  Psychiatric/Behavioral:  Negative for depression, sleep disturbance and suicidal ideas. The patient is not nervous/anxious.   All other systems reviewed and are negative.    VITALS:   Pulse 100.  Wt Readings from Last 3 Encounters:  05/26/24 223 lb 5.2 oz (101.3 kg)  04/28/24 217 lb (98.4 kg)  04/10/24 217 lb (98.4 kg)    There is no height or weight on file to calculate BMI.  Performance status (ECOG): 1 - Symptomatic but completely ambulatory  PHYSICAL EXAM:   Physical Exam Vitals and nursing note reviewed. Exam conducted with a chaperone present.  Constitutional:      Appearance: Normal appearance.  Cardiovascular:     Rate and Rhythm: Normal rate and regular rhythm.     Pulses: Normal pulses.     Heart sounds: Normal heart sounds.  Pulmonary:     Effort: Pulmonary effort is normal.     Breath sounds: Normal breath sounds.  Abdominal:     Palpations: Abdomen is soft. There is no hepatomegaly, splenomegaly or mass.     Tenderness: There is no abdominal tenderness.  Musculoskeletal:     Right lower leg: No edema.     Left lower leg: No edema.  Lymphadenopathy:     Cervical: No cervical adenopathy.     Right cervical: No superficial, deep or posterior cervical adenopathy.    Left cervical: No superficial, deep or posterior cervical adenopathy.     Upper Body:     Right upper body: No supraclavicular  or axillary adenopathy.     Left upper body: No supraclavicular or axillary adenopathy.  Neurological:     General: No focal deficit present.     Mental Status: He is alert and oriented to person, place, and time.  Psychiatric:        Mood and Affect: Mood normal.        Behavior: Behavior normal.     LABS:   CBC     Component Value Date/Time   WBC 5.3 05/26/2024 0919   RBC 3.42 (L) 05/26/2024 0919   HGB 10.8 (L) 05/26/2024 0919   HCT 32.3 (L) 05/26/2024 0919   PLT 173 05/26/2024 0919   MCV 94.4 05/26/2024 0919    MCH 31.6 05/26/2024 0919   MCHC 33.4 05/26/2024 0919   RDW 15.5 05/26/2024 0919   LYMPHSABS 1.1 05/26/2024 0919   MONOABS 0.9 05/26/2024 0919   EOSABS 0.2 05/26/2024 0919   BASOSABS 0.1 05/26/2024 0919    CMP      Component Value Date/Time   NA 138 05/26/2024 0919   NA 143 01/22/2024 1307   K 4.7 05/26/2024 0919   CL 102 05/26/2024 0919   CO2 27 05/26/2024 0919   GLUCOSE 162 (H) 05/26/2024 0919   BUN 21 05/26/2024 0919   BUN 17 01/22/2024 1307   CREATININE 1.57 (H) 05/26/2024 0919   CALCIUM  9.2 05/26/2024 0919   PROT 6.3 (L) 05/26/2024 0919   PROT 7.0 01/22/2024 1307   ALBUMIN 3.6 05/26/2024 0919   ALBUMIN 4.5 01/22/2024 1307   AST 20 05/26/2024 0919   ALT 13 05/26/2024 0919   ALKPHOS 57 05/26/2024 0919   BILITOT 0.5 05/26/2024 0919   BILITOT 0.3 01/22/2024 1307   GFRNONAA 48 (L) 05/26/2024 0919   GFRAA 59 (L) 08/01/2020 0758     No results found for: "CEA1", "CEA" / No results found for: "CEA1", "CEA" No results found for: "PSA1" No results found for: "ZOX096" No results found for: "CAN125"  No results found for: "TOTALPROTELP", "ALBUMINELP", "A1GS", "A2GS", "BETS", "BETA2SER", "GAMS", "MSPIKE", "SPEI" No results found for: "TIBC", "FERRITIN", "IRONPCTSAT" Lab Results  Component Value Date   LDH 109 02/18/2024   LDH 116 01/15/2024     STUDIES:   No results found.

## 2024-05-26 NOTE — Patient Instructions (Signed)
 [facility] [place] - A DEPT OF [place]  Discharge Instructions: Thank you for choosing [facility] to provide your oncology and hematology care.  If you have a lab appointment with the Cancer Center - please note that after April 8th, 2024, all labs will be drawn in the cancer center.  You do not have to check in or register with the main entrance as you have in the past but will complete your check-in in the cancer center.  Wear comfortable clothing and clothing appropriate for easy access to any Portacath or PICC line.   We strive to give you quality time with your provider. You may need to reschedule your appointment if you arrive late (15 or more minutes).  Arriving late affects you and other patients whose appointments are after yours.  Also, if you miss three or more appointments without notifying the office, you may be dismissed from the clinic at the provider's discretion.      For prescription refill requests, have your pharmacy contact our office and allow 72 hours for refills to be completed.    Today you received the following chemotherapy and/or immunotherapy agents Gazyva  Bendeka       To help prevent nausea and vomiting after your treatment, we encourage you to take your nausea medication as directed.  BELOW ARE SYMPTOMS THAT SHOULD BE REPORTED IMMEDIATELY: *FEVER GREATER THAN 100.4 F (38 C) OR HIGHER *CHILLS OR SWEATING *NAUSEA AND VOMITING THAT IS NOT CONTROLLED WITH YOUR NAUSEA MEDICATION *UNUSUAL SHORTNESS OF BREATH *UNUSUAL BRUISING OR BLEEDING *URINARY PROBLEMS (pain or burning when urinating, or frequent urination) *BOWEL PROBLEMS (unusual diarrhea, constipation, pain near the anus) TENDERNESS IN MOUTH AND THROAT WITH OR WITHOUT PRESENCE OF ULCERS (sore throat, sores in mouth, or a toothache) UNUSUAL RASH, SWELLING OR PAIN  UNUSUAL VAGINAL DISCHARGE OR ITCHING   Items with * indicate a potential emergency and should be  followed up as soon as possible or go to the Emergency Department if any problems should occur.  Please show the CHEMOTHERAPY ALERT CARD or IMMUNOTHERAPY ALERT CARD at check-in to the Emergency Department and triage nurse.  Should you have questions after your visit or need to cancel or reschedule your appointment, please contact [facility] [place] - A DEPT OF [facility] [phone]  and follow the prompts.  Office hours are 8:00 a.m. to 4:30 p.m. Monday - Friday. Please note that voicemails left after 4:00 p.m. may not be returned until the following business day.  We are closed weekends and major holidays. You have access to a nurse at all times for urgent questions. Please call the main number to the clinic [phone] and follow the prompts.  For any non-urgent questions, you may also contact your provider using MyChart. We now offer e-Visits for anyone 22 and older to request care online for non-urgent symptoms. For details visit [email or web address].   Also download the MyChart app! Go to the app store, search "MyChart", open the app, select [place], and log in with your MyChart username and password.

## 2024-05-26 NOTE — Progress Notes (Signed)
 Patient has been examined by Dr. Cheree Cords. Vital signs (HR 107) and labs have been reviewed by MD - ANC, creatinine (1.57), LFTs, hemoglobin, and platelets are within treatment parameters per M.D. - pt may proceed with treatment.  Primary RN and pharmacy notified.

## 2024-05-26 NOTE — Progress Notes (Signed)
 Patient presents today for treatment and follow up visit with Dr. Cheree Cords. Creatinine 1.57, and heart rate 107.   Message received from A.Alva Jewels RN / Dr. Katragadda to proceed with treatment. Labs reviewed and MD aware of creatinine 1.57 and heart rate 107.   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  100 mg/hr and increase by 100 mg/hr increments every 30 min to a max of 400 mg/hr.

## 2024-05-26 NOTE — Progress Notes (Signed)
 Decrease lorazepam  to 0.25 mg IV push x 1 prior to chemotherapy today and for all future doses.  V.O. Dr. Davina Ester, PharmD

## 2024-05-26 NOTE — Patient Instructions (Addendum)
 [facility] at [facility] Discharge Instructions   You were seen and examined today by Dr. Cheree Cords.  He reviewed the results of your lab work which are normal/stable.   Call the pharmacy and ask them to refill your Bactrim  (the three times a day pill).   We will proceed with your treatment today.   Return as scheduled.    Thank you for choosing [facility] at [facility] to provide your oncology and hematology care.  To afford each patient quality time with our provider, please arrive at least 15 minutes before your scheduled appointment time.   If you have a lab appointment with the Cancer Center please come in thru the Main Entrance and check in at the main information desk.  You need to re-schedule your appointment should you arrive 10 or more minutes late.  We strive to give you quality time with our providers, and arriving late affects you and other patients whose appointments are after yours.  Also, if you no show three or more times for appointments you may be dismissed from the clinic at the providers discretion.     Again, thank you for choosing [facility].  Our hope is that these requests will decrease the amount of time that you wait before being seen by our physicians.       _____________________________________________________________  Should you have questions after your visit to [facility], please contact our office at [phone] and follow the prompts.  Our office hours are 8:00 a.m. and 4:30 p.m. Monday - Friday.  Please note that voicemails left after 4:00 p.m. may not be returned until the following business day.  We are closed weekends and major holidays.  You do have access to a nurse 24-7, just call the main number to the clinic [phone] and do not press any options, hold on the line and a nurse will answer the phone.    For prescription refill requests, have your pharmacy contact our  office and allow 72 hours.    Due to Covid, you will need to wear a mask upon entering the hospital. If you do not have a mask, a mask will be given to you at the Main Entrance upon arrival. For doctor visits, patients may have 1 support person age 71 or older with them. For treatment visits, patients can not have anyone with them due to social distancing guidelines and our immunocompromised population.

## 2024-05-26 NOTE — Progress Notes (Signed)
 Treatment given today per MD orders.  Stable during infusion without adverse affects.  Vital signs stable.  No complaints at this time.  Discharge from clinic ambulatory in stable condition.  Alert and oriented X 3.  Follow up with [facility] as scheduled.

## 2024-05-27 ENCOUNTER — Inpatient Hospital Stay

## 2024-05-27 VITALS — BP 124/71 | HR 91 | Temp 97.6°F | Resp 18

## 2024-05-27 DIAGNOSIS — C8259 Diffuse follicle center lymphoma, extranodal and solid organ sites: Secondary | ICD-10-CM

## 2024-05-27 DIAGNOSIS — Z5112 Encounter for antineoplastic immunotherapy: Secondary | ICD-10-CM | POA: Diagnosis not present

## 2024-05-27 MED ORDER — SODIUM CHLORIDE 0.9% FLUSH: 10.0000 mL | INTRAVENOUS | Status: DC | PRN

## 2024-05-27 MED ORDER — DEXAMETHASONE SODIUM PHOSPHATE 10 MG/ML IJ SOLN
10.0000 mg | Freq: Once | INTRAMUSCULAR | Status: AC
  Administered 2024-05-27: 10 mg via INTRAVENOUS
  Filled 2024-05-27: qty 1

## 2024-05-27 MED ORDER — SODIUM CHLORIDE 0.9 % IV SOLN: INTRAVENOUS | Status: DC

## 2024-05-27 MED ORDER — HEPARIN SOD (PORK) LOCK FLUSH 100 UNIT/ML IV SOLN
500.0000 [IU] | Freq: Once | INTRAVENOUS | Status: AC | PRN
  Administered 2024-05-27: 500 [IU]

## 2024-05-27 MED ORDER — SODIUM CHLORIDE 0.9 % IV SOLN
70.0000 mg/m2 | Freq: Once | INTRAVENOUS | Status: AC
  Administered 2024-05-27: 150 mg via INTRAVENOUS
  Filled 2024-05-27: qty 6

## 2024-05-27 NOTE — Patient Instructions (Signed)
 [facility] [place] - A DEPT OF [place]. [facility]  Discharge Instructions: Thank you for choosing [facility] to provide your oncology and hematology care.  If you have a lab appointment with the Cancer Center - please note that after April 8th, 2024, all labs will be drawn in the cancer center.  You do not have to check in or register with the main entrance as you have in the past but will complete your check-in in the cancer center.  Wear comfortable clothing and clothing appropriate for easy access to any Portacath or PICC line.   We strive to give you quality time with your provider. You may need to reschedule your appointment if you arrive late (15 or more minutes).  Arriving late affects you and other patients whose appointments are after yours.  Also, if you miss three or more appointments without notifying the office, you may be dismissed from the clinic at the provider's discretion.      For prescription refill requests, have your pharmacy contact our office and allow 72 hours for refills to be completed.    Today you received the following chemotherapy and/or immunotherapy agents bendeka     To help prevent nausea and vomiting after your treatment, we encourage you to take your nausea medication as directed.  BELOW ARE SYMPTOMS THAT SHOULD BE REPORTED IMMEDIATELY: *FEVER GREATER THAN 100.4 F (38 C) OR HIGHER *CHILLS OR SWEATING *NAUSEA AND VOMITING THAT IS NOT CONTROLLED WITH YOUR NAUSEA MEDICATION *UNUSUAL SHORTNESS OF BREATH *UNUSUAL BRUISING OR BLEEDING *URINARY PROBLEMS (pain or burning when urinating, or frequent urination) *BOWEL PROBLEMS (unusual diarrhea, constipation, pain near the anus) TENDERNESS IN MOUTH AND THROAT WITH OR WITHOUT PRESENCE OF ULCERS (sore throat, sores in mouth, or a toothache) UNUSUAL RASH, SWELLING OR PAIN  UNUSUAL VAGINAL DISCHARGE OR ITCHING   Items with * indicate a potential emergency and should be followed up as  soon as possible or go to the Emergency Department if any problems should occur.  Please show the CHEMOTHERAPY ALERT CARD or IMMUNOTHERAPY ALERT CARD at check-in to the Emergency Department and triage nurse.  Should you have questions after your visit or need to cancel or reschedule your appointment, please contact [facility] [place] - A DEPT OF [facility] [phone]  and follow the prompts.  Office hours are 8:00 a.m. to 4:30 p.m. Monday - Friday. Please note that voicemails left after 4:00 p.m. may not be returned until the following business day.  We are closed weekends and major holidays. You have access to a nurse at all times for urgent questions. Please call the main number to the clinic [phone] and follow the prompts.  For any non-urgent questions, you may also contact your provider using MyChart. We now offer e-Visits for anyone 53 and older to request care online for non-urgent symptoms. For details visit [email or web address].   Also download the MyChart app! Go to the app store, search "MyChart", open the app, select [place], and log in with your MyChart username and password.

## 2024-05-27 NOTE — Progress Notes (Signed)
 Patient tolerated chemotherapy with no complaints voiced.  Side effects with management reviewed with understanding verbalized.  Port site clean and dry with no bruising or swelling noted at site.  Good blood return noted before and after administration of chemotherapy.  Band aid applied.  Patient left in satisfactory condition with VSS and no s/s of distress noted. All follow ups as scheduled.   Venkat Ankney [employer]

## 2024-05-28 ENCOUNTER — Inpatient Hospital Stay

## 2024-05-28 VITALS — BP 107/70 | HR 107 | Temp 98.2°F | Resp 20

## 2024-05-28 DIAGNOSIS — C8259 Diffuse follicle center lymphoma, extranodal and solid organ sites: Secondary | ICD-10-CM

## 2024-05-28 DIAGNOSIS — Z5112 Encounter for antineoplastic immunotherapy: Secondary | ICD-10-CM | POA: Diagnosis not present

## 2024-05-28 MED ORDER — PEGFILGRASTIM-FPGK 6 MG/0.6ML ~~LOC~~ SOSY
6.0000 mg | PREFILLED_SYRINGE | Freq: Once | SUBCUTANEOUS | Status: AC
  Administered 2024-05-28: 6 mg via SUBCUTANEOUS
  Filled 2024-05-28: qty 0.6

## 2024-05-28 NOTE — Progress Notes (Signed)
 Shawn Fleming presents today for Stimufend  injection per the provider's orders.  Stable during administration without incident; injection site WNL; see MAR for injection details.  Patient tolerated procedure well and without incident.  No questions or complaints noted at this time.

## 2024-05-28 NOTE — Patient Instructions (Signed)
 [facility] [place] - A DEPT OF [facility]  Discharge Instructions: Thank you for choosing [facility] to provide your oncology and hematology care.  If you have a lab appointment with the Cancer Center - please note that after April 8th, 2024, all labs will be drawn in the cancer center.  You do not have to check in or register with the main entrance as you have in the past but will complete your check-in in the cancer center.  Wear comfortable clothing and clothing appropriate for easy access to any Portacath or PICC line.   We strive to give you quality time with your provider. You may need to reschedule your appointment if you arrive late (15 or more minutes).  Arriving late affects you and other patients whose appointments are after yours.  Also, if you miss three or more appointments without notifying the office, you may be dismissed from the clinic at the provider's discretion.      For prescription refill requests, have your pharmacy contact our office and allow 72 hours for refills to be completed.    Today you received the following chemotherapy and/or immunotherapy agents Stimufend      To help prevent nausea and vomiting after your treatment, we encourage you to take your nausea medication as directed.  BELOW ARE SYMPTOMS THAT SHOULD BE REPORTED IMMEDIATELY: *FEVER GREATER THAN 100.4 F (38 C) OR HIGHER *CHILLS OR SWEATING *NAUSEA AND VOMITING THAT IS NOT CONTROLLED WITH YOUR NAUSEA MEDICATION *UNUSUAL SHORTNESS OF BREATH *UNUSUAL BRUISING OR BLEEDING *URINARY PROBLEMS (pain or burning when urinating, or frequent urination) *BOWEL PROBLEMS (unusual diarrhea, constipation, pain near the anus) TENDERNESS IN MOUTH AND THROAT WITH OR WITHOUT PRESENCE OF ULCERS (sore throat, sores in mouth, or a toothache) UNUSUAL RASH, SWELLING OR PAIN  UNUSUAL VAGINAL DISCHARGE OR ITCHING   Items with * indicate a potential emergency and should be followed up  as soon as possible or go to the Emergency Department if any problems should occur.  Please show the CHEMOTHERAPY ALERT CARD or IMMUNOTHERAPY ALERT CARD at check-in to the Emergency Department and triage nurse.  Should you have questions after your visit or need to cancel or reschedule your appointment, please contact [facility] [place] - A DEPT OF [facility] [phone]  and follow the prompts.  Office hours are 8:00 a.m. to 4:30 p.m. Monday - Friday. Please note that voicemails left after 4:00 p.m. may not be returned until the following business day.  We are closed weekends and major holidays. You have access to a nurse at all times for urgent questions. Please call the main number to the clinic [phone] and follow the prompts.  For any non-urgent questions, you may also contact your provider using MyChart. We now offer e-Visits for anyone 3 and older to request care online for non-urgent symptoms. For details visit [email or web address].   Also download the MyChart app! Go to the app store, search "MyChart", open the app, select [place], and log in with your MyChart username and password.

## 2024-06-09 ENCOUNTER — Encounter: Payer: Self-pay | Admitting: Hematology

## 2024-06-12 ENCOUNTER — Encounter (HOSPITAL_COMMUNITY)
Admission: RE | Admit: 2024-06-12 | Discharge: 2024-06-12 | Disposition: A | Discharge status: Home / Self Care | Source: Ambulatory Visit | Attending: Hematology | Admitting: Hematology

## 2024-06-12 DIAGNOSIS — C8259 Diffuse follicle center lymphoma, extranodal and solid organ sites: Secondary | ICD-10-CM | POA: Insufficient documentation

## 2024-06-19 ENCOUNTER — Encounter (HOSPITAL_COMMUNITY)
Admission: RE | Admit: 2024-06-19 | Discharge: 2024-06-19 | Disposition: A | Discharge status: Home / Self Care | Source: Ambulatory Visit | Attending: Hematology | Admitting: Hematology

## 2024-06-19 DIAGNOSIS — C8259 Diffuse follicle center lymphoma, extranodal and solid organ sites: Secondary | ICD-10-CM | POA: Diagnosis present

## 2024-06-19 MED ORDER — FLUDEOXYGLUCOSE F - 18 (FDG) INJECTION
11.6400 | Freq: Once | INTRAVENOUS | Status: AC | PRN
  Administered 2024-06-19: 11.64 via INTRAVENOUS

## 2024-06-23 ENCOUNTER — Inpatient Hospital Stay: Attending: Hematology

## 2024-06-23 ENCOUNTER — Inpatient Hospital Stay (HOSPITAL_BASED_OUTPATIENT_CLINIC_OR_DEPARTMENT_OTHER): Admitting: Hematology

## 2024-06-23 ENCOUNTER — Inpatient Hospital Stay

## 2024-06-23 VITALS — BP 107/75 | HR 89 | Temp 97.9°F | Resp 16

## 2024-06-23 VITALS — Wt 222.0 lb

## 2024-06-23 DIAGNOSIS — C8259 Diffuse follicle center lymphoma, extranodal and solid organ sites: Secondary | ICD-10-CM

## 2024-06-23 DIAGNOSIS — I959 Hypotension, unspecified: Secondary | ICD-10-CM | POA: Diagnosis not present

## 2024-06-23 DIAGNOSIS — Z5189 Encounter for other specified aftercare: Secondary | ICD-10-CM | POA: Insufficient documentation

## 2024-06-23 DIAGNOSIS — Z5111 Encounter for antineoplastic chemotherapy: Secondary | ICD-10-CM | POA: Insufficient documentation

## 2024-06-23 DIAGNOSIS — C8299 Follicular lymphoma, unspecified, extranodal and solid organ sites: Secondary | ICD-10-CM | POA: Insufficient documentation

## 2024-06-23 DIAGNOSIS — Z5112 Encounter for antineoplastic immunotherapy: Secondary | ICD-10-CM | POA: Insufficient documentation

## 2024-06-23 LAB — CBC WITH DIFFERENTIAL/PLATELET
Abs Immature Granulocytes: 0.01 K/uL (ref 0.00–0.07)
Basophils Absolute: 0 K/uL (ref 0.0–0.1)
Basophils Relative: 1 %
Eosinophils Absolute: 0.3 K/uL (ref 0.0–0.5)
Eosinophils Relative: 8 %
HCT: 33.1 % — ABNORMAL LOW (ref 39.0–52.0)
Hemoglobin: 11.2 g/dL — ABNORMAL LOW (ref 13.0–17.0)
Immature Granulocytes: 0 %
Lymphocytes Relative: 20 %
Lymphs Abs: 0.7 K/uL (ref 0.7–4.0)
MCH: 31.5 pg (ref 26.0–34.0)
MCHC: 33.8 g/dL (ref 30.0–36.0)
MCV: 93 fL (ref 80.0–100.0)
Monocytes Absolute: 0.8 K/uL (ref 0.1–1.0)
Monocytes Relative: 26 %
Neutro Abs: 1.4 K/uL — ABNORMAL LOW (ref 1.7–7.7)
Neutrophils Relative %: 45 %
Platelets: 152 K/uL (ref 150–400)
RBC: 3.56 MIL/uL — ABNORMAL LOW (ref 4.22–5.81)
RDW: 13.3 % (ref 11.5–15.5)
WBC: 3.2 K/uL — ABNORMAL LOW (ref 4.0–10.5)
nRBC: 0 % (ref 0.0–0.2)

## 2024-06-23 LAB — COMPREHENSIVE METABOLIC PANEL WITH GFR
ALT: 12 U/L (ref 0–44)
AST: 17 U/L (ref 15–41)
Albumin: 3.7 g/dL (ref 3.5–5.0)
Alkaline Phosphatase: 63 U/L (ref 38–126)
Anion gap: 8 (ref 5–15)
BUN: 21 mg/dL (ref 8–23)
CO2: 28 mmol/L (ref 22–32)
Calcium: 9 mg/dL (ref 8.9–10.3)
Chloride: 101 mmol/L (ref 98–111)
Creatinine, Ser: 1.44 mg/dL — ABNORMAL HIGH (ref 0.61–1.24)
GFR, Estimated: 53 mL/min — ABNORMAL LOW (ref >60)
Glucose, Bld: 195 mg/dL — ABNORMAL HIGH (ref 70–99)
Potassium: 4.7 mmol/L (ref 3.5–5.1)
Sodium: 137 mmol/L (ref 135–145)
Total Bilirubin: 0.6 mg/dL (ref 0.0–1.2)
Total Protein: 6.4 g/dL — ABNORMAL LOW (ref 6.5–8.1)

## 2024-06-23 LAB — URIC ACID: Uric Acid, Serum: 6.5 mg/dL (ref 3.7–8.6)

## 2024-06-23 LAB — MAGNESIUM: Magnesium: 2.1 mg/dL (ref 1.7–2.4)

## 2024-06-23 MED ORDER — MIDODRINE HCL 5 MG PO TABS: 5.0000 mg | ORAL_TABLET | Freq: Three times a day (TID) | ORAL | 2 refills | Status: DC

## 2024-06-23 MED ORDER — SODIUM CHLORIDE 0.9 % IV SOLN
70.0000 mg/m2 | Freq: Once | INTRAVENOUS | Status: AC
  Administered 2024-06-23: 150 mg via INTRAVENOUS
  Filled 2024-06-23: qty 6

## 2024-06-23 MED ORDER — SODIUM CHLORIDE 0.9 % IV SOLN
1000.0000 mg | Freq: Once | INTRAVENOUS | Status: AC
  Administered 2024-06-23: 1000 mg via INTRAVENOUS
  Filled 2024-06-23: qty 40

## 2024-06-23 MED ORDER — SODIUM CHLORIDE 0.9 % IV SOLN: INTRAVENOUS | Status: DC

## 2024-06-23 MED ORDER — PALONOSETRON HCL INJECTION 0.25 MG/5ML
0.2500 mg | Freq: Once | INTRAVENOUS | Status: AC
  Administered 2024-06-23: 0.25 mg via INTRAVENOUS
  Filled 2024-06-23: qty 5

## 2024-06-23 MED ORDER — SODIUM CHLORIDE 0.9 % IV SOLN
20.0000 mg | Freq: Once | INTRAVENOUS | Status: AC
  Administered 2024-06-23: 20 mg via INTRAVENOUS
  Filled 2024-06-23: qty 2

## 2024-06-23 MED ORDER — LORAZEPAM 2 MG/ML IJ SOLN
0.2500 mg | Freq: Once | INTRAMUSCULAR | Status: AC
  Administered 2024-06-23: 0.25 mg via INTRAVENOUS
  Filled 2024-06-23: qty 1

## 2024-06-23 MED ORDER — HEPARIN SOD (PORK) LOCK FLUSH 100 UNIT/ML IV SOLN
500.0000 [IU] | Freq: Once | INTRAVENOUS | Status: AC | PRN
Start: 2024-06-23 — End: 2024-06-23
  Administered 2024-06-23: 500 [IU]

## 2024-06-23 MED ORDER — CETIRIZINE HCL 10 MG/ML IV SOLN
10.0000 mg | Freq: Once | INTRAVENOUS | Status: AC
  Administered 2024-06-23: 10 mg via INTRAVENOUS
  Filled 2024-06-23: qty 1

## 2024-06-23 MED ORDER — ACETAMINOPHEN 325 MG PO TABS
650.0000 mg | ORAL_TABLET | Freq: Once | ORAL | Status: AC
  Administered 2024-06-23: 650 mg via ORAL
  Filled 2024-06-23: qty 2

## 2024-06-23 MED ORDER — FAMOTIDINE IN NACL 20-0.9 MG/50ML-% IV SOLN
20.0000 mg | Freq: Once | INTRAVENOUS | Status: AC
  Administered 2024-06-23: 20 mg via INTRAVENOUS
  Filled 2024-06-23: qty 50

## 2024-06-23 NOTE — Progress Notes (Signed)
 [facility] [address] [place] [address]    Clinic Day:  06/23/2024  Referring physician: Shona Norleen PEDLAR, MD  Patient Care Team: Shona Norleen PEDLAR, MD as PCP - General (Internal Medicine) Fleming Hai, MD as Medical Oncologist (Medical Oncology) Celestia Joesph SQUIBB, RN as Oncology Nurse Navigator (Medical Oncology)   ASSESSMENT & PLAN:   Assessment: 1.  Left facial soft tissue mass: - Left facial mass for the last 4-5 months, gradually increasing.  No hearing loss/vision changes. - No fevers/night sweats.  57 pound weight loss in the last 2 years, has been on Mounjaro  for the last 1 and half year. - Ultrasound (11/30/2023): Complex 3.6 x 1.1 x 2.9 cm cystic structure in front of the left ear. - CT head (12/27/2022): Spiculated soft tissue mass around the left zygomatic arch measuring 2.2 cm in thickness and 4.3 cm in length.  Multifocal lobulated and infiltrative appearing soft tissue along the deep posterior left scalp convexity might be in part abnormal left level 5 lymph nodes. - PET scan (01/23/2024): Hypermetabolic enlargement of the left muscle of mastication just beneath the zygomatic arch with hypermetabolic left level 2, level 3, left posterior triangle lymph nodes.  Less well-defined hypermetabolic tissue within the right parotid gland.  Moderate hypermetabolic inguinal nodes with normal morphology are indeterminate.  1.8 x 1.4 spiculated left hilar mass encasing the left lower lobe superior segmental bronchus at its origin. - Left facial mass biopsy (02/07/2024): Follicular lymphoma, favoring classic type.  Proliferative rate by Ki-67 is low. - FLIPI score: 2 (age> 60, stage III/IV), intermediate high risk with 2-year OS 94%, 5-year OS 78% and 10-year OS 51%. - Cycle 1 Bendamustine  and obinutuzumab  (GALLIUM trial) started on 03/03/2024   2. 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 Quit smoking 10-12  years ago of 0.5 to 0.75 ppd since age 64. Asbestos exposure in the [employer]. AFFF exposure. -Mother died of lymphoma. No other family history of cancer. No family history of blood clots.    Plan: 1.  Stage IVB classical follicular lymphoma: - He received cycle 4 of Bendamustine  and obinutuzumab  on 05/26/2024. - Denies any GI side effects.  Denies any infections. - PET scan (06/19/2024): Complete metabolic response, Deauville 1. - Reviewed labs today: CBC with mild leukopenia and ANC of 1.4.  Platelet count is normal.  LFTs are normal.  Creatinine is 1.44. - He may proceed with cycle 5 today.  RTC on 07/21/2024 for cycle 6.  I will discuss maintenance obinutuzumab  1 g every 2 months for 2 years which has shown improvement in PFS but not OS and associated with increase in infection risk.   2.  ID prophylaxis: - Continue Bactrim  on Monday, Wednesday and Friday.   3.  Left hilar mass, PET positive: - PET scan on 06/19/2024 showed complete resolution of the left hilar mass indicating it is a lymphomatous process.   4.  Hypotension: - He denies any dizziness since midodrine  started.  Continue midodrine  5 mg 3 times daily.  Blood pressure today is 109/77.    No orders of the defined types were placed in this encounter.     LILLETTE Verneta SAUNDERS Teague,acting as a Neurosurgeon for Shawn Rogers, MD.,have documented all relevant documentation on the behalf of Shawn Rogers, MD,as directed by  Shawn Rogers, MD while in the presence of Shawn Rogers, MD.  I, Shawn Rogers MD, have reviewed the above documentation for accuracy and  completeness, and I agree with the above.     Alean Stands, MD   7/7/20259:41 AM  CHIEF COMPLAINT:   Diagnosis: follicular lymphoma    Cancer Staging  Follicular lymphoma (HCC) Staging form: Hodgkin and Non-Hodgkin Lymphoma, AJCC 8th Edition - Clinical stage from 02/18/2024: Stage IV (Follicular lymphoma) - Unsigned    Prior Therapy:  none  Current Therapy:  Bendamustine  and obinutuzumab  (GALLIUM trial)    HISTORY OF PRESENT ILLNESS:   Oncology History  Follicular lymphoma (HCC)  02/18/2024 Initial Diagnosis   Follicular lymphoma (HCC)   03/03/2024 -  Chemotherapy   Patient is on Treatment Plan : NON-HODGKIN'S LYMPHOMA - FOLLICULAR INDUCTION Obinutuzumab  +  Bendamustine  q28d        INTERVAL HISTORY:   Brevin is a 68 y.o. male presenting to clinic today for follow up of follicular lymphoma. He was last seen by me on 05/26/24.  Since his last vsiit, he underwent restaging PET on 06/19/24 that found: No evidence of recurrent or metastatic disease. Previously demonstrated hypermetabolic cervical and inguinal lymph nodes have resolved. Deauville 1. Aortic Atherosclerosis.  Today, he states that he is doing well overall. His appetite level is at 100%. His energy level is at 70%.  PAST MEDICAL HISTORY:   Past Medical History: Past Medical History:  Diagnosis Date   AKI (acute kidney injury) (HCC) 07/29/2020   Arthritis    Cancer (HCC)    Diabetes (HCC)    GERD (gastroesophageal reflux disease)    History of kidney stones    Hypercholesteremia    Hypertension    Migraine    Sleep apnea     Surgical History: Past Surgical History:  Procedure Laterality Date   ADENOIDECTOMY     APPENDECTOMY     CENTRAL VENOUS CATHETER INSERTION Left 04/20/2013   Procedure: INSERTION CENTRAL LINE LEFT SUBCLAVIAN;  Surgeon: Thresa JAYSON Pulling, MD;  Location: [facility];  Service: General;  Laterality: Left;   INCISION AND DRAINAGE PERIRECTAL ABSCESS N/A 04/20/2013   Procedure: SHARP SURGICAL DEBRIDEMENT PERINEAL INFECTION;  Surgeon: Thresa JAYSON Pulling, MD;  Location: [facility];  Service: General;  Laterality: N/A;   IR IMAGING GUIDED PORT INSERTION  02/28/2024   KNEE SURGERY Right    arthroscopic   SHOULDER SURGERY Bilateral    removal of bone.   TONSILLECTOMY      Social History: Social History   Socioeconomic History   Marital status:  Divorced    Spouse name: Not on file   Number of children: 6   Years of education: Not on file   Highest education level: Not on file  Occupational History   Occupation: Engineer, materials  Tobacco Use   Smoking status: Former    Current packs/day: 0.50    Average packs/day: 0.5 packs/day for 23.0 years (11.5 ttl pk-yrs)    Types: Cigarettes   Smokeless tobacco: Never  Vaping Use   Vaping status: Former  Substance and Sexual Activity   Alcohol use: Yes    Comment: rare   Drug use: No   Sexual activity: Not Currently    Birth control/protection: None  Other Topics Concern   Not on file  Social History Narrative   Not on file   Social Drivers of Health   Financial Resource Strain: Not on file  Food Insecurity: No Food Insecurity (02/05/2024)   Hunger Vital Sign    Worried About Running Out of Food in the Last Year: Never true    Ran Out of Food in the Last  Year: Never true  Transportation Needs: No Transportation Needs (02/05/2024)   PRAPARE - Administrator, Civil Service (Medical): No    Lack of Transportation (Non-Medical): No  Physical Activity: Not on file  Stress: Not on file  Social Connections: Patient Declined (01/31/2024)   Social Connection and Isolation Panel    Frequency of Communication with Friends and Family: Patient declined    Frequency of Social Gatherings with Friends and Family: Patient declined    Attends Religious Services: Patient declined    Database administrator or Organizations: Patient declined    Attends Banker Meetings: Patient declined    Marital Status: Patient declined  Intimate Partner Violence: Not At Risk (02/05/2024)   Humiliation, Afraid, Rape, and Kick questionnaire    Fear of Current or Ex-Partner: No    Emotionally Abused: No    Physically Abused: No    Sexually Abused: No    Family History: Family History  Problem Relation Age of Onset   Cancer Mother    Dementia Father    Colon cancer Neg Hx     Rectal cancer Neg Hx    Stomach cancer Neg Hx    Esophageal cancer Neg Hx     Current Medications:  Current Outpatient Medications:    acyclovir  (ZOVIRAX ) 400 MG tablet, Take 1 tablet (400 mg total) by mouth daily., Disp: 30 tablet, Rfl: 5   alfuzosin  (UROXATRAL ) 10 MG 24 hr tablet, Take 10 mg by mouth daily., Disp: , Rfl:    allopurinol  (ZYLOPRIM ) 300 MG tablet, Take 1 tablet (300 mg total) by mouth daily., Disp: 30 tablet, Rfl: 0   ALPRAZolam  (XANAX ) 0.5 MG tablet, Take 0.5 mg by mouth 2 (two) times daily., Disp: , Rfl:    apixaban  (ELIQUIS ) 5 MG TABS tablet, Take 1 tablet (5 mg total) by mouth 2 (two) times daily. Please start around August 31, 2020 after you complete the initial starter pack, Disp: 60 tablet, Rfl: 3   Cholecalciferol (VITAMIN D3) 5000 units CAPS, Take 2 capsules by mouth daily. , Disp: , Rfl:    dexamethasone  (DECADRON ) 1 MG tablet, TAKE 1 TABLET BY MOUTH DAILY WITH BREAKFAST., Disp: 15 tablet, Rfl: 0   furosemide (LASIX) 40 MG tablet, Take 40 mg by mouth daily., Disp: , Rfl:    gabapentin (NEURONTIN) 600 MG tablet, Take 600 mg by mouth 3 (three) times daily., Disp: , Rfl:    GVOKE HYPOPEN 2-PACK 1 MG/0.2ML SOAJ, , Disp: , Rfl:    hydrocortisone  (CORTEF ) 10 MG tablet, TAKE 1 TABLET DAILY AT 8 A.M. AND 1 TABLET DAILY AT NOON, Disp: 180 tablet, Rfl: 1   insulin  glargine (LANTUS ) 100 UNIT/ML Solostar Pen, Inject 50 Units into the skin daily., Disp: , Rfl:    lidocaine -prilocaine  (EMLA ) cream, Apply topically as needed (Place small amount to port site 30 minutes prior to port access.). Apply to affected area once, Disp: 30 g, Rfl: 3   Multiple Vitamins-Minerals (MULTIVITAMIN PO), Take 1 tablet by mouth daily., Disp: , Rfl:    Omega-3 Fatty Acids (FISH OIL) 1200 MG CAPS, Take 1 capsule by mouth daily., Disp: , Rfl:    ondansetron  (ZOFRAN ) 8 MG tablet, Take 1 tablet (8 mg total) by mouth every 8 (eight) hours as needed for nausea or vomiting. Start on the second day after  chemotherapy. Then take as needed for nausea or vomiting., Disp: 30 tablet, Rfl: 1   Oxycodone  HCl 10 MG TABS, Take 10 mg by mouth 4 (  four) times daily., Disp: , Rfl:    pantoprazole  (PROTONIX ) 40 MG tablet, Take 40 mg by mouth daily., Disp: , Rfl:    potassium chloride  (KLOR-CON ) 10 MEQ tablet, Take 10 mEq by mouth daily., Disp: , Rfl:    prochlorperazine  (COMPAZINE ) 10 MG tablet, Take 1 tablet (10 mg total) by mouth every 6 (six) hours as needed for nausea or vomiting., Disp: 30 tablet, Rfl: 1   rosuvastatin  (CRESTOR ) 10 MG tablet, Take 10 mg by mouth daily., Disp: , Rfl:    sulfamethoxazole -trimethoprim  (BACTRIM  DS) 800-160 MG tablet, Take 1 tablet by mouth 3 (three) times a week., Disp: 12 tablet, Rfl: 5   SUMAtriptan (IMITREX) 100 MG tablet, Take 100 mg by mouth as directed., Disp: , Rfl:    tirzepatide  (MOUNJARO ) 12.5 MG/0.5ML Pen, Inject 12.5 mg into the skin once a week., Disp: 6 mL, Rfl: 0   tiZANidine (ZANAFLEX) 2 MG tablet, Take 2 mg by mouth 2 (two) times daily as needed., Disp: , Rfl:    midodrine  (PROAMATINE ) 5 MG tablet, Take 1 tablet (5 mg total) by mouth 3 (three) times daily with meals., Disp: 90 tablet, Rfl: 2 No current facility-administered medications for this visit.  Facility-Administered Medications Ordered in Other Visits:    0.9 %  sodium chloride  infusion, , Intravenous, Continuous, Fleming Hai, MD, Last Rate: 10 mL/hr at 06/23/24 0920, New Bag at 06/23/24 0920   bendamustine  (BENDEKA ) 150 mg in sodium chloride  0.9 % 50 mL (2.6786 mg/mL) chemo infusion, 70 mg/m2 (Treatment Plan Recorded), Intravenous, Once, Aaliah Jorgenson, MD   dexamethasone  (DECADRON ) 20 mg in sodium chloride  0.9 % 50 mL IVPB, 20 mg, Intravenous, Once, Jaeveon Ashland, MD   famotidine  (PEPCID ) IVPB 20 mg premix, 20 mg, Intravenous, Once, Fleming Hai, MD, Last Rate: 200 mL/hr at 06/23/24 0934, 20 mg at 06/23/24 0934   heparin  lock flush 100 unit/mL, 500 Units, Intracatheter,  Once PRN, Brandye Inthavong, MD   obinutuzumab  (GAZYVA ) 1,000 mg in sodium chloride  0.9 % 250 mL (3.4483 mg/mL) chemo infusion, 1,000 mg, Intravenous, Once, Fleming Hai, MD   Allergies: Allergies  Allergen Reactions   Prednisone  Other (See Comments)    Lightheadness and dizziness    REVIEW OF SYSTEMS:   Review of Systems  Constitutional:  Negative for chills, fatigue and fever.  HENT:   Negative for lump/mass, mouth sores, nosebleeds, sore throat and trouble swallowing.   Eyes:  Negative for eye problems.  Respiratory:  Negative for cough and shortness of breath.   Cardiovascular:  Negative for chest pain, leg swelling and palpitations.  Gastrointestinal:  Negative for abdominal pain, constipation, diarrhea, nausea and vomiting.  Genitourinary:  Negative for bladder incontinence, difficulty urinating, dysuria, frequency, hematuria and nocturia.   Musculoskeletal:  Negative for arthralgias, back pain, flank pain, myalgias and neck pain.  Skin:  Negative for itching and rash.  Neurological:  Negative for dizziness, headaches and numbness.  Hematological:  Does not bruise/bleed easily.  Psychiatric/Behavioral:  Positive for sleep disturbance. Negative for depression and suicidal ideas. The patient is not nervous/anxious.   All other systems reviewed and are negative.    VITALS:   Weight 222 lb (100.7 kg).  Wt Readings from Last 3 Encounters:  06/23/24 222 lb (100.7 kg)  05/26/24 223 lb 5.2 oz (101.3 kg)  04/28/24 217 lb (98.4 kg)    Body mass index is 36.94 kg/m.  Performance status (ECOG): 1 - Symptomatic but completely ambulatory  PHYSICAL EXAM:   Physical Exam Vitals and nursing note reviewed.  Exam conducted with a chaperone present.  Constitutional:      Appearance: Normal appearance.  Cardiovascular:     Rate and Rhythm: Normal rate and regular rhythm.     Pulses: Normal pulses.     Heart sounds: Normal heart sounds.  Pulmonary:     Effort: Pulmonary  effort is normal.     Breath sounds: Normal breath sounds.  Abdominal:     Palpations: Abdomen is soft. There is no hepatomegaly, splenomegaly or mass.     Tenderness: There is no abdominal tenderness.  Musculoskeletal:     Right lower leg: No edema.     Left lower leg: No edema.  Lymphadenopathy:     Cervical: No cervical adenopathy.     Right cervical: No superficial, deep or posterior cervical adenopathy.    Left cervical: No superficial, deep or posterior cervical adenopathy.     Upper Body:     Right upper body: No supraclavicular or axillary adenopathy.     Left upper body: No supraclavicular or axillary adenopathy.  Neurological:     General: No focal deficit present.     Mental Status: He is alert and oriented to person, place, and time.  Psychiatric:        Mood and Affect: Mood normal.        Behavior: Behavior normal.     LABS:   CBC     Component Value Date/Time   WBC 3.2 (L) 06/23/2024 0741   RBC 3.56 (L) 06/23/2024 0741   HGB 11.2 (L) 06/23/2024 0741   HCT 33.1 (L) 06/23/2024 0741   PLT 152 06/23/2024 0741   MCV 93.0 06/23/2024 0741   MCH 31.5 06/23/2024 0741   MCHC 33.8 06/23/2024 0741   RDW 13.3 06/23/2024 0741   LYMPHSABS 0.7 06/23/2024 0741   MONOABS 0.8 06/23/2024 0741   EOSABS 0.3 06/23/2024 0741   BASOSABS 0.0 06/23/2024 0741    CMP      Component Value Date/Time   NA 137 06/23/2024 0741   NA 143 01/22/2024 1307   K 4.7 06/23/2024 0741   CL 101 06/23/2024 0741   CO2 28 06/23/2024 0741   GLUCOSE 195 (H) 06/23/2024 0741   BUN 21 06/23/2024 0741   BUN 17 01/22/2024 1307   CREATININE 1.44 (H) 06/23/2024 0741   CALCIUM  9.0 06/23/2024 0741   PROT 6.4 (L) 06/23/2024 0741   PROT 7.0 01/22/2024 1307   ALBUMIN 3.7 06/23/2024 0741   ALBUMIN 4.5 01/22/2024 1307   AST 17 06/23/2024 0741   ALT 12 06/23/2024 0741   ALKPHOS 63 06/23/2024 0741   BILITOT 0.6 06/23/2024 0741   BILITOT 0.3 01/22/2024 1307   GFRNONAA 53 (L) 06/23/2024 0741   GFRAA  59 (L) 08/01/2020 0758     No results found for: CEA1, CEA / No results found for: CEA1, CEA No results found for: PSA1 No results found for: CAN199 No results found for: CAN125  No results found for: TOTALPROTELP, ALBUMINELP, A1GS, A2GS, BETS, BETA2SER, GAMS, MSPIKE, SPEI No results found for: TIBC, FERRITIN, IRONPCTSAT Lab Results  Component Value Date   LDH 109 02/18/2024   LDH 116 01/15/2024     STUDIES:   NM PET Image Restage (PS) Skull Base to Thigh (F-18 FDG) Result Date: 06/20/2024 CLINICAL DATA:  Subsequent treatment strategy for hematologic malignancy (stage IV B follicular lymphoma). EXAM: NUCLEAR MEDICINE PET SKULL BASE TO THIGH TECHNIQUE: 11.64 mCi F-18 FDG was injected intravenously. Full-ring PET imaging was performed from the skull base  to thigh after the radiotracer. CT data was obtained and used for attenuation correction and anatomic localization. Fasting blood glucose: 112 mg/dl COMPARISON:  Chest CTA 01/30/2024.  PET-CT 01/23/2024 FINDINGS: Mediastinal blood pool activity: SUV max 2.1 Liver activity: SUV max 2.5 NECK: No residual enlarged or hypermetabolic cervical lymph nodes are identified. The previously enlarged intraparotid and left level 2 and 3 cervical lymph nodes have resolved. No suspicious activity identified within the pharyngeal mucosal space. There is physiologic muscular activity within the lower neck. Incidental CT findings: none CHEST: There are no hypermetabolic mediastinal, hilar or axillary lymph nodes. No hypermetabolic pulmonary activity or suspicious nodularity. Incidental CT findings: Right IJ Port-A-Cath extends to the superior cavoatrial junction. Mild aortic atherosclerosis. ABDOMEN/PELVIS: There is no hypermetabolic activity within the liver, adrenal glands, spleen or pancreas. There is no hypermetabolic nodal activity in the abdomen or pelvis. Previously demonstrated small hypermetabolic inguinal lymph nodes  bilaterally have resolved. No suspicious bowel activity. Incidental CT findings: Mild aortic atherosclerosis. Scattered distal colonic diverticulosis without evidence of acute inflammation. Stable asymmetric fat in the left inguinal canal. SKELETON: There is no hypermetabolic activity to suggest osseous metastatic disease. Incidental CT findings: Mild spondylosis. IMPRESSION: 1. No evidence of recurrent or metastatic disease. Previously demonstrated hypermetabolic cervical and inguinal lymph nodes have resolved. Deauville 1. 2.  Aortic Atherosclerosis (ICD10-I70.0). Electronically Signed   By: Elsie Perone M.D.   On: 06/20/2024 17:39

## 2024-06-23 NOTE — Patient Instructions (Signed)
 [facility] - A DEPT OF [facility]  Discharge Instructions: Thank you for choosing [facility] to provide your oncology and hematology care.  If you have a lab appointment with the Cancer Center - please note that after April 8th, 2024, all labs will be drawn in the cancer center.  You do not have to check in or register with the main entrance as you have in the past but will complete your check-in in the cancer center.  Wear comfortable clothing and clothing appropriate for easy access to any Portacath or PICC line.   We strive to give you quality time with your provider. You may need to reschedule your appointment if you arrive late (15 or more minutes).  Arriving late affects you and other patients whose appointments are after yours.  Also, if you miss three or more appointments without notifying the office, you may be dismissed from the clinic at the provider's discretion.      For prescription refill requests, have your pharmacy contact our office and allow 72 hours for refills to be completed.    Today you received the following chemotherapy and/or immunotherapy agents Gazyva and Bendeka   To help prevent nausea and vomiting after your treatment, we encourage you to take your nausea medication as directed.    BELOW ARE SYMPTOMS THAT SHOULD BE REPORTED IMMEDIATELY: *FEVER GREATER THAN 100.4 F (38 C) OR HIGHER *CHILLS OR SWEATING *NAUSEA AND VOMITING THAT IS NOT CONTROLLED WITH YOUR NAUSEA MEDICATION *UNUSUAL SHORTNESS OF BREATH *UNUSUAL BRUISING OR BLEEDING *URINARY PROBLEMS (pain or burning when urinating, or frequent urination) *BOWEL PROBLEMS (unusual diarrhea, constipation, pain near the anus) TENDERNESS IN MOUTH AND THROAT WITH OR WITHOUT PRESENCE OF ULCERS (sore throat, sores in mouth, or a toothache) UNUSUAL RASH, SWELLING OR PAIN  UNUSUAL VAGINAL DISCHARGE OR ITCHING   Items with * indicate a potential emergency and should be  followed up as soon as possible or go to the Emergency Department if any problems should occur.  Please show the CHEMOTHERAPY ALERT CARD or IMMUNOTHERAPY ALERT CARD at check-in to the Emergency Department and triage nurse.  Should you have questions after your visit or need to cancel or reschedule your appointment, please contact [facility] [place] - A DEPT OF [facility] [phone]  and follow the prompts.  Office hours are 8:00 a.m. to 4:30 p.m. Monday - Friday. Please note that voicemails left after 4:00 p.m. may not be returned until the following business day.  We are closed weekends and major holidays. You have access to a nurse at all times for urgent questions. Please call the main number to the clinic [phone] and follow the prompts.  For any non-urgent questions, you may also contact your provider using MyChart. We now offer e-Visits for anyone 54 and older to request care online for non-urgent symptoms. For details visit [email or web address].   Also download the MyChart app! Go to the app store, search "MyChart", open the app, select [place], and log in with your MyChart username and password.

## 2024-06-23 NOTE — Patient Instructions (Signed)
 [facility] at [facility] Discharge Instructions   You were seen and examined today by Dr. Rogers.  He reviewed the results of your lab work which are normal/stable.   He reviewed the results of your PET scan which did not show any evidence of lymphoma.   We will proceed with your treatment today.   Return as scheduled.    Thank you for choosing [facility] at [facility] to provide your oncology and hematology care.  To afford each patient quality time with our provider, please arrive at least 15 minutes before your scheduled appointment time.   If you have a lab appointment with the Cancer Center please come in thru the Main Entrance and check in at the main information desk.  You need to re-schedule your appointment should you arrive 10 or more minutes late.  We strive to give you quality time with our providers, and arriving late affects you and other patients whose appointments are after yours.  Also, if you no show three or more times for appointments you may be dismissed from the clinic at the providers discretion.     Again, thank you for choosing [facility]  Our hope is that these requests will decrease the amount of time that you wait before being seen by our physicians.       _____________________________________________________________  Should you have questions after your visit to [facility], please contact our office at [phone] and follow the prompts.  Our office hours are 8:00 a.m. and 4:30 p.m. Monday - Friday.  Please note that voicemails left after 4:00 p.m. may not be returned until the following business day.  We are closed weekends and major holidays.  You do have access to a nurse 24-7, just call the main number to the clinic [phone] and do not press any options, hold on the line and a nurse will answer the phone.    For prescription refill requests, have your pharmacy contact our  office and allow 72 hours.    Due to Covid, you will need to wear a mask upon entering the hospital. If you do not have a mask, a mask will be given to you at the Main Entrance upon arrival. For doctor visits, patients may have 1 support person age 46 or older with them. For treatment visits, patients can not have anyone with them due to social distancing guidelines and our immunocompromised population.

## 2024-06-23 NOTE — Progress Notes (Signed)
 Patient tolerated chemotherapy with no complaints voiced.  Side effects with management reviewed with understanding verbalized.  Port site clean and dry with no bruising or swelling noted at site.  Good blood return noted before and after administration of chemotherapy.  Port left accessed due to patient returning 06/24/24 for day two of treatment.  Patient left in satisfactory condition with VSS and no s/s of distress noted. All follow ups as scheduled.   Shawn Fleming

## 2024-06-23 NOTE — Progress Notes (Signed)
Patient has been examined by Dr. Katragadda. Vital signs and labs have been reviewed by MD - ANC (1.4), Creatinine, LFTs, hemoglobin, and platelets are within treatment parameters per M.D. - pt may proceed with treatment.  Primary RN and pharmacy notified.  

## 2024-06-24 ENCOUNTER — Inpatient Hospital Stay

## 2024-06-24 VITALS — BP 142/78 | HR 88 | Temp 96.7°F | Resp 18

## 2024-06-24 DIAGNOSIS — Z5112 Encounter for antineoplastic immunotherapy: Secondary | ICD-10-CM | POA: Diagnosis not present

## 2024-06-24 DIAGNOSIS — C8259 Diffuse follicle center lymphoma, extranodal and solid organ sites: Secondary | ICD-10-CM

## 2024-06-24 MED ORDER — SODIUM CHLORIDE 0.9 % IV SOLN
70.0000 mg/m2 | Freq: Once | INTRAVENOUS | Status: AC
  Administered 2024-06-24: 150 mg via INTRAVENOUS
  Filled 2024-06-24: qty 6

## 2024-06-24 MED ORDER — SODIUM CHLORIDE 0.9 % IV SOLN: INTRAVENOUS | Status: DC

## 2024-06-24 MED ORDER — SODIUM CHLORIDE 0.9% FLUSH
10.0000 mL | INTRAVENOUS | Status: DC | PRN
  Administered 2024-06-24: 10 mL

## 2024-06-24 MED ORDER — HEPARIN SOD (PORK) LOCK FLUSH 100 UNIT/ML IV SOLN
500.0000 [IU] | Freq: Once | INTRAVENOUS | Status: AC | PRN
  Administered 2024-06-24: 500 [IU]

## 2024-06-24 MED ORDER — DEXAMETHASONE SODIUM PHOSPHATE 10 MG/ML IJ SOLN
10.0000 mg | Freq: Once | INTRAMUSCULAR | Status: AC
  Administered 2024-06-24: 10 mg via INTRAVENOUS
  Filled 2024-06-24: qty 1

## 2024-06-24 NOTE — Progress Notes (Signed)
 Ok to proceed with HR 112  V.O. Dr Theadore Molt, PharmD

## 2024-06-24 NOTE — Patient Instructions (Signed)
 [facility] [place] - A DEPT OF [place]  Discharge Instructions: Thank you for choosing [facility] to provide your oncology and hematology care.  If you have a lab appointment with the Cancer Center - please note that after April 8th, 2024, all labs will be drawn in the cancer center.  You do not have to check in or register with the main entrance as you have in the past but will complete your check-in in the cancer center.  Wear comfortable clothing and clothing appropriate for easy access to any Portacath or PICC line.   We strive to give you quality time with your provider. You may need to reschedule your appointment if you arrive late (15 or more minutes).  Arriving late affects you and other patients whose appointments are after yours.  Also, if you miss three or more appointments without notifying the office, you may be dismissed from the clinic at the provider's discretion.      For prescription refill requests, have your pharmacy contact our office and allow 72 hours for refills to be completed.    Today you received the following chemotherapy and/or immunotherapy agents Bendeka . Bendamustine  Injection What is this medication? BENDAMUSTINE  (BEN da MUS teen) treats leukemia and lymphoma. It works by slowing down the growth of cancer cells. This medicine may be used for other purposes; ask your health care provider or pharmacist if you have questions. COMMON BRAND NAME(S): BELRAPZO , BENDEKA , Treanda , VIVIMUSTA  What should I tell my care team before I take this medication? They need to know if you have any of these conditions: Infection, especially a viral infection, such as chickenpox, cold sores, herpes Kidney disease Liver disease An unusual or allergic reaction to bendamustine , mannitol, other medications, foods, dyes, or preservatives Pregnant or trying to get pregnant Breast-feeding How should I use this medication? This medication is  injected into a vein. It is given by your care team in a hospital or clinic setting. Talk to your care team about the use of this medication in children. Special care may be needed. Overdosage: If you think you have taken too much of this medicine contact a poison control center or emergency room at once. NOTE: This medicine is only for you. Do not share this medicine with others. What if I miss a dose? Keep appointments for follow-up doses. It is important not to miss your dose. Call your care team if you are unable to keep an appointment. What may interact with this medication? Do not take this medication with any of the following: Clozapine This medication may also interact with the following: Atazanavir Cimetidine Ciprofloxacin  Enoxacin Fluvoxamine Medications for seizures, such as carbamazepine, phenobarbital Mexiletine Rifampin Tacrine Thiabendazole Zileuton This list may not describe all possible interactions. Give your health care provider a list of all the medicines, herbs, non-prescription drugs, or dietary supplements you use. Also tell them if you smoke, drink alcohol, or use illegal drugs. Some items may interact with your medicine. What should I watch for while using this medication? Visit your care team for regular checks on your progress. This medication may make you feel generally unwell. This is not uncommon, as chemotherapy can affect healthy cells as well as cancer cells. Report any side effects. Continue your course of treatment even though you feel ill unless your care team tells you to stop. You may need blood work while taking this medication. This medication may increase your risk of getting an infection. Call  your care team for advice if you get a fever, chills, sore throat, or other symptoms of a cold or flu. Do not treat yourself. Try to avoid being around people who are sick. This medication may cause serious skin reactions. They can happen weeks to months after  starting the medication. Contact your care team right away if you notice fevers or flu-like symptoms with a rash. The rash may be red or purple and then turn into blisters or peeling of the skin. You may also notice a red rash with swelling of the face, lips, or lymph nodes in your neck or under your arms. In some patients, this medication may cause a serious brain infection that may cause death. If you have any problems seeing, thinking, speaking, walking, or standing, tell your care team right away. If you cannot reach your care team, urgently seek other source of medical care. This medication may increase your risk to bruise or bleed. Call your care team if you notice any unusual bleeding. Talk to your care team about your risk of cancer. You may be more at risk for certain types of cancer if you take this medication. Talk to your care team about your risk of skin cancer. You may be more at risk for skin cancer if you take this medication. Talk to your care team if you or your partner wish to become pregnant or think either of you might be pregnant. This medication can cause serious birth defects if taken during pregnancy or for up to 6 months after the last dose. A negative pregnancy test is required before starting this medication. A reliable form of contraception is recommended while taking this medication and for 6 months after the last dose. Talk to your care team about reliable forms of contraception. Wear a condom while taking this medication and for at least 3 months after the last dose. Do not breast-feed while taking this medication or for at least 1 week after the last dose. This medication may cause infertility. Talk to your care team if you are concerned about your fertility. What side effects may I notice from receiving this medication? Side effects that you should report to your care team as soon as possible: Allergic reactions--skin rash, itching, hives, swelling of the face, lips,  tongue, or throat Infection--fever, chills, cough, sore throat, wounds that don't heal, pain or trouble when passing urine, general feeling of discomfort or being unwell Infusion reactions--chest pain, shortness of breath or trouble breathing, feeling faint or lightheaded Liver injury--right upper belly pain, loss of appetite, nausea, light-colored stool, dark yellow or brown urine, yellowing skin or eyes, unusual weakness or fatigue Low red blood cell level--unusual weakness or fatigue, dizziness, headache, trouble breathing Painful swelling, warmth, or redness of the skin, blisters or sores at the infusion site Rash, fever, and swollen lymph nodes Redness, blistering, peeling, or loosening of the skin, including inside the mouth Tumor lysis syndrome (TLS)--nausea, vomiting, diarrhea, decrease in the amount of urine, dark urine, unusual weakness or fatigue, confusion, muscle pain or cramps, fast or irregular heartbeat, joint pain Unusual bruising or bleeding Side effects that usually do not require medical attention (report to your care team if they continue or are bothersome): Diarrhea Fatigue Headache Loss of appetite Nausea Vomiting This list may not describe all possible side effects. Call your doctor for medical advice about side effects. You may report side effects to FDA at 1-800-FDA-1088. Where should I keep my medication? This medication is given in a  hospital or clinic. It will not be stored at home. NOTE: This sheet is a summary. It may not cover all possible information. If you have questions about this medicine, talk to your doctor, pharmacist, or health care provider.  2024 Elsevier/Gold Standard (2022-03-28 00:00:00)      To help prevent nausea and vomiting after your treatment, we encourage you to take your nausea medication as directed.  BELOW ARE SYMPTOMS THAT SHOULD BE REPORTED IMMEDIATELY: *FEVER GREATER THAN 100.4 F (38 C) OR HIGHER *CHILLS OR SWEATING *NAUSEA AND  VOMITING THAT IS NOT CONTROLLED WITH YOUR NAUSEA MEDICATION *UNUSUAL SHORTNESS OF BREATH *UNUSUAL BRUISING OR BLEEDING *URINARY PROBLEMS (pain or burning when urinating, or frequent urination) *BOWEL PROBLEMS (unusual diarrhea, constipation, pain near the anus) TENDERNESS IN MOUTH AND THROAT WITH OR WITHOUT PRESENCE OF ULCERS (sore throat, sores in mouth, or a toothache) UNUSUAL RASH, SWELLING OR PAIN  UNUSUAL VAGINAL DISCHARGE OR ITCHING   Items with * indicate a potential emergency and should be followed up as soon as possible or go to the Emergency Department if any problems should occur.  Please show the CHEMOTHERAPY ALERT CARD or IMMUNOTHERAPY ALERT CARD at check-in to the Emergency Department and triage nurse.  Should you have questions after your visit or need to cancel or reschedule your appointment, please contact [facility] [place] - A DEPT OF [facility] [phone]  and follow the prompts.  Office hours are 8:00 a.m. to 4:30 p.m. Monday - Friday. Please note that voicemails left after 4:00 p.m. may not be returned until the following business day.  We are closed weekends and major holidays. You have access to a nurse at all times for urgent questions. Please call the main number to the clinic [phone] and follow the prompts.  For any non-urgent questions, you may also contact your provider using MyChart. We now offer e-Visits for anyone 49 and older to request care online for non-urgent symptoms. For details visit [email or web address].   Also download the MyChart app! Go to the app store, search "MyChart", open the app, select [place], and log in with your MyChart username and password.

## 2024-06-24 NOTE — Progress Notes (Signed)
 Patient presents today for chemotherapy infusion.  Patient is in satisfactory condition with no new complaints voiced.  Vital signs are stable.  Labs from 06/23/24 reviewed. We will proceed with treatment per MD orders.    Patient tolerated treatment well with no complaints voiced.  Patient left ambulatory in stable condition.  Vital signs stable at discharge.  Follow up as scheduled.

## 2024-06-25 ENCOUNTER — Other Ambulatory Visit: Payer: Self-pay | Admitting: "Endocrinology

## 2024-06-25 ENCOUNTER — Inpatient Hospital Stay

## 2024-06-25 VITALS — BP 108/59 | HR 108 | Temp 97.0°F | Resp 20

## 2024-06-25 DIAGNOSIS — Z5112 Encounter for antineoplastic immunotherapy: Secondary | ICD-10-CM | POA: Diagnosis not present

## 2024-06-25 DIAGNOSIS — E2740 Unspecified adrenocortical insufficiency: Secondary | ICD-10-CM

## 2024-06-25 DIAGNOSIS — C8259 Diffuse follicle center lymphoma, extranodal and solid organ sites: Secondary | ICD-10-CM

## 2024-06-25 MED ORDER — PEGFILGRASTIM-FPGK 6 MG/0.6ML ~~LOC~~ SOSY
6.0000 mg | PREFILLED_SYRINGE | Freq: Once | SUBCUTANEOUS | Status: AC
  Administered 2024-06-25: 6 mg via SUBCUTANEOUS
  Filled 2024-06-25: qty 0.6

## 2024-06-25 NOTE — Progress Notes (Signed)
 STimufend  injection given per orders. Patient tolerated it well without problems. Vitals stable and discharged home from clinic ambulatory. Follow up as scheduled.

## 2024-06-25 NOTE — Patient Instructions (Addendum)
 [facility] [place] - A DEPT OF [facility]  Discharge Instructions: Thank you for choosing [facility] to provide your oncology and hematology care.  If you have a lab appointment with the Cancer Center - please note that after April 8th, 2024, all labs will be drawn in the cancer center.  You do not have to check in or register with the main entrance as you have in the past but will complete your check-in in the cancer center.  Wear comfortable clothing and clothing appropriate for easy access to any Portacath or PICC line.   We strive to give you quality time with your provider. You may need to reschedule your appointment if you arrive late (15 or more minutes).  Arriving late affects you and other patients whose appointments are after yours.  Also, if you miss three or more appointments without notifying the office, you may be dismissed from the clinic at the provider's discretion.      For prescription refill requests, have your pharmacy contact our office and allow 72 hours for refills to be completed.    Today you received the following injection: Stimufend   To help prevent nausea and vomiting after your treatment, we encourage you to take your nausea medication as directed.  BELOW ARE SYMPTOMS THAT SHOULD BE REPORTED IMMEDIATELY: *FEVER GREATER THAN 100.4 F (38 C) OR HIGHER *CHILLS OR SWEATING *NAUSEA AND VOMITING THAT IS NOT CONTROLLED WITH YOUR NAUSEA MEDICATION *UNUSUAL SHORTNESS OF BREATH *UNUSUAL BRUISING OR BLEEDING *URINARY PROBLEMS (pain or burning when urinating, or frequent urination) *BOWEL PROBLEMS (unusual diarrhea, constipation, pain near the anus) TENDERNESS IN MOUTH AND THROAT WITH OR WITHOUT PRESENCE OF ULCERS (sore throat, sores in mouth, or a toothache) UNUSUAL RASH, SWELLING OR PAIN  UNUSUAL VAGINAL DISCHARGE OR ITCHING   Items with * indicate a potential emergency and should be followed up as soon as possible or go to the  Emergency Department if any problems should occur.  Please show the CHEMOTHERAPY ALERT CARD or IMMUNOTHERAPY ALERT CARD at check-in to the Emergency Department and triage nurse.  Should you have questions after your visit or need to cancel or reschedule your appointment, please contact [facility] [place] - A DEPT OF [facility] [phone]  and follow the prompts.  Office hours are 8:00 a.m. to 4:30 p.m. Monday - Friday. Please note that voicemails left after 4:00 p.m. may not be returned until the following business day.  We are closed weekends and major holidays. You have access to a nurse at all times for urgent questions. Please call the main number to the clinic [phone] and follow the prompts.  For any non-urgent questions, you may also contact your provider using MyChart. We now offer e-Visits for anyone 40 and older to request care online for non-urgent symptoms. For details visit [email or web address].   Also download the MyChart app! Go to the app store, search "MyChart", open the app, select [facility], and log in with your MyChart username and password.

## 2024-07-18 DEATH — deceased

## 2024-07-21 ENCOUNTER — Inpatient Hospital Stay

## 2024-07-21 ENCOUNTER — Inpatient Hospital Stay: Admitting: Hematology

## 2024-07-22 ENCOUNTER — Inpatient Hospital Stay

## 2024-07-23 ENCOUNTER — Inpatient Hospital Stay

## 2024-08-04 ENCOUNTER — Ambulatory Visit: Payer: Medicare Other | Admitting: "Endocrinology
